# Patient Record
Sex: Female | Born: 1945 | Race: White | Hispanic: No | Smoking: Current every day smoker
Health system: Southern US, Community
[De-identification: ages and names within clinical notes are randomized; demographics above are authoritative.]

## PROBLEM LIST (undated history)

## (undated) DIAGNOSIS — M549 Dorsalgia, unspecified: Secondary | ICD-10-CM

## (undated) DIAGNOSIS — J42 Unspecified chronic bronchitis: Secondary | ICD-10-CM

## (undated) DIAGNOSIS — G8929 Other chronic pain: Secondary | ICD-10-CM

## (undated) DIAGNOSIS — M1990 Unspecified osteoarthritis, unspecified site: Secondary | ICD-10-CM

## (undated) DIAGNOSIS — R350 Frequency of micturition: Secondary | ICD-10-CM

## (undated) DIAGNOSIS — M797 Fibromyalgia: Secondary | ICD-10-CM

## (undated) DIAGNOSIS — IMO0002 Reserved for concepts with insufficient information to code with codable children: Secondary | ICD-10-CM

## (undated) DIAGNOSIS — Z9289 Personal history of other medical treatment: Secondary | ICD-10-CM

## (undated) DIAGNOSIS — F32A Depression, unspecified: Secondary | ICD-10-CM

## (undated) DIAGNOSIS — J449 Chronic obstructive pulmonary disease, unspecified: Secondary | ICD-10-CM

## (undated) DIAGNOSIS — G4700 Insomnia, unspecified: Secondary | ICD-10-CM

## (undated) DIAGNOSIS — M542 Cervicalgia: Secondary | ICD-10-CM

## (undated) DIAGNOSIS — I10 Essential (primary) hypertension: Secondary | ICD-10-CM

## (undated) DIAGNOSIS — E039 Hypothyroidism, unspecified: Secondary | ICD-10-CM

## (undated) DIAGNOSIS — Z87440 Personal history of urinary (tract) infections: Secondary | ICD-10-CM

## (undated) DIAGNOSIS — E119 Type 2 diabetes mellitus without complications: Secondary | ICD-10-CM

## (undated) DIAGNOSIS — J410 Simple chronic bronchitis: Secondary | ICD-10-CM

## (undated) DIAGNOSIS — U099 Post covid-19 condition, unspecified: Secondary | ICD-10-CM

## (undated) DIAGNOSIS — G629 Polyneuropathy, unspecified: Secondary | ICD-10-CM

## (undated) DIAGNOSIS — J189 Pneumonia, unspecified organism: Secondary | ICD-10-CM

## (undated) DIAGNOSIS — E785 Hyperlipidemia, unspecified: Secondary | ICD-10-CM

## (undated) DIAGNOSIS — F329 Major depressive disorder, single episode, unspecified: Secondary | ICD-10-CM

## (undated) DIAGNOSIS — M329 Systemic lupus erythematosus, unspecified: Secondary | ICD-10-CM

## (undated) DIAGNOSIS — J45909 Unspecified asthma, uncomplicated: Secondary | ICD-10-CM

## (undated) HISTORY — PX: ESOPHAGOGASTRODUODENOSCOPY: SHX1529

## (undated) HISTORY — PX: MOUTH SURGERY: SHX715

## (undated) HISTORY — DX: Post covid-19 condition, unspecified: U09.9

## (undated) HISTORY — PX: COLONOSCOPY: SHX174

## (undated) HISTORY — PX: CATARACT EXTRACTION: SUR2

---
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
## 1972-03-05 HISTORY — PX: BLADDER SUSPENSION: SHX72

## 1972-03-05 HISTORY — PX: VAGINAL HYSTERECTOMY: SUR661

## 1975-03-06 HISTORY — PX: BLADDER SURGERY: SHX569

## 1978-11-04 HISTORY — PX: BUNIONECTOMY: SHX129

## 1998-03-05 HISTORY — PX: RECTOCELE REPAIR: SHX761

## 1998-04-21 ENCOUNTER — Encounter: Payer: Self-pay | Admitting: Emergency Medicine

## 1998-04-21 ENCOUNTER — Emergency Department (HOSPITAL_COMMUNITY): Admission: EM | Admit: 1998-04-21 | Discharge: 1998-04-21 | Payer: Self-pay | Admitting: Emergency Medicine

## 1998-05-18 ENCOUNTER — Inpatient Hospital Stay (HOSPITAL_COMMUNITY): Admission: EM | Admit: 1998-05-18 | Discharge: 1998-05-20 | Payer: Self-pay | Admitting: Emergency Medicine

## 1998-05-18 ENCOUNTER — Encounter: Payer: Self-pay | Admitting: Emergency Medicine

## 1999-03-06 HISTORY — PX: BACK SURGERY: SHX140

## 2000-04-04 ENCOUNTER — Other Ambulatory Visit: Admission: RE | Admit: 2000-04-04 | Discharge: 2000-04-04 | Payer: Self-pay | Admitting: Gynecology

## 2000-06-05 ENCOUNTER — Encounter: Payer: Self-pay | Admitting: Internal Medicine

## 2000-06-05 ENCOUNTER — Encounter: Admission: RE | Admit: 2000-06-05 | Discharge: 2000-06-05 | Payer: Self-pay | Admitting: Internal Medicine

## 2000-06-27 ENCOUNTER — Observation Stay (HOSPITAL_COMMUNITY): Admission: RE | Admit: 2000-06-27 | Discharge: 2000-06-28 | Payer: Self-pay | Admitting: Neurosurgery

## 2000-08-02 ENCOUNTER — Encounter: Admission: RE | Admit: 2000-08-02 | Discharge: 2000-08-02 | Payer: Self-pay | Admitting: Neurosurgery

## 2000-10-04 ENCOUNTER — Encounter: Admission: RE | Admit: 2000-10-04 | Discharge: 2000-10-04 | Payer: Self-pay | Admitting: Neurosurgery

## 2000-10-14 ENCOUNTER — Ambulatory Visit (HOSPITAL_COMMUNITY): Admission: RE | Admit: 2000-10-14 | Discharge: 2000-10-14 | Payer: Self-pay | Admitting: Neurosurgery

## 2002-01-12 ENCOUNTER — Observation Stay (HOSPITAL_COMMUNITY): Admission: RE | Admit: 2002-01-12 | Discharge: 2002-01-13 | Payer: Self-pay | Admitting: Specialist

## 2002-01-12 ENCOUNTER — Encounter: Payer: Self-pay | Admitting: Specialist

## 2002-02-04 ENCOUNTER — Encounter: Admission: RE | Admit: 2002-02-04 | Discharge: 2002-05-05 | Payer: Self-pay | Admitting: Internal Medicine

## 2004-03-05 ENCOUNTER — Emergency Department (HOSPITAL_COMMUNITY): Admission: EM | Admit: 2004-03-05 | Discharge: 2004-03-05 | Payer: Self-pay | Admitting: Emergency Medicine

## 2004-03-25 ENCOUNTER — Emergency Department (HOSPITAL_COMMUNITY): Admission: EM | Admit: 2004-03-25 | Discharge: 2004-03-25 | Payer: Self-pay | Admitting: Emergency Medicine

## 2005-11-23 ENCOUNTER — Encounter: Admission: RE | Admit: 2005-11-23 | Discharge: 2005-11-23 | Payer: Self-pay | Admitting: Internal Medicine

## 2005-12-12 ENCOUNTER — Encounter: Admission: RE | Admit: 2005-12-12 | Discharge: 2005-12-12 | Payer: Self-pay | Admitting: Internal Medicine

## 2006-03-05 HISTORY — PX: CARDIAC CATHETERIZATION: SHX172

## 2006-04-26 ENCOUNTER — Emergency Department (HOSPITAL_COMMUNITY): Admission: EM | Admit: 2006-04-26 | Discharge: 2006-04-26 | Payer: Self-pay | Admitting: Emergency Medicine

## 2006-05-23 ENCOUNTER — Ambulatory Visit (HOSPITAL_COMMUNITY): Admission: RE | Admit: 2006-05-23 | Discharge: 2006-05-23 | Payer: Self-pay | Admitting: Gastroenterology

## 2006-06-20 ENCOUNTER — Ambulatory Visit (HOSPITAL_COMMUNITY): Admission: RE | Admit: 2006-06-20 | Discharge: 2006-06-20 | Payer: Self-pay | Admitting: Gastroenterology

## 2006-09-07 ENCOUNTER — Ambulatory Visit: Payer: Self-pay | Admitting: Cardiology

## 2006-09-07 ENCOUNTER — Inpatient Hospital Stay (HOSPITAL_COMMUNITY): Admission: EM | Admit: 2006-09-07 | Discharge: 2006-09-09 | Payer: Self-pay | Admitting: Emergency Medicine

## 2006-09-24 ENCOUNTER — Ambulatory Visit: Payer: Self-pay | Admitting: Internal Medicine

## 2006-10-01 ENCOUNTER — Ambulatory Visit: Payer: Self-pay | Admitting: Cardiology

## 2006-11-27 ENCOUNTER — Encounter: Admission: RE | Admit: 2006-11-27 | Discharge: 2006-11-27 | Payer: Self-pay | Admitting: Internal Medicine

## 2007-02-01 ENCOUNTER — Inpatient Hospital Stay (HOSPITAL_COMMUNITY): Admission: EM | Admit: 2007-02-01 | Discharge: 2007-02-03 | Payer: Self-pay | Admitting: Emergency Medicine

## 2007-03-06 HISTORY — PX: LUMBAR FUSION: SHX111

## 2007-03-27 ENCOUNTER — Ambulatory Visit: Payer: Self-pay | Admitting: Internal Medicine

## 2007-04-22 ENCOUNTER — Ambulatory Visit (HOSPITAL_COMMUNITY): Admission: RE | Admit: 2007-04-22 | Discharge: 2007-04-22 | Payer: Self-pay | Admitting: Neurosurgery

## 2007-05-07 ENCOUNTER — Inpatient Hospital Stay (HOSPITAL_COMMUNITY): Admission: RE | Admit: 2007-05-07 | Discharge: 2007-05-10 | Payer: Self-pay | Admitting: Neurosurgery

## 2007-11-28 ENCOUNTER — Encounter: Admission: RE | Admit: 2007-11-28 | Discharge: 2007-11-28 | Payer: Self-pay | Admitting: Internal Medicine

## 2008-06-07 ENCOUNTER — Inpatient Hospital Stay (HOSPITAL_COMMUNITY): Admission: EM | Admit: 2008-06-07 | Discharge: 2008-06-09 | Payer: Self-pay | Admitting: Emergency Medicine

## 2008-06-14 ENCOUNTER — Ambulatory Visit: Payer: Self-pay | Admitting: Pulmonary Disease

## 2008-06-14 DIAGNOSIS — E785 Hyperlipidemia, unspecified: Secondary | ICD-10-CM | POA: Insufficient documentation

## 2008-06-14 DIAGNOSIS — J309 Allergic rhinitis, unspecified: Secondary | ICD-10-CM | POA: Insufficient documentation

## 2008-06-14 DIAGNOSIS — I10 Essential (primary) hypertension: Secondary | ICD-10-CM | POA: Insufficient documentation

## 2008-06-14 DIAGNOSIS — J209 Acute bronchitis, unspecified: Secondary | ICD-10-CM | POA: Insufficient documentation

## 2008-06-14 DIAGNOSIS — E119 Type 2 diabetes mellitus without complications: Secondary | ICD-10-CM | POA: Insufficient documentation

## 2008-06-14 DIAGNOSIS — M329 Systemic lupus erythematosus, unspecified: Secondary | ICD-10-CM | POA: Insufficient documentation

## 2008-06-25 DIAGNOSIS — D72829 Elevated white blood cell count, unspecified: Secondary | ICD-10-CM | POA: Insufficient documentation

## 2008-06-25 DIAGNOSIS — J449 Chronic obstructive pulmonary disease, unspecified: Secondary | ICD-10-CM | POA: Insufficient documentation

## 2008-06-25 DIAGNOSIS — K3184 Gastroparesis: Secondary | ICD-10-CM | POA: Insufficient documentation

## 2008-06-25 DIAGNOSIS — J4489 Other specified chronic obstructive pulmonary disease: Secondary | ICD-10-CM | POA: Insufficient documentation

## 2008-07-13 ENCOUNTER — Encounter: Payer: Self-pay | Admitting: Pulmonary Disease

## 2008-08-05 ENCOUNTER — Ambulatory Visit: Payer: Self-pay | Admitting: Pulmonary Disease

## 2008-08-05 ENCOUNTER — Telehealth (INDEPENDENT_AMBULATORY_CARE_PROVIDER_SITE_OTHER): Payer: Self-pay | Admitting: *Deleted

## 2008-08-05 DIAGNOSIS — J438 Other emphysema: Secondary | ICD-10-CM | POA: Insufficient documentation

## 2008-11-24 ENCOUNTER — Ambulatory Visit (HOSPITAL_COMMUNITY): Admission: RE | Admit: 2008-11-24 | Discharge: 2008-11-24 | Payer: Self-pay | Admitting: Neurosurgery

## 2008-12-08 ENCOUNTER — Encounter: Admission: RE | Admit: 2008-12-08 | Discharge: 2008-12-08 | Payer: Self-pay | Admitting: Internal Medicine

## 2009-08-02 ENCOUNTER — Ambulatory Visit (HOSPITAL_COMMUNITY)
Admission: RE | Admit: 2009-08-02 | Discharge: 2009-08-02 | Payer: Self-pay | Source: Home / Self Care | Admitting: Neurosurgery

## 2010-03-05 DIAGNOSIS — J189 Pneumonia, unspecified organism: Secondary | ICD-10-CM

## 2010-03-05 HISTORY — DX: Pneumonia, unspecified organism: J18.9

## 2010-04-13 ENCOUNTER — Inpatient Hospital Stay (HOSPITAL_COMMUNITY)
Admission: EM | Admit: 2010-04-13 | Discharge: 2010-04-15 | DRG: 192 | Disposition: A | Discharge status: Home / Self Care | Payer: Medicare Other | Attending: Internal Medicine | Admitting: Internal Medicine

## 2010-04-13 ENCOUNTER — Emergency Department (HOSPITAL_COMMUNITY): Payer: Medicare Other

## 2010-04-13 DIAGNOSIS — M329 Systemic lupus erythematosus, unspecified: Secondary | ICD-10-CM | POA: Diagnosis present

## 2010-04-13 DIAGNOSIS — F17200 Nicotine dependence, unspecified, uncomplicated: Secondary | ICD-10-CM | POA: Diagnosis present

## 2010-04-13 DIAGNOSIS — J209 Acute bronchitis, unspecified: Principal | ICD-10-CM | POA: Diagnosis present

## 2010-04-13 DIAGNOSIS — J440 Chronic obstructive pulmonary disease with acute lower respiratory infection: Principal | ICD-10-CM | POA: Diagnosis present

## 2010-04-13 DIAGNOSIS — Z888 Allergy status to other drugs, medicaments and biological substances status: Secondary | ICD-10-CM

## 2010-04-13 DIAGNOSIS — E039 Hypothyroidism, unspecified: Secondary | ICD-10-CM | POA: Diagnosis present

## 2010-04-13 DIAGNOSIS — E119 Type 2 diabetes mellitus without complications: Secondary | ICD-10-CM | POA: Diagnosis present

## 2010-04-13 DIAGNOSIS — E785 Hyperlipidemia, unspecified: Secondary | ICD-10-CM | POA: Diagnosis present

## 2010-04-13 DIAGNOSIS — Z880 Allergy status to penicillin: Secondary | ICD-10-CM

## 2010-04-13 LAB — GLUCOSE, CAPILLARY
Glucose-Capillary: 249 mg/dL — ABNORMAL HIGH (ref 70–99)
Glucose-Capillary: 285 mg/dL — ABNORMAL HIGH (ref 70–99)

## 2010-04-13 LAB — CBC
HCT: 42.1 % (ref 36.0–46.0)
Hemoglobin: 14.7 g/dL (ref 12.0–15.0)
MCH: 31.4 pg (ref 26.0–34.0)
MCHC: 34.9 g/dL (ref 30.0–36.0)
MCV: 90 fL (ref 78.0–100.0)
Platelets: 244 10*3/uL (ref 150–400)
RBC: 4.68 MIL/uL (ref 3.87–5.11)
RDW: 12.8 % (ref 11.5–15.5)
WBC: 9.7 10*3/uL (ref 4.0–10.5)

## 2010-04-13 LAB — BASIC METABOLIC PANEL
BUN: 14 mg/dL (ref 6–23)
CO2: 28 mEq/L (ref 19–32)
Calcium: 9.1 mg/dL (ref 8.4–10.5)
Chloride: 97 mEq/L (ref 96–112)
Creatinine, Ser: 0.87 mg/dL (ref 0.4–1.2)
GFR calc Af Amer: >60 mL/min (ref >60)
GFR calc non Af Amer: >60 mL/min (ref >60)
Glucose, Bld: 196 mg/dL — ABNORMAL HIGH (ref 70–99)
Potassium: 3.8 mEq/L (ref 3.5–5.1)
Sodium: 136 mEq/L (ref 135–145)

## 2010-04-13 LAB — DIFFERENTIAL
Basophils Absolute: 0.1 10*3/uL (ref 0.0–0.1)
Basophils Relative: 1 % (ref 0–1)
Eosinophils Absolute: 0.3 10*3/uL (ref 0.0–0.7)
Eosinophils Relative: 3 % (ref 0–5)
Lymphocytes Relative: 21 % (ref 12–46)
Lymphs Abs: 2.1 10*3/uL (ref 0.7–4.0)
Monocytes Absolute: 0.4 10*3/uL (ref 0.1–1.0)
Monocytes Relative: 4 % (ref 3–12)
Neutro Abs: 6.8 10*3/uL (ref 1.7–7.7)
Neutrophils Relative %: 71 % (ref 43–77)

## 2010-04-14 LAB — DIFFERENTIAL
Basophils Absolute: 0 10*3/uL (ref 0.0–0.1)
Basophils Relative: 0 % (ref 0–1)
Eosinophils Absolute: 0 10*3/uL (ref 0.0–0.7)
Eosinophils Relative: 0 % (ref 0–5)
Lymphocytes Relative: 5 % — ABNORMAL LOW (ref 12–46)
Lymphs Abs: 0.8 10*3/uL (ref 0.7–4.0)
Monocytes Absolute: 0.2 10*3/uL (ref 0.1–1.0)
Monocytes Relative: 1 % — ABNORMAL LOW (ref 3–12)
Neutro Abs: 14.9 10*3/uL — ABNORMAL HIGH (ref 1.7–7.7)
Neutrophils Relative %: 94 % — ABNORMAL HIGH (ref 43–77)

## 2010-04-14 LAB — CBC
HCT: 38.4 % (ref 36.0–46.0)
Hemoglobin: 13.6 g/dL (ref 12.0–15.0)
MCH: 31.9 pg (ref 26.0–34.0)
MCHC: 35.4 g/dL (ref 30.0–36.0)
MCV: 90.1 fL (ref 78.0–100.0)
Platelets: 197 10*3/uL (ref 150–400)
RBC: 4.26 MIL/uL (ref 3.87–5.11)
RDW: 12.9 % (ref 11.5–15.5)
WBC: 15.9 10*3/uL — ABNORMAL HIGH (ref 4.0–10.5)

## 2010-04-14 LAB — BASIC METABOLIC PANEL
BUN: 16 mg/dL (ref 6–23)
CO2: 22 mEq/L (ref 19–32)
Calcium: 9.1 mg/dL (ref 8.4–10.5)
Chloride: 100 mEq/L (ref 96–112)
Creatinine, Ser: 0.92 mg/dL (ref 0.4–1.2)
GFR calc Af Amer: >60 mL/min (ref >60)
GFR calc non Af Amer: >60 mL/min (ref >60)
Glucose, Bld: 226 mg/dL — ABNORMAL HIGH (ref 70–99)
Potassium: 4.6 mEq/L (ref 3.5–5.1)
Sodium: 135 mEq/L (ref 135–145)

## 2010-04-14 LAB — GLUCOSE, CAPILLARY
Glucose-Capillary: 227 mg/dL — ABNORMAL HIGH (ref 70–99)
Glucose-Capillary: 149 mg/dL — ABNORMAL HIGH (ref 70–99)
Glucose-Capillary: 118 mg/dL — ABNORMAL HIGH (ref 70–99)
Glucose-Capillary: 191 mg/dL — ABNORMAL HIGH (ref 70–99)

## 2010-04-14 LAB — HEMOGLOBIN A1C
Hgb A1c MFr Bld: 6.4 % — ABNORMAL HIGH (ref <5.7)
Mean Plasma Glucose: 137 mg/dL — ABNORMAL HIGH (ref <117)

## 2010-04-15 LAB — BASIC METABOLIC PANEL
BUN: 21 mg/dL (ref 6–23)
CO2: 27 mEq/L (ref 19–32)
Calcium: 9 mg/dL (ref 8.4–10.5)
Chloride: 99 mEq/L (ref 96–112)
Creatinine, Ser: 0.86 mg/dL (ref 0.4–1.2)
GFR calc Af Amer: >60 mL/min (ref >60)
GFR calc non Af Amer: >60 mL/min (ref >60)
Glucose, Bld: 187 mg/dL — ABNORMAL HIGH (ref 70–99)
Potassium: 4.4 mEq/L (ref 3.5–5.1)
Sodium: 137 mEq/L (ref 135–145)

## 2010-04-15 LAB — GLUCOSE, CAPILLARY: Glucose-Capillary: 190 mg/dL — ABNORMAL HIGH (ref 70–99)

## 2010-04-30 NOTE — Discharge Summary (Signed)
  [name], [name]          ACCOUNT NO.:  [id number]  MEDICAL RECORD NO.:  [id number]           PATIENT TYPE:  I  LOCATION:  A315                          FACILITY:  [facility]  PHYSICIAN:  Felise Georgia L. Lendell [name], MDDATE OF BIRTH:  Feb 07, 1946  DATE OF ADMISSION:  04/13/2010 DATE OF DISCHARGE:  02/11/2012LH                              DISCHARGE SUMMARY   DIAGNOSES: 1. Chronic obstructive pulmonary disease exacerbation secondary to     acute bronchitis. 2. Continued tobacco abuse, counseled against. 3. Type 2 diabetes. 4. Lupus. 5. Hyperlipidemia. 6. Hypothyroidism.  DISCHARGE MEDICATIONS: 1. Nicotine patch 21 mg daily then titrate as tolerated. 2. Doxycycline 100 mg p.o. b.i.d. until gone. 3. Methylprednisolone 4 mg tablets 6 tablets on day 1, then decreased     by 1 tablet daily until off. 4. Simvastatin 20 mg a day. 5. Janumet 50/1000 mg daily. 6. Triamterene/hydrochlorothiazide 37.5/25 mg daily. 7. Levothyroxine 112 mcg a day. 8. Sertraline 100 mg a day. 9. Zolpidem 5 mg nightly. 10.Hydromorphone 4 mg p.o. q.4 h. p.r.n. pain. 11.Etodolac 400 mg p.o. b.i.d. 12.Albuterol inhaler 1 puff q.4 h. p.r.n. wheezing. 13.Cefdinir 300 mg p.o. b.i.d. until gone.  CONDITION:  Stable.  ACTIVITY:  Increased slowly.  CONSULTATIONS:  None.  PROCEDURES:  None.  FOLLOWUP:  With Dr. Renae [name] if symptoms worsen.  DIET:  Should be diabetic heart-healthy.  LABORATORY DATA:  Sputum gram stain and culture were ordered, but have not been collected.  CBC normal.  Basic metabolic panel on admission significant for a glucose of 196, otherwise negative.  BNP 6.4.  DIAGNOSTICS:  Chest x-ray shows no infiltrate, mild hyperinflation.  HISTORY AND HOSPITAL COURSE:  Please see H and P for details.  [name] is a 65 year old smoker with lupus and COPD who was sent to the emergency room by her primary care physician for non improvement of her bronchitis and wheezing.  She has been on 2  courses of Avelox, 7 days each and she has bronchodilators at home.  She continues to smoke a pack of cigarettes a day and has tried Chantix and other measures in the past on successfully.  The patient had unremarkable vital signs.  She was able to speak in full sentences and was watching TV and comfortable on admission.  She did, however, have diffuse bronchospasm, but good air movement.  The patient was admitted as she had failed outpatient treatment and was started on Solu-Medrol, vancomycin and cefepime.  She reports multiple allergies including PREDNISONE.  The patient improved within a day and was transitioned to Medrol, doxycycline and Ceftin without any reactions.  She has been ambulating the halls.  She has completely clear lung sounds.  She does wish to quit smoking and we talked about nicotine replacement therapies.  She is discharged home in stable condition.     [name] Thorpe L. Lendell Caprice, MD     CLS/MEDQ  D:  04/15/2010  T:  04/15/2010  Job:  [id number]  cc:   [name], M.D. Fax: [phone]  Electronically Signed by Crista Curb MD on 04/30/2010 09:24:25 PM

## 2010-04-30 NOTE — H&P (Signed)
[name], [name]          ACCOUNT NO.:  [id number]  MEDICAL RECORD NO.:  [id number]           PATIENT TYPE:  I  LOCATION:  A315                          FACILITY:  [facility]  PHYSICIAN:  Adalei Novell L. Lendell Caprice, MDDATE OF BIRTH:  January 25, 1946  DATE OF ADMISSION:  04/13/2010 DATE OF DISCHARGE:  LH                             HISTORY & PHYSICAL   CHIEF COMPLAINT:  Bronchitis.  HISTORY OF PRESENT ILLNESS:  [name] is a 65 year old white female with a history of COPD who was instructed to come to the emergency room by her primary care physician.  She has been sick for several weeks. She had a regular checkup a few weeks ago, and had a cough and wheezing at that time.  She has multiple drug allergies and intolerances.  She was started on Avelox at that time.  She got a little better, then relapsed.  The second episode had sinusitis.  She was placed back on Avelox for another 7 days as well as an unknown nasal inhaledmedication.  She reports that her sinusitis is better but that she has had continued cough, shortness of breath, poor appetite, and malaise. She has a history of lupus.  She continues to smoke a pack of cigarettes a day.  She reports an intolerance to prednisone which causes muscle jerking.  She has had no fevers, chills, or rash.  PAST MEDICAL HISTORY:  COPD, diabetes, lupus, hyperlipidemia, hypertension, hypothyroidism, and reported history of gastroparesis.  MEDICATIONS:  According to the triage notes include Janumet 50/1000 mg twice a day, etodolac 400 mg p.o. b.i.d., levothyroxine 112 mcg a day, triamterene/hydrochlorothiazide 37.5/25 mg a day, simvastatin 20 mg a day, multivitamin a day, zolpidem 5 mg nightly, sertraline 100 mg a day, albuterol inhaler as needed, Dilaudid 4 mg as needed.  ALLERGIES:  She reports an allergy to PENICILLIN and ERYTHROMYCIN which causes shortness of breath.  Unknown reaction to SULFA.  Vomiting with CODEINE.  CONTRAST MEDIA causes  rash.  DIAZEPAM causes agitation. PREDNISONE causes muscle jerks.  ASPIRIN unknown reaction.  SOCIAL HISTORY:  The patient continues to smoke a pack of cigarettes a day.  She denies drug use or heavy drinking.  She lives in [place] with her husband.  She does not work currently.  FAMILY HISTORY:  Significant for an autoimmune disorder in her son, lupus in her aunt.  REVIEW OF SYSTEMS:  Systems reviewed and as above otherwise negative.  PHYSICAL EXAMINATION:  VITAL SIGNS:  Temperature is 98 degrees, blood pressure 107/58, pulse 61, respiratory rate 20, and oxygen saturation 98% on room air. GENERAL:  The patient is well-nourished, well-developed in no acute distress but with periods of wet cough.  She can speak in full sentences. HEENT:  Normocephalic and atraumatic.  Pupils equal, round, reactive to light.  Sclerae nonicteric.  Moist mucous membranes. NECK:  Supple.  She has this shoddy submandibular lymphadenopathy. LUNGS:  Prolonged expiratory phase.  Diffuse expiratory wheezes but good air movement. CARDIOVASCULAR:  Regular rate and rhythm without murmurs, gallops, or rubs. ABDOMEN:  Normal bowel sounds, soft, nontender, and nondistended. GU AND RECTAL:  Deferred. EXTREMITIES:  No clubbing, cyanosis, or edema. SKIN:  No rash. PSYCHIATRIC:  Normal affect. NEUROLOGIC:  Alert and oriented.  Cranial nerves and sensorimotor exam are intact.  LABORATORY DATA:  Labs, CBC is normal.  Basic metabolic panel is significant for a glucose of 196.  Chest x-ray shows hyperinflation and nothing acute.  ASSESSMENT/PLAN: 1. Acute bronchitis with chronic obstructive pulmonary disease     exacerbation, failed outpatient treatment:  The patient is     immunosuppressed from her lupus and is at risk for Pseudomonas or     MRSA.  I will place her on vancomycin and cefepime for now.  She     has received a bronchodilator and Solu-Medrol which I will     continue.  She looks relatively stable.   I will admit her to the     floor.  I will order sputum Gram stain and culture.  She is     encouraged to quit smoking.  She will get a nicotine patch. 2. Type 2 diabetes:  I will give sliding scale and monitor blood     glucose while on steroids.  Also check a hemoglobin A1c. 3. Hypertension.  Continue outpatient medications. 4. Lupus.  Continue etodolac. 5. Hyperlipidemia.  Continue simvastatin.     [name] L. Lendell Caprice, MD     CLS/MEDQ  D:  04/13/2010  T:  04/14/2010  Job:  [id number]  cc:   [name]. Renae Gloss, M.D. Fax: [phone]  Electronically Signed by Crista Curb MD on 04/30/2010 09:24:29 PM

## 2010-05-22 LAB — GLUCOSE, CAPILLARY
Glucose-Capillary: 139 mg/dL — ABNORMAL HIGH (ref 70–99)
Glucose-Capillary: 185 mg/dL — ABNORMAL HIGH (ref 70–99)

## 2010-06-09 LAB — CREATININE, SERUM
Creatinine, Ser: 0.69 mg/dL (ref 0.4–1.2)
GFR calc Af Amer: >60 mL/min (ref >60)
GFR calc non Af Amer: >60 mL/min (ref >60)

## 2010-06-14 LAB — BASIC METABOLIC PANEL
BUN: 16 mg/dL (ref 6–23)
BUN: 21 mg/dL (ref 6–23)
BUN: 23 mg/dL (ref 6–23)
CO2: 26 mEq/L (ref 19–32)
CO2: 26 mEq/L (ref 19–32)
CO2: 26 mEq/L (ref 19–32)
Calcium: 8.8 mg/dL (ref 8.4–10.5)
Calcium: 8.9 mg/dL (ref 8.4–10.5)
Calcium: 9.4 mg/dL (ref 8.4–10.5)
Chloride: 96 mEq/L (ref 96–112)
Chloride: 97 mEq/L (ref 96–112)
Chloride: 99 mEq/L (ref 96–112)
Creatinine, Ser: 0.72 mg/dL (ref 0.4–1.2)
Creatinine, Ser: 0.96 mg/dL (ref 0.4–1.2)
Creatinine, Ser: 1.28 mg/dL — ABNORMAL HIGH (ref 0.4–1.2)
GFR calc Af Amer: 51 mL/min — ABNORMAL LOW (ref >60)
GFR calc Af Amer: >60 mL/min (ref >60)
GFR calc Af Amer: >60 mL/min (ref >60)
GFR calc non Af Amer: 42 mL/min — ABNORMAL LOW (ref >60)
GFR calc non Af Amer: 59 mL/min — ABNORMAL LOW (ref >60)
GFR calc non Af Amer: >60 mL/min (ref >60)
Glucose, Bld: 106 mg/dL — ABNORMAL HIGH (ref 70–99)
Glucose, Bld: 286 mg/dL — ABNORMAL HIGH (ref 70–99)
Glucose, Bld: 300 mg/dL — ABNORMAL HIGH (ref 70–99)
Potassium: 3.3 mEq/L — ABNORMAL LOW (ref 3.5–5.1)
Potassium: 4.2 mEq/L (ref 3.5–5.1)
Potassium: 4.5 mEq/L (ref 3.5–5.1)
Sodium: 132 mEq/L — ABNORMAL LOW (ref 135–145)
Sodium: 134 mEq/L — ABNORMAL LOW (ref 135–145)
Sodium: 136 mEq/L (ref 135–145)

## 2010-06-14 LAB — DIFFERENTIAL
Basophils Absolute: 0 10*3/uL (ref 0.0–0.1)
Basophils Absolute: 0 10*3/uL (ref 0.0–0.1)
Basophils Absolute: 0 10*3/uL (ref 0.0–0.1)
Basophils Relative: 0 % (ref 0–1)
Basophils Relative: 0 % (ref 0–1)
Basophils Relative: 0 % (ref 0–1)
Eosinophils Absolute: 0 10*3/uL (ref 0.0–0.7)
Eosinophils Absolute: 0 10*3/uL (ref 0.0–0.7)
Eosinophils Absolute: 0.3 10*3/uL (ref 0.0–0.7)
Eosinophils Relative: 0 % (ref 0–5)
Eosinophils Relative: 0 % (ref 0–5)
Eosinophils Relative: 3 % (ref 0–5)
Lymphocytes Relative: 28 % (ref 12–46)
Lymphocytes Relative: 5 % — ABNORMAL LOW (ref 12–46)
Lymphocytes Relative: 6 % — ABNORMAL LOW (ref 12–46)
Lymphs Abs: 0.6 10*3/uL — ABNORMAL LOW (ref 0.7–4.0)
Lymphs Abs: 1 10*3/uL (ref 0.7–4.0)
Lymphs Abs: 2.5 10*3/uL (ref 0.7–4.0)
Monocytes Absolute: 0.1 10*3/uL (ref 0.1–1.0)
Monocytes Absolute: 0.4 10*3/uL (ref 0.1–1.0)
Monocytes Absolute: 0.7 10*3/uL (ref 0.1–1.0)
Monocytes Relative: 1 % — ABNORMAL LOW (ref 3–12)
Monocytes Relative: 2 % — ABNORMAL LOW (ref 3–12)
Monocytes Relative: 8 % (ref 3–12)
Neutro Abs: 10 10*3/uL — ABNORMAL HIGH (ref 1.7–7.7)
Neutro Abs: 18.6 10*3/uL — ABNORMAL HIGH (ref 1.7–7.7)
Neutro Abs: 5.5 10*3/uL (ref 1.7–7.7)
Neutrophils Relative %: 61 % (ref 43–77)
Neutrophils Relative %: 92 % — ABNORMAL HIGH (ref 43–77)
Neutrophils Relative %: 94 % — ABNORMAL HIGH (ref 43–77)

## 2010-06-14 LAB — CBC
HCT: 36.4 % (ref 36.0–46.0)
HCT: 36.9 % (ref 36.0–46.0)
HCT: 41 % (ref 36.0–46.0)
Hemoglobin: 12.9 g/dL (ref 12.0–15.0)
Hemoglobin: 13.1 g/dL (ref 12.0–15.0)
Hemoglobin: 14.5 g/dL (ref 12.0–15.0)
MCHC: 35.3 g/dL (ref 30.0–36.0)
MCHC: 35.5 g/dL (ref 30.0–36.0)
MCHC: 35.6 g/dL (ref 30.0–36.0)
MCV: 94.2 fL (ref 78.0–100.0)
MCV: 94.7 fL (ref 78.0–100.0)
MCV: 95.3 fL (ref 78.0–100.0)
Platelets: 211 10*3/uL (ref 150–400)
Platelets: 235 10*3/uL (ref 150–400)
Platelets: 293 10*3/uL (ref 150–400)
RBC: 3.82 MIL/uL — ABNORMAL LOW (ref 3.87–5.11)
RBC: 3.89 MIL/uL (ref 3.87–5.11)
RBC: 4.35 MIL/uL (ref 3.87–5.11)
RDW: 12.2 % (ref 11.5–15.5)
RDW: 12.4 % (ref 11.5–15.5)
RDW: 12.8 % (ref 11.5–15.5)
WBC: 10.7 10*3/uL — ABNORMAL HIGH (ref 4.0–10.5)
WBC: 20.2 10*3/uL — ABNORMAL HIGH (ref 4.0–10.5)
WBC: 9 10*3/uL (ref 4.0–10.5)

## 2010-06-14 LAB — GLUCOSE, CAPILLARY
Glucose-Capillary: 176 mg/dL — ABNORMAL HIGH (ref 70–99)
Glucose-Capillary: 196 mg/dL — ABNORMAL HIGH (ref 70–99)
Glucose-Capillary: 252 mg/dL — ABNORMAL HIGH (ref 70–99)
Glucose-Capillary: 264 mg/dL — ABNORMAL HIGH (ref 70–99)
Glucose-Capillary: 270 mg/dL — ABNORMAL HIGH (ref 70–99)
Glucose-Capillary: 284 mg/dL — ABNORMAL HIGH (ref 70–99)
Glucose-Capillary: 306 mg/dL — ABNORMAL HIGH (ref 70–99)
Glucose-Capillary: 88 mg/dL (ref 70–99)

## 2010-06-14 LAB — HEMOGLOBIN A1C
Hgb A1c MFr Bld: 6.2 % — ABNORMAL HIGH (ref 4.6–6.1)
Mean Plasma Glucose: 131 mg/dL

## 2010-07-18 NOTE — Assessment & Plan Note (Signed)
[facility]                            CARDIOLOGY OFFICE NOTE   NAME:[name], [name]                 MRN:          [id number]  DATE:03/27/2007                            DOB:          [birth year]    PRIMARY CARE PHYSICIAN:  [name], M.D.   INTERVAL HISTORY:  [name] is a very pleasant 65 -year-old woman  with a history diabetes, hypertension, hyperlipidemia, COPD with ongoing  tobacco use and gastroparesis.  She had a history of chest pain with  minimal coronary artery disease by catheterization.  This was last year.   She returns today routine follow-up.  Overall, she is doing fairly well.  Her main problem is back and neck pain and she is considering spinal  fusion.  She has not had any chest pain and her breathing is okay.  She  says that she is trying to quit smoking as of today.   CURRENT MEDICATIONS:  1. Domperidone.  2. Avandamet.  3. Sertraline.  4. Transferrin/hydrochlorothiazide.  5. Lipitor.  6. Advair.  7. Darvocet.   PHYSICAL EXAM:  She has no acute distress. Ambulates around the clinic  without respiratory difficulty.  Blood pressure is 132/78, heart rate  75, weight 159.  HEENT is normal.  NECK: Is supple.  There is no JVD.  Carotids are 2+ bilaterally without  any bruits.  There is no lymphadenopathy or thyromegaly.  CARDIAC:  PMI is nondisplaced.  She has a regular rate and rhythm.  No  murmurs, rubs or gallops.  LUNGS:  Clear with a prolonged expiratory phase, no wheeze.  ABDOMEN:  Soft, nontender, nondistended.  No hepatosplenomegaly, bruits  or masses appreciated.  EXTREMITIES: Warm with no cyanosis, clubbing or  edema.  No rash.  NEURO:  Alert and oriented x3.  Cranial nerves II-XII are intact.  Moves  all four extremities without difficulty.  Affect is pleasant.   ASSESSMENT/PLAN:  1. Chest pain with minimal nonobstructive coronary artery disease.      Would just continue risk factor  modification and once again      stressed absolute need for her to quit smoking.  2. Hypertension.  Blood pressure is mildly elevated today.  This was      followed by Dr. Renae [name].   DISPOSITION:  She can return to [place] on a p.r.n. basis.     Bevelyn Buckles. Bensimhon, MD  Electronically Signed    DRB/MedQ  DD: 03/27/2007  DT: 03/27/2007  Job #: [id number]   cc:   [name], M.D.

## 2010-07-18 NOTE — Group Therapy Note (Signed)
[name], [name]          ACCOUNT NO.:  [id number]   MEDICAL RECORD NO.:  [id number]          PATIENT TYPE:  INP   LOCATION:  A307                          FACILITY:  [facility]   PHYSICIAN:  Margaretmary Dys, M.D.DATE OF BIRTH:  [birth year]   DATE OF PROCEDURE:  06/08/2008  DATE OF DISCHARGE:                                 PROGRESS NOTE   SUBJECTIVE:  The patient feels slightly better today although she is  still having fair amount of cough productive of yellowish sputum.  She  has some chest pain but mostly due to coughing hard.  She denies any  fevers or chills at this time.  Sounds like she never really has any  fevers or chills.  She has a history of lupus erythematosus, SLE.   OBJECTIVE:  Conscious, alert, not in acute distress, pleasant.  VITAL SIGNS:  Blood pressure was 107/57, pulse of 77, respirations 20,  temperature 97 degrees Fahrenheit.  Oxygen saturation was 95% on room  air.  HEENT EXAM:  Normocephalic, atraumatic.  Oral mucosa was dry.  No  exudates were noted.  NECK:  Supple.  No JVD or lymphadenopathy.  LUNGS:  Showed bilateral diffuse wheezing with some rhonchi.  HEART:  S1, S2 regular.  No S3, S4, gallops or rubs.  ABDOMEN:  Soft, nontender.  Bowel sounds positive.  No masses palpable.  EXTREMITIES:  No edema.  No calf induration or tenderness was noted.  CNS:  Exam was grossly intact.  No focal neurological deficits.   LABORATORY/DIAGNOSTIC DATA:  White blood cell count 10.7, hemoglobin of  13, hematocrit 36.9, platelet count was 211 with 94% neutrophils.  Blood  sugars ranging between 88-52.  Sodium 134, potassium is 4.2, chloride of  99, CO2 was 26, glucose was 280, BUN of 21, creatinine was 1.28, calcium  is 8.9.   ASSESSMENT/PLAN:  1. Acute bronchitis but also suspect underlying chronic obstructive      pulmonary disease.  The patient has had frequent bronchitis and has      over a 50 pack year history of smoking.  2. Type 2 diabetes, poorly  controlled possibly from steroid therapy.  3. Hypertension.  4,  History of lupus.   PLAN:  1. We will continue on intravenous antibiotics with Avelox at this      time and also steroids and nebulizer therapy.  2. We will continue with sliding scale for her blood sugar control.      We will continue to follow up very closely.  3. Smoking cessation was again discussed with the patient.  4. We will continue all other current treatments.  Hopefully with some      improvement in her wheezing and      rhonchi, the patient may be discharged home in the next 1-2 days.  5. The patient remains on Domperidone for her gastroparesis.  This      drug is not available in the [place] and the patient is using      her own supplies.      Margaretmary Dys, M.D.  Electronically Signed     AM/MEDQ  D:  06/08/2008  T:  06/08/2008  Job:  [id number]

## 2010-07-18 NOTE — Op Note (Signed)
NAMEMARCEDES, TECH          ACCOUNT NO.:  [id number]   MEDICAL RECORD NO.:  [id number]          PATIENT TYPE:  INP   LOCATION:  3029                         FACILITY:  [facility]   PHYSICIAN:  Cristi Loron, M.D.DATE OF BIRTH:  1945-03-19   DATE OF PROCEDURE:  05/07/2007  DATE OF DISCHARGE:                               OPERATIVE REPORT   BRIEF HISTORY:  This patient is a 65 year old white female who has  suffered from back and leg pain consistent with neurogenic claudication.  She failed medical management, was worked up with a lumbar MRI which  demonstrated the patient had a spondylolisthesis at L4-5 with spinal  stenosis and disk degeneration.  I discussed the various treatment  options with the patient including surgery.  She has weighed the risks,  benefits, and alternatives of surgery and decided to proceed with the L4-  5 decompression and fusion.   PREOPERATIVE DIAGNOSES:  L4-5 grade 1 acquired spondylolisthesis,  degenerative disease, spinal stenosis, lumbar radiculopathy/myelopathy,  and lumbago.   POSTOPERATIVE DIAGNOSES:  L4-5 grade 1 acquired spondylolisthesis,  degenerative disease, spinal stenosis, lumbar radiculopathy/myelopathy,  and lumbago.   PROCEDURE:  L4 bilateral laminotomies to decompress the bilateral L4 and  L5 nerve roots; L4-5 transforaminal lumbar interbody fusion with local  autograft bone and  VITOSS bone graft extender; insertion of L4-5  interbody prosthesis (Capstone PEEK interbody prosthesis); L4-5  posterior nonsegmental instrumentation with legacy titanium pedicle  screws and rods; L4-5 posterolateral arthrodesis with local morselized  autograft bone and  VITOSS bone graft extender.   SURGEON:  Cristi Loron, M.D.   ASSISTANT:  Clydene Fake, M.D.   ANESTHESIA:  General endotracheal.   ESTIMATED BLOOD LOSS:  200 mL.   SPECIMEN:  None.   DRAINS:  None.   COMPLICATIONS:  None.   PROCEDURE:  The patient was brought to  operating room by the anesthesia  team.  General endotracheal anesthesia was induced.  The patient was  turned to the prone position on the Wilson frame.  The lumbosacral  region was then prepared with Betadine scrub and Betadine solution.  Sterile drapes were applied and then injected the area to be incised  with Marcaine with epinephrine solution and used the scalpel to make a  linear midline incision over the L4-5 interspace.  I used electrocautery  to perform a bilateral subperiosteal dissection exposing the spinous  process lamina of L3, L4, and L5.  We obtained intraoperative radiograph  to confirm our location.  We then inserted the Versatrac retractor for  exposure.  We began the decompression by using a high-speed drill to  perform bilateral L4 laminotomies.  We widened the laminotomies with  Kerrison punch and then I performed foraminotomies about the bilateral  L4 and L5 nerve roots.  We undercut the L4 lamina and removed the  cephalad aspect of the L5 lamina.  We got good decompression of the  thecal sac and bilateral L4-L5 nerve roots.  This decompression was in  excess of what was required to do the fusion because of the patient's  spondylolisthesis, spinal stenosis, and facet arthropathy.   Having completed the decompression,  we now turned our attention to the  arthrodesis.  We used a high-speed drill and Kerrison punch to remove  the right anterior facet of L4.  This gave [place] a wide lateral exposure of  the right L4-L5 intervertebral disk space.  We incised the disk with a  #15 blade scalpel, performed partial intervertebral facetectomy using  the pituitary forceps, and Epstein and Scoville curette.  In preparation  for the interbody fusion, we cleared off some soft tissue from the  vertebral endplates.  We then used the trial spacers and determined to  use a 12 x 26 mm Capstone PEEK interbody prosthesis.  We prefilled this  prosthesis with a combination of local autograft  bone we obtained in  order to give the decompression as well as VITOSS bone graft extender.  We then filled virtually the disk space with local bone and VITOSS.  We  placed a prosthesis in interspace and then turned it sideways using the  bone tamps of course after retracting the neural structures out of harms  way.  We then filled posterior end of the disk space with local bone and  VITOSS completing our right transforaminal lumbar interbody fusion.   We now turned our attention to the instrumentation.  Under fluoroscopic  guidance, we cannulated bilateral L4 and L5 pedicles with bone probes.  We tapped the pedicles with 5.5 mm tap.  We probed inside the pedicles  with a ball probe to rule out cortical breeches.  We then inserted 6.5 x  15 mm pedicle screws bilaterally at L4 and 5 under Fluoroscopic  guidance.  We palpated along the medial aspect of the bilateral L4 and  L5 pedicles rule out cortical breeches and noted that no nerve roots  were injured.  We then connected unilateral pedicle screws with lordotic  rod and we compressed the construct and secured the rod in place with  the caps which we tightened appropriately completing the  instrumentation.   We now turned our attention to the posterolateral arthrodesis.  We used  a high-speed drill to decorticate the remainder of the left L4-5 facet  and pars region as well as a bilateral transverse prosthesis at L4 and  L5.  We then laid combination of local autograft bone and  VITOSS used  to decorticate posterolateral structures.  This completed the  posterolateral arthrodesis.   We then obtained hemostasis using bipolar electrocautery, irrigated the  wound out with Bacitracin solution, removed the retractor.  We inspected  the thecal sac and bilateral L4-L5 nerve roots.  It was noted they were  well decompressed.  We then removed the retractor and then  reapproximated the patient's thoracolumbar fascia with interrupted #1  Vicryl  suture, the subcutaneous tissue with interrupted 2-0 Vicryl  suture and skin with Steri-Strips and Benzoin.  The wound was then  coated with bacitracin ointment and sterile dressings applied.  The  drapes were removed, and the patient was subsequently returned to the  supine position where she was extubated by anesthesia team and  transported to the postanesthesia care unit in stable condition.  All  sponge, instrument, and needle counts were correct at the end of the  case.      Cristi Loron, M.D.  Electronically Signed     JDJ/MEDQ  D:  05/07/2007  T:  05/08/2007  Job:  [id number]

## 2010-07-18 NOTE — Assessment & Plan Note (Signed)
[facility]                            CARDIOLOGY OFFICE NOTE   NAME:[name], [name]                 MRN:          [id number]  DATE:09/24/2006                            DOB:          1945-09-07    INTERVAL HISTORY:  Ms. Liston is a 65 year old woman with a history  of diabetes, hypertension, hyperlipidemia, and gastroparesis as well as  COPD with ongoing tobacco use.  She was admitted to [facility] with  chest pain.  About 2 weeks ago she underwent cardiac catheterization by  Dr. Antoine Poche which showed normal LV function with an EF of 65% and just  very mild diffuse coronary artery disease.  She returns today for post  hospital followup.  Since that time she continues to have chest pressure  several times a day.  This usually lasts a couple of hours.  There is no  change with exertion, no shortness of breath.  She denies any cough  associated with this and no reflux disease.  She says she feels somewhat  poorly as her sugars have been running high.  Unfortunately, she  continues to smoke four to five cigarettes per day.  She has not had any  leg swelling.   CURRENT MEDICATIONS:  1. Domperidone 10 mg q.i.d.  2. Avandamet 500 a day.  3. Sertraline 50 a day.  4. Triamterene/hydrochlorothiazide one tablet daily.  5. Lipitor 40 a day.  6. Reglan 5 mg b.i.d.  7. Advair 100/50 b.i.d.  8. Stool softener.   She has multiple drug allergies including:  1. BETA BLOCKERS.  2. PENICILLIN.  3. SULFA.  4. MYCINS.  5. CONTRAST DYE.   PHYSICAL EXAMINATION:  She is in no acute distress.  She ambulates  around the clinic without any respiratory difficulty.  Blood pressure is  116/64, heart rate 64, weight is 165.  HEENT:  Normal.  NECK:  Supple.  There is no JVD.  Carotids are 2+ bilaterally without  any bruits.  There is no lymphadenopathy or thyromegaly.  CARDIAC:  PMI is nondisplaced.  She has a regular rate and rhythm with  no obvious murmurs,  rubs or gallops.  LUNGS:  Clear with mildly decreased breath sounds.  No wheezes or rales.  ABDOMEN:  Obese, nondistended.  She has mild tenderness in epigastrium  and right upper quadrant with no rebound or guarding.  There is no  hepatosplenomegaly, no bruits, no masses, good bowel sounds.  EXTREMITIES:  Warm with no cyanosis, clubbing or edema.  Dorsalis pedis  pulses are 2+ bilaterally.  Her right groin - there is no bruit or  hematoma.  There is still some very mild ecchymosis.   ASSESSMENT AND PLAN:  Chest pain.  Given her essentially normal coronary  arteries, I doubt this is cardiac in nature.  I am wondering whether it  might be due to reflux disease.  However, given her history of tobacco  use she may also have an underlying chest process.  We will go ahead and  get a CT scan of the chest with and without contrast to further  evaluate, make sure she does not have  a PE or malignancy.  We will also  start Protonix 40 a day.  She has followup with Dr. Loreta Ave later this  week.  Disposition will be based on the result of her CT scan.     Bevelyn Buckles. Bensimhon, MD  Electronically Signed    DRB/MedQ  DD: 09/24/2006  DT: 09/25/2006  Job #: [id number]   cc:   Anselmo Rod, M.D.  [name]. Renae Gloss, M.D.

## 2010-07-18 NOTE — H&P (Signed)
[name], [name]          ACCOUNT NO.:  [id number]   MEDICAL RECORD NO.:  [id number]          PATIENT TYPE:  INP   LOCATION:  3703                         FACILITY:  [facility]   PHYSICIAN:  Learta Codding, MD,FACC DATE OF BIRTH:  March 15, 1945   DATE OF ADMISSION:  09/07/2006  DATE OF DISCHARGE:                              HISTORY & PHYSICAL   The patient is full code.  The patient was a good historian, total visit  time approximately 60 minutes.  The patient's primary care doctor is Dr.  Andi Devon.   CHIEF COMPLAINT:  Chest pain.   HISTORY OF PRESENT ILLNESS:  The patient is a 65 year old female with a  history of significant past for tobacco abuse who presents with chest  pain.  The patient notes apparently 3-1/2 weeks ago she had chest pain  and chest pressure intermittently in the center of her chest, radiating  to the back off-and-on for 2 days.  Not associated with shortness of  breath or sweats, but was having problems with gastroparesis at that  time.  She then accompanied her sister to Dr. Prescott Gum office, as Dr.  Gala Romney told her to have an appointment setup and it is setup for the  July 27.  The patient with a history of gastroparesis with significant  nausea has been on multiple medications to control this.  The patient  notes she has had workups for her heart in the past.  She was on phen  phen back in 2000 and had a stress test echocardiogram and left heart  cath, all of which were unremarkable.  With that workup she apparently  also had a Holter monitor placed.  The patient also had chest pain  syndrome in 2005, deemed esophageal spasm.  No stress test was done at  the time she notes.  The patient notes approximately 11:15 p.m. on  09/06/2006 sudden onset of chest pain while watching TV, midsternum,  substernal chest pain that got progressively worse to a max of 7/10,  associated with nausea.  It has radiated to the left arm, back, jaw, but  was not  associated with sweats or shortness of breath.  The patient took  her husband's nitroglycerin x2 which did not work initially, but then  subsided.  The patient's husband drove her 35 minutes from [place] to  [facility] emergency room.  The patient apparently smoked on the way  because she was stressed.  Chest pain by the time she got into the  emergency room was 0/10 before getting anything in the emergency room.  The patient was started on heparin in the ER.  The patient notes some  palpitations with the chest pain tonight.  She has had no episodes of  orthopnea or paroxysmal nocturnal dyspnea.  She notes severe leg  cramping which is longstanding.   PAST MEDICAL HISTORY:  History of lupus in which she gets a rash  occasionally for and she occasionally has to be on steroids for, history  of diabetes, medication controlled with no complications, history of  gastroparesis of uncertain etiology, history of depression, history of  degenerative joint disease, history of  hypertension since 2005, history  of possible bronchitis, COPD on albuterol and Advair.  She is status  post appendectomy.  She has a hysterectomy in the past after having had  three premature births and incompetent cervix.  She notes she is unsure  of the details of why she had her hysterectomy, but it was marginally  removed.  She has also had bladder surgery for urinary incontinence in  the past.  She also had a rectocele repair.  She is status post spinal  fusion cervical and also had low back surgery.  She has had hernia  repair.  She has a history of a hiatal hernia as well.  She has seasonal  bronchitis.   SOCIAL HISTORY:  She is married, she smokes one pack per day and has  been doing so since the age of 61.  No alcohol or illicit drug use.  She  is disabled because of her back pain.  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   FAMILY HISTORY:  Positive for  mother with history of heart failure and  died due to complications of heart failure at 8.  She had a CABG also  at the age of 36.  Father with a history of bradycardia requiring a  pacemaker.  The mother also required a pacemaker.  Father died due to  complications of multiple myeloma at the age of 79.  Sister with a  history of an arrhythmia.   REVIEW OF SYSTEMS:  Twelve point review of systems negative, otherwise  as stated above.  She has a history of urinary incontinence, history of  constipation, history of fatigue.   ALLERGIES:  1. SHE IS ALLERGIC TO CONTRAST DYE WHICH CAUSES A RASH.  2. SHE IS ALLERGIC TO MYCIN WHICH SHE NOTES CAUSE ANAPHYLAXIS.  3. SULF DRUGS CAUSE A RASH.  4. PENICILLIN CAUSE ANAPHYLAXIS.  5. BETA BLOCKERS CAUSE NAUSEA.  6. ASPIRIN CAUSE A BURNING STOMACH.   MEDICATIONS:  1. Domperidone which is a [place] drug, 10 four times a day.  2. Avandamet 500 mg daily.  3. Zoloft 50 mg daily.  4. Triamterene and hydrochlorothiazide 37.5 and 25 mg daily.  5. Reglan 5 mg twice a day.  6. Advair 100/50 twice a day.  7. Stool softener 50 mg as needed.  8. She is on Darvocet-N 100, 600 p.r.n.  9. Hydrocodone 500/5 p.r.n.  10.Hydromorphone 4 mg p.r.n.  11.Baclofen 10 mg p.r.n.  12.Naproxen 500 mg p.r.n.  13.Advair inhaler p.r.n.  14.Meclizine 25 mg p.r.n.  15.Prevacid.   PHYSICAL EXAMINATION:  VITAL SIGNS:  Temperature is 97.2 with a pulse of  70, respiratory rate 18, blood pressure 109/64, sats 97% on room air.  IN GENERAL:  She is a female that looks younger than her stated age in  no acute distress.  HEENT:  Normocephalic, atraumatic.  Pupils are equal round and reactive  to light.  Extraocular movements are intact.  Nares are without  discharge.  She has moist mucous membranes with relatively good  dentition.  NECK:  Supple with no lymphadenopathy or thyromegaly.  No bruits, no  jugular venous distention.  Lymphadenopathy none.  CARDIOVASCULAR EXAM:   Regular rhythm and rate with normal S1, S2, no S3  or S4 noted.  No displacement of her point of maximum impulses.  Her  pulses are 2+ bilaterally.  Dorsalis pedis, posterior tibial and radial  and they are symmetric.  LUNGS:  Clear  to auscultation bilaterally.  There is no wheezing noted.  SKIN:  Shows no rashes.  ABDOMEN:  Soft, nontender, nondistended with no rebound or guarding.  EXTREMITIES:  Show no clubbing, cyanosis or edema.  MUSCULOSKELETAL:  Shows no joint deformities or effusion.  NEUROLOGIC EXAM:  She is alert and oriented x4 with cranial nerves II-  XII grossly intact.  Strength and sensation grossly intact.   EKG showed sinus rhythm at 65 with normal axis with borderline right, P-  R interval of 196, QRS 83, QT corrected at 464 with no major changes  compared to EKG 05/18/1998.  There are no Q waves and no nonspecific  abnormalities anteriorly which were present in the previous EKG.   LABS:  She had a hemoglobin of 14.6, hematocrit 43, platelets are  pending, sodium of 133 and a potassium of 3.4.  She had a chloride of  101, BUN of 13, creatinine 0.8, glucose of 153.  Cardiac markers at 062  are unremarkable.  Her venous blood gas showed a pH of 7.465 and a CO2  of 38.3, bicarb was 27.6.   ASSESSMENT/PLAN:  This is a patient with a history of longstanding  tobacco abuse and possible significant family history who presents with  apparent unstable angina.  Differential diagnosis also includes cervical  pain, gastroparesis and gastroesophageal reflux disease related to pain  or pulmonary embolus.  For her chest pain she will be continued on a  heparin drip, and will give aspirin enteric coated with intravenous  proton pump inhibitor and will hold all n.p.o. in the event that she may  need assessment of her coronary blood flow.  She will get a CBC with  diff to evaluate her platelets.  Will hold oral hypoglycemics and check  a TSH and magnesium.  For her hypokalemia will  replete for a rule out  PE, will get a CT chest, will also rule out possible mass as she does  have a longstanding history of tobacco use.  For her possible chronic  obstructive pulmonary disease/bronchitis, will continue albuterol and  Advair.  For her palpitations she will be on telemetry and TSH and  magnesium are ordered.  For her tobacco abuse, the patient was counseled  extensively on discontinue tobacco.  For her gastroparesis, she will be  on proton pump inhibitor twice a day.  Deep venous thrombosis  prophylaxis, she will be continued on most of her medications, but will  discontinue her naproxen and her meclizine for now.  Will also hold her  hydrochlorothiazide, hydromorphone.  Will continue her Darvocet and give  her morphine for non-possible cardiac-related chest pain.  Will also  hold the Avandamet.  The patient for her diabetes should be on sliding  scale insulin.  Will get a hemoglobin A1c and again hold her oral  hypoglycemic.  Will also check a lipid panel.      Darryl D. Prime, MD  Electronically Signed      Learta Codding, MD,FACC  Electronically Signed    DDP/MEDQ  D:  09/07/2006  T:  09/07/2006  Job:  [id number]

## 2010-07-18 NOTE — H&P (Signed)
NAMEADAMARYS, SHALL          ACCOUNT NO.:  [id number]   MEDICAL RECORD NO.:  [id number]          PATIENT TYPE:  EMS   LOCATION:  MAJO                         FACILITY:  [facility]   PHYSICIAN:  Mobolaji B. Bakare, M.D.DATE OF BIRTH:  [birth year]   DATE OF ADMISSION:  02/01/2007  DATE OF DISCHARGE:                              HISTORY & PHYSICAL   PRIMARY CARE PHYSICIAN:  [name]. Renae Gloss, M.D.   CARDIOLOGIST:  Bevelyn Buckles. Bensimhon, M.D.   CHIEF COMPLAINT:  Shortness of breath.   HISTORY OF PRESENT ILLNESS:  Ms. Holian is a 65 year old pleasant  Caucasian female with multiple medical problems listed below.  She does  have a history of COPD and ongoing tobacco abuse, nonobstructive CAD.  She recently had cardiac catheterization in August 2008.   The patient was in her usual state of health until about 2 weeks ago,  when she had acute bronchitis.  She was treated with a 7-day course of  Avelox and also steroid by Dr. Renae Gloss.  The patient felt better, was  still using the antibiotics.  She did not need as much nebulization at  all.  She continued to feel better after completing the treatment until  2 days ago, when she woke up sick.  She was wheezing and coughing.  Cough was unproductive of sputum most times.  She had no pleuritic chest  pain, no diaphoresis.  The patient went to the urgent care center today.  She had a chest x-ray.  This was reviewed by the radiologist here at  [facility].  There was no pneumonia or infiltrate on chest x-ray.  The  patient denied chills or fever.  Since she has been in the emergency  room she has had nebulization.  She is currently feeling better but  still wheezes.   REVIEW OF SYSTEMS:  She denies chest pain, diaphoresis.  No nausea,  vomiting or diarrhea.  No dysuria or increase in frequency of  micturition.  There is no weight loss.   PAST MEDICAL HISTORY:  1. Type 2 diabetes mellitus.  2. Gastroparesis of unclear etiology.  She is on  domperidone for this.  3. Nonobstructive CAD.  4. Lupus.  5. Depression.  6. Degenerative joint disease.  7. Hypertension.  8. Chronic obstructive pulmonary disease.  9. Ongoing tobacco abuse.   PAST SURGICAL HISTORY:  1. Appendectomy.  2. Hysterectomy.  3. Bladder surgery.  4. Spinal fusion.  5. Laminectomy.  6. __________.   CURRENT MEDICATIONS:  1. Triamterene/hydrochlorothiazide 37.5/25 one daily.  2. Advair Diskus 100/50 daily.  3. Sertraline 50 mg daily.  4. Albuterol p.r.n.  5. Lantus 40 units daily.  6. Avandamet 500.   ALLERGIES:  ASPIRIN, CODEINE, ERYTHROMYCIN, IVP DYE, IODINE, PENICILLIN,  SULFA.   FAMILY HISTORY:  Significant for both parents dying at the age of 39.  Mother passed away from congestive heart failure and father passed away  from multiple myeloma.   SOCIAL HISTORY:  The patient has been smoking since the age of 67.  She  is currently smoking 1 pack per day.  She has tried unsuccessfully to  quit smoking.  She  does not drink alcohol or use illicit drugs.   PHYSICAL EXAMINATION:  VITAL SIGNS:  Initial vitals while in the  emergency room:  Temperature 97.1, blood pressure 112/70, pulse 80,  respiratory rate 32, after nebulization this was 18.  O2 sats were 99%  on oxygen.  GENERAL:  On examination, the patient is awake and alert and oriented to  time, place and person.  She can complete sentences without any problem.  She is not pale, anicteric.  LUNGS:  Bilateral expiratory wheezes.  CARDIOVASCULAR:  S1 and S2 regular.  No murmur, no gallop.  ABDOMEN:  Nondistended, soft, nontender.  Bowel sounds present.  No  palpable organomegaly.  EXTREMITIES:  No pedal edema or calf tenderness as well as pedis pulses  palpable bilaterally.  No cyanosis.  CENTRAL NERVOUS SYSTEM:  There is no focal neurological deficit.   INITIAL LABORATORY DATA:  White cell count 14.1, hemoglobin 14.3,  hematocrit 41.5, platelets 323.  Neutrophils 79% with absolute   neutrophil count of 11.1.  Sodium 135, potassium 3.3, chloride 97,  bicarb 27, BUN 13, creatinine 0.8, glucose 156.  LFTs were within normal  limits except for albumin less than 1.  Urinalysis was unremarkable.  EKG showed normal sinus rhythm.  No acute ST changes.   ASSESSMENT/PLAN:  Ms. Stencil is a 65 year old Caucasian female  presenting with wheezing, shortness of breath and increased cough, and  she has history of COPD and ongoing tobacco abuse.  She was recently  treated for acute bronchitis with a 7-day course of Avelox and also  steroid.  The patient is currently feeling better after nebulization in  the emergency room.  She will be admitted for COPD exacerbation.   ADMISSION DIAGNOSES:  1. Chronic obstructive pulmonary disease exacerbation.  She will be      placed on nebulization using albuterol and Atrovent q.6h. and q.2h.      p.r.n., Solu-Medrol 60 mg IV q.6h., Avelox 400 mg daily.  I am      holding Advair for now while on steroid.  We will follow up with      chest x-ray in the morning, PA and lateral, after IV fluid      hydration to rule out pneumonia.  2. Lupus.  This appears quiescent at this time.  3. Diabetes mellitus.  We will resume home dose of Lantus and      Avandamet.  She will also be covered with sliding-scale insulin.  4. Gastroesophageal reflux disease.  She will continue with PPI ,      Reglan and domperidone.  5. Hypoalbuminemia.  We will check prealbumin and repeat albumin in      the a.m.  6. Ongoing tobacco abuse.  She will be placed on nicotine patch and      offered tobacco cessation counseling.  7. Hypokalemia.  This will be replenished with potassium supplement.      Mobolaji B. Corky Downs, M.D.  Electronically Signed     MBB/MEDQ  D:  02/01/2007  T:  02/01/2007  Job:  [id number]   cc:   [name]. Renae Gloss, M.D.  Bevelyn Buckles. Bensimhon, MD

## 2010-07-18 NOTE — H&P (Signed)
[name], [name]          ACCOUNT NO.:  [id number]   MEDICAL RECORD NO.:  [id number]          PATIENT TYPE:  EMS   LOCATION:  ED                            FACILITY:  [facility]   PHYSICIAN:  Osvaldo Shipper, MD     DATE OF BIRTH:  05-16-1945   DATE OF ADMISSION:  06/07/2008  DATE OF DISCHARGE:  LH                              HISTORY & PHYSICAL   PMD:  [name]. Renae Gloss, M.D. in [place] with [facility].   ADMISSION DIAGNOSES:  1. Acute bronchitis.  2. Tobacco abuse.  3. Insulin-dependent diabetes.  4. Hypertension.   CHIEF COMPLAINT:  Cough, shortness of breath for the past 1 week.   HISTORY OF PRESENT ILLNESS:  Patient is a 65 year old Caucasian female  who was in her usual state of health about a week to 10 days ago when  she started developing a cough with yellow expectoration.  She also had  a sore throat and sinus pain.  She went to her doctor's office on  Tuesday and was diagnosed with bronchitis.  She was also noted to have a  right ear infection.  She was given an injection of steroids and started  on nebulizer treatments and was also prescribed Avelox for 10 days.  Her  symptoms have worsened.  Over the past couple of days she has been  coughing quite a bit, but this has been dry.  Shortness of breath has  increased.  She has been wheezing quite a bit.  She has also been  experiencing headaches.  She denies any fever.  No sick contacts.  No  recent travel outside this area.  No chest pain but has been  experiencing soreness with cough.  No nausea, vomiting, diarrhea.   HOME MEDICATIONS:  1. Sertraline 100 mg daily.  2. Albuterol as needed.  3. Lantus insulin, between 10 and 40 units once daily.  4. Avandamet, unknown dose, daily.  5. Zolpidem 5 mg at night.  6. Levothyroxine112 mcg daily.  7. Simvastatin 20 mg daily.  8. Triamterene/HCT.  9. Synthroid 37.5 x 25 mg daily.  10.Domperidone, which she takes for her gastroparesis 4 times a day.  11.Folast daily as well.   ALLERGIES:  1. ASPIRIN.  2. CODEINE.  3. ERYTHROMYCIN.  4. IV DYE.  5. MORPHINE.  6. PENICILLIN.  7. SULFA.   PAST MEDICAL HISTORY:  1. Bronchitis.  2. Diabetes.  3. Gastroparesis.  4. Hypertension.  5. History of lupus.  6. Vertigo.  7. Hyperthyroidism.   SURGICAL HISTORY:  1. Appendectomy.  2. Hysterectomy.  3. Bladder surgery.  4. Rectocele.  5. Cervical spinal surgery.  6. Lumbar spinal surgery.   SOCIAL HISTORY:  Lives in [place] with her husband.  Smokes one pack  of cigarettes.  Has a 45-pack-year history of smoking.  No alcohol use.  No illicit drug use.  Currently not working.   FAMILY HISTORY:  Positive for multiple myeloma and heart disease.   REVIEW OF SYSTEMS:  GENERAL:  Positive for weakness, malaise.  HEENT:  Positive for headaches.  CARDIOVASCULAR:  Unremarkable.  RESPIRATORY:  As in HPI.  GI:  Unremarkable.  GU:  Unremarkable.  NEUROLOGIC:  Positive for headaches.  DERMATOLOGICAL:  Unremarkable.  MUSCULOSKELETAL:  Unremarkable.   PHYSICAL EXAMINATION:  VITAL SIGNS:  Temperature 97.4, blood pressure  121/74, heart rate 81, respiratory rate 20.  GENERAL:  This is an overweight white female in no distress.  HEENT:  There is no pallor, no icterus.  Oral mucous membranes are  moist.  She does have a evidence for frontal as well as maxillary sinus  tenderness.  NECK:  Soft and supple.  No thyromegaly is appreciated.  LUNGS:  Occasional wheezes bilaterally.  No crackles are present.  CARDIOVASCULAR:  S1 and S2 is normal.  Regular.  No murmurs appreciated.  No S3 or S4.  No rubs or bruits.  ABDOMEN:  Soft, nontender, nondistended.  Bowel sounds are present.  No  masses or organomegaly is appreciated.  EXTREMITIES:  No edema.  MUSCULOSKELETAL:  Unremarkable.  NEUROLOGIC:  She is alert and oriented x3.  No focal neurological  deficits are present.   LAB DATA:  Her CBC is unremarkable.  Her potassium is 3.3.  Chloride is   96.  Glucose 106.  Renal function is normal.   Chest x-ray showed evidence for COPD without any acute abnormality.   ASSESSMENT:  This is a 65 year old white female with hypertension,  diabetes, history of lupus, gastroparesis, who presents with shortness  of breath, cough, headache, sinus tenderness.  She has evidence for  acute bronchitis.  She probably also has acute sinusitis.  She also has  insulin-dependent diabetes.  She is mildly hypokalemic.   PLAN:  1. Acute bronchitis:  We will treat with steroids, nebulizer      treatment.  Continue with Avelox for now.  Antitussive agents will      be prescribed.  At this point, no need to obtain an ABG.  I think      that the patient will improve quickly.  2. Possible sinusitis:  She does hae sinus tenderness, and this has      been ongoing for almost a week now, so I will go ahead and get a      noncontrast CT of her sinuses, considering her symptomatology.  3. Diabetes:  Her glycemic control probably got worse from the      steroids.  Will continue with Lantus.  Check an HB A1C and put her      on sliding scale.  4. Hypertension:  Continue with antihypertensive agents.  5. History of hypothyroidism.  6. Tobacco abuse:  Counseling will be provided.  I already discussed      this issue with her briefly.  A nicotine patch will be utilized.  7. History of gastroparesis.  We will let her take her own supply of      domperidone, which is not available in the [place].  8. DVT prophylaxis will be provided.   Further management decision will be based on the results of initial  testing and patient's response to treatment.      Osvaldo Shipper, MD  Electronically Signed     GK/MEDQ  D:  06/07/2008  T:  06/07/2008  Job:  [id number]   cc:   [name]. Renae Gloss, M.D.  Fax: [phone]

## 2010-07-18 NOTE — Cardiovascular Report (Signed)
[name], [name]          ACCOUNT NO.:  [id number]   MEDICAL RECORD NO.:  [id number]          PATIENT TYPE:  INP   LOCATION:  3704                         FACILITY:  [facility]   PHYSICIAN:  Rollene Rotunda, MD, FACCDATE OF BIRTH:  1945/11/26   DATE OF PROCEDURE:  09/09/2006  DATE OF DISCHARGE:  09/09/2006                            CARDIAC CATHETERIZATION   PRIMARY:  Dr. Andi Devon   CARDIOLOGIST:  Dr. Nicholes Mango   PROCEDURE:  Left heart catheterization/coronary arteriography.   INDICATION:  Evaluate patient with chest pain and multiple  cardiovascular risk factors.   PROCEDURE NOTE:  Left heart catheterization was performed via the right  femoral artery.  The artery was cannulated using an anterior wall  puncture.  A #6-French arterial sheath was inserted via the modified  Seldinger technique.  Preformed Judkins and a pigtail catheter were  utilized.  The patient tolerated the procedure well and left the  laboratory in stable condition.   RESULTS:  Hemodynamics:  LV 136/19, AO 130/91.   Coronaries:  The left main was normal.  The LAD had mild calcification.  There was mid 30% stenosis followed by a mid 25% stenosis.  There were  diffuse luminal irregularities.  First and second diagonal were small  and normal.  The circumflex was essentially a large circumflex obtuse  marginal with diffuse luminal irregularities.  The right coronary artery  was a large dominant vessel.  There were diffuse luminal irregularities.  The PDA was large with ostial 50% stenosis.  There were diffuse luminal  irregularities in the PDA.  Posterolateral was large and normal.   Left ventriculogram:  The left ventriculogram was obtained in the RAO  projection.  The EF was 65% with normal wall motion.   CONCLUSION:  Diffuse mild coronary plaque.  Normal left ventricular  function.   PLAN:  The patient will need aggressive risk reduction.  I have talked  to her again about the need to stop  smoking.  I did speak with Dr.  Charna Elizabeth who has follow this patient for GI complaints, as this may  be the etiology of her discomfort.  I called Dr. Mathews Robinsons office but  she is off this week.  I will send a complete report.      Rollene Rotunda, MD, [facility]  Electronically Signed     JH/MEDQ  D:  09/09/2006  T:  09/10/2006  Job:  [id number]   cc:   [name]. Renae Gloss, M.D.  Anselmo Rod, M.D.

## 2010-07-18 NOTE — Discharge Summary (Signed)
[name], [name]          ACCOUNT NO.:  [id number]   MEDICAL RECORD NO.:  [id number]          PATIENT TYPE:  INP   LOCATION:  A307                          FACILITY:  [facility]   PHYSICIAN:  Dorris Singh, DO    DATE OF BIRTH:  1946/01/04   DATE OF ADMISSION:  06/07/2008  DATE OF DISCHARGE:  04/07/2010LH                               DISCHARGE SUMMARY   ADMISSION DIAGNOSES:  1. Acute bronchitis.  2. Tobacco abuse.  3. Insulin-dependent diabetes.  4. Hypertension.   DISCHARGE DIAGNOSES:  1. Acute bronchitis.  2. Chronic obstructive pulmonary disease.  3. Diabetes.  4. Hypertension.  5. History of lupus.  6. Tobacco abuse.  7. Leukocytosis secondary to steroid use.   Her radiology tests that were done include on April 5 she had a portable  one-view chest that showed stable mild changes of COPD, no acute  abnormality.  She had a CT of her sinuses which demonstrated on April 6  no significant sinus abnormalities.   HOSPITAL COURSE:  The patient was admitted to the service of [facility]  with above diagnoses.  She was started on breathing treatments,  antibiotics which include the antibiotics she was on upon admission  which included Avelox.  Also, a CT was obtained and a chest x-ray upon  admission.  They continued to follow her diabetes by checking her and  putting her on the glycemic protocol.  Also, her hypertension, she was  continued on all of her home medications.  For tobacco abuse, she was  counseled at admission as well as at discharge regarding the issues of  how to stop, and for her gastroparesis, she was taking her own  domperidone which is not available in the [place].  She continued  to progress, and on the 7th, the patient wanted to go home as well, and  it was determined that she was stable enough to do so.   She was sent home on her home medications which include:  1. Albuterol as needed.  2. Lantus 10 and 40 units once a day.  3. Avandamet  unknown dose daily.  4. Zolpidem 5 mg at night.  5. Levothyroxine 112 mcg daily.  6. Simvastatin 20 mg daily.  7. Dyazide, no dose given.  8. Avelox 400 mg p.o. daily for 5 days as a new medication.  9. Atrovent 0.5 unit dose up to t.i.d. p.r.n.  10.Tessalon Perles 100 mg p.o. t.i.d. p.r.n. cough.  11.She is on Maxzide 1 tablet p.o. daily,   PHYSICAL EXAMINATION:  Today, she was seen and examined.  Her current  vitals:  Temperature 97.1, pulse 64, respirations 16, blood pressure  104/56.  Generally, she is well-developed, well-nourished in acute distress.  HEART:  Is regular rate and rhythm.  LUNGS:  No wheezing noted.  Decreased breath sounds throughout both lung  fields.  ABDOMEN:  Soft, nontender, nondistended.  EXTREMITIES:  Positive pulses.   Her labs for today:  Her white count was 20.2, hemoglobin was 12.9,  hematocrit 36.4 and platelet count of 235.  Her sodium was 132,  potassium 4.5, chloride 97, CO2 26, glucose 286, BUN  23, creatinine  0.96.   DISCHARGE INSTRUCTIONS:  The patient was discharged to home with the  following instructions:  She will continue her home medications.  She  was placed on 3 new medications which she will start on.  It is  recommended that she follow up with her primary care physician in 1-3  days which we will set up an appointment for her.  Also, she is to stop  smoking.  Spent 25 minutes counseling the patient regarding the benefits  of smoking cessation.  She is to use her nebulizer treatments as needed  to help with her COPD, and she is to follow up to check on her white  count, as well if symptoms worsen she is to return the emergency room.      Dorris Singh, DO  Electronically Signed     CB/MEDQ  D:  06/09/2008  T:  06/09/2008  Job:  [id number]

## 2010-07-21 NOTE — Discharge Summary (Signed)
NAMECASSIOPEIA, [name]          ACCOUNT NO.:  [id number]   MEDICAL RECORD NO.:  [id number]          PATIENT TYPE:  INP   LOCATION:  3029                         FACILITY:  [facility]   PHYSICIAN:  Cristi Loron, M.D.DATE OF BIRTH:  02-07-1946   DATE OF ADMISSION:  05/07/2007  DATE OF DISCHARGE:  05/10/2007                               DISCHARGE SUMMARY   BRIEF HISTORY:  The patient is a 65 year old white female who has  suffered from back and leg pain consistent with neurogenic claudication.  She has failed medical measures.  Vertebral lumbar MRI, which  demonstrated that the patient has spondylolisthesis L4-L5 with small  stenosis and disk degeneration.  I discussed the various treatment  options with the patient, including surgery.  She has weighed the risks,  benefits, and alternatives of the surgery, I will proceed with the an L4-  L5 decompression and fusion.   For further details of this admission, please refer the typed history  and physical.   HOSPITAL COURSE:  Admit:  The patient was admitted to the [facility] on May 07, 2007.  On the day of admission, I performed L4-L5  decompression, instrumentation, and fusion.  Surgery went well.  (For  full details of this operation, please refer typed operative note).   POSTOP COURSE:  The patient's postop course was unremarkable except for  urinary retention, which resolved and she was discharged to home on  May 10, 2007.   DISCHARGE PRESCRIPTIONS:  1. Dilaudid 4 mg #100, one p.o. q.4 h for pain.  2. Valium 5 mg #50 one p.o. q.8 h p.r.n. muscle spasms.  3. Neurontin 600 mg 1 p.o. t.i.d.   DISCHARGE INSTRUCTIONS:  The patient was given written discharge  instructions and was instructed to follow up in 4 weeks.   FINAL DIAGNOSES:  1. L4-L5 grade 1 acquired spondylolisthesis, degenerative disk      disease.  2. Spinal stenosis.  3. Lumbar radiculopathy/myelopathy lumbago.  4. Urinary retention.   PROCEDURE  PERFORMED:  1. Bilateral L4 laminectomies.  2. Decompression bilateral L4-L5 nerve roots; mid L4-L5 transforaminal      lumbar interbody fusion with local      allograft bone and VITOSS bone graft extender; L4-L5 interbody      prosthesis (Peak Interbody Prosthesis); L4-L5 posterior      nonsegmental instrumentation with Legacy titanium pedicle screw or      rods; L4-L5 posterior lateral arthrodesis with local motion with      allograft bone and VITOSS bone graft extender.      Cristi Loron, M.D.  Electronically Signed     JDJ/MEDQ  D:  06/05/2007  T:  06/06/2007  Job:  [id number]

## 2010-07-21 NOTE — H&P (Signed)
[place]. [facility]  Patient:    [name], [name]                 MRN: [id number] Adm. Date:  [id number] Disc. Date: [id number] Attending:  Tressie Stalker D                         History and Physical  CHIEF COMPLAINT:  Right arm pain.  HISTORY OF PRESENT ILLNESS:  The patient is a 65 year old white female who has has trouble with her neck for several years.  She more recently developed right-sided neck pain radiating to her right trapezius and right shoulder and arm.  She failed medical management and was worked with a cervical MRI demonstrating she did have spondylosis at C4-5, C5-6, and C6-7.  PAST MEDICAL HISTORY:  Positive for lupus, fibromyalgia, hernia, rectocele.  PAST SURGICAL HISTORY:  Hysterectomy 1974, bladder surgery in 1976, herniorrhaphy in 1995, rectocele repair in 1999.  MEDICATIONS:  Prior to admission: 1. Celebrex 200 mg p.o. q.d. 2. Effexor 75 mg p.o. q.d. 3. Premarin 0.625 mg p.o. q. day. 4. Advair 100/50 2 times q. day.  DRUG ALLERGIES:  PENICILLIN, all the MYCINS, SULFA, CODEINE, DYES, AND ASPIRIN.  FAMILY HISTORY AND MEDICAL HISTORY:  Both the patients parents died at age 24. Mother died of a myocardial infarction and congestive heart failure, father of multiple myeloma.  She has a brother with cerebrovascular accident and myocardial infarction age 39, another brother age 36, with coronary artery disease.  SOCIAL HISTORY:  The patient is married.  She has 3 children.  She lives in [place].  She smokes 1/2 pack per day of cigarettes x approximately 30 years.  I highly advised her to quit.  She drinks alcohol rarely and socially.  She denies drug use.  REVIEW OF SYSTEMS:  Negative except as above.  PHYSICAL EXAMINATION:  GENERAL:  Pleasant, well-nourished well-developed 65 year old white female complaining of right arm pain and numbness.  VITAL SIGNS:  Height 5 foot 5 inches.  Weight 160 pounds.  HEENT:   Normal.  NECK:  Supple.  There is no masses, deformities, tracheal deviation, jugular venous distension, or carotid bruits.  She has a limited cervical range of motion.   Spurlings test is positive on the right and negative on the left. Lhermittes sign was not present.  Thorax is symmetric.  LUNGS:  Clear to auscultation.  HEART:  Regular rate and rhythm.  ABDOMEN:  Soft, nontender.  EXTREMITIES:  No deformities.  BACK:  There is no point tenderness to ______ sign.  NEUROLOGIC:  The patient is alert and oriented x 3.  Cranial nerves II-XII are grossly intact bilaterally.  Vision and hearing are grossly normal bilaterally.  Motor strength is 5/5 in her bilateral deltoid, bicep, tricep, wrist extensor, and osseous psoas.  Quadriceps and gastrocnemius, extensor hallucis longus, left deltoid, and hand grand.  Her right deltoid and hand grip strength are slightly diminished at 4+/5.  Sensory exam demonstrates decreased light touch in her right upper extremity.  Deep tendon reflexes 2/4 in her bilateral biceps, triceps, brachial radialis, gastrocnemius, 3/4 in her bilateral quadriceps.  She has bilateral flexor plantar reflexes, no ankle clonus.  IMAGING STUDIES:  The patient had a cervical MRI performed without contrast dye on June 05, 2000.  She has a straight cervical spine and multiple level degenerative disease and spondylosis most prominent at C4-5 and C6-7, and ______ C5-6.  ASSESSMENT AND PLAN:  C4-5, C5-6,  and C6-7 herniated nucleus pulposus, spondylosis, degenerative joint disease, stenosis, cervical radiculopathy.  I have discussed this situation with the patient, reviewed her MRI scan with her, pointing out the abnormalities.  I have discussed the various treatment options with her including doing nothing, continued medical management and surgery.  I described procedure, it may be C4-5, C5-6, and C6-7 anterior cervical diskectomy, interbody iliac crest allograft or  arthrodesis and anterior cervical plating.  I described the surgery to her showing her surgical models, and discussed the risks of surgery extensively.  The patient has weighed the risks, benefits and alternatives of surgery and decided to proceed with the operation on June 27, 2000. DD:  06/27/00 TD:  06/28/00 Job: [id number]

## 2010-07-21 NOTE — Op Note (Signed)
[place]. [facility]  Patient:    [name], PETKUS                 MRN: [id number] Proc. Date: 06/27/00 Adm. Date:  [id number] Disc. Date: [id number] Attending:  Tressie Stalker D                           Operative Report  BRIEF HISTORY:  The patient is a 65 year old white female, who suffers from neck and right arm pain.  She failed medical management and was worked up as an outpatient with a cervical MRI demonstrating a herniated disk, spondylosis and stenosis at C4-5, C5-6, and C6-7.  The patient had failed medical management and she therefore weighed the risks, benefits and alternatives to surgery and decided to proceed with a anterior cervical diskectomy and fusion with plating.  PREOPERATIVE DIAGNOSES:  C4-5, C5-6, and C6-7 herniated nucleus pulposus, degenerative disk disease, spondylosis, spinal stenosis, cervicalgia, and cervical radiculopathy.  POSTOPERATIVE DIAGNOSES:  C4-5, C5-6, and C6-7 herniated nucleus pulposus, degenerative disk disease, spondylosis, spinal stenosis, cervicalgia, and cervical radiculopathy.  PROCEDURE:  C4-5, C5-6, and C6-7 extensive anterior cervical diskectomy, interbody iliac crest allograft arthrodesis, anterior cervical plating using Codman titanium anterior plate and screws.  SURGEON:  Cristi Loron, M.D.  ASSISTANT:  Tanya Nones. Jeral Fruit, M.D.  ANESTHESIA:  General endotracheal.  ESTIMATED BLOOD LOSS:  200 cc.  SPECIMENS:  None.  COMPLICATIONS:  None.  DRAINS:  None.  DESCRIPTION OF PROCEDURE:  The patient was brought to the operating room by the anesthesia team, general endotracheal anesthesia was induced.  The patient remained in the supine position.  A roll was placed under her shoulders to place her neck in slight extension.  Her anterior cervical region was then prepared with Betadine scrub and Betadine solution.  Sterile drapes were applied.  I then injected the area to be incised with  Marcaine with epinephrine solution.  I used a scalpel to make a transverse incision in the patients left anterior neck.  I used the Metzenbaum scissors to divide the platysma muscle and then dissect medial to the sternocleidomastoid muscle, jugular vein, and carotid artery.  I bluntly dissected down to the anterior cervical spine and carefully identified the esophagus and retracted it medially.  I cleared the soft tissue from the anterior cervical spine with the Kitner swabs and then inserted a bent spinal needle into the exposed intervertebral disk space.  I obtained an intraoperative radiography to confirm my location.  I then used the electrocautery to detach the medial border of the longus colli muscle bilaterally from the C4-5, C5-6, and C6-7 intervertebral disk space.  I inserted the Caspar self-retaining retractor for exposure.  I began at C4-5.  I incised the C4-5 intervertebral disk.  There was some anterior spondylosis, which I drilled away with the drill.  I performed a partial diskectomy using the pituitary forceps and the Carlens curets.  I then inserted distraction screws into C4 and C5, distracted the interspace and then used the high-speed drill to decorticate the vertebral endplates at C4-5, drilling away the remainder of the intervertebral disk and thinning out the posterior longitudinal ligament.  I drilled some bone spurs off the vertebral endplates posteriorly.  I incised the posterior longitudinal ligament with the arachnoid knife and then removed it with the Kerrison punch, undercutting the vertebral endplates at C4-5 and performed a foraminotomy about the bilateral C5 nerve root.  I got good decompression  of the thecal sac and bilateral C5 nerve roots.  I then repeated the procedure at C5-6, i.e., I removed the distraction pins from C4 and placed into C6, distracted C5-6, incised the intervertebral disk, performed a partial diskectomy with pituitary forceps and  Carlens curets.  I used the high-speed drill to decorticate the vertebral endplates and drilled away the remainder of the disk thinning out the posterior longitudinal ligament.  I incised that ligament with the arachnoid knife and then removed it with the Kerrison punch, undercutting the vertebral endplates of C5-6 decompressing the thecal sac at this level.  I performed a foraminotomy about the bilateral C6 nerve root.  I then repeated this procedure at C6-7, i.e., I removed the distraction pins from C5 and placed into C7, distracted the interspace, incised the disk.  I performed a partial diskectomy with the pituitary forceps, drilled away the remainder of the disk with the high-speed drill and decorticated the vertebral endplates, thinned out the posterior longitudinal ligament, incised it with the arachnoid knife and then removed it with the Kerrison punch undercutting the vertebral endplates of C6-7, decompressing the thecal sac and then performed a foraminotomy about the bilateral C7 nerve root.  At this point, I had a good decompression at all three levels.  I now turn my attention to arthrodesis.  I obtained iliac crest tricortical allograft bone grafts and fashioned them to these approximate dimensions - 6 mm in height, 1 cm of depth.  I inserted one bone graft into the distracted C6-7 interspace.  I then distracted C5-6 interspace, brought down another graft in there and then distracted the C4-5 interspace and with distraction screws and placed the final graft into C4-5 interspace.  I removed the distraction screws.  There was a good snug fit of the bone graft at each level.  I then used the high-speed drill to remove some bone spurs anterior from the vertebral endplates so that the anterior plate would lie flat.  I obtained the appropriate length Codman anterior cervical plate, laid it along the anterior aspect of the vertebral bodies from C4 to C7, drilled two holes at each  level, tapped holes and then placed two screws at C4, 5, 6 and 7.  I then obtained an intraoperative radiograph that demonstrated good position of the plate, screws, and interbody grafts at C4, 5, and 6.  I could  not see the graft at C6-7 or the screws at C7 very well, but they looked good in vivo.  I then secured the screws to the plate with the Cam tightener.  I then copiously irrigated the wound with bacitracin solution, achieved ______ hemostasis with bipolar electrocautery and Gelfoam.  I removed the Caspar self-retaining retractor and inspected the esophagus for any damage, there was none.  I then reapproximated the patients platysma muscle with interrupted 3-0 Vicryl suture, the subcutaneous tissue with interrupted 3-0 Vicryl suture, and the skin with Steri-Strips and benzoin.  The wound was then coated with bacitracin ointment, a sterile dressing was applied.  The drapes were removed and the patient was subsequently extubated by the anesthesia team and transported to the post anesthesia care unit in stable condition.  All sponge, instrument and needle counts were correct at the end of this case.  was then turned to the prone position on the [place] frame.  His lumbosacral region was then prepared with Betadine scrub and Betadine solution, sterile drapes were applied.  I then injected the area to be incised with Marcaine with epinephrine  solution.  I used a scalpel to incise through the inferior aspect of the patients previous surgical incision.  I used electrocautery to dissect down to the thoracolumbar fascia, divided the fascia just to the left of midline performing a left-sided subperiosteal dissection, stripping the paraspinous musculature from the left spinous process and lamina of L4, L5, and upper sacrum dissecting through the scar tissue.  I inserted the McCullough retractor for exposure.  I then obtained an intraoperative radiograph to confirm my location.  I then brought  the operative microscope into the field and under instrument magnification and illumination, I completed the microdissection/decompression.  I used the high-speed drill to perform a left L4 and L5 laminotomy.  I widened the laminotomy with the Kerrison punch removing the left L4-5 and L5-S1 ligamentum flavum and some epidural scar tissue.  I decompressed the thecal sac and performed a foraminotomy about the left L5 and S1 nerve root.  I carefully freed up the thecal sac and the nerve root from the epidural tissue and then I began at the L5-S1, I carefully retracted the thecal sac and the S1 nerve root medially exposing the left L5-S1 intervertebral disk.  At this level, there was a calcified disk herniation.  I carefully drilled away the spondylosis and then to get down to the intervertebral disk, I incised into the disk and performed a partial diskectomy using the pituitary forceps, Epstein and Scoville curets. When I was satisfied with the diskectomy, I palpated along the ventral surface of the thecal sac and the S1 nerve root and noted the neural structures were well-decompressed.  I palpated about the nerve root out in the neural foramen with the coronary dilator and it was well-decompressed there too.  I then repeated this procedure at the L4-5.  I carefully retracted the thecal sac and the L5 nerve root medially.  This exposed a large disk herniation.  This was a soft disk herniation.  I incised into it with a 15-blade scalpel and removed a fragment out of the disk herniation several fragments and then performed a partial intervertebral diskectomy using the pituitary forceps, Epstein, Scoville curets.  When I was satisfied with this diskectomy, I, in a similar fashion, palpated along the ventral surface of the thecal sac and along the L5 nerve root all the way out to the neural foramen and noted the neural structures were well-decompressed.  I then copiously irrigated the wound  with bacitracin solution, removed the solution.  I then achieved ______ hemostasis with bipolar electrocautery.  I then removed the McCullough retractor and reapproximated the patients thoracolumbar fascia with interrupted #1 Vicryl suture and the subcutaneous tissue with interrupted 2-0 Vicryl and the skin with Steri-Strips and benzoin.  The wound was then coated with bacitracin ointment and sterile dressing was applied.  The drapes were removed and the patient was subsequently returned to the supine position, where he was extubated by the anesthesia team and transported to the post anesthesia care unit in stable condition.  All sponge, instrument and needle counts were correct at the end of this case. DD:  06/27/00 TD:  06/28/00 Job: [id number]

## 2010-07-21 NOTE — Op Note (Signed)
[place]. [facility]  Patient:    [name], [name]                 MRN: [id number] Proc. Date: 10/14/00 Adm. Date:  [id number] Disc. Date: [id number] Attending:  Tressie Stalker D                           Operative Report  BRIEF HISTORY:  The patient is a 65 year old white female, who I have performed a 2-level ______ fusion and plating on, who did well from her neck and ______ arm pain point of view, but she continued to have numbness in her right hand.  I worked her up with [facility] EMGs, which demonstrated right carpal tunnel syndrome.  We discussed the various treatment options in a patient who failed medical management with anti-inflammatory medications, wrist extension, splints, etc. and she therefore weighed the risks, benefits, and alternatives to surgery and decided to proceed with a right carpal tunnel release.  PREOPERATIVE DIAGNOSIS:  Right carpal tunnel syndrome.  POSTOPERATIVE DIAGNOSIS:  Right carpal tunnel syndrome.  PROCEDURE:  Right median nerve neurolysis at the wrist (carpal tunnel release).  SURGEON:  Cristi Loron, M.D.  ASSISTANT:  None.  ANESTHESIA:  Local.  ESTIMATED BLOOD LOSS:  Minimal.  SPECIMENS:  None.  COMPLICATIONS:  None.  DRAINS:  None.  DESCRIPTION OF PROCEDURE:  The patient was brought to the operating room, her right hand and arm were prepared with Betadine scrub and Betadine solution and sterile drapes were applied.  I then injected the area to be incised with Marcaine solution and then I used a scalpel to make an incision along the patients palmar crease on the right.  I used electrocautery for hemostasis. I then used a 15-blade scalpel to cut the superficial fascia.  I then dissected down to the transverse carpal ligament, incised it with the 15-blade scalpel.  I continued the incision both proximally and distally, identified the median nerve and I worked on the ulnar aspect of the median  nerve, transecting the transverse carpal tunnel ligament both proximally and distally.  I got a complete transection of the ligament and a good decompression of the median nerve.  I then achieved hemostasis using bipolar electrocautery.  I irrigated the wound out with saline solution and then reapproximated the patients skin with a running 3-0 nylon suture.  The wound was then coated with bacitracin ointment.  A sterile dressing was applied. The drapes were removed and the patient was subsequently discharged from the operating room. All sponge, instrument and needle counts were correct at the end of this case. DD:  10/14/00 TD:  10/14/00 Job: [id number]

## 2010-07-21 NOTE — Discharge Summary (Signed)
[name], [name]          ACCOUNT NO.:  [id number]   MEDICAL RECORD NO.:  [id number]          PATIENT TYPE:  INP   LOCATION:  3704                         FACILITY:  [facility]   PHYSICIAN:  Rollene Rotunda, MD, FACCDATE OF BIRTH:  [birth year]   DATE OF ADMISSION:  09/07/2006  DATE OF DISCHARGE:  09/09/2006                         DISCHARGE SUMMARY - REFERRING   DISCHARGE DIAGNOSES:  1. Noncardiac chest discomfort.  2. Nonobstructive coronary artery disease.  3. Tobacco use.  4. Hyperlipidemia.  5. Lupus.  6. Diabetes.  7. Gastroparesis of uncertain etiology.  8. Depression.  9. DJD.  10.Hypertension.  11.Bronchitis.  12.COPD.  13.Appendectomy.  14.Hysterectomy.  15.Bladder surgery.  16.Rectocele repair.  17.Spinal fusion.  18.Low back surgery.  19.Hernia repair.   PROCEDURES:  Cardiac catheterization by Dr. Angelina Sheriff on September 09, 2006.   BRIEF HISTORY:  [name]  is a 65 year old female who presented  with an intermittent 3-1/2-week history of chest discomfort radiating to  her back, unassociated with shortness of breath or diaphoresis. At the  time, she was having some problems with gastroparesis. She initially  tried her husband's nitroglycerin which did not work initially but  eventually subsided. On the day of admission, her discomfort radiated to  the left arm, back, jaw, and was associated with nausea. As her husband  drove her to the emergency room, she continued to smoke, thinking that  would ease her discomfort.   PAST MEDICAL HISTORY:  Is notable for:  1. Lupus.  2. Diabetes.  3. Gastroparesis of uncertain etiology.  4. Depression.  5. DJD.  6. Hypertension.  7. Bronchitis.  8. COPD.  9. Appendectomy.  10.Hysterectomy.  11.Bladder surgery.  12.Rectocele repair.  13.Spinal fusion.  14.Low back surgery.  15.Hernia repair.  16.Tobacco use.   LABORATORY DATA:  Chest x-ray did not show any acute cardiopulmonary  disease.   Admission  hemoglobin 13.3, hematocrit 38.6, normal indices, platelets  317, wbc's 12.2. Prior to discharge, remained unchanged.  Admission PT  was 77, PT 13.1. Sodium 133, potassium 3.4, BUN 13, creatinine 2.0,  glucose 153. Hemoglobin A1c was elevated at 6.4.  CK-MBs, relative  indices, and troponins were within normal limits.  BNP less than 30.  Fasting lipids showed total cholesterol 271, triglycerides 300, HDL 31,  LDL180.  TSH was slightly elevated at 0.54.   EKG showed normal sinus rhythm, normal axis, early R wave, nonspecific  ST-T wave changes.   HOSPITAL COURSE:  The patient was admitted to [facility] and  placed on IV heparin overnight. She did not have any further chest  discomfort.  She had ruled out for myocardial infarction.  Dr.  Graciela Husbands  felt that she should undergo cardiac catheterization, and she was placed  on a statin  given her lipids. Nursing noted reoccurring chest  discomfort with stable vital signs relieved with sublingual  nitroglycerin. Tobacco cessation consult was performed.  Progression  nurse assisted with discharge needs. Cardiac catheterization was  performed on September 09, 2006, by Dr. Antoine Poche to evaluate her symptoms.  Her ejection fraction was 69% with normal wall motion.  According to Dr.  Jenene Slicker  note, she had diffuse mild coronary plaquing with normal LV  function. Recommended risk reduction and to follow up with Dr. Loreta Ave for  further GI evaluation and her primary care physician for mildly elevated  TSH.  We will continue her on a statin.   DISPOSITION:  The patient is discharged home.  Recommended a low-sodium  heart-healthy ADA diet .  Wound care and activities were per  supplemental discharge sheet.  She was asked to arrange followup with  Dr. Loreta Ave and Dr. Bernette Mayers.   DISCHARGE MEDICATIONS:  1. She received new prescription for Lipitor 40 mg nightly and      recommended blood work in 6-8 weeks for LFTs and FLPs with the      initiation of  Lipitor.  She was asked to continue her home      medications which include:  2. Domperidone four times a day.  3. Avandamet 500 mg daily.  She is asked to resume on Wednesday      afternoon.  4. Zoloft 50 mg daily/  5. Triamterene/hydrochlorothiazide 37.5/25 mg daily.  6. Reglan 5 mg b.i.d.  7. Advair 100/50 b.i.d.  8. Baclofen 10 mg p.r.n.  9. Advair p.r.n.  10.Meclizine p.r.n.  11.Stool softener p.r.n.  12.Prevacid daily.  13.Darvocet-N 100.  14.Hydrocodone 500/5.  15.Hydromorphone 4 mg as needed.   She is advised no smoking or tobacco products and bring all medications  to all appointments.      Joellyn Rued, PA-C      Rollene Rotunda, MD, [facility]  Electronically Signed    EW/MEDQ  D:  10/22/2006  T:  10/22/2006  Job:  [id number]   cc:   [name]. Renae Gloss, M.D.  Anselmo Rod, M.D.

## 2010-07-21 NOTE — Discharge Summary (Signed)
[name], [name]          ACCOUNT NO.:  [id number]   MEDICAL RECORD NO.:  [id number]          PATIENT TYPE:  INP   LOCATION:  5712                         FACILITY:  [facility]   PHYSICIAN:  [name] I Elsaid, MD      DATE OF BIRTH:  Apr 05, 1945   DATE OF ADMISSION:  02/01/2007  DATE OF DISCHARGE:  02/03/2007                               DISCHARGE SUMMARY   DISCHARGE DIAGNOSES:  1. Chronic obstructive pulmonary disease with exacerbation.  2. History of lupus.  3. Diabetes mellitus.  4. Gastroesophageal reflux disease.  5. Ongoing tobacco abuse.   DISCHARGE MEDICATIONS:  1. Triamterene/hydrochlorothiazide 37.5/25 once daily.  2. Advair Diskus 100/50 b.i.d.  3. Sertraline 50 mg p.o. daily.  4. Albuterol p.r.n.  5. Insulin, Lantus, 40 units subcutaneous daily.  6. Avandamet 500 mg p.o. daily.  7. Solu-Medrol IM 40 mg x1, then 20 mg IM for one dose, then 10 mg IM      for one dose, and then stop.  8. Avelox 400 mg by mouth daily for 1 week.   PROCEDURES:  Chest x-ray with mild hyperinflation and chronic mild  interstitial prominence, no acute infiltrate, mild central increased  bronchial markings.   HISTORY OF PRESENT ILLNESS:  Please review the history done by Dr.  Corky [name].  A 65 year old female with multiple medical problems with a  history of COPD and ongoing tobacco abuse, nonobstructive coronary  artery disease recent catheterization in August 2008, was admitted to  the hospital with active wheezing and coughing.   The coughing was unproductive of sputum most of the time associated with  non-pleuritic chest pain and no diaphoresis.  The patient was admitted  for exacerbation of her COPD.  Accordingly, the patient was placed on  albuterol and Atrovent nebs with Solu-Medrol and Avelox IV.  A chest x-  ray does not show any evidence of pneumonia.  As the patient's  respiratory status improved during the hospitalization, tapered dose of  Solu-Medrol was in place.  As the  patient admitted she has a  hypersensitivity to prednisone p.o., she cannot tolerate her Solu-Medrol  __________  p.o.  The patient will be discharged with a home RN for  administration of intramuscularly Solu-  Medrol for 3 days, for a tapered dose of steroid.  The patient is to  continue with her neb treatment and also to continue Avelox p.o. for 1  week.  During the hospitalization, the patient's respiratory status  significantly improved and we felt the patient was medically stable to  be discharged home.      [name] Bosie Helper, MD  Electronically Signed     HIE/MEDQ  D:  03/03/2007  T:  03/03/2007  Job:  [id number]

## 2010-07-21 NOTE — Op Note (Signed)
NAME:  [name], [name]                    ACCOUNT NO.:  [id number]   MEDICAL RECORD NO.:  [id number]                   PATIENT TYPE:  AMB   LOCATION:  DAY                                  FACILITY:  [facility]   PHYSICIAN:  Kerrin Champagne, M.D.                DATE OF BIRTH:  May 06, 1945   DATE OF PROCEDURE:  01/12/2002  DATE OF DISCHARGE:                                 OPERATIVE REPORT   PREOPERATIVE DIAGNOSIS:  Right L3-4 herniated nucleus pulposus with right-  sided L3 and L4 nerve root encroachment, degenerative spondylolisthesis,  grade 1, L4-5.   POSTOPERATIVE DIAGNOSIS:  Right L3-4 herniated nucleus pulposus with right-  sided L3 and L4 nerve root encroachment, degenerative spondylolisthesis,  grade 38m L4-5.   PROCEDURE:  Microdiskectomy, right L3-4.   SURGEON:  Kerrin Champagne, M.D.   ASSISTANT:  Wende Neighbors, P.A.   ANESTHESIA:  General without complications, Mack Guise, M.D.   ESTIMATED BLOOD LOSS:  Less than 100 cc.   DRAINS:  None.   BRIEF CLINICAL HISTORY:  The patient is a 65 year old female who has had a  several-month history of back pain with radiation into her right leg.  Discomfort into the right L5-S1 distribution on the anterior lateral aspect  of the right thigh.  She underwent initial evaluation and was felt to have  an L5 radiculopathy.  MRI study, however, demonstrated disk protrusion at  the L3-4 level with nerve compression of right L4 and L3 nerve roots.  It  was felt that with conservative management, this was the most likely source  of her persistent pain into her left lower extremity.  She is brought to the  operating room after attempts at conservative management were unsuccessful  in relieving her pain.   INTRAOPERATIVE FINDINGS:  Disk protrusion, central leftward, into the left  L3-4 neural foramen, affecting the left L3 nerve root as well as central  leftward into the lateral recess on the left side, affecting the left L4 and  potentially the L5 nerve roots as they traverse this segment.   DESCRIPTION OF PROCEDURE:  After adequate general anesthesia, patient in  knee-chest position, Andrews frame, standard preoperative antibiotics of  Ancef.  Despite PENICILLIN allergy, she reports ability to take Keflex.  The  patient was placed into a knee-chest position on the [place] frame, all  pressure points well-padded.   Duraprep prep.  Draped in the usual manner.  Three spinal needles placed at  the expected L3-4 level.  Intraoperative lateral radiograph of the lumbar  spine demonstrated the upper needle at the L3 spinous process, the lower  needle at the upper end of the L4 spinous process, and the lower needle at  L4-5.  The area over the back then draped with an iodine Vi-Drape.  The skin  infiltrated with Marcaine 0.5% with 1:200,000 epinephrine 5 cc.  The  incision through skin and subcu layers approximately an inch and  a half in  length at the expected L3-4 level, through the skin and subcu layers, and  carried down to the lumbodorsal fascia.  An incision made on the left side  over the spinous process of L4 and L5.  Two Cobbs then used to elevate the  paralumbar muscles off the posterior aspect of the spinous process along the  right side and the posterior aspect of the lamina on the right at L3 and  over the laminar space on the right side, interlaminar space, at the L3-4  level.  Clamp placed on the spinous process of L3 and intraoperative lateral  radiograph demonstrated this to be on the L3 level.  Leksell rongeur was  then used to remove a small portion of the inferior aspect of the lamina on  the right side of L3 up to the insertion of the ligamentum flavum, a 3 mm  Kerrison used to further debride the lamina on the right side to expose the  insertion area of the ligamentum flavum and release this.  Ligamentum flavum  then elevated using a nerve hook and then debrided down to the superior  aspect of the  superior lamina of L4.  The nerve was moved off the lamina,  and the neural foramen on the right side for the 4 nerve root was  decompressed.  A partial facetectomy of approximately 10-15% carried out on  the right L3-4 level to decompress the right lateral recess, decompress the  right L4 nerve root.  This was done under loupe magnification initially,  then operating room microscope was brought into the field, draped sterilely.  D'Errico retractor used to retract the L4 nerve root and the lateral aspect  of the thecal sac.  Small epidural veins cauterized using bipolar  electrocautery.  Cottonoids placed above and below the disk space to allow  for further exposure and protection of the nerve roots.  The disk was noted  to be protruded posteriorly and into the right neural foramen, affecting the  right L3 nerve root in the central and lateral aspect of the thecal sac on  the right side.  A cruciate incision was made into the disk space using a 15  blade scalpel, protecting the nerve root and thecal sac appropriately.  Pituitary rongeurs were then used to debride the disk space, and herniated  disk material as well as smaller fragmented pieces of degenerative disk  material was within the posterior half of the disk on the right side.  Subligamentous disk material was noted to be present, including  cartilaginous remnants that were found immediately subligamentous on the  right and central.  Three pieces of disk material were removed from the  central portion of the subligamentous disk area.  Additional disk was found  to be extruded into the subligamentous area in the right neural foramen at  L3.  This was removed using first a hockey stick neural probe to express the  disk material subligamentous back into the disk space and then to remove it  appropriately using pituitary rongeurs.  The disk space debrided until no  further remaining fragment of disk could be removed from the right  side. Irrigation was performed.  Partial foraminotomy performed over the right L3  nerve root, decompressing the nerve to the point where a hockey stick neural  probe could be passed out over the nerve root posteriorly and anterior to  the nerve root, ensuring that the neural foramen is well-decompressed.  At  this point irrigation  was performed and bone wax applied to the bleeding  cancellous bone surfaces along the medial aspect of the sac.  Portions of  thrombin-soaked Gelfoam placed to obtain hemostasis, and these were removed  at the end of the case.  Bleeding was absolutely controlled, and closure was  performed, reapproximating the lumbodorsal fascia in the midline with  interrupted 0 Vicryl sutures, the deep subcu layers with interrupted 0  Vicryl suture, the more superficial layers with interrupted 2-0 Vicryl  sutures, and the skin closed with running subcu stitch of 4-0 Vicryl.  Tincture of Benzoin and Steri-Strips applied.  No surgical specimen was  presented for pathology.  It was felt that grossly this represented normal  appearance of disk material and cartilage end plate material.  The patient  was then reactivated and returned to the recovery room in satisfactory  condition.  All instrument and sponge counts were correct.                                                Kerrin Champagne, M.D.    Myra Rude  D:  01/12/2002  T:  01/12/2002  Job:  [id number]

## 2010-07-28 ENCOUNTER — Other Ambulatory Visit (HOSPITAL_COMMUNITY): Payer: Self-pay | Admitting: Neurosurgery

## 2010-07-28 DIAGNOSIS — M545 Low back pain: Secondary | ICD-10-CM

## 2010-08-02 ENCOUNTER — Ambulatory Visit (HOSPITAL_COMMUNITY)
Admission: RE | Admit: 2010-08-02 | Discharge: 2010-08-02 | Disposition: A | Discharge status: Home / Self Care | Payer: Medicare Other | Source: Ambulatory Visit | Attending: Neurosurgery | Admitting: Neurosurgery

## 2010-08-02 DIAGNOSIS — M51379 Other intervertebral disc degeneration, lumbosacral region without mention of lumbar back pain or lower extremity pain: Secondary | ICD-10-CM | POA: Insufficient documentation

## 2010-08-02 DIAGNOSIS — M545 Low back pain, unspecified: Secondary | ICD-10-CM | POA: Insufficient documentation

## 2010-08-02 DIAGNOSIS — M519 Unspecified thoracic, thoracolumbar and lumbosacral intervertebral disc disorder: Secondary | ICD-10-CM | POA: Insufficient documentation

## 2010-08-02 DIAGNOSIS — M5137 Other intervertebral disc degeneration, lumbosacral region: Secondary | ICD-10-CM | POA: Insufficient documentation

## 2010-08-02 DIAGNOSIS — M79609 Pain in unspecified limb: Secondary | ICD-10-CM | POA: Insufficient documentation

## 2010-08-02 LAB — CREATININE, SERUM
Creatinine, Ser: 0.79 mg/dL (ref 0.4–1.2)
GFR calc Af Amer: >60 mL/min (ref >60)
GFR calc non Af Amer: >60 mL/min (ref >60)

## 2010-08-02 MED ORDER — GADOBENATE DIMEGLUMINE 529 MG/ML IV SOLN
13.0000 mL | Freq: Once | INTRAVENOUS | Status: AC
  Administered 2010-08-02: 13 mL via INTRAVENOUS

## 2010-11-24 LAB — BASIC METABOLIC PANEL
BUN: 10
CO2: 30
Calcium: 9.4
Chloride: 100
Creatinine, Ser: 0.69
GFR calc Af Amer: >60
GFR calc non Af Amer: >60
Glucose, Bld: 125 — ABNORMAL HIGH
Potassium: 3.3 — ABNORMAL LOW
Sodium: 140

## 2010-11-24 LAB — CBC
HCT: 40.4
Hemoglobin: 14.2
MCHC: 35.2
MCV: 91.7
Platelets: 290
RBC: 4.41
RDW: 12.9
WBC: 12.7 — ABNORMAL HIGH

## 2010-11-24 LAB — TYPE AND SCREEN
ABO/RH(D): A POS
Antibody Screen: NEGATIVE

## 2010-11-24 LAB — ABO/RH: ABO/RH(D): A POS

## 2010-11-27 LAB — COMPREHENSIVE METABOLIC PANEL
ALT: 31
AST: 28
Albumin: 3.7
Alkaline Phosphatase: 75
BUN: 6
CO2: 30
Calcium: 9.1
Chloride: 99
Creatinine, Ser: 0.74
GFR calc Af Amer: >60
GFR calc non Af Amer: >60
Glucose, Bld: 117 — ABNORMAL HIGH
Potassium: 3.6
Sodium: 137
Total Bilirubin: 0.7
Total Protein: 6.7

## 2010-11-27 LAB — BASIC METABOLIC PANEL
BUN: 10
CO2: 28
Calcium: 7.6 — ABNORMAL LOW
Chloride: 104
Creatinine, Ser: 0.7
GFR calc Af Amer: >60
GFR calc non Af Amer: >60
Glucose, Bld: 134 — ABNORMAL HIGH
Potassium: 4.1
Sodium: 136

## 2010-11-27 LAB — TYPE AND SCREEN
ABO/RH(D): A POS
Antibody Screen: NEGATIVE

## 2010-11-27 LAB — CBC
HCT: 30.6 — ABNORMAL LOW
HCT: 39.5
Hemoglobin: 10.7 — ABNORMAL LOW
Hemoglobin: 13.6
MCHC: 34.5
MCHC: 35
MCV: 92.7
MCV: 93
Platelets: 233
Platelets: 318
RBC: 3.29 — ABNORMAL LOW
RBC: 4.26
RDW: 12.8
RDW: 13.2
WBC: 12.4 — ABNORMAL HIGH
WBC: 15.4 — ABNORMAL HIGH

## 2010-11-27 LAB — POCT I-STAT GLUCOSE
Glucose, Bld: 98
Operator id: 125961

## 2010-12-12 LAB — COMPREHENSIVE METABOLIC PANEL
ALT: 21
AST: 24
Albumin: <1 — ABNORMAL LOW
Alkaline Phosphatase: 75
BUN: 13
CO2: 27
Calcium: 9
Chloride: 97
Creatinine, Ser: 0.83
GFR calc Af Amer: >60
GFR calc non Af Amer: >60
Glucose, Bld: 156 — ABNORMAL HIGH
Potassium: 3.3 — ABNORMAL LOW
Sodium: 135
Total Bilirubin: 0.7
Total Protein: 7.3

## 2010-12-12 LAB — CBC
HCT: 40.7
HCT: 41.5
Hemoglobin: 13.9
Hemoglobin: 14.3
MCHC: 34.3
MCHC: 34.4
MCV: 91.8
MCV: 92.2
Platelets: 292
Platelets: 323
RBC: 4.41
RBC: 4.52
RDW: 13.1
RDW: 13.2
WBC: 10.1
WBC: 14.1 — ABNORMAL HIGH

## 2010-12-12 LAB — DIFFERENTIAL
Basophils Absolute: 0
Basophils Absolute: 0.1
Basophils Relative: 0
Basophils Relative: 0
Eosinophils Absolute: 0 — ABNORMAL LOW
Eosinophils Absolute: 0.1 — ABNORMAL LOW
Eosinophils Relative: 0
Eosinophils Relative: 1
Lymphocytes Relative: 16
Lymphocytes Relative: 6 — ABNORMAL LOW
Lymphs Abs: 0.6 — ABNORMAL LOW
Lymphs Abs: 2.2
Monocytes Absolute: 0 — ABNORMAL LOW
Monocytes Absolute: 0.6
Monocytes Relative: 1 — ABNORMAL LOW
Monocytes Relative: 4
Neutro Abs: 11.1 — ABNORMAL HIGH
Neutro Abs: 9.4 — ABNORMAL HIGH
Neutrophils Relative %: 79 — ABNORMAL HIGH
Neutrophils Relative %: 93 — ABNORMAL HIGH

## 2010-12-12 LAB — HEMOGLOBIN A1C
Hgb A1c MFr Bld: 7.2 — ABNORMAL HIGH
Mean Plasma Glucose: 179

## 2010-12-12 LAB — BASIC METABOLIC PANEL
BUN: 12
CO2: 23
Calcium: 8.5
Chloride: 99
Creatinine, Ser: 0.81
GFR calc Af Amer: >60
GFR calc non Af Amer: >60
Glucose, Bld: 233 — ABNORMAL HIGH
Potassium: 4
Sodium: 131 — ABNORMAL LOW

## 2010-12-12 LAB — URINALYSIS, ROUTINE W REFLEX MICROSCOPIC
Bilirubin Urine: NEGATIVE
Glucose, UA: NEGATIVE
Hgb urine dipstick: NEGATIVE
Ketones, ur: NEGATIVE
Nitrite: NEGATIVE
Protein, ur: NEGATIVE
Specific Gravity, Urine: 1.009
Urobilinogen, UA: 0.2
pH: 6

## 2010-12-12 LAB — URINE CULTURE: Colony Count: >=100000

## 2010-12-12 LAB — ALBUMIN: Albumin: 3.4 — ABNORMAL LOW

## 2010-12-12 LAB — B-NATRIURETIC PEPTIDE (CONVERTED LAB): Pro B Natriuretic peptide (BNP): 62

## 2010-12-12 LAB — PREALBUMIN: Prealbumin: 20.2

## 2010-12-19 LAB — I-STAT 8, (EC8 V) (CONVERTED LAB)
Acid-Base Excess: 4 — ABNORMAL HIGH
BUN: 13
Bicarbonate: 27.6 — ABNORMAL HIGH
Chloride: 101
Glucose, Bld: 153 — ABNORMAL HIGH
HCT: 43
Hemoglobin: 14.6
Operator id: 189501
Potassium: 3.4 — ABNORMAL LOW
Sodium: 133 — ABNORMAL LOW
TCO2: 29
pCO2, Ven: 38.3 — ABNORMAL LOW
pH, Ven: 7.465 — ABNORMAL HIGH

## 2010-12-19 LAB — LIPID PANEL
Cholesterol: 271 — ABNORMAL HIGH
HDL: 31 — ABNORMAL LOW
LDL Cholesterol: 180 — ABNORMAL HIGH
Total CHOL/HDL Ratio: 8.7
Triglycerides: 300 — ABNORMAL HIGH
VLDL: 60 — ABNORMAL HIGH

## 2010-12-19 LAB — POCT CARDIAC MARKERS
CKMB, poc: 1.3
Myoglobin, poc: 61.4
Operator id: 189501
Troponin i, poc: <0.05

## 2010-12-19 LAB — CBC
HCT: 37.6
HCT: 38.6
HCT: 40.8
Hemoglobin: 12.9
Hemoglobin: 13.3
Hemoglobin: 13.8
MCHC: 33.7
MCHC: 34.2
MCHC: 34.5
MCV: 93.3
MCV: 94.2
MCV: 94.3
Platelets: 280
Platelets: 301
Platelets: 317
RBC: 3.99
RBC: 4.14
RBC: 4.34
RDW: 13.1
RDW: 13.4
RDW: 13.4
WBC: 12.2 — ABNORMAL HIGH
WBC: 9.5
WBC: 9.7

## 2010-12-19 LAB — HEPARIN LEVEL (UNFRACTIONATED)
Heparin Unfractionated: 0.25 — ABNORMAL LOW
Heparin Unfractionated: 0.3
Heparin Unfractionated: 0.55
Heparin Unfractionated: 0.55

## 2010-12-19 LAB — HEMOGLOBIN A1C: Hgb A1c MFr Bld: 6.4 — ABNORMAL HIGH

## 2010-12-19 LAB — DIFFERENTIAL
Basophils Absolute: 0.1
Basophils Relative: 1
Eosinophils Absolute: 0.5
Eosinophils Relative: 4
Lymphocytes Relative: 30
Lymphs Abs: 3.6 — ABNORMAL HIGH
Monocytes Absolute: 0.7
Monocytes Relative: 6
Neutro Abs: 7.2
Neutrophils Relative %: 59

## 2010-12-19 LAB — TROPONIN I
Troponin I: 0.01
Troponin I: 0.02

## 2010-12-19 LAB — CK TOTAL AND CKMB (NOT AT ARMC)
CK, MB: 1.2
CK, MB: 1.2
CK, MB: 1.4
Relative Index: INVALID
Relative Index: INVALID
Relative Index: INVALID
Total CK: 35
Total CK: 52
Total CK: 55

## 2010-12-19 LAB — MAGNESIUM: Magnesium: 2

## 2010-12-19 LAB — APTT: aPTT: 77 — ABNORMAL HIGH

## 2010-12-19 LAB — TSH: TSH: 6.854 — ABNORMAL HIGH

## 2010-12-19 LAB — POCT I-STAT CREATININE
Creatinine, Ser: 0.8
Operator id: 189501

## 2010-12-19 LAB — B-NATRIURETIC PEPTIDE (CONVERTED LAB): Pro B Natriuretic peptide (BNP): <30

## 2010-12-19 LAB — PROTIME-INR
INR: 1
Prothrombin Time: 13.1

## 2011-03-15 ENCOUNTER — Other Ambulatory Visit: Payer: Self-pay | Admitting: Gastroenterology

## 2011-03-15 DIAGNOSIS — R1011 Right upper quadrant pain: Secondary | ICD-10-CM

## 2011-03-15 DIAGNOSIS — R110 Nausea: Secondary | ICD-10-CM

## 2011-03-22 ENCOUNTER — Encounter (HOSPITAL_COMMUNITY)
Admission: RE | Admit: 2011-03-22 | Discharge: 2011-03-22 | Disposition: A | Discharge status: Home / Self Care | Payer: Medicare Other | Source: Ambulatory Visit | Attending: Gastroenterology | Admitting: Gastroenterology

## 2011-03-22 DIAGNOSIS — R1011 Right upper quadrant pain: Secondary | ICD-10-CM | POA: Insufficient documentation

## 2011-03-22 DIAGNOSIS — R110 Nausea: Secondary | ICD-10-CM | POA: Insufficient documentation

## 2011-03-22 MED ORDER — TECHNETIUM TC 99M MEBROFENIN IV KIT
5.0000 | PACK | Freq: Once | INTRAVENOUS | Status: AC | PRN
  Administered 2011-03-22: 5 via INTRAVENOUS

## 2011-03-22 MED ORDER — SINCALIDE 5 MCG IJ SOLR
INTRAMUSCULAR | Status: AC
  Administered 2011-03-22: 1.24 ug
  Filled 2011-03-22: qty 5

## 2011-08-16 ENCOUNTER — Other Ambulatory Visit (HOSPITAL_COMMUNITY): Payer: Self-pay | Admitting: Neurosurgery

## 2011-08-16 DIAGNOSIS — M549 Dorsalgia, unspecified: Secondary | ICD-10-CM

## 2011-08-21 ENCOUNTER — Ambulatory Visit (HOSPITAL_COMMUNITY)
Admission: RE | Admit: 2011-08-21 | Discharge: 2011-08-21 | Disposition: A | Discharge status: Home / Self Care | Payer: Medicare Other | Source: Ambulatory Visit | Attending: Neurosurgery | Admitting: Neurosurgery

## 2011-08-21 DIAGNOSIS — M549 Dorsalgia, unspecified: Secondary | ICD-10-CM

## 2011-08-21 DIAGNOSIS — R262 Difficulty in walking, not elsewhere classified: Secondary | ICD-10-CM | POA: Insufficient documentation

## 2011-08-21 DIAGNOSIS — M6281 Muscle weakness (generalized): Secondary | ICD-10-CM | POA: Insufficient documentation

## 2011-08-21 DIAGNOSIS — M47817 Spondylosis without myelopathy or radiculopathy, lumbosacral region: Secondary | ICD-10-CM | POA: Insufficient documentation

## 2011-08-21 DIAGNOSIS — M79609 Pain in unspecified limb: Secondary | ICD-10-CM | POA: Insufficient documentation

## 2011-08-21 LAB — CREATININE, SERUM
Creatinine, Ser: 0.72 mg/dL (ref 0.50–1.10)
GFR calc Af Amer: >90 mL/min (ref >90)
GFR calc non Af Amer: 88 mL/min — ABNORMAL LOW (ref >90)

## 2011-08-21 MED ORDER — GADOBENATE DIMEGLUMINE 529 MG/ML IV SOLN
13.0000 mL | Freq: Once | INTRAVENOUS | Status: AC
  Administered 2011-08-21: 13 mL via INTRAVENOUS

## 2011-08-21 MED ORDER — DEXAMETHASONE SODIUM PHOSPHATE 10 MG/ML IJ SOLN
INTRAMUSCULAR | Status: AC
  Filled 2011-08-21: qty 1

## 2011-08-21 MED ORDER — DEXAMETHASONE SODIUM PHOSPHATE 10 MG/ML IJ SOLN
10.0000 mg | Freq: Once | INTRAMUSCULAR | Status: AC
  Administered 2011-08-21: 10 mg via INTRAVENOUS

## 2011-08-21 MED ORDER — DIPHENHYDRAMINE HCL 50 MG/ML IJ SOLN
INTRAMUSCULAR | Status: AC
  Administered 2011-08-21: 50 mg
  Filled 2011-08-21: qty 1

## 2011-08-21 MED ORDER — DIPHENHYDRAMINE HCL 50 MG/ML IJ SOLN: 50.0000 mg | Freq: Once | INTRAMUSCULAR | Status: DC

## 2011-11-01 DIAGNOSIS — N3946 Mixed incontinence: Secondary | ICD-10-CM | POA: Insufficient documentation

## 2011-12-24 ENCOUNTER — Ambulatory Visit
Admission: RE | Admit: 2011-12-24 | Discharge: 2011-12-24 | Disposition: A | Discharge status: Home / Self Care | Payer: Medicare Other | Source: Ambulatory Visit | Attending: Internal Medicine | Admitting: Internal Medicine

## 2011-12-24 ENCOUNTER — Other Ambulatory Visit: Payer: Self-pay | Admitting: Internal Medicine

## 2011-12-24 DIAGNOSIS — R05 Cough: Secondary | ICD-10-CM

## 2011-12-24 DIAGNOSIS — R059 Cough, unspecified: Secondary | ICD-10-CM

## 2011-12-24 DIAGNOSIS — F17200 Nicotine dependence, unspecified, uncomplicated: Secondary | ICD-10-CM

## 2012-01-01 ENCOUNTER — Other Ambulatory Visit: Payer: Self-pay | Admitting: Neurosurgery

## 2012-01-01 DIAGNOSIS — M542 Cervicalgia: Secondary | ICD-10-CM

## 2012-01-02 ENCOUNTER — Ambulatory Visit
Admission: RE | Admit: 2012-01-02 | Discharge: 2012-01-02 | Disposition: A | Discharge status: Home / Self Care | Payer: Medicare Other | Source: Ambulatory Visit | Attending: Neurosurgery | Admitting: Neurosurgery

## 2012-01-02 DIAGNOSIS — M542 Cervicalgia: Secondary | ICD-10-CM

## 2012-01-09 ENCOUNTER — Other Ambulatory Visit: Payer: Self-pay | Admitting: Neurosurgery

## 2012-01-09 DIAGNOSIS — M5410 Radiculopathy, site unspecified: Secondary | ICD-10-CM

## 2012-01-09 DIAGNOSIS — M542 Cervicalgia: Secondary | ICD-10-CM

## 2012-01-10 ENCOUNTER — Ambulatory Visit
Admission: RE | Admit: 2012-01-10 | Discharge: 2012-01-10 | Disposition: A | Discharge status: Home / Self Care | Payer: Medicare Other | Source: Ambulatory Visit | Attending: Neurosurgery | Admitting: Neurosurgery

## 2012-01-10 DIAGNOSIS — M5410 Radiculopathy, site unspecified: Secondary | ICD-10-CM

## 2012-01-10 DIAGNOSIS — M542 Cervicalgia: Secondary | ICD-10-CM

## 2012-01-10 MED ORDER — IOHEXOL 300 MG/ML  SOLN
1.0000 mL | Freq: Once | INTRAMUSCULAR | Status: AC | PRN
  Administered 2012-01-10: 1 mL via INTRA_ARTICULAR

## 2012-01-10 MED ORDER — DEXAMETHASONE SODIUM PHOSPHATE 4 MG/ML IJ SOLN
4.0000 mg | Freq: Once | INTRAMUSCULAR | Status: AC
  Administered 2012-01-10: 4 mg via INTRA_ARTICULAR

## 2012-01-15 ENCOUNTER — Other Ambulatory Visit: Payer: Medicare Other

## 2012-01-16 ENCOUNTER — Other Ambulatory Visit: Payer: Self-pay | Admitting: Neurosurgery

## 2012-01-16 ENCOUNTER — Encounter (HOSPITAL_COMMUNITY): Payer: Self-pay

## 2012-01-21 ENCOUNTER — Encounter (HOSPITAL_COMMUNITY)
Admission: RE | Admit: 2012-01-21 | Discharge: 2012-01-21 | Disposition: A | Discharge status: Home / Self Care | Payer: Medicare Other | Source: Ambulatory Visit | Attending: Neurosurgery | Admitting: Neurosurgery

## 2012-01-21 ENCOUNTER — Encounter (HOSPITAL_COMMUNITY): Payer: Self-pay

## 2012-01-21 HISTORY — DX: Personal history of urinary (tract) infections: Z87.440

## 2012-01-21 HISTORY — DX: Reserved for concepts with insufficient information to code with codable children: IMO0002

## 2012-01-21 HISTORY — DX: Cervicalgia: M54.2

## 2012-01-21 HISTORY — DX: Systemic lupus erythematosus, unspecified: M32.9

## 2012-01-21 HISTORY — DX: Polyneuropathy, unspecified: G62.9

## 2012-01-21 HISTORY — DX: Essential (primary) hypertension: I10

## 2012-01-21 HISTORY — DX: Depression, unspecified: F32.A

## 2012-01-21 HISTORY — DX: Major depressive disorder, single episode, unspecified: F32.9

## 2012-01-21 HISTORY — DX: Hypothyroidism, unspecified: E03.9

## 2012-01-21 HISTORY — DX: Personal history of other medical treatment: Z92.89

## 2012-01-21 HISTORY — DX: Simple chronic bronchitis: J41.0

## 2012-01-21 HISTORY — DX: Frequency of micturition: R35.0

## 2012-01-21 HISTORY — DX: Fibromyalgia: M79.7

## 2012-01-21 HISTORY — DX: Insomnia, unspecified: G47.00

## 2012-01-21 HISTORY — DX: Hyperlipidemia, unspecified: E78.5

## 2012-01-21 HISTORY — DX: Pneumonia, unspecified organism: J18.9

## 2012-01-21 LAB — BASIC METABOLIC PANEL
BUN: 13 mg/dL (ref 6–23)
CO2: 27 mEq/L (ref 19–32)
Calcium: 9.4 mg/dL (ref 8.4–10.5)
Chloride: 95 mEq/L — ABNORMAL LOW (ref 96–112)
Creatinine, Ser: 0.72 mg/dL (ref 0.50–1.10)
GFR calc Af Amer: >90 mL/min (ref >90)
GFR calc non Af Amer: 87 mL/min — ABNORMAL LOW (ref >90)
Glucose, Bld: 175 mg/dL — ABNORMAL HIGH (ref 70–99)
Potassium: 3.4 mEq/L — ABNORMAL LOW (ref 3.5–5.1)
Sodium: 135 mEq/L (ref 135–145)

## 2012-01-21 LAB — CBC
HCT: 40.8 % (ref 36.0–46.0)
Hemoglobin: 14.4 g/dL (ref 12.0–15.0)
MCH: 31.7 pg (ref 26.0–34.0)
MCHC: 35.3 g/dL (ref 30.0–36.0)
MCV: 89.9 fL (ref 78.0–100.0)
Platelets: 257 10*3/uL (ref 150–400)
RBC: 4.54 MIL/uL (ref 3.87–5.11)
RDW: 12.9 % (ref 11.5–15.5)
WBC: 14.1 10*3/uL — ABNORMAL HIGH (ref 4.0–10.5)

## 2012-01-21 LAB — SURGICAL PCR SCREEN
MRSA, PCR: NEGATIVE
Staphylococcus aureus: NEGATIVE

## 2012-01-21 NOTE — Pre-Procedure Instructions (Signed)
[address] [place]  01/21/2012   Your procedure is scheduled on:  Thurs, Nov 21 @ 11:18 AM  Report to [facility] at 8:00 AM.  Call this number if you have problems the morning of surgery: [phone]   Remember:   Do not eat food:After Midnight.  Take these medicines the morning of surgery with A SIP OF WATER: Albuterol<Bring Your Inhaler With You>,Gabapentin(Neurontin),Levothyroxine(Synthroid),Nitrofurantoin(Macrodantin),Pain Pill(if needed), and Sertraline(Zoloft)   Do not wear jewelry, make-up or nail polish.  Do not wear lotions, powders, or perfumes. You may wear deodorant.  Do not shave 48 hours prior to surgery.   Do not bring valuables to the hospital.  Contacts, dentures or bridgework may not be worn into surgery.  Leave suitcase in the car. After surgery it may be brought to your room.  For patients admitted to the hospital, checkout time is 11:00 AM the day of discharge.   Patients discharged the day of surgery will not be allowed to drive home.    Special Instructions: Shower using CHG 2 nights before surgery and the night before surgery.  If you shower the day of surgery use CHG.  Use special wash - you have one bottle of CHG for all showers.  You should use approximately 1/3 of the bottle for each shower.   Please read over the following fact sheets that you were given: Pain Booklet, Coughing and Deep Breathing, MRSA Information and Surgical Site Infection Prevention

## 2012-01-21 NOTE — Progress Notes (Signed)
Saw Dr.Bensimon in July 2008 d/t having some chest pain   Heart cath done in 2008 with reports in epic Echo in 2008-to request report Denies ever having a stress test  Dr.Kimberly Renae Gloss is medical md  CXR done about a month ago with [facility]-to request report  Denies ekg being done within past yr

## 2012-01-21 NOTE — Progress Notes (Signed)
Dr.Ronald Earlene Plater is urologist

## 2012-01-23 MED ORDER — VANCOMYCIN HCL IN DEXTROSE 1-5 GM/200ML-% IV SOLN
1000.0000 mg | INTRAVENOUS | Status: AC
  Administered 2012-01-24: 1000 mg via INTRAVENOUS
  Filled 2012-01-23: qty 200

## 2012-01-24 ENCOUNTER — Encounter (HOSPITAL_COMMUNITY): Payer: Self-pay | Admitting: Certified Registered"

## 2012-01-24 ENCOUNTER — Inpatient Hospital Stay (HOSPITAL_COMMUNITY)
Admission: RE | Admit: 2012-01-24 | Discharge: 2012-01-25 | DRG: 473 | Disposition: A | Discharge status: Home / Self Care | Payer: Medicare Other | Source: Ambulatory Visit | Attending: Neurosurgery | Admitting: Neurosurgery

## 2012-01-24 ENCOUNTER — Encounter (HOSPITAL_COMMUNITY): Payer: Self-pay | Admitting: Anesthesiology

## 2012-01-24 ENCOUNTER — Ambulatory Visit (HOSPITAL_COMMUNITY): Payer: Medicare Other | Admitting: Anesthesiology

## 2012-01-24 ENCOUNTER — Encounter (HOSPITAL_COMMUNITY): Payer: Self-pay | Admitting: *Deleted

## 2012-01-24 ENCOUNTER — Encounter (HOSPITAL_COMMUNITY)
Admission: RE | Disposition: A | Discharge status: Home / Self Care | Payer: Self-pay | Source: Ambulatory Visit | Attending: Neurosurgery

## 2012-01-24 ENCOUNTER — Ambulatory Visit (HOSPITAL_COMMUNITY): Payer: Medicare Other

## 2012-01-24 DIAGNOSIS — IMO0001 Reserved for inherently not codable concepts without codable children: Secondary | ICD-10-CM | POA: Diagnosis present

## 2012-01-24 DIAGNOSIS — M5030 Other cervical disc degeneration, unspecified cervical region: Secondary | ICD-10-CM | POA: Diagnosis present

## 2012-01-24 DIAGNOSIS — I10 Essential (primary) hypertension: Secondary | ICD-10-CM | POA: Diagnosis present

## 2012-01-24 DIAGNOSIS — E039 Hypothyroidism, unspecified: Secondary | ICD-10-CM | POA: Diagnosis present

## 2012-01-24 DIAGNOSIS — M47812 Spondylosis without myelopathy or radiculopathy, cervical region: Principal | ICD-10-CM | POA: Diagnosis present

## 2012-01-24 DIAGNOSIS — E119 Type 2 diabetes mellitus without complications: Secondary | ICD-10-CM | POA: Diagnosis present

## 2012-01-24 DIAGNOSIS — M4722 Other spondylosis with radiculopathy, cervical region: Secondary | ICD-10-CM

## 2012-01-24 DIAGNOSIS — Z01812 Encounter for preprocedural laboratory examination: Secondary | ICD-10-CM

## 2012-01-24 DIAGNOSIS — F17200 Nicotine dependence, unspecified, uncomplicated: Secondary | ICD-10-CM | POA: Diagnosis present

## 2012-01-24 DIAGNOSIS — E785 Hyperlipidemia, unspecified: Secondary | ICD-10-CM | POA: Diagnosis present

## 2012-01-24 DIAGNOSIS — J410 Simple chronic bronchitis: Secondary | ICD-10-CM | POA: Diagnosis present

## 2012-01-24 HISTORY — DX: Unspecified asthma, uncomplicated: J45.909

## 2012-01-24 HISTORY — PX: ANTERIOR CERVICAL DECOMP/DISCECTOMY FUSION: SHX1161

## 2012-01-24 HISTORY — DX: Other chronic pain: G89.29

## 2012-01-24 HISTORY — DX: Dorsalgia, unspecified: M54.9

## 2012-01-24 HISTORY — DX: Chronic obstructive pulmonary disease, unspecified: J44.9

## 2012-01-24 HISTORY — DX: Unspecified osteoarthritis, unspecified site: M19.90

## 2012-01-24 HISTORY — DX: Unspecified chronic bronchitis: J42

## 2012-01-24 HISTORY — DX: Type 2 diabetes mellitus without complications: E11.9

## 2012-01-24 LAB — GLUCOSE, CAPILLARY
Glucose-Capillary: 184 mg/dL — ABNORMAL HIGH (ref 70–99)
Glucose-Capillary: 144 mg/dL — ABNORMAL HIGH (ref 70–99)

## 2012-01-24 SURGERY — ANTERIOR CERVICAL DECOMPRESSION/DISCECTOMY FUSION 1 LEVEL
Anesthesia: General | Wound class: Clean

## 2012-01-24 MED ORDER — SIMVASTATIN 20 MG PO TABS
20.0000 mg | ORAL_TABLET | Freq: Every day | ORAL | Status: DC
  Administered 2012-01-24: 20 mg via ORAL
  Filled 2012-01-24 (×2): qty 1

## 2012-01-24 MED ORDER — ONDANSETRON HCL 4 MG/2ML IJ SOLN: 4.0000 mg | Freq: Once | INTRAMUSCULAR | Status: DC | PRN

## 2012-01-24 MED ORDER — HYDROMORPHONE HCL PF 1 MG/ML IJ SOLN
0.2500 mg | INTRAMUSCULAR | Status: DC | PRN
  Administered 2012-01-24 (×2): 0.5 mg via INTRAVENOUS

## 2012-01-24 MED ORDER — SERTRALINE HCL 100 MG PO TABS
100.0000 mg | ORAL_TABLET | Freq: Every day | ORAL | Status: DC
  Administered 2012-01-25: 100 mg via ORAL
  Filled 2012-01-24: qty 1

## 2012-01-24 MED ORDER — DIAZEPAM 5 MG PO TABS
5.0000 mg | ORAL_TABLET | Freq: Four times a day (QID) | ORAL | Status: DC | PRN
  Administered 2012-01-24 – 2012-01-25 (×2): 5 mg via ORAL
  Filled 2012-01-24 (×2): qty 1

## 2012-01-24 MED ORDER — LACTATED RINGERS IV SOLN
INTRAVENOUS | Status: DC | PRN
  Administered 2012-01-24 (×2): via INTRAVENOUS

## 2012-01-24 MED ORDER — GABAPENTIN 300 MG PO CAPS
300.0000 mg | ORAL_CAPSULE | Freq: Every day | ORAL | Status: DC
  Administered 2012-01-25: 300 mg via ORAL
  Filled 2012-01-24: qty 1

## 2012-01-24 MED ORDER — ONDANSETRON HCL 4 MG/2ML IJ SOLN
INTRAMUSCULAR | Status: DC | PRN
  Administered 2012-01-24: 4 mg via INTRAVENOUS

## 2012-01-24 MED ORDER — TRIAMTERENE-HCTZ 37.5-25 MG PO TABS
1.0000 | ORAL_TABLET | Freq: Every day | ORAL | Status: DC
  Administered 2012-01-25: 1 via ORAL
  Filled 2012-01-24: qty 1

## 2012-01-24 MED ORDER — MIDAZOLAM HCL 5 MG/5ML IJ SOLN
INTRAMUSCULAR | Status: DC | PRN
  Administered 2012-01-24: 2 mg via INTRAVENOUS

## 2012-01-24 MED ORDER — MENTHOL 3 MG MT LOZG
1.0000 | LOZENGE | OROMUCOSAL | Status: DC | PRN
  Filled 2012-01-24: qty 18
  Filled 2012-01-24: qty 9

## 2012-01-24 MED ORDER — SODIUM CHLORIDE 0.9 % IJ SOLN: 3.0000 mL | INTRAMUSCULAR | Status: DC | PRN

## 2012-01-24 MED ORDER — HYDROMORPHONE HCL PF 1 MG/ML IJ SOLN
INTRAMUSCULAR | Status: AC
  Filled 2012-01-24: qty 1

## 2012-01-24 MED ORDER — FENTANYL CITRATE 0.05 MG/ML IJ SOLN
INTRAMUSCULAR | Status: DC | PRN
  Administered 2012-01-24 (×2): 100 ug via INTRAVENOUS
  Administered 2012-01-24: 50 ug via INTRAVENOUS

## 2012-01-24 MED ORDER — ZOLPIDEM TARTRATE 5 MG PO TABS: 5.0000 mg | ORAL_TABLET | Freq: Every evening | ORAL | Status: DC | PRN

## 2012-01-24 MED ORDER — OXYCODONE-ACETAMINOPHEN 10-325 MG PO TABS: 1.0000 | ORAL_TABLET | ORAL | Status: DC | PRN

## 2012-01-24 MED ORDER — 0.9 % SODIUM CHLORIDE (POUR BTL) OPTIME
TOPICAL | Status: DC | PRN
  Administered 2012-01-24: 1000 mL

## 2012-01-24 MED ORDER — SODIUM CHLORIDE 0.9 % IJ SOLN
3.0000 mL | Freq: Two times a day (BID) | INTRAMUSCULAR | Status: DC
  Administered 2012-01-25: 3 mL via INTRAVENOUS

## 2012-01-24 MED ORDER — DEXAMETHASONE SODIUM PHOSPHATE 4 MG/ML IJ SOLN: 4.0000 mg | Freq: Four times a day (QID) | INTRAMUSCULAR | Status: AC

## 2012-01-24 MED ORDER — DEXAMETHASONE 4 MG PO TABS
4.0000 mg | ORAL_TABLET | Freq: Four times a day (QID) | ORAL | Status: AC
  Administered 2012-01-24 – 2012-01-25 (×3): 4 mg via ORAL
  Filled 2012-01-24 (×3): qty 1

## 2012-01-24 MED ORDER — ACETAMINOPHEN 325 MG PO TABS: 650.0000 mg | ORAL_TABLET | ORAL | Status: DC | PRN

## 2012-01-24 MED ORDER — ARTIFICIAL TEARS OP OINT
TOPICAL_OINTMENT | OPHTHALMIC | Status: DC | PRN
  Administered 2012-01-24: 1 via OPHTHALMIC

## 2012-01-24 MED ORDER — ONDANSETRON HCL 4 MG/2ML IJ SOLN: 4.0000 mg | INTRAMUSCULAR | Status: DC | PRN

## 2012-01-24 MED ORDER — LACTATED RINGERS IV SOLN: INTRAVENOUS | Status: DC

## 2012-01-24 MED ORDER — METFORMIN HCL 500 MG PO TABS
1000.0000 mg | ORAL_TABLET | Freq: Every day | ORAL | Status: DC
  Administered 2012-01-24 – 2012-01-25 (×2): 1000 mg via ORAL
  Filled 2012-01-24 (×2): qty 2

## 2012-01-24 MED ORDER — EPHEDRINE SULFATE 50 MG/ML IJ SOLN
INTRAMUSCULAR | Status: DC | PRN
  Administered 2012-01-24: 5 mg via INTRAVENOUS
  Administered 2012-01-24 (×2): 10 mg via INTRAVENOUS

## 2012-01-24 MED ORDER — SODIUM CHLORIDE 0.9 % IV SOLN
INTRAVENOUS | Status: AC
  Filled 2012-01-24: qty 500

## 2012-01-24 MED ORDER — ALBUTEROL SULFATE HFA 108 (90 BASE) MCG/ACT IN AERS
2.0000 | INHALATION_SPRAY | Freq: Four times a day (QID) | RESPIRATORY_TRACT | Status: DC | PRN
  Filled 2012-01-24: qty 6.7

## 2012-01-24 MED ORDER — ROCURONIUM BROMIDE 100 MG/10ML IV SOLN
INTRAVENOUS | Status: DC | PRN
  Administered 2012-01-24: 50 mg via INTRAVENOUS

## 2012-01-24 MED ORDER — DOCUSATE SODIUM 100 MG PO CAPS
100.0000 mg | ORAL_CAPSULE | Freq: Two times a day (BID) | ORAL | Status: DC
  Administered 2012-01-24: 100 mg via ORAL
  Filled 2012-01-24: qty 1

## 2012-01-24 MED ORDER — LINAGLIPTIN 5 MG PO TABS
5.0000 mg | ORAL_TABLET | Freq: Every day | ORAL | Status: DC
  Administered 2012-01-25: 5 mg via ORAL
  Filled 2012-01-24: qty 1

## 2012-01-24 MED ORDER — LIDOCAINE HCL (CARDIAC) 20 MG/ML IV SOLN
INTRAVENOUS | Status: DC | PRN
  Administered 2012-01-24: 100 mg via INTRAVENOUS

## 2012-01-24 MED ORDER — HYDROMORPHONE HCL PF 1 MG/ML IJ SOLN: 0.2500 mg | INTRAMUSCULAR | Status: DC | PRN

## 2012-01-24 MED ORDER — HEMOSTATIC AGENTS (NO CHARGE) OPTIME
TOPICAL | Status: DC | PRN
  Administered 2012-01-24: 1 via TOPICAL

## 2012-01-24 MED ORDER — HYDROCODONE-ACETAMINOPHEN 5-325 MG PO TABS: 1.0000 | ORAL_TABLET | ORAL | Status: DC | PRN

## 2012-01-24 MED ORDER — ACETAMINOPHEN 650 MG RE SUPP: 650.0000 mg | RECTAL | Status: DC | PRN

## 2012-01-24 MED ORDER — BACITRACIN ZINC 500 UNIT/GM EX OINT
TOPICAL_OINTMENT | CUTANEOUS | Status: DC | PRN
  Administered 2012-01-24: 1 via TOPICAL

## 2012-01-24 MED ORDER — LEVOTHYROXINE SODIUM 112 MCG PO TABS
112.0000 ug | ORAL_TABLET | Freq: Every day | ORAL | Status: DC
  Administered 2012-01-25: 112 ug via ORAL
  Filled 2012-01-24 (×2): qty 1

## 2012-01-24 MED ORDER — OXYCODONE-ACETAMINOPHEN 5-325 MG PO TABS
1.0000 | ORAL_TABLET | ORAL | Status: DC | PRN
  Administered 2012-01-24: 2 via ORAL
  Administered 2012-01-24: 1 via ORAL
  Administered 2012-01-25 (×2): 2 via ORAL
  Filled 2012-01-24 (×4): qty 2

## 2012-01-24 MED ORDER — SITAGLIPTIN PHOS-METFORMIN HCL 50-1000 MG PO TABS: 1.0000 | ORAL_TABLET | Freq: Every day | ORAL | Status: DC

## 2012-01-24 MED ORDER — LIDOCAINE HCL 4 % MT SOLN
OROMUCOSAL | Status: DC | PRN
  Administered 2012-01-24: 4 mL via TOPICAL

## 2012-01-24 MED ORDER — PHENYLEPHRINE HCL 10 MG/ML IJ SOLN
INTRAMUSCULAR | Status: DC | PRN
  Administered 2012-01-24 (×2): 80 ug via INTRAVENOUS
  Administered 2012-01-24: 40 ug via INTRAVENOUS
  Administered 2012-01-24: 80 ug via INTRAVENOUS
  Administered 2012-01-24: 40 ug via INTRAVENOUS

## 2012-01-24 MED ORDER — THROMBIN 5000 UNITS EX SOLR
CUTANEOUS | Status: DC | PRN
  Administered 2012-01-24 (×2): 5000 [IU] via TOPICAL

## 2012-01-24 MED ORDER — BACITRACIN 50000 UNITS IM SOLR
INTRAMUSCULAR | Status: AC
  Filled 2012-01-24: qty 1

## 2012-01-24 MED ORDER — NITROFURANTOIN MACROCRYSTAL 50 MG PO CAPS
50.0000 mg | ORAL_CAPSULE | Freq: Every day | ORAL | Status: DC
  Administered 2012-01-25: 50 mg via ORAL
  Filled 2012-01-24: qty 1

## 2012-01-24 MED ORDER — INFLUENZA VIRUS VACC SPLIT PF IM SUSP
0.5000 mL | INTRAMUSCULAR | Status: AC
  Administered 2012-01-25: 0.5 mL via INTRAMUSCULAR
  Filled 2012-01-24: qty 0.5

## 2012-01-24 MED ORDER — PROPOFOL 10 MG/ML IV BOLUS
INTRAVENOUS | Status: DC | PRN
  Administered 2012-01-24: 200 mg via INTRAVENOUS

## 2012-01-24 MED ORDER — VANCOMYCIN HCL 1000 MG IV SOLR
750.0000 mg | Freq: Once | INTRAVENOUS | Status: AC
  Administered 2012-01-24: 750 mg via INTRAVENOUS
  Filled 2012-01-24: qty 750

## 2012-01-24 MED ORDER — PHENOL 1.4 % MT LIQD: 1.0000 | OROMUCOSAL | Status: DC | PRN

## 2012-01-24 MED ORDER — HYDROMORPHONE HCL PF 1 MG/ML IJ SOLN
0.5000 mg | INTRAMUSCULAR | Status: DC | PRN
  Administered 2012-01-24 – 2012-01-25 (×2): 1 mg via INTRAVENOUS
  Filled 2012-01-24 (×2): qty 1

## 2012-01-24 MED ORDER — SODIUM CHLORIDE 0.9 % IV SOLN: 250.0000 mL | INTRAVENOUS | Status: DC

## 2012-01-24 MED ORDER — PNEUMOCOCCAL VAC POLYVALENT 25 MCG/0.5ML IJ INJ
0.5000 mL | INJECTION | INTRAMUSCULAR | Status: AC
  Administered 2012-01-25: 0.5 mL via INTRAMUSCULAR
  Filled 2012-01-24: qty 0.5

## 2012-01-24 SURGICAL SUPPLY — 63 items
APL SKNCLS STERI-STRIP NONHPOA (GAUZE/BANDAGES/DRESSINGS) ×1
BAG DECANTER FOR FLEXI CONT (MISCELLANEOUS) ×2 IMPLANT
BENZOIN TINCTURE PRP APPL 2/3 (GAUZE/BANDAGES/DRESSINGS) ×2 IMPLANT
BIT DRILL SPINE QC 12 (BIT) ×1 IMPLANT
BLADE SURG 15 STRL LF DISP TIS (BLADE) ×1 IMPLANT
BLADE SURG 15 STRL SS (BLADE) ×2
BLADE ULTRA TIP 2M (BLADE) ×2 IMPLANT
BRUSH SCRUB EZ PLAIN DRY (MISCELLANEOUS) ×2 IMPLANT
BUR BARREL STRAIGHT FLUTE 4.0 (BURR) ×2 IMPLANT
BUR MATCHSTICK NEURO 3.0 LAGG (BURR) ×2 IMPLANT
CANISTER SUCTION 2500CC (MISCELLANEOUS) ×2 IMPLANT
CLOTH BEACON ORANGE TIMEOUT ST (SAFETY) ×2 IMPLANT
CONT SPEC 4OZ CLIKSEAL STRL BL (MISCELLANEOUS) ×2 IMPLANT
COVER MAYO STAND STRL (DRAPES) ×2 IMPLANT
DRAIN JACKSON PRATT 10MM FLAT (MISCELLANEOUS) ×1 IMPLANT
DRAPE LAPAROTOMY 100X72 PEDS (DRAPES) ×2 IMPLANT
DRAPE MICROSCOPE LEICA (MISCELLANEOUS) IMPLANT
DRAPE POUCH INSTRU U-SHP 10X18 (DRAPES) ×2 IMPLANT
DRAPE SURG 17X23 STRL (DRAPES) ×4 IMPLANT
ELECT BLADE 4.0 EZ CLEAN MEGAD (MISCELLANEOUS) ×2
ELECT REM PT RETURN 9FT ADLT (ELECTROSURGICAL) ×2
ELECTRODE BLDE 4.0 EZ CLN MEGD (MISCELLANEOUS) IMPLANT
ELECTRODE REM PT RTRN 9FT ADLT (ELECTROSURGICAL) ×1 IMPLANT
EVACUATOR SILICONE 100CC (DRAIN) ×1 IMPLANT
GAUZE SPONGE 4X4 16PLY XRAY LF (GAUZE/BANDAGES/DRESSINGS) ×1 IMPLANT
GLOVE BIO SURGEON STRL SZ8.5 (GLOVE) ×1 IMPLANT
GLOVE BIOGEL PI IND STRL 8 (GLOVE) IMPLANT
GLOVE BIOGEL PI IND STRL 8.5 (GLOVE) IMPLANT
GLOVE BIOGEL PI INDICATOR 8 (GLOVE) ×2
GLOVE BIOGEL PI INDICATOR 8.5 (GLOVE) ×3
GLOVE EXAM NITRILE LRG STRL (GLOVE) IMPLANT
GLOVE EXAM NITRILE MD LF STRL (GLOVE) ×1 IMPLANT
GLOVE EXAM NITRILE XL STR (GLOVE) IMPLANT
GLOVE EXAM NITRILE XS STR PU (GLOVE) IMPLANT
GLOVE SS BIOGEL STRL SZ 8 (GLOVE) ×1 IMPLANT
GLOVE SUPERSENSE BIOGEL SZ 8 (GLOVE)
GOWN BRE IMP SLV AUR LG STRL (GOWN DISPOSABLE) IMPLANT
GOWN BRE IMP SLV AUR XL STRL (GOWN DISPOSABLE) ×3 IMPLANT
KIT BASIN OR (CUSTOM PROCEDURE TRAY) ×2 IMPLANT
KIT ROOM TURNOVER OR (KITS) ×2 IMPLANT
MARKER SKIN DUAL TIP RULER LAB (MISCELLANEOUS) ×2 IMPLANT
NDL SPNL 18GX3.5 QUINCKE PK (NEEDLE) ×1 IMPLANT
NEEDLE HYPO 22GX1.5 SAFETY (NEEDLE) ×2 IMPLANT
NEEDLE SPNL 18GX3.5 QUINCKE PK (NEEDLE) ×2 IMPLANT
NS IRRIG 1000ML POUR BTL (IV SOLUTION) ×2 IMPLANT
PACK LAMINECTOMY NEURO (CUSTOM PROCEDURE TRAY) ×2 IMPLANT
PEEK VISTA 14X14X7MM (Peek) ×1 IMPLANT
PIN DISTRACTION 14MM (PIN) ×4 IMPLANT
PLATE ANT CERV XTEND 1 LV 14 (Plate) ×1 IMPLANT
PUTTY ABX ACTIFUSE 1.5ML (Putty) ×1 IMPLANT
RUBBERBAND STERILE (MISCELLANEOUS) ×2 IMPLANT
SCREW XTD VAR 4.2 SELF TAP 12 (Screw) ×4 IMPLANT
SPONGE GAUZE 4X4 12PLY (GAUZE/BANDAGES/DRESSINGS) ×2 IMPLANT
SPONGE INTESTINAL PEANUT (DISPOSABLE) ×4 IMPLANT
SPONGE SURGIFOAM ABS GEL SZ50 (HEMOSTASIS) ×2 IMPLANT
STRIP CLOSURE SKIN 1/2X4 (GAUZE/BANDAGES/DRESSINGS) ×2 IMPLANT
SUT VIC AB 0 CT1 27 (SUTURE) ×2
SUT VIC AB 0 CT1 27XBRD ANTBC (SUTURE) ×1 IMPLANT
SUT VIC AB 3-0 SH 8-18 (SUTURE) ×2 IMPLANT
SYR 20ML ECCENTRIC (SYRINGE) ×2 IMPLANT
TOWEL OR 17X24 6PK STRL BLUE (TOWEL DISPOSABLE) ×2 IMPLANT
TOWEL OR 17X26 10 PK STRL BLUE (TOWEL DISPOSABLE) ×2 IMPLANT
WATER STERILE IRR 1000ML POUR (IV SOLUTION) ×2 IMPLANT

## 2012-01-24 NOTE — Progress Notes (Signed)
ANTIBIOTIC CONSULT NOTE - INITIAL  Pharmacy Consult for Vancomycin Indication: Post op prophylaxsis  Allergies  Allergen Reactions  . Erythromycin Anaphylaxis  . Gadolinium Derivatives Hives and Rash    Pt broke out in hives all over body.  Has IV steroids and Benadryl in hospital for MRI.  Marland Kitchen Iodinated Diagnostic Agents Hives    13-hour prep  . Morphine Nausea And Vomiting  . Penicillins Anaphylaxis  . Prednisone Other (See Comments)    "shakes me out of my frame" IM steroids aren't as bad as PO  . Sulfonamide Derivatives Itching and Rash    All over body   . Aspirin Nausea Only and Other (See Comments)    Burning in stomach   . Latex Dermatitis and Other (See Comments)    blisters  . Other Itching and Other (See Comments)    Chlorhexin (CHG); "burning"  . Varenicline Tartrate Nausea And Vomiting         Patient Measurements: Height: 5\' 5"  (165.1 cm) Weight: 140 lb (63.504 kg) IBW/kg (Calculated) : 57   Vital Signs: Temp: 98.1 F (36.7 C) (11/21 1600) Temp src: Oral (11/21 0759) BP: 126/60 mmHg (11/21 1551) Pulse Rate: 80  (11/21 1551) Intake/Output from previous day:   Intake/Output from this shift: Total I/O In: 1700 [I.V.:1700] Out: 160 [Drains:10; Blood:150]  Labs: No results found for this basename: WBC:3,HGB:3,PLT:3,LABCREA:3,CREATININE:3 in the last 72 hours The CrCl is unknown because both a height and weight (above a minimum accepted value) are required for this calculation. No results found for this basename: VANCOTROUGH:2,VANCOPEAK:2,VANCORANDOM:2,GENTTROUGH:2,GENTPEAK:2,GENTRANDOM:2,TOBRATROUGH:2,TOBRAPEAK:2,TOBRARND:2,AMIKACINPEAK:2,AMIKACINTROU:2,AMIKACIN:2, in the last 72 hours   Microbiology: Recent Results (from the past 720 hour(s))  SURGICAL PCR SCREEN     Status: Normal   Collection Time   01/21/12  9:34 AM      Component Value Range Status Comment   MRSA, PCR NEGATIVE  NEGATIVE Final    Staphylococcus aureus NEGATIVE  NEGATIVE Final       Medical History: Past Medical History  Diagnosis Date  . Hypertension     takes Maxzide daily  . Hyperlipidemia     takes Simvastatin daily  . Smokers' cough     has albuterol prn  . Peripheral neuropathy   . Fibromyalgia   . Lupus   . Neck pain     herniated disc and radiculopathy  . Urinary frequency   . History of bladder infections     sees a urologist--on long term Macrodantin nightly  . History of blood transfusion     as a child  . Hypothyroidism     takes Synthroid daily  . Depression     takes Zoloft daily  . Insomnia     takes Ambien nightly  . Asthma   . COPD (chronic obstructive pulmonary disease)   . Pneumonia 2012    "have had it a couple times" (01/24/2012)  . Chronic bronchitis     "last time ~ 1 month ago" (01/24/2012)  . Type II diabetes mellitus     takes Janumet daily  . History of blood transfusion     "when I was a child" (01/24/2012)  . Arthritis     "little bit qwhere" (01/24/2012)  . Chronic back pain     "neck to tailbone" (01/24/2012)    Medications:  Prescriptions prior to admission  Medication Sig Dispense Refill  . albuterol (PROVENTIL HFA;VENTOLIN HFA) 108 (90 BASE) MCG/ACT inhaler Inhale 2 puffs into the lungs every 6 (six) hours as needed. For shortness of breath      .  gabapentin (NEURONTIN) 300 MG capsule Take 300 mg by mouth daily.      Marland Kitchen levothyroxine (SYNTHROID, LEVOTHROID) 112 MCG tablet Take 112 mcg by mouth daily.      . nitrofurantoin (MACRODANTIN) 50 MG capsule Take 50 mg by mouth daily.      Marland Kitchen oxyCODONE-acetaminophen (PERCOCET) 10-325 MG per tablet Take 1 tablet by mouth 2 (two) times daily as needed. For shortness of breath      . sertraline (ZOLOFT) 100 MG tablet Take 100 mg by mouth daily.      . simvastatin (ZOCOR) 20 MG tablet Take 20 mg by mouth every evening.      . sitaGLIPtan-metformin (JANUMET) 50-1000 MG per tablet Take 1 tablet by mouth daily.      Marland Kitchen triamterene-hydrochlorothiazide (MAXZIDE-25) 37.5-25  MG per tablet Take 1 tablet by mouth daily.      Marland Kitchen zolpidem (AMBIEN) 5 MG tablet Take 5 mg by mouth at bedtime as needed. For sleep       Assessment: 66 yo F admitted 01/24/2012 after cervical discectomy, fusion and plating. Pharmacy consulted to dose vancomycin x 1 dose.  Patient received 1g vancomycin at 1127, and does not have a drain.  Goal of Therapy:  Vancomycin trough level 10-15 mcg/ml  Plan:  1. Vancomycin 750 mg IV x 1 at 2330. 2. Pharmacy will sign off at this time, please call for further questions.  Thank you for allowing pharmacy to be a part of this patients care team.  Lovenia Kim Pharm.D., BCPS Clinical Pharmacist 01/24/2012 6:31 PM Pager: [phone] Phone: [phone]

## 2012-01-24 NOTE — Preoperative (Signed)
Beta Blockers   Reason not to administer Beta Blockers:Not Applicable 

## 2012-01-24 NOTE — H&P (Signed)
Subjective: The patient is a 66 year old white female who I performed a C4556 and C6-7 anterior cervical discectomy fusion and plating on years ago. The patient has done well but recently has developed severe neck and right shoulder pain. She has failed medical management and was worked up with a cervical MRI. This demonstrated spondylosis stenosis facet arthropathy etc. at C3-4. I discussed situation with the patient. We discussed the various treatment options including surgery. The patient has weighed the risks, benefits, and alternatives surgery and decided proceed with a C3-4 and 2 cervicectomy fusion plating with removal of her old anterior instrumentation and exploration of her fusion.   Past Medical History  Diagnosis Date  . Hypertension     takes Maxzide daily  . Hyperlipidemia     takes Simvastatin daily  . Smokers' cough     has albuterol prn  . History of bronchitis     last time about a yr and half ago  . Pneumonia     last time about a yr ago  . Peripheral neuropathy   . Fibromyalgia   . Lupus   . Low back pain     buldging disc in low back   . Neck pain     herniated disc and radiculopathy  . Urinary frequency   . History of bladder infections     sees a urologist--on long term Macrodantin nightly  . History of blood transfusion     as a child  . Hypothyroidism     takes Synthroid daily  . Diabetes mellitus without complication     takes Janumet daily  . Depression     takes Zoloft daily  . Insomnia     takes Ambien nightly    Past Surgical History  Procedure Date  . Appendectomy     in college  . Bladder tack 1974  . Abdominal hysterectomy 1974  . Bladder surgery 1977    through abdomen  . Left foot surgery 80's  . Rectocele repair 2000  . Back surgery 2001  . Cervical fusion 2002  . Lumbar fusion 2009  . Colonoscopy   . Esophagogastroduodenoscopy   . Cardiac catheterization 2008    Allergies  Allergen Reactions  . Erythromycin Anaphylaxis  .  Penicillins Anaphylaxis  . Gadolinium Derivatives Rash    Pt broke out in hives all over body.  Has IV steroids and Benadryl in hospital for MRI.  Marland Kitchen Iodinated Diagnostic Agents Hives    13-hour prep  . Latex Dermatitis    blisters  . Morphine Nausea And Vomiting  . Prednisone Other (See Comments)    "shakes me out of my frame" IM steroids aren't as bad as PO  . Sulfonamide Derivatives Itching and Rash    All over body   . Varenicline Tartrate Nausea And Vomiting       . Other Itching    Chlorhexin (CHG)  . Aspirin Nausea Only    Burning in stomach     History  Substance Use Topics  . Smoking status: Current Every Day Smoker -- 1.0 packs/day for 60 years  . Smokeless tobacco: Not on file  . Alcohol Use: No    History reviewed. No pertinent family history. Prior to Admission medications   Medication Sig Start Date End Date Taking? Authorizing Provider  albuterol (PROVENTIL HFA;VENTOLIN HFA) 108 (90 BASE) MCG/ACT inhaler Inhale 2 puffs into the lungs every 6 (six) hours as needed. For shortness of breath   Yes Historical Provider, MD  gabapentin (  NEURONTIN) 300 MG capsule Take 300 mg by mouth daily.   Yes Historical Provider, MD  levothyroxine (SYNTHROID, LEVOTHROID) 112 MCG tablet Take 112 mcg by mouth daily.   Yes Historical Provider, MD  nitrofurantoin (MACRODANTIN) 50 MG capsule Take 50 mg by mouth daily.   Yes Historical Provider, MD  oxyCODONE-acetaminophen (PERCOCET) 10-325 MG per tablet Take 1 tablet by mouth 2 (two) times daily as needed. For shortness of breath   Yes Historical Provider, MD  sertraline (ZOLOFT) 100 MG tablet Take 100 mg by mouth daily.   Yes Historical Provider, MD  simvastatin (ZOCOR) 20 MG tablet Take 20 mg by mouth every evening.   Yes Historical Provider, MD  sitaGLIPtan-metformin (JANUMET) 50-1000 MG per tablet Take 1 tablet by mouth daily.   Yes Historical Provider, MD  triamterene-hydrochlorothiazide (MAXZIDE-25) 37.5-25 MG per tablet Take 1  tablet by mouth daily.   Yes Historical Provider, MD  zolpidem (AMBIEN) 5 MG tablet Take 5 mg by mouth at bedtime as needed. For sleep   Yes Historical Provider, MD     Review of Systems  Positive ROS: As above  All other systems have been reviewed and were otherwise negative with the exception of those mentioned in the HPI and as above.  Objective: Vital signs in last 24 hours: Temp:  [97.8 F (36.6 C)] 97.8 F (36.6 C) (11/21 0759) Pulse Rate:  [68] 68  (11/21 0759) Resp:  [18] 18  (11/21 0759) BP: (125)/(68) 125/68 mmHg (11/21 0759) SpO2:  [95 %] 95 % (11/21 0759)  General Appearance: Alert, cooperative, no distress, appears stated age Head: Normocephalic, without obvious abnormality, atraumatic Eyes: PERRL, conjunctiva/corneas clear, EOM's intact, fundi benign, both eyes      Ears: Normal TM's and external ear canals, both ears Throat: Lips, mucosa, and tongue normal; teeth and gums normal Neck: Supple, symmetrical, trachea midline, no adenopathy; thyroid: No enlargement/tenderness/nodules; no carotid bruit or JVD. The patient's cervical incision is well-healed. Spurling's testing is positive on the right. Back: Symmetric, no curvature, ROM normal, no CVA tenderness Lungs: Clear to auscultation bilaterally, respirations unlabored Heart: Regular rate and rhythm, S1 and S2 normal, no murmur, rub or gallop Abdomen: Soft, non-tender, bowel sounds active all four quadrants, no masses, no organomegaly Extremities: Extremities normal, atraumatic, no cyanosis or edema Pulses: 2+ and symmetric all extremities Skin: Skin color, texture, turgor normal, no rashes or lesions  NEUROLOGIC:   Mental status: alert and oriented, no aphasia, good attention span, Fund of knowledge/ memory ok Motor Exam - grossly normal Sensory Exam - grossly normal Reflexes: Symmetric Coordination - grossly normal Gait - grossly normal Balance - grossly normal Cranial Nerves: I: smell Not tested  II:  visual acuity  OS: Normal    OD: Normal   II: visual fields Full to confrontation  II: pupils Equal, round, reactive to light  III,VII: ptosis None  III,IV,VI: extraocular muscles  Full ROM  V: mastication Normal  V: facial light touch sensation  Normal  V,VII: corneal reflex  Present  VII: facial muscle function - upper  Normal  VII: facial muscle function - lower Normal  VIII: hearing Not tested  IX: soft palate elevation  Normal  IX,X: gag reflex Present  XI: trapezius strength  5/5  XI: sternocleidomastoid strength 5/5  XI: neck flexion strength  5/5  XII: tongue strength  Normal    Data Review Lab Results  Component Value Date   WBC 14.1* 01/21/2012   HGB 14.4 01/21/2012   HCT 40.8 01/21/2012  MCV 89.9 01/21/2012   PLT 257 01/21/2012   Lab Results  Component Value Date   NA 135 01/21/2012   K 3.4* 01/21/2012   CL 95* 01/21/2012   CO2 27 01/21/2012   BUN 13 01/21/2012   CREATININE 0.72 01/21/2012   GLUCOSE 175* 01/21/2012   Lab Results  Component Value Date   INR 1.0 09/07/2006    Assessment/Plan: 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 I have reviewed her MR scan with her and pointed out the abnormalities. We have discussed the various treatment options including surgery. I described the surgical option of exploration of her cervical fusion with removal of the old cervical hardware and a C3-4 anterior cervical discectomy, fusion and plating. I have described the surgery to her. I've shown her surgical models. We have discussed the risks, benefits, alternatives, and likelihood of achieving our goals with surgery. I have answered all the patient's questions. The patient has decided proceed with surgery.   Loys Shugars D 01/24/2012 9:26 AM

## 2012-01-24 NOTE — Transfer of Care (Signed)
Immediate Anesthesia Transfer of Care Note  Patient: [name]  Procedure(s) Performed: Procedure(s) (LRB) with comments: ANTERIOR CERVICAL DECOMPRESSION/DISCECTOMY FUSION 1 LEVEL (N/A) - CERVICAL THREE-FOUR anterior cervical decompression with fusion interbody prothesis plating and bonegraft  Patient Location: PACU  Anesthesia Type:General  Level of Consciousness: awake, alert  and oriented  Airway & Oxygen Therapy: Patient Spontanous Breathing and Patient connected to nasal cannula oxygen  Post-op Assessment: Report given to PACU RN  Post vital signs: Reviewed and stable  Complications: No apparent anesthesia complications

## 2012-01-24 NOTE — Op Note (Signed)
Brief history: The patient is a 66 year old white female who I performed a C4-5, C5-6 and C6-7 anterior cervical discectomy, fusion and plating on many years ago. The patient has developed recurrent neck and shoulder pain consistent with a cervical radiculopathy. She has failed medical management and was worked up with cervical MRI. This demonstrated this degeneration, spondylosis, foraminal stenosis, facet arthropathy at C3-4. I discussed the various treatment option with the patient including surgery. The patient has weighed the risks, benefits, and alternatives surgery and decided proceed with a x-ray for a cervical fusion with removal roll placed and a C3-4 intracervical discectomy, fusion, and plating.  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 Delma Officer  Asst.: Dr. Maeola Harman  Anesthesia: Gen. endotracheal  Estimated blood loss: 125 cc  Drains: None  Complications: None  Description of procedure: The patient was brought to the operating room by the anesthesia team. General endotracheal anesthesia was induced. A roll was placed under the patient's shoulders to keep the neck in the neutral position. The patient's anterior cervical region was then prepared with Betadine scrub and Betadine solution. Sterile drapes were applied.  The area to be incised was then injected with Marcaine with epinephrine solution. I then used a scalpel to make a transverse incision in the patient's left anterior  neck. I used the Metzenbaum scissors to divide the platysmal muscle and then to dissect medial to the sternocleidomastoid muscle, jugular vein, and carotid artery. I carefully dissected down towards the anterior cervical spine identifying the esophagus and retracting it medially. Then using Kitner swabs to clear soft tissue from the anterior cervical spine exposing the old plate from R6-E4. I used a scalpel to incise some overlying scar tissue. I then used a cam tightness to loosen the cams and then I removed the screws. We then removed the anterior cervical plating from C4-C7. I then explored the arthrodesis at C4-5, C5-6 and 67 and it appeared solid.   I then used electrocautery to detach the medial border of the longus colli muscle bilaterally from the C3-4 intervertebral disc spaces. I then inserted the Caspar self-retaining retractor underneath the longus colli muscle bilaterally to provide exposure.  We then incised the intervertebral disc at C3-4, it was quite spondylotic. We then performed a partial intervertebral discectomy with a pituitary forceps and the Karlin curettes. I then inserted distraction screws into the vertebral bodies at C3 and C4. We then distracted the interspace. We then used the high-speed drill to decorticate the vertebral endplates at C3-4, to drill away the remainder of the intervertebral disc, to drill away some posterior spondylosis, and to thin out the posterior longitudinal ligament. I then incised ligament with the arachnoid knife. We then removed the ligament with a Kerrison punches undercutting the vertebral endplates and decompressing the thecal sac. We then performed foraminotomies about the bilateral C4 nerve roots. This completed the decompression at this level.   We now turned our to attention to the interbody fusion. We used the trial spacers to determine the appropriate size for the interbody prosthesis. We then pre-filled prosthesis with a combination of local  morcellized autograft bone that we obtained  during decompression as well as Actifuse bone graft extender. We then inserted the prosthesis into the distracted interspace at C3-4. We then removed the distraction screws. There was a good snug fit of the prosthesis in the interspace.   Having completed the fusion we now turned attention to the anterior spinal instrumentation. We used the high-speed drill to drill away some anterior spondylosis at the disc spaces so that the plate lay down flat. We selected the appropriate length titanium anterior cervical plate. We laid it along the anterior aspect of the vertebral bodies from C3-4. We then drilled 12 mm holes at C3, we used the old screw holes at C4. We then secured the plate to the vertebral bodies by placing two 12 mm self-tapping screws at C3 and C4. We then obtained intraoperative radiograph. The demonstrating good position of the instrumentation. We therefore secured the screws the plate the locking each cam. This completed the instrumentation.  We then obtained hemostasis using bipolar electrocautery. We irrigated the wound out with bacitracin solution. We then removed the retractor. We inspected the esophagus for any damage. There was none apparent. I then inserted a 10 mm flat Jackson-Pratt drain in the prevertebral space. I tunneled out through separate stab wound. We then reapproximated patient's platysmal muscle with interrupted 3-0 Vicryl suture. We then reapproximated the subcutaneous tissue with interrupted 3-0 Vicryl suture. The skin was reapproximated with Steri-Strips and benzoin. The wound was then covered with bacitracin ointment. A sterile dressing was applied. The drapes were removed. Patient was subsequently extubated by the anesthesia team and transported to the post anesthesia care unit in stable condition. All sponge instrument and needle counts were reportedly correct at the end of this case.

## 2012-01-24 NOTE — Progress Notes (Signed)
Orthopedic Tech Progress Note Patient Details:  [name] 1945-11-21 [id number]  Patient ID: [name], female   DOB: Mar 13, 1945, 66 y.o.   MRN: [id number]   [name] 01/24/2012, 9:26 AM ASPEN COLLAR WITH LINERS COMPLETED BY [employer]

## 2012-01-24 NOTE — Progress Notes (Signed)
No ECHO sent from office and not in EPIC.

## 2012-01-24 NOTE — Anesthesia Preprocedure Evaluation (Signed)
Anesthesia Evaluation  Patient identified by MRN, date of birth, ID band Patient awake    Reviewed: Allergy & Precautions, H&P , NPO status , Patient's Chart, lab work & pertinent test results  Airway Mallampati: I TM Distance: >3 FB Neck ROM: full    Dental   Pulmonary COPDCurrent Smoker,          Cardiovascular hypertension, Rhythm:regular Rate:Normal     Neuro/Psych  Neuromuscular disease    GI/Hepatic   Endo/Other  diabetes, Type 2, Oral Hypoglycemic AgentsHypothyroidism   Renal/GU      Musculoskeletal  (+) Fibromyalgia -  Abdominal   Peds  Hematology   Anesthesia Other Findings   Reproductive/Obstetrics                           Anesthesia Physical Anesthesia Plan  ASA: III  Anesthesia Plan: General   Post-op Pain Management:    Induction: Intravenous  Airway Management Planned: Oral ETT  Additional Equipment:   Intra-op Plan:   Post-operative Plan:   Informed Consent: I have reviewed the patients History and Physical, chart, labs and discussed the procedure including the risks, benefits and alternatives for the proposed anesthesia with the patient or authorized representative who has indicated his/her understanding and acceptance.     Plan Discussed with: CRNA, Anesthesiologist and Surgeon  Anesthesia Plan Comments:         Anesthesia Quick Evaluation

## 2012-01-24 NOTE — Anesthesia Postprocedure Evaluation (Signed)
  Anesthesia Post-op Note  Patient: [name]  Procedure(s) Performed: Procedure(s) (LRB) with comments: ANTERIOR CERVICAL DECOMPRESSION/DISCECTOMY FUSION 1 LEVEL (N/A) - CERVICAL THREE-FOUR anterior cervical decompression with fusion interbody prothesis plating and bonegraft  Patient Location: PACU  Anesthesia Type:General  Level of Consciousness: awake, oriented, sedated and patient cooperative  Airway and Oxygen Therapy: Patient Spontanous Breathing  Post-op Pain: mild  Post-op Assessment: Post-op Vital signs reviewed, Patient's Cardiovascular Status Stable, Respiratory Function Stable, Patent Airway, No signs of Nausea or vomiting and Pain level controlled  Post-op Vital Signs: stable  Complications: No apparent anesthesia complications

## 2012-01-24 NOTE — Progress Notes (Signed)
UR COMPLETED  

## 2012-01-24 NOTE — Progress Notes (Signed)
Subjective:  The patient is somnolent but easily arousable.  Objective: Vital signs in last 24 hours: Temp:  [97.8 F (36.6 C)-99 F (37.2 C)] 98.1 F (36.7 C) (11/21 1600) Pulse Rate:  [68-114] 80  (11/21 1551) Resp:  [18-33] 21  (11/21 1551) BP: (125-168)/(54-86) 126/60 mmHg (11/21 1551) SpO2:  [87 %-96 %] 94 % (11/21 1551)  Intake/Output from previous day:   Intake/Output this shift: Total I/O In: 1700 [I.V.:1700] Out: 160 [Drains:10; Blood:150]  Physical exam the patient is moving all 4 extremities. Her dressing is clean and dry without evidence of hematoma or shift.  Lab Results: No results found for this basename: WBC:2,HGB:2,HCT:2,PLT:2 in the last 72 hours BMET No results found for this basename: NA:2,K:2,CL:2,CO2:2,GLUCOSE:2,BUN:2,CREATININE:2,CALCIUM:2 in the last 72 hours  Studies/Results: Dg Cervical Spine 1 View  01/24/2012  *RADIOLOGY REPORT*  Clinical Data: 66 year old female undergoing cervical spine surgery.  DG CERVICAL SPINE - 1 VIEW  Comparison: Cervical MRI 01/02/2012. [facility] cervical radiographs 01/08/2012.  Findings: Portable cross-table lateral intraoperative view the cervical spine at 1340 hours.  C3-C4 ACDF hardware now in place. The previously seen C4 to lower cervical fusion hardware is been removed.  IMPRESSION: Cervical spine fusion extended to C3-C4 with lower cervical hardware removal.   Original Report Authenticated By: Erskine Speed, M.D.     Assessment/Plan: The patient is doing well.  LOS: 0 days     Nour Scalise D 01/24/2012, 4:17 PM

## 2012-01-24 NOTE — Anesthesia Procedure Notes (Signed)
Procedure Name: Intubation Date/Time: 01/24/2012 11:36 AM Performed by: Angelica Pou Pre-anesthesia Checklist: Patient identified, Timeout performed, Emergency Drugs available, Suction available and Patient being monitored Patient Re-evaluated:Patient Re-evaluated prior to inductionOxygen Delivery Method: Circle system utilized Preoxygenation: Pre-oxygenation with 100% oxygen Intubation Type: IV induction Ventilation: Mask ventilation without difficulty Laryngoscope Size: Mac and 3 Grade View: Grade I Tube type: Oral Tube size: 7.5 mm Number of attempts: 1 Airway Equipment and Method: Stylet Placement Confirmation: ETT inserted through vocal cords under direct vision,  breath sounds checked- equal and bilateral and positive ETCO2 Secured at: 23 cm Tube secured with: Tape Dental Injury: Teeth and Oropharynx as per pre-operative assessment

## 2012-01-25 ENCOUNTER — Encounter (HOSPITAL_COMMUNITY): Payer: Self-pay | Admitting: Neurosurgery

## 2012-01-25 MED ORDER — DSS 100 MG PO CAPS: 100.0000 mg | ORAL_CAPSULE | Freq: Two times a day (BID) | ORAL | Status: DC

## 2012-01-25 MED ORDER — OXYCODONE-ACETAMINOPHEN 10-325 MG PO TABS: 1.0000 | ORAL_TABLET | ORAL | Status: DC | PRN

## 2012-01-25 MED ORDER — DIAZEPAM 5 MG PO TABS: 5.0000 mg | ORAL_TABLET | Freq: Four times a day (QID) | ORAL | Status: DC | PRN

## 2012-01-25 NOTE — Care Management Note (Signed)
    Page 1 of 1   01/25/2012     12:38:31 PM   CARE MANAGEMENT NOTE 01/25/2012  Patient:  [name], [name]   Account Number:  [id number]  Date Initiated:  01/25/2012  Documentation initiated by:  [facility]  Subjective/Objective Assessment:   Admitted postop removal of old plate, W0-9 anterior cervicectomy, fusion, plating.     Action/Plan:   return home with husband   Anticipated DC Date:  01/25/2012   Anticipated DC Plan:  HOME/SELF CARE      DC Planning Services  CM consult      Choice offered to / List presented to:             Status of service:  Completed, signed off Medicare Important Message given?   (If response is "NO", the following Medicare IM given date fields will be blank) Date Medicare IM given:   Date Additional Medicare IM given:    Discharge Disposition:  HOME/SELF CARE  Per UR Regulation:  Reviewed for med. necessity/level of care/duration of stay  If discussed at Long Length of Stay Meetings, dates discussed:    Comments:

## 2012-01-25 NOTE — Progress Notes (Signed)
Orthopedic Tech Progress Note Patient Details:  [name] Oct 30, 1945 [id number]  Patient ID: [name], female   DOB: 1945/08/31, 66 y.o.   MRN: [id number]   [name] 01/25/2012, 8:13 AM ASPEN COLLAR COMPLETED BY [employer].

## 2012-01-25 NOTE — Discharge Summary (Signed)
Physician Discharge Summary  Patient ID: [name] MRN: [id number] DOB/AGE: 1945-07-05 66 y.o.  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 On that day I performed an exploration of her cervical fusion with removal of old plate, as well as a C3-4 anterior cervicectomy, fusion and plating. The surgery went well.  The patient's postoperative course was unremarkable and she was discharged to home at her request on postop day #1. The patient was given oral and written discharge instructions. All her questions were answered.  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 Discharge Exam: Blood pressure 111/59, pulse 68, temperature 97.7 F (36.5 C), temperature source Oral, resp. rate 20, height 5\' 5"  (1.651 m), weight 63.504 kg (140 lb), SpO2 90.00%. Patient is alert and pleasant. She looks well. Her dressing is clean and dry without evidence of hematoma or shift. Her strength is normal in all 4 extremities.  Disposition: Home  Discharge Orders    Future Orders Please Complete By Expires   Diet - low sodium heart healthy      Increase activity slowly      Discharge instructions      Comments:   Call [phone] for a followup appointment.   Remove dressing in 48 hours      Call MD for:  temperature >100.4      Call MD for:  persistant nausea and vomiting      Call MD for:  severe uncontrolled pain      Call MD for:  redness, tenderness, or signs of infection (pain, swelling, redness, odor or green/yellow discharge around  incision site)      Call MD for:  difficulty breathing, headache or visual disturbances      Call MD for:  hives      Call MD for:  persistant dizziness or light-headedness      Call MD for:  extreme fatigue          Medication List     As of 01/25/2012  7:40 AM    TAKE these medications         albuterol 108 (90 BASE) MCG/ACT inhaler   Commonly known as: PROVENTIL HFA;VENTOLIN HFA   Inhale 2 puffs into the lungs every 6 (six) hours as needed. For shortness of breath      diazepam 5 MG tablet   Commonly known as: VALIUM   Take 1 tablet (5 mg total) by mouth every 6 (six) hours as needed.      DSS 100 MG Caps   Take 100 mg by mouth 2 (two) times daily.      gabapentin 300 MG capsule   Commonly known as: NEURONTIN   Take 300 mg by mouth daily.      levothyroxine 112 MCG tablet   Commonly known as: SYNTHROID, LEVOTHROID   Take 112 mcg by mouth daily.      nitrofurantoin 50 MG capsule   Commonly known as: MACRODANTIN   Take 50 mg by mouth daily.      oxyCODONE-acetaminophen 10-325 MG per tablet   Commonly known as: PERCOCET   Take 1 tablet by mouth 2 (two) times daily as needed. For shortness of breath  oxyCODONE-acetaminophen 10-325 MG per tablet   Commonly known as: PERCOCET   Take 1 tablet by mouth every 4 (four) hours as needed for pain.      sertraline 100 MG tablet   Commonly known as: ZOLOFT   Take 100 mg by mouth daily.      simvastatin 20 MG tablet   Commonly known as: ZOCOR   Take 20 mg by mouth every evening.      sitaGLIPtan-metformin 50-1000 MG per tablet   Commonly known as: JANUMET   Take 1 tablet by mouth daily.      triamterene-hydrochlorothiazide 37.5-25 MG per tablet   Commonly known as: MAXZIDE-25   Take 1 tablet by mouth daily.      zolpidem 5 MG tablet   Commonly known as: AMBIEN   Take 5 mg by mouth at bedtime as needed. For sleep         Signed: Cristi Loron 01/25/2012, 7:40 AM

## 2012-01-25 NOTE — Progress Notes (Signed)
Chaplain responded to spiritual consult that pt would appreciate prayer. I visited with pt and her husband, offering encouragement and spiritual support. Pt is very pleased to be going home today. I prayed with pt and her husband.

## 2012-07-07 ENCOUNTER — Other Ambulatory Visit (HOSPITAL_COMMUNITY): Payer: Self-pay | Admitting: Neurosurgery

## 2012-07-07 DIAGNOSIS — M549 Dorsalgia, unspecified: Secondary | ICD-10-CM

## 2012-07-09 ENCOUNTER — Ambulatory Visit (HOSPITAL_COMMUNITY)
Admission: RE | Admit: 2012-07-09 | Discharge: 2012-07-09 | Disposition: A | Discharge status: Home / Self Care | Payer: Medicare Other | Source: Ambulatory Visit | Attending: Neurosurgery | Admitting: Neurosurgery

## 2012-07-09 DIAGNOSIS — M5126 Other intervertebral disc displacement, lumbar region: Secondary | ICD-10-CM | POA: Insufficient documentation

## 2012-07-09 DIAGNOSIS — M549 Dorsalgia, unspecified: Secondary | ICD-10-CM

## 2012-07-09 DIAGNOSIS — M47812 Spondylosis without myelopathy or radiculopathy, cervical region: Secondary | ICD-10-CM | POA: Insufficient documentation

## 2012-07-09 LAB — CREATININE, SERUM
Creatinine, Ser: 0.72 mg/dL (ref 0.50–1.10)
GFR calc Af Amer: >90 mL/min (ref >90)
GFR calc non Af Amer: 87 mL/min — ABNORMAL LOW (ref >90)

## 2012-07-09 MED ORDER — DEXAMETHASONE SODIUM PHOSPHATE 10 MG/ML IJ SOLN
INTRAMUSCULAR | Status: AC
  Filled 2012-07-09: qty 1

## 2012-07-09 MED ORDER — GADOBENATE DIMEGLUMINE 529 MG/ML IV SOLN
15.0000 mL | Freq: Once | INTRAVENOUS | Status: AC
  Administered 2012-07-09: 14 mL via INTRAVENOUS

## 2012-07-09 MED ORDER — DIPHENHYDRAMINE HCL 50 MG/ML IJ SOLN
INTRAMUSCULAR | Status: AC
  Filled 2012-07-09: qty 1

## 2012-07-09 MED ORDER — DEXAMETHASONE SODIUM PHOSPHATE 10 MG/ML IJ SOLN
10.0000 mg | Freq: Once | INTRAMUSCULAR | Status: AC
  Administered 2012-07-09: 10 mg via INTRAVENOUS
  Filled 2012-07-09: qty 1

## 2012-07-09 MED ORDER — DIPHENHYDRAMINE HCL 50 MG/ML IJ SOLN
50.0000 mg | Freq: Once | INTRAMUSCULAR | Status: AC
  Administered 2012-07-09: 50 mg via INTRAVENOUS
  Filled 2012-07-09: qty 1

## 2012-08-04 ENCOUNTER — Other Ambulatory Visit: Payer: Self-pay | Admitting: Urology

## 2012-08-04 DIAGNOSIS — N281 Cyst of kidney, acquired: Secondary | ICD-10-CM | POA: Insufficient documentation

## 2012-08-05 ENCOUNTER — Ambulatory Visit
Admission: RE | Admit: 2012-08-05 | Discharge: 2012-08-05 | Disposition: A | Discharge status: Home / Self Care | Payer: Medicare Other | Source: Ambulatory Visit | Attending: Urology | Admitting: Urology

## 2012-08-05 DIAGNOSIS — N281 Cyst of kidney, acquired: Secondary | ICD-10-CM

## 2012-09-11 ENCOUNTER — Other Ambulatory Visit: Payer: Self-pay | Admitting: Neurosurgery

## 2012-09-11 DIAGNOSIS — M5410 Radiculopathy, site unspecified: Secondary | ICD-10-CM

## 2012-09-18 ENCOUNTER — Ambulatory Visit
Admission: RE | Admit: 2012-09-18 | Discharge: 2012-09-18 | Disposition: A | Discharge status: Home / Self Care | Payer: Medicare Other | Source: Ambulatory Visit | Attending: Neurosurgery | Admitting: Neurosurgery

## 2012-09-18 VITALS — BP 132/95 | HR 79 | Resp 18

## 2012-09-18 DIAGNOSIS — M5410 Radiculopathy, site unspecified: Secondary | ICD-10-CM

## 2012-09-18 DIAGNOSIS — M4722 Other spondylosis with radiculopathy, cervical region: Secondary | ICD-10-CM

## 2012-09-18 MED ORDER — DIPHENHYDRAMINE HCL 50 MG/ML IJ SOLN
50.0000 mg | Freq: Once | INTRAMUSCULAR | Status: AC
  Administered 2012-09-18: 50 mg via INTRAMUSCULAR

## 2012-09-18 MED ORDER — DIAZEPAM 5 MG PO TABS
5.0000 mg | ORAL_TABLET | Freq: Once | ORAL | Status: AC
Start: 2012-09-18 — End: 2012-09-18
  Administered 2012-09-18: 5 mg via ORAL

## 2012-09-18 MED ORDER — IOHEXOL 180 MG/ML  SOLN
15.0000 mL | Freq: Once | INTRAMUSCULAR | Status: AC | PRN
  Administered 2012-09-18: 15 mL via INTRATHECAL

## 2012-09-18 NOTE — Progress Notes (Signed)
1150 pt less short of breath and feeling much better. To diagnostic for her upright films and then to CT.

## 2012-09-18 NOTE — Progress Notes (Addendum)
Resting quietly after CT for myelogram.  Denies SOB.  O2 DC'd.  Husband at bedside.  Donell Sievert, RN

## 2012-09-18 NOTE — Progress Notes (Signed)
Pt c/o shortness of breath, benadryl IM given and O2 applied via nasal cannula.  CBG= 252.  Patient given her Albuterol inhaler; 2 puffs.  jkl

## 2012-09-18 NOTE — Progress Notes (Addendum)
Patient states she has been off Zoloft for the past two days.  She states she has taken her 13-hour prep as prescribed for contrast allergy.

## 2012-10-14 ENCOUNTER — Other Ambulatory Visit: Payer: Self-pay | Admitting: Neurosurgery

## 2012-10-14 DIAGNOSIS — M25551 Pain in right hip: Secondary | ICD-10-CM

## 2012-10-18 ENCOUNTER — Ambulatory Visit
Admission: RE | Admit: 2012-10-18 | Discharge: 2012-10-18 | Disposition: A | Discharge status: Home / Self Care | Payer: Medicare Other | Source: Ambulatory Visit | Attending: Neurosurgery | Admitting: Neurosurgery

## 2012-10-18 DIAGNOSIS — M25551 Pain in right hip: Secondary | ICD-10-CM

## 2012-12-11 ENCOUNTER — Other Ambulatory Visit (HOSPITAL_COMMUNITY): Payer: Self-pay | Admitting: Internal Medicine

## 2012-12-11 ENCOUNTER — Other Ambulatory Visit: Payer: Self-pay | Admitting: Internal Medicine

## 2012-12-11 DIAGNOSIS — R1011 Right upper quadrant pain: Secondary | ICD-10-CM

## 2012-12-17 ENCOUNTER — Ambulatory Visit (HOSPITAL_COMMUNITY)
Admission: RE | Admit: 2012-12-17 | Discharge: 2012-12-17 | Disposition: A | Discharge status: Home / Self Care | Payer: Medicare Other | Source: Ambulatory Visit | Attending: Internal Medicine | Admitting: Internal Medicine

## 2012-12-17 ENCOUNTER — Encounter (HOSPITAL_COMMUNITY): Payer: Self-pay

## 2012-12-17 DIAGNOSIS — E279 Disorder of adrenal gland, unspecified: Secondary | ICD-10-CM | POA: Insufficient documentation

## 2012-12-17 DIAGNOSIS — R160 Hepatomegaly, not elsewhere classified: Secondary | ICD-10-CM | POA: Insufficient documentation

## 2012-12-17 DIAGNOSIS — N289 Disorder of kidney and ureter, unspecified: Secondary | ICD-10-CM | POA: Insufficient documentation

## 2012-12-17 DIAGNOSIS — R1011 Right upper quadrant pain: Secondary | ICD-10-CM | POA: Insufficient documentation

## 2013-01-01 ENCOUNTER — Other Ambulatory Visit: Payer: Self-pay | Admitting: Gastroenterology

## 2013-01-01 DIAGNOSIS — R1011 Right upper quadrant pain: Secondary | ICD-10-CM

## 2013-01-06 ENCOUNTER — Ambulatory Visit
Admission: RE | Admit: 2013-01-06 | Discharge: 2013-01-06 | Disposition: A | Discharge status: Home / Self Care | Payer: Medicare Other | Source: Ambulatory Visit | Attending: Gastroenterology | Admitting: Gastroenterology

## 2013-01-06 DIAGNOSIS — R1011 Right upper quadrant pain: Secondary | ICD-10-CM

## 2013-01-08 ENCOUNTER — Other Ambulatory Visit: Payer: Self-pay

## 2013-02-12 ENCOUNTER — Other Ambulatory Visit (HOSPITAL_COMMUNITY): Payer: Self-pay | Admitting: Gastroenterology

## 2013-02-12 DIAGNOSIS — R198 Other specified symptoms and signs involving the digestive system and abdomen: Secondary | ICD-10-CM

## 2013-02-12 DIAGNOSIS — R140 Abdominal distension (gaseous): Secondary | ICD-10-CM

## 2013-03-04 ENCOUNTER — Encounter (HOSPITAL_COMMUNITY)
Admission: RE | Admit: 2013-03-04 | Discharge: 2013-03-04 | Disposition: A | Discharge status: Home / Self Care | Payer: Medicare Other | Source: Ambulatory Visit | Attending: Gastroenterology | Admitting: Gastroenterology

## 2013-03-04 DIAGNOSIS — E119 Type 2 diabetes mellitus without complications: Secondary | ICD-10-CM | POA: Insufficient documentation

## 2013-03-04 DIAGNOSIS — R141 Gas pain: Secondary | ICD-10-CM | POA: Insufficient documentation

## 2013-03-04 DIAGNOSIS — R110 Nausea: Secondary | ICD-10-CM | POA: Insufficient documentation

## 2013-03-04 DIAGNOSIS — R142 Eructation: Secondary | ICD-10-CM | POA: Insufficient documentation

## 2013-03-06 ENCOUNTER — Encounter (HOSPITAL_COMMUNITY)
Admission: RE | Admit: 2013-03-06 | Discharge: 2013-03-06 | Disposition: A | Discharge status: Home / Self Care | Payer: Medicare Other | Source: Ambulatory Visit | Attending: Gastroenterology | Admitting: Gastroenterology

## 2013-03-06 DIAGNOSIS — R198 Other specified symptoms and signs involving the digestive system and abdomen: Secondary | ICD-10-CM

## 2013-03-06 DIAGNOSIS — R140 Abdominal distension (gaseous): Secondary | ICD-10-CM

## 2013-03-06 MED ORDER — TECHNETIUM TC 99M SULFUR COLLOID
2.0000 | Freq: Once | INTRAVENOUS | Status: AC | PRN
  Administered 2013-03-06: 2 via ORAL

## 2013-03-18 ENCOUNTER — Ambulatory Visit (HOSPITAL_COMMUNITY): Payer: Medicare Other

## 2013-03-26 ENCOUNTER — Other Ambulatory Visit: Payer: Self-pay

## 2013-03-26 DIAGNOSIS — Z1231 Encounter for screening mammogram for malignant neoplasm of breast: Secondary | ICD-10-CM

## 2013-04-22 ENCOUNTER — Ambulatory Visit: Payer: Medicare Other

## 2013-05-06 ENCOUNTER — Ambulatory Visit
Admission: RE | Admit: 2013-05-06 | Discharge: 2013-05-06 | Disposition: A | Discharge status: Home / Self Care | Payer: Medicare Other | Source: Ambulatory Visit

## 2013-05-06 DIAGNOSIS — Z1231 Encounter for screening mammogram for malignant neoplasm of breast: Secondary | ICD-10-CM

## 2013-06-19 ENCOUNTER — Other Ambulatory Visit: Payer: Self-pay | Admitting: Internal Medicine

## 2013-06-19 ENCOUNTER — Emergency Department (HOSPITAL_COMMUNITY): Payer: Medicare Other

## 2013-06-19 ENCOUNTER — Encounter (HOSPITAL_COMMUNITY): Payer: Self-pay | Admitting: Emergency Medicine

## 2013-06-19 ENCOUNTER — Ambulatory Visit
Admission: RE | Admit: 2013-06-19 | Discharge: 2013-06-19 | Disposition: A | Discharge status: Home / Self Care | Payer: Medicare Other | Source: Ambulatory Visit | Attending: Internal Medicine | Admitting: Internal Medicine

## 2013-06-19 ENCOUNTER — Emergency Department (HOSPITAL_COMMUNITY)
Admission: EM | Admit: 2013-06-19 | Discharge: 2013-06-19 | Disposition: A | Discharge status: Home / Self Care | Payer: Medicare Other | Attending: Emergency Medicine | Admitting: Emergency Medicine

## 2013-06-19 DIAGNOSIS — E785 Hyperlipidemia, unspecified: Secondary | ICD-10-CM | POA: Insufficient documentation

## 2013-06-19 DIAGNOSIS — Z91040 Latex allergy status: Secondary | ICD-10-CM | POA: Insufficient documentation

## 2013-06-19 DIAGNOSIS — Z87448 Personal history of other diseases of urinary system: Secondary | ICD-10-CM | POA: Insufficient documentation

## 2013-06-19 DIAGNOSIS — G4700 Insomnia, unspecified: Secondary | ICD-10-CM | POA: Insufficient documentation

## 2013-06-19 DIAGNOSIS — G8929 Other chronic pain: Secondary | ICD-10-CM | POA: Insufficient documentation

## 2013-06-19 DIAGNOSIS — F17200 Nicotine dependence, unspecified, uncomplicated: Secondary | ICD-10-CM | POA: Insufficient documentation

## 2013-06-19 DIAGNOSIS — Z79899 Other long term (current) drug therapy: Secondary | ICD-10-CM | POA: Insufficient documentation

## 2013-06-19 DIAGNOSIS — M129 Arthropathy, unspecified: Secondary | ICD-10-CM | POA: Insufficient documentation

## 2013-06-19 DIAGNOSIS — F3289 Other specified depressive episodes: Secondary | ICD-10-CM | POA: Insufficient documentation

## 2013-06-19 DIAGNOSIS — E039 Hypothyroidism, unspecified: Secondary | ICD-10-CM | POA: Insufficient documentation

## 2013-06-19 DIAGNOSIS — J441 Chronic obstructive pulmonary disease with (acute) exacerbation: Secondary | ICD-10-CM | POA: Insufficient documentation

## 2013-06-19 DIAGNOSIS — Z880 Allergy status to penicillin: Secondary | ICD-10-CM | POA: Insufficient documentation

## 2013-06-19 DIAGNOSIS — I10 Essential (primary) hypertension: Secondary | ICD-10-CM | POA: Insufficient documentation

## 2013-06-19 DIAGNOSIS — J40 Bronchitis, not specified as acute or chronic: Secondary | ICD-10-CM

## 2013-06-19 DIAGNOSIS — J438 Other emphysema: Secondary | ICD-10-CM

## 2013-06-19 DIAGNOSIS — R0602 Shortness of breath: Secondary | ICD-10-CM

## 2013-06-19 DIAGNOSIS — F329 Major depressive disorder, single episode, unspecified: Secondary | ICD-10-CM | POA: Insufficient documentation

## 2013-06-19 DIAGNOSIS — Z8669 Personal history of other diseases of the nervous system and sense organs: Secondary | ICD-10-CM | POA: Insufficient documentation

## 2013-06-19 DIAGNOSIS — Z8701 Personal history of pneumonia (recurrent): Secondary | ICD-10-CM | POA: Insufficient documentation

## 2013-06-19 DIAGNOSIS — J45901 Unspecified asthma with (acute) exacerbation: Principal | ICD-10-CM

## 2013-06-19 DIAGNOSIS — E119 Type 2 diabetes mellitus without complications: Secondary | ICD-10-CM | POA: Insufficient documentation

## 2013-06-19 LAB — BASIC METABOLIC PANEL
BUN: 12 mg/dL (ref 6–23)
CO2: 24 mEq/L (ref 19–32)
Calcium: 9.1 mg/dL (ref 8.4–10.5)
Chloride: 95 mEq/L — ABNORMAL LOW (ref 96–112)
Creatinine, Ser: 0.74 mg/dL (ref 0.50–1.10)
GFR calc Af Amer: >90 mL/min (ref >90)
GFR calc non Af Amer: 85 mL/min — ABNORMAL LOW (ref >90)
Glucose, Bld: 171 mg/dL — ABNORMAL HIGH (ref 70–99)
Potassium: 3.7 mEq/L (ref 3.7–5.3)
Sodium: 135 mEq/L — ABNORMAL LOW (ref 137–147)

## 2013-06-19 LAB — CBC
HCT: 43.6 % (ref 36.0–46.0)
Hemoglobin: 15.6 g/dL — ABNORMAL HIGH (ref 12.0–15.0)
MCH: 32 pg (ref 26.0–34.0)
MCHC: 35.8 g/dL (ref 30.0–36.0)
MCV: 89.5 fL (ref 78.0–100.0)
Platelets: 316 10*3/uL (ref 150–400)
RBC: 4.87 MIL/uL (ref 3.87–5.11)
RDW: 13.1 % (ref 11.5–15.5)
WBC: 19.8 10*3/uL — ABNORMAL HIGH (ref 4.0–10.5)

## 2013-06-19 LAB — I-STAT TROPONIN, ED: Troponin i, poc: 0 ng/mL (ref 0.00–0.08)

## 2013-06-19 LAB — PRO B NATRIURETIC PEPTIDE: Pro B Natriuretic peptide (BNP): 139.2 pg/mL — ABNORMAL HIGH (ref 0–125)

## 2013-06-19 MED ORDER — TECHNETIUM TO 99M ALBUMIN AGGREGATED
6.0000 | Freq: Once | INTRAVENOUS | Status: AC | PRN
  Administered 2013-06-19: 6 via INTRAVENOUS

## 2013-06-19 MED ORDER — ALBUTEROL SULFATE (2.5 MG/3ML) 0.083% IN NEBU
5.0000 mg | INHALATION_SOLUTION | Freq: Once | RESPIRATORY_TRACT | Status: AC
  Administered 2013-06-19: 5 mg via RESPIRATORY_TRACT
  Filled 2013-06-19: qty 6

## 2013-06-19 MED ORDER — TECHNETIUM TC 99M DIETHYLENETRIAME-PENTAACETIC ACID: 40.0000 | Freq: Once | INTRAVENOUS | Status: DC | PRN

## 2013-06-19 MED ORDER — IPRATROPIUM BROMIDE 0.02 % IN SOLN
0.5000 mg | Freq: Once | RESPIRATORY_TRACT | Status: AC
  Administered 2013-06-19: 0.5 mg via RESPIRATORY_TRACT
  Filled 2013-06-19: qty 2.5

## 2013-06-19 NOTE — ED Notes (Signed)
Pt reports her doctor has been treating her for bronchitis for 3 weeks and she is not getting better. Today she was very SOB and wheezing in the office and her doctor wanted her to have a ct scan today. They sent her to [place] [facility] but their power was out so her doctor told her to come here. She has an allergy to ct contrast but has been able to tolerate it with premedication in the past. She states she feels "uncomfortable" in her chest

## 2013-06-19 NOTE — ED Notes (Signed)
Pt taken to nuclear medicine

## 2013-06-19 NOTE — ED Provider Notes (Signed)
CSN: [id number]     Arrival date & time 06/19/13  1647 History   First MD Initiated Contact with Patient 06/19/13 1754     Chief Complaint  Patient presents with  . Shortness of Breath     (Consider location/radiation/quality/duration/timing/severity/associated sxs/prior Treatment) HPI  68 year old female with history of COPD, hypertension, diabetes, fibromyalgia, and recurrent pneumonia who was sent here from her PCP for further evaluation of shortness of breath. For the past 3-4 weeks patient has had persistent cough, and having increased shortness of breath. She also endorsed wheezing for the past one to 2 weeks which is new. Her husband mention that patient has been coughing for the past 6 months. Cough is with white sputum. Pt normally doesn't use rescue inhaler but having using 3-4 times daily for the past week. Pt has been on steroid prescribed by her doctor.  Sts it has affected her CBG. No prior hx of CHF.  Sleep with 1 pillow, denies leg swelling.  No prior hx of intubation or ICU stay, no hx of asthma.  No fever, chills, runny nose, sneezing, sore throat, abdominal pain, back pain, or rash. She has been managed by her Dr. with treatment for bronchitis both with doxycycline, and was ciprofloxacin but is not getting any better. Today she went to her doctor's office for a regular checkup and she was noted to be wheezing. Her Dr. request patient [facility] Unfortunately patient has IV contrast allergy and the Imaging Center recommend having a VQ scan. However the imaging center lost power and therefore patient was recommended to come to ER for further evaluation.     Past Medical History  Diagnosis Date  . Hypertension     takes Maxzide daily  . Hyperlipidemia     takes Simvastatin daily  . Smokers' cough     has albuterol prn  . Peripheral neuropathy   . Fibromyalgia   . Lupus   . Neck pain     herniated disc and radiculopathy  . Urinary  frequency   . History of bladder infections     sees a urologist--on long term Macrodantin nightly  . History of blood transfusion     as a child  . Hypothyroidism     takes Synthroid daily  . Depression     takes Zoloft daily  . Insomnia     takes Ambien nightly  . Asthma   . COPD (chronic obstructive pulmonary disease)   . Pneumonia 2012    "have had it a couple times" (01/24/2012)  . Chronic bronchitis     "last time ~ 1 month ago" (01/24/2012)  . Type II diabetes mellitus     takes Janumet daily  . History of blood transfusion     "when I was a child" (01/24/2012)  . Arthritis     "little bit qwhere" (01/24/2012)  . Chronic back pain     "neck to tailbone" (01/24/2012)   Past Surgical History  Procedure Laterality Date  . Appendectomy  1960's  . Bladder suspension  1974  . Bladder surgery  1977    through abdomen  . Rectocele repair  2000  . Back surgery  2001  . Lumbar fusion  2009  . Colonoscopy    . Esophagogastroduodenoscopy    . Cardiac catheterization  2008  . Anterior cervical decomp/discectomy fusion  2002; 01/24/2012  . Vaginal hysterectomy  1974  . Bunionectomy  1980's    left  .  Anterior cervical decomp/discectomy fusion  01/24/2012    Procedure: ANTERIOR CERVICAL DECOMPRESSION/DISCECTOMY FUSION 1 LEVEL;  Surgeon: Cristi Loron, MD;  Location: [facility];  Service: Neurosurgery;  Laterality: N/A;  CERVICAL THREE-FOUR anterior cervical decompression with fusion interbody prothesis plating and bonegraft   History reviewed. No pertinent family history. History  Substance Use Topics  . Smoking status: Current Every Day Smoker -- 1.00 packs/day for 60 years    Types: Cigarettes  . Smokeless tobacco: Never Used  . Alcohol Use: No   OB History   Grav Para Term Preterm Abortions TAB SAB Ect Mult Living                 Review of Systems  Respiratory: Positive for cough and shortness of breath.   All other systems reviewed and are  negative.     Allergies  Erythromycin; Gadolinium derivatives; Iodinated diagnostic agents; Morphine; Penicillins; Prednisone; Sulfonamide derivatives; Aspirin; Latex; Other; and Varenicline tartrate  Home Medications   Prior to Admission medications   Medication Sig Start Date End Date Taking? Authorizing Provider  albuterol (PROVENTIL HFA;VENTOLIN HFA) 108 (90 BASE) MCG/ACT inhaler Inhale 2 puffs into the lungs every 6 (six) hours as needed. For shortness of breath    Historical Provider, MD  diazepam (VALIUM) 5 MG tablet Take 1 tablet (5 mg total) by mouth every 6 (six) hours as needed. 01/25/12   Cristi Loron, MD  docusate sodium 100 MG CAPS Take 100 mg by mouth 2 (two) times daily. 01/25/12   Cristi Loron, MD  gabapentin (NEURONTIN) 300 MG capsule Take 300 mg by mouth daily.    Historical Provider, MD  levothyroxine (SYNTHROID, LEVOTHROID) 112 MCG tablet Take 112 mcg by mouth daily.    Historical Provider, MD  nitrofurantoin (MACRODANTIN) 50 MG capsule Take 50 mg by mouth daily.    Historical Provider, MD  oxyCODONE-acetaminophen (PERCOCET) 10-325 MG per tablet Take 1 tablet by mouth 2 (two) times daily as needed. For shortness of breath    Historical Provider, MD  oxyCODONE-acetaminophen (PERCOCET) 10-325 MG per tablet Take 1 tablet by mouth every 4 (four) hours as needed for pain. 01/25/12   Cristi Loron, MD  sertraline (ZOLOFT) 100 MG tablet Take 100 mg by mouth daily.    Historical Provider, MD  simvastatin (ZOCOR) 20 MG tablet Take 20 mg by mouth every evening.    Historical Provider, MD  sitaGLIPtan-metformin (JANUMET) 50-1000 MG per tablet Take 1 tablet by mouth daily.    Historical Provider, MD  triamterene-hydrochlorothiazide (MAXZIDE-25) 37.5-25 MG per tablet Take 1 tablet by mouth daily.    Historical Provider, MD  zolpidem (AMBIEN) 5 MG tablet Take 5 mg by mouth at bedtime as needed. For sleep    Historical Provider, MD   BP 125/69  Pulse 72  Temp(Src) 99.2  F (37.3 C) (Oral)  Resp 17  Ht 5\' 5"  (1.651 m)  Wt 139 lb 9.6 oz (63.322 kg)  BMI 23.23 kg/m2  SpO2 97% Physical Exam  Nursing note and vitals reviewed. Constitutional: She is oriented to person, place, and time. She appears well-developed and well-nourished. No distress.  HENT:  Head: Atraumatic.  Mouth/Throat: Oropharynx is clear and moist.  Eyes: Conjunctivae are normal.  Neck: Neck supple. No JVD present.  Cardiovascular: Normal rate, regular rhythm and intact distal pulses.   Pulmonary/Chest:  Patient with mild respiratory distress speaking 3-4 to per sentence. Inspiratory and expiratory wheezes noted throughout.  No obvious rales. No accessory muscle use,  no stridor.  Abdominal: Soft. There is no tenderness.  Musculoskeletal: She exhibits no edema.  Neurological: She is alert and oriented to person, place, and time.  Skin: No rash noted.  Psychiatric: She has a normal mood and affect.    ED Course  Procedures (including critical care time)  6:18 PM Pt was sent here for further evaluation of her SOB and wheezes.  Pt is allergic to IVP dye with hives and throat involvement.  She does not want IV contrast.  Will perform CXR, along with VQ scan.  Pt has wheezes on exam, albuterol/atrovent nebs started.  Has leukocytosis, likely 2/2 recent steroid use.  No fever here.  Care discussed with Dr. Rubin PayorPickering.  8:02 PM Pt felt better after breathing treatment with improvement of lung sounds.  CXR with evidence of emphysema and bronchitis.  Pt has inhaler at home.  i offer additional breathing treatment but pt declined.  Will awaits VQ scan, if negative pt will be discharge with close f/u with her PCP.  Care discussed with oncoming provider who will continue care.  Will have pt ambulate and check O2.    Labs Review Labs Reviewed  CBC - Abnormal; Notable for the following:    WBC 19.8 (*)    Hemoglobin 15.6 (*)    All other components within normal limits  BASIC METABOLIC PANEL -  Abnormal; Notable for the following:    Sodium 135 (*)    Chloride 95 (*)    Glucose, Bld 171 (*)    GFR calc non Af Amer 85 (*)    All other components within normal limits  PRO B NATRIURETIC PEPTIDE - Abnormal; Notable for the following:    Pro B Natriuretic peptide (BNP) 139.2 (*)    All other components within normal limits  Rosezena SensorI-STAT TROPOININ, ED    Imaging Review Dg Chest 2 View  06/19/2013   CLINICAL DATA:  Shortness of breath.  Bronchitis.  COPD.  Diabetes.  EXAM: CHEST  2 VIEW  COMPARISON:  DG CHEST 2 VIEW dated 12/24/2011; CT ABDOMEN W/O CM dated 12/17/2012  FINDINGS: Emphysema noted. Atherosclerotic calcification in the aortic arch. Heart size within normal limits.  Mild central airway thickening.  No pleural effusion.  IMPRESSION: 1. Airway thickening is present, suggesting bronchitis or reactive airways disease. 2. Emphysema.   Electronically Signed   By: Herbie BaltimoreWalt  Liebkemann M.D.   On: 06/19/2013 18:46   Nm Pulmonary Perf And Vent  06/19/2013   CLINICAL DATA:  Shortness of breath.  EXAM: NUCLEAR MEDICINE VENTILATION - PERFUSION LUNG SCAN  TECHNIQUE: Ventilation images were obtained in multiple projections using inhaled aerosol technetium 99 M DTPA. Perfusion images were obtained in multiple projections after intravenous injection of Tc-3736m MAA.  RADIOPHARMACEUTICALS:  Forty mCi Tc-7536m DTPA aerosol and 6 mCi Tc-10936m MAA  COMPARISON:  Chest x-ray 06/19/2013  FINDINGS: Ventilation: Patchy bilateral ventilation defects, some a which may be related to DTPA clumping.  Perfusion: Few scattered nonsegmental perfusion defects, which correspond to ventilation defects.  IMPRESSION: No V/Q mismatches to suggest pulmonary embolus. Low probability study for pulmonary embolus.   Electronically Signed   By: Charlett NoseKevin  Dover M.D.   On: 06/19/2013 22:11     EKG Interpretation None      Date: 06/20/2013  Rate: 72  Rhythm: normal sinus rhythm  QRS Axis: normal  Intervals: normal  ST/T Wave abnormalities:  nonspecific ST/T changes  Conduction Disutrbances:none  Narrative Interpretation:   Old EKG Reviewed: unchanged    MDM  Final diagnoses:  EMPHYSEMA  Bronchitis    BP 112/54  Pulse 77  Temp(Src) 98.3 F (36.8 C) (Oral)  Resp 18  Ht 5\' 5"  (1.651 m)  Wt 139 lb 9.6 oz (63.322 kg)  BMI 23.23 kg/m2  SpO2 98%  I have reviewed nursing notes and vital signs. I personally reviewed the imaging tests through PACS system  I reviewed available ER/hospitalization records thought the EMR     Fayrene Helper, PA-C 06/20/13 1013

## 2013-06-19 NOTE — ED Notes (Signed)
Patient

## 2013-06-19 NOTE — ED Notes (Signed)
CBG 246-pt used own device and own insulin.

## 2013-06-19 NOTE — ED Notes (Signed)
Ambulated patient in hall. Patient's O2 saturation dropped to a low of 84 and a high of 91.

## 2013-06-19 NOTE — ED Notes (Signed)
Ambulated patient in the hall. Patient's O2 levels fluctuated between 84 and 91% saturation levels. Patient had very labored breathing just after two minutes of walking.  Patient recovered to 96% O2 saturation within 3 minutes of sitting back in the bed.

## 2013-06-19 NOTE — Discharge Instructions (Signed)
Please follow up closely with your doctor for further management of your condition.  No obvious evidence of blood clot in your lung on today's exam.  Use your inhaler as needed.    Bronchitis Bronchitis is inflammation of the airways that extend from the windpipe into the lungs (bronchi). The inflammation often causes mucus to develop, which leads to a cough. If the inflammation becomes severe, it may cause shortness of breath. CAUSES  Bronchitis may be caused by:   Viral infections.   Bacteria.   Cigarette smoke.   Allergens, pollutants, and other irritants.  SIGNS AND SYMPTOMS  The most common symptom of bronchitis is a frequent cough that produces mucus. Other symptoms include:  Fever.   Body aches.   Chest congestion.   Chills.   Shortness of breath.   Sore throat.  DIAGNOSIS  Bronchitis is usually diagnosed through a medical history and physical exam. Tests, such as chest X-rays, are sometimes done to rule out other conditions.  TREATMENT  You may need to avoid contact with whatever caused the problem (smoking, for example). Medicines are sometimes needed. These may include:  Antibiotics. These may be prescribed if the condition is caused by bacteria.  Cough suppressants. These may be prescribed for relief of cough symptoms.   Inhaled medicines. These may be prescribed to help open your airways and make it easier for you to breathe.   Steroid medicines. These may be prescribed for those with recurrent (chronic) bronchitis. HOME CARE INSTRUCTIONS  Get plenty of rest.   Drink enough fluids to keep your urine clear or pale yellow (unless you have a medical condition that requires fluid restriction). Increasing fluids may help thin your secretions and will prevent dehydration.   Only take over-the-counter or prescription medicines as directed by your health care provider.  Only take antibiotics as directed. Make sure you finish them even if you start to  feel better.  Avoid secondhand smoke, irritating chemicals, and strong fumes. These will make bronchitis worse. If you are a smoker, quit smoking. Consider using nicotine gum or skin patches to help control withdrawal symptoms. Quitting smoking will help your lungs heal faster.   Put a cool-mist humidifier in your bedroom at night to moisten the air. This may help loosen mucus. Change the water in the humidifier daily. You can also run the hot water in your shower and sit in the bathroom with the door closed for 5 10 minutes.   Follow up with your health care provider as directed.   Wash your hands frequently to avoid catching bronchitis again or spreading an infection to others.  SEEK MEDICAL CARE IF: Your symptoms do not improve after 1 week of treatment.  SEEK IMMEDIATE MEDICAL CARE IF:  Your fever increases.  You have chills.   You have chest pain.   You have worsening shortness of breath.   You have bloody sputum.  You faint.  You have lightheadedness.  You have a severe headache.   You vomit repeatedly. MAKE SURE YOU:   Understand these instructions.  Will watch your condition.  Will get help right away if you are not doing well or get worse. Document Released: 02/19/2005 Document Revised: 12/10/2012 Document Reviewed: 10/14/2012 [facility] Patient Information 2014 [place], [place].  Chronic Obstructive Pulmonary Disease Exacerbation  Chronic obstructive pulmonary disease (COPD) is a common lung problem. In COPD, the flow of air from the lungs is limited. COPD exacerbations are times that breathing gets worse and you need extra treatment. Without  treatment they can be life threatening. If they happen often, your lungs can become more damaged. HOME CARE  Do not smoke.  Avoid tobacco smoke and other things that bother your lungs.  If given, take your antibiotic medicine as told. Finish the medicine even if you start to feel better.  Only take medicines as  told by your doctor.  Drink enough fluids to keep your pee (urine) clear or pale yellow (unless your doctor has told you not to).  Use a cool mist machine (vaporizer).  If you use oxygen or a machine that turns liquid medicine into a mist (nebulizer), continue to use them as told.  Keep up with shots (vaccinations) as told by your doctor.  Exercise regularly.  Eat healthy foods.  Keep all doctor visits as told. GET HELP RIGHT AWAY IF:  You are very short of breath and it gets worse.  You have trouble talking.  You have bad chest pain.  You have blood in your spit (sputum).  You have a fever.  You keep throwing up (vomiting).  You feel weak, or you pass out (faint).  You feel confused.  You keep getting worse. MAKE SURE YOU:   Understand these instructions.  Will watch your condition.  Will get help right away if you are not doing well or get worse. Document Released: 02/08/2011 Document Revised: 12/10/2012 Document Reviewed: 10/24/2012 [facility] Patient Information 2014 [place], [place].

## 2013-06-21 NOTE — ED Provider Notes (Signed)
Medical screening examination/treatment/procedure(s) were performed by non-physician practitioner and as supervising physician I was immediately available for consultation/collaboration.   EKG Interpretation None       Vernell Back R. Braleigh Massoud, MD 06/21/13 1057 

## 2013-12-18 ENCOUNTER — Other Ambulatory Visit: Payer: Self-pay

## 2013-12-30 ENCOUNTER — Other Ambulatory Visit: Payer: Self-pay | Admitting: Internal Medicine

## 2013-12-30 DIAGNOSIS — R1084 Generalized abdominal pain: Secondary | ICD-10-CM

## 2014-01-06 ENCOUNTER — Ambulatory Visit
Admission: RE | Admit: 2014-01-06 | Discharge: 2014-01-06 | Disposition: A | Discharge status: Home / Self Care | Payer: Medicare Other | Source: Ambulatory Visit | Attending: Internal Medicine | Admitting: Internal Medicine

## 2014-01-06 DIAGNOSIS — R1084 Generalized abdominal pain: Secondary | ICD-10-CM

## 2014-01-06 MED ORDER — IOHEXOL 300 MG/ML  SOLN
100.0000 mL | Freq: Once | INTRAMUSCULAR | Status: AC | PRN
  Administered 2014-01-06: 100 mL via INTRAVENOUS

## 2014-01-12 ENCOUNTER — Encounter (HOSPITAL_COMMUNITY): Payer: Self-pay | Admitting: Emergency Medicine

## 2014-01-12 ENCOUNTER — Emergency Department (HOSPITAL_COMMUNITY)
Admission: EM | Admit: 2014-01-12 | Discharge: 2014-01-12 | Discharge status: Against Medical Advice | Payer: Medicare Other | Attending: Emergency Medicine | Admitting: Emergency Medicine

## 2014-01-12 DIAGNOSIS — Z720 Tobacco use: Secondary | ICD-10-CM | POA: Insufficient documentation

## 2014-01-12 DIAGNOSIS — T43225A Adverse effect of selective serotonin reuptake inhibitors, initial encounter: Secondary | ICD-10-CM | POA: Diagnosis not present

## 2014-01-12 DIAGNOSIS — R590 Localized enlarged lymph nodes: Secondary | ICD-10-CM | POA: Insufficient documentation

## 2014-01-12 DIAGNOSIS — E119 Type 2 diabetes mellitus without complications: Secondary | ICD-10-CM | POA: Insufficient documentation

## 2014-01-12 DIAGNOSIS — I10 Essential (primary) hypertension: Secondary | ICD-10-CM | POA: Insufficient documentation

## 2014-01-12 DIAGNOSIS — G8929 Other chronic pain: Secondary | ICD-10-CM | POA: Insufficient documentation

## 2014-01-12 DIAGNOSIS — J449 Chronic obstructive pulmonary disease, unspecified: Secondary | ICD-10-CM | POA: Insufficient documentation

## 2014-01-12 DIAGNOSIS — R008 Other abnormalities of heart beat: Secondary | ICD-10-CM | POA: Diagnosis present

## 2014-01-12 LAB — RAPID URINE DRUG SCREEN, HOSP PERFORMED
Amphetamines: NOT DETECTED
Barbiturates: NOT DETECTED
Benzodiazepines: NOT DETECTED
Cocaine: NOT DETECTED
Opiates: NOT DETECTED
Tetrahydrocannabinol: NOT DETECTED

## 2014-01-12 LAB — URINALYSIS, ROUTINE W REFLEX MICROSCOPIC
Bilirubin Urine: NEGATIVE
Glucose, UA: NEGATIVE mg/dL
Hgb urine dipstick: NEGATIVE
Ketones, ur: NEGATIVE mg/dL
Nitrite: NEGATIVE
Protein, ur: NEGATIVE mg/dL
Specific Gravity, Urine: 1.009 (ref 1.005–1.030)
Urobilinogen, UA: 0.2 mg/dL (ref 0.0–1.0)
pH: 7.5 (ref 5.0–8.0)

## 2014-01-12 LAB — COMPREHENSIVE METABOLIC PANEL
ALT: 32 U/L (ref 0–35)
AST: 32 U/L (ref 0–37)
Albumin: 4.2 g/dL (ref 3.5–5.2)
Alkaline Phosphatase: 72 U/L (ref 39–117)
Anion gap: 17 — ABNORMAL HIGH (ref 5–15)
BUN: 8 mg/dL (ref 6–23)
CO2: 27 mEq/L (ref 19–32)
Calcium: 10 mg/dL (ref 8.4–10.5)
Chloride: 93 mEq/L — ABNORMAL LOW (ref 96–112)
Creatinine, Ser: 0.73 mg/dL (ref 0.50–1.10)
GFR calc Af Amer: >90 mL/min (ref >90)
GFR calc non Af Amer: 85 mL/min — ABNORMAL LOW (ref >90)
Glucose, Bld: 113 mg/dL — ABNORMAL HIGH (ref 70–99)
Potassium: 3.7 mEq/L (ref 3.7–5.3)
Sodium: 137 mEq/L (ref 137–147)
Total Bilirubin: 0.5 mg/dL (ref 0.3–1.2)
Total Protein: 8.4 g/dL — ABNORMAL HIGH (ref 6.0–8.3)

## 2014-01-12 LAB — CBC WITH DIFFERENTIAL/PLATELET
Basophils Absolute: 0.1 10*3/uL (ref 0.0–0.1)
Basophils Relative: 1 % (ref 0–1)
Eosinophils Absolute: 0.4 10*3/uL (ref 0.0–0.7)
Eosinophils Relative: 3 % (ref 0–5)
HCT: 41 % (ref 36.0–46.0)
Hemoglobin: 14.4 g/dL (ref 12.0–15.0)
Lymphocytes Relative: 19 % (ref 12–46)
Lymphs Abs: 2.5 10*3/uL (ref 0.7–4.0)
MCH: 30.6 pg (ref 26.0–34.0)
MCHC: 35.1 g/dL (ref 30.0–36.0)
MCV: 87 fL (ref 78.0–100.0)
Monocytes Absolute: 0.7 10*3/uL (ref 0.1–1.0)
Monocytes Relative: 5 % (ref 3–12)
Neutro Abs: 9.2 10*3/uL — ABNORMAL HIGH (ref 1.7–7.7)
Neutrophils Relative %: 72 % (ref 43–77)
Platelets: 304 10*3/uL (ref 150–400)
RBC: 4.71 MIL/uL (ref 3.87–5.11)
RDW: 12.7 % (ref 11.5–15.5)
WBC: 12.9 10*3/uL — ABNORMAL HIGH (ref 4.0–10.5)

## 2014-01-12 LAB — ETHANOL: Alcohol, Ethyl (B): <11 mg/dL (ref 0–11)

## 2014-01-12 LAB — URINE MICROSCOPIC-ADD ON

## 2014-01-12 MED ORDER — LORAZEPAM 0.5 MG PO TABS
0.5000 mg | ORAL_TABLET | Freq: Once | ORAL | Status: AC
  Administered 2014-01-12: 0.5 mg via ORAL
  Filled 2014-01-12: qty 1

## 2014-01-12 NOTE — ED Notes (Signed)
Pt states that she was taken off of her Zoloft abruptly 6 days ago because of swollen abdominal lymph nodes and a possible serotonin syndrome. Pt now feels like she has 'crashed' and denies SI/HI but has feelings of 'just not wanting to be here anymore.' Alert and oriented at this time.

## 2014-01-12 NOTE — ED Notes (Signed)
Pt wanted to go home at this time------ pt states, "Four hours for me is too long, I need to go home.  I just need to follow up with my doctor and I think they have already called in for my new medicine--- it's Wellbutrin, I think".

## 2014-01-12 NOTE — ED Provider Notes (Signed)
Patient left the department without being seen. I was unable to evaluate or speak to this patient prior to departure.   Mora BellmanHannah S Lynford Espinoza, PA-C 01/12/14 2038  Tilden FossaElizabeth Rees, MD 01/16/14 [phone]

## 2014-04-30 ENCOUNTER — Other Ambulatory Visit: Payer: Self-pay

## 2014-04-30 DIAGNOSIS — Z1231 Encounter for screening mammogram for malignant neoplasm of breast: Secondary | ICD-10-CM

## 2014-05-12 ENCOUNTER — Ambulatory Visit
Admission: RE | Admit: 2014-05-12 | Discharge: 2014-05-12 | Disposition: A | Discharge status: Home / Self Care | Payer: Medicare Other | Source: Ambulatory Visit

## 2014-05-12 DIAGNOSIS — Z1231 Encounter for screening mammogram for malignant neoplasm of breast: Secondary | ICD-10-CM

## 2014-05-31 ENCOUNTER — Other Ambulatory Visit: Payer: Self-pay | Admitting: *Deleted

## 2014-05-31 DIAGNOSIS — I83812 Varicose veins of left lower extremities with pain: Secondary | ICD-10-CM

## 2014-07-23 ENCOUNTER — Encounter: Payer: Self-pay | Admitting: Vascular Surgery

## 2014-07-27 ENCOUNTER — Ambulatory Visit (INDEPENDENT_AMBULATORY_CARE_PROVIDER_SITE_OTHER): Payer: Medicare Other | Admitting: Vascular Surgery

## 2014-07-27 ENCOUNTER — Encounter: Payer: Self-pay | Admitting: Vascular Surgery

## 2014-07-27 ENCOUNTER — Ambulatory Visit (HOSPITAL_COMMUNITY)
Admission: RE | Admit: 2014-07-27 | Discharge: 2014-07-27 | Disposition: A | Discharge status: Home / Self Care | Payer: Medicare Other | Source: Ambulatory Visit | Attending: Vascular Surgery | Admitting: Vascular Surgery

## 2014-07-27 VITALS — BP 140/60 | HR 78 | Resp 16 | Ht 65.0 in | Wt 149.0 lb

## 2014-07-27 DIAGNOSIS — M79605 Pain in left leg: Secondary | ICD-10-CM

## 2014-07-27 DIAGNOSIS — I83812 Varicose veins of left lower extremities with pain: Secondary | ICD-10-CM | POA: Insufficient documentation

## 2014-07-27 NOTE — Progress Notes (Signed)
Subjective:     Patient ID: [name], female   DOB: 18-Jun-1945, 69 y.o.   MRN: [id number]  HPI this 69 year old female is referred by Dr. Andi Devon for evaluation of possible varicose veins left leg. Patient has been having pain in the left leg for the last several months beginning at the knee anteriorly extending down the pretibial region to the ankle. This distal edema. She has no history of painful bulging varicosities but his hands noted some spider veins. She possibly has a remote history of a DVT in her left leg when she was in college while on oral contraception use. She does not rule elastic compression stockings. Her pain is worse when she is lying supine in bed but eventually it will subside.  Past Medical History  Diagnosis Date  . Hypertension     takes Maxzide daily  . Hyperlipidemia     takes Simvastatin daily  . Smokers' cough     has albuterol prn  . Peripheral neuropathy   . Fibromyalgia   . Lupus   . Neck pain     herniated disc and radiculopathy  . Urinary frequency   . History of bladder infections     sees a urologist--on long term Macrodantin nightly  . History of blood transfusion     as a child  . Hypothyroidism     takes Synthroid daily  . Depression     takes Zoloft daily  . Insomnia     takes Ambien nightly  . Asthma   . COPD (chronic obstructive pulmonary disease)   . Pneumonia 2012    "have had it a couple times" (01/24/2012)  . Chronic bronchitis     "last time ~ 1 month ago" (01/24/2012)  . Type II diabetes mellitus     takes Janumet daily  . History of blood transfusion     "when I was a child" (01/24/2012)  . Arthritis     "little bit qwhere" (01/24/2012)  . Chronic back pain     "neck to tailbone" (01/24/2012)    History  Substance Use Topics  . Smoking status: Current Every Day Smoker -- 1.00 packs/day for 60 years    Types: Cigarettes  . Smokeless tobacco: Never Used  . Alcohol Use: No    History reviewed. No  pertinent family history.  Allergies  Allergen Reactions  . Erythromycin Anaphylaxis  . Gadolinium Derivatives Hives and Rash    Pt broke out in hives all over body.  Has IV steroids and Benadryl in hospital for MRI.  Marland Kitchen Iodinated Diagnostic Agents Hives    13-hour prep Pt had slight itching of her abd after her injection and had taken her 13 hr prep.  Consult with radiologist before another procedure to see if IV contrast is necessary, per Dr Roswell Nickel on 01/06/14, JB/  . Morphine Nausea And Vomiting  . Penicillins Anaphylaxis  . Prednisone Other (See Comments)    "shakes me out of my frame" IM steroids aren't as bad as PO  . Sulfonamide Derivatives Itching and Rash    All over body   . Aspirin Nausea Only and Other (See Comments)    Burning in stomach   . Latex Dermatitis and Other (See Comments)    blisters  . Other Itching and Other (See Comments)    Chlorhexin (CHG); "burning"  . Varenicline Tartrate Nausea And Vomiting          Current outpatient prescriptions:  .  buPROPion (ZYBAN) 150 MG  12 hr tablet, Take 150 mg by mouth 2 (two) times daily., Disp: , Rfl:  .  Insulin Glargine (TOUJEO SOLOSTAR) 300 UNIT/ML SOPN, Inject 20-40 Units into the skin daily. If Blood GLucose Lvl is Under 200 use 20units if lvl is  above 200 use 40 units;, Disp: , Rfl:  .  levothyroxine (SYNTHROID, LEVOTHROID) 112 MCG tablet, Take 112 mcg by mouth daily., Disp: , Rfl:  .  metoCLOPramide (REGLAN) 5 MG tablet, Take 5 mg by mouth 2 (two) times daily., Disp: , Rfl:  .  oxyCODONE-acetaminophen (PERCOCET) 10-325 MG per tablet, Take 1 tablet by mouth every 4 (four) hours as needed for pain., Disp: 100 tablet, Rfl: 0 .  pantoprazole (PROTONIX) 40 MG tablet, Take 40 mg by mouth daily., Disp: , Rfl:  .  simvastatin (ZOCOR) 20 MG tablet, Take 20 mg by mouth every evening., Disp: , Rfl:  .  sitaGLIPtin-metformin (JANUMET) 50-1000 MG per tablet, Take 1 tablet by mouth 2 (two) times daily with a meal. 50-100mg ,  Disp: , Rfl:  .  triamterene-hydrochlorothiazide (MAXZIDE-25) 37.5-25 MG per tablet, Take 1 tablet by mouth daily., Disp: , Rfl:  .  zolpidem (AMBIEN) 5 MG tablet, Take 5 mg by mouth at bedtime. For sleep, Disp: , Rfl:  .  albuterol (PROVENTIL HFA;VENTOLIN HFA) 108 (90 BASE) MCG/ACT inhaler, Inhale 2 puffs into the lungs every 6 (six) hours as needed. For shortness of breath, Disp: , Rfl:  .  docusate sodium (COLACE) 100 MG capsule, Take 100 mg by mouth daily., Disp: , Rfl:  .  insulin glargine (LANTUS) 100 UNIT/ML injection, Inject 2-24 Units into the skin 2 (two) times daily. Take 1 unit for every 10 points over 140., Disp: , Rfl:  .  sertraline (ZOLOFT) 100 MG tablet, Take 50 mg by mouth daily. , Disp: , Rfl:   Filed Vitals:   07/27/14 1308 07/27/14 1309  BP: 145/62 140/60  Pulse: 78   Resp: 16   Height: 5\' 5"  (1.651 m)   Weight: 149 lb (67.586 kg)     Body mass index is 24.79 kg/(m^2).           Review of Systems denies chest pain but does have occasional leg pain with walking. Complains of weakness in legs at times. Other systems negative and a complete review of systems remote history of back surgery.     Objective:   Physical Exam BP 140/60 mmHg  Pulse 78  Resp 16  Ht 5\' 5"  (1.651 m)  Wt 149 lb (67.586 kg)  BMI 24.79 kg/m2  Gen.-alert and oriented x3 in no apparent distress HEENT normal for age Lungs no rhonchi or wheezing Cardiovascular regular rhythm no murmurs carotid pulses 3+ palpable no bruits audible Abdomen soft nontender no palpable masses Musculoskeletal free of  major deformities Skin clear -no rashes Neurologic normal Lower extremities 3+ femoral and dorsalis pedis pulses palpable bilaterally with no edema A few scattered spider veins particular on dorsum of left foot and lateral malleolar area with no hyperpigmentation or ulceration noted.  Today I ordered a venous duplex exam of the left leg which I reviewed and interpreted. There is no DVT.  There is mild reflux in the left great saphenous vein but the vein is small caliber.       Assessment:     Left leg pain not due to arterial or venous insufficiency-etiology unknown History of lumbar spine disease requiring surgery    Plan:     No need for further arterial  or venous evaluation We'll refer her back to medical doctor for further evaluation of left knee and pretibial pain

## 2014-08-30 ENCOUNTER — Other Ambulatory Visit: Payer: Self-pay

## 2015-03-02 ENCOUNTER — Encounter: Payer: Self-pay | Admitting: *Deleted

## 2015-04-20 IMAGING — US US RENAL
1 series · 14 of 25 positions shown · non-contrast
Comparison: MRI 07/09/2012.  Ultrasound 05/23/2006.

CLINICAL DATA: Fall renal cyst

RENAL/URINARY TRACT ULTRASOUND COMPLETE

[Series 1: us renal · 0.19mm/px · 14 of 36 slices shown]
[im 1/36]
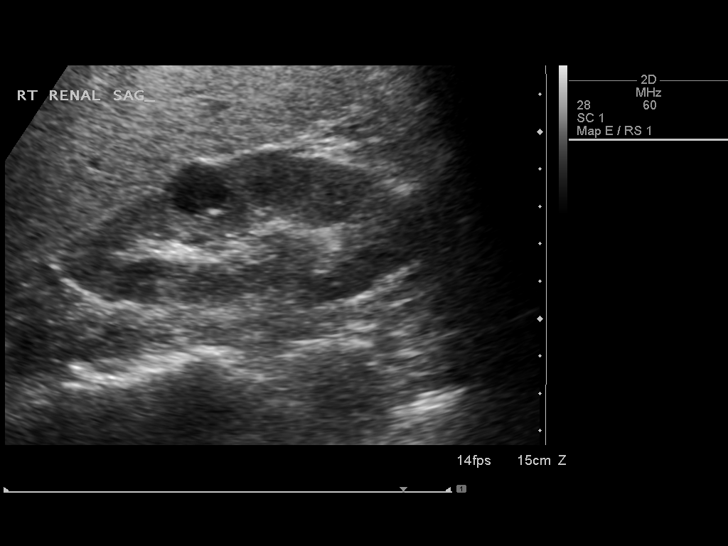
[im 3/36]
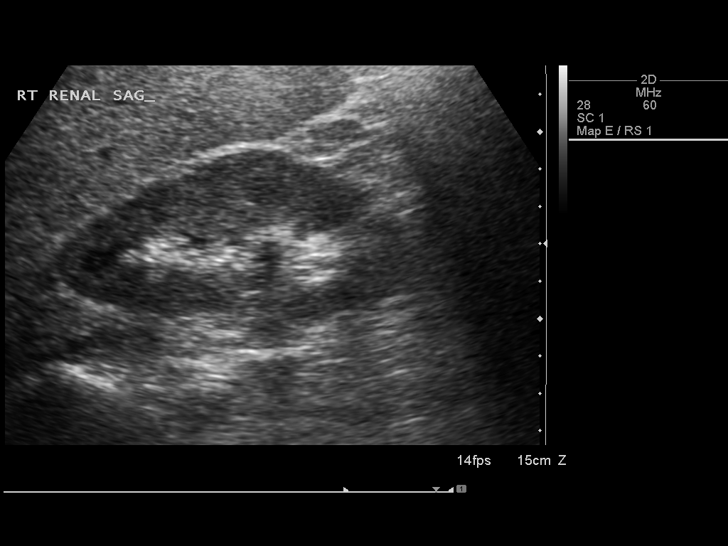
[im 6/36]
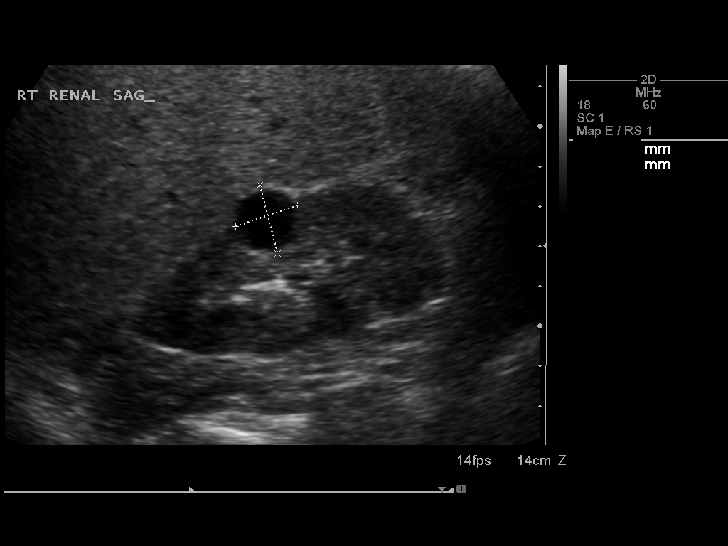
[im 9/36]
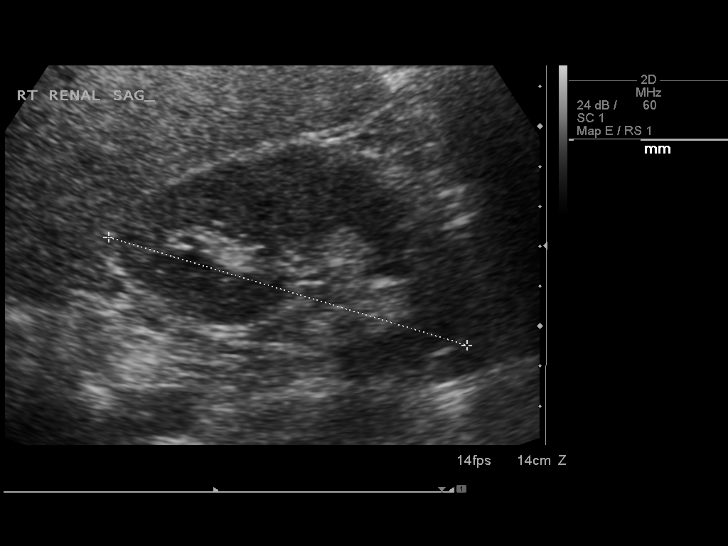
[im 12/36]
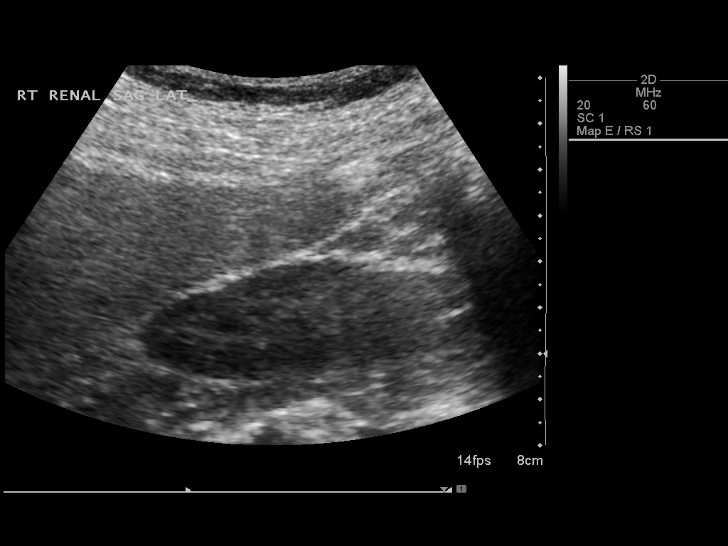
[im 14/36]
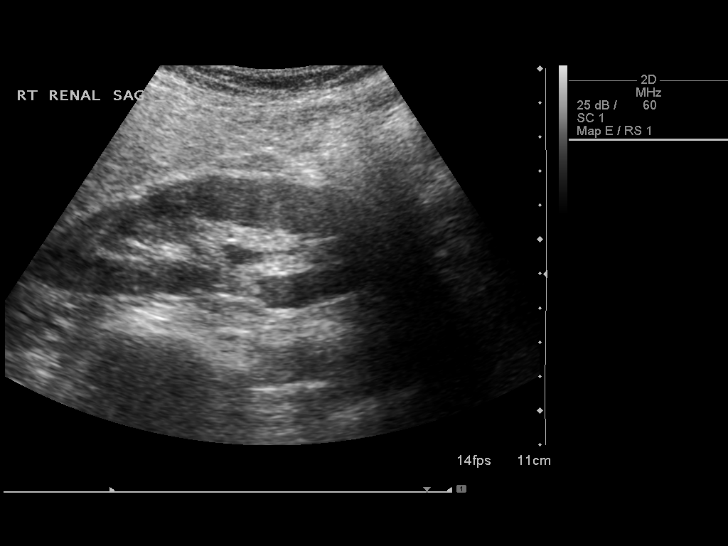
[im 17/36]
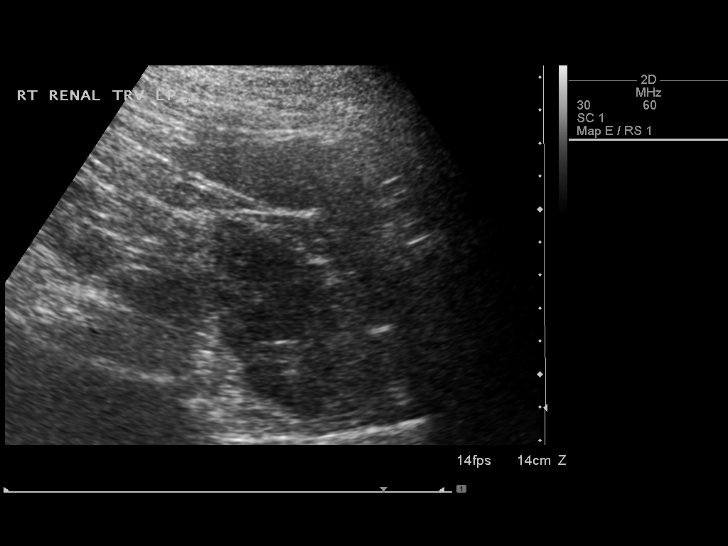
[im 19/36]
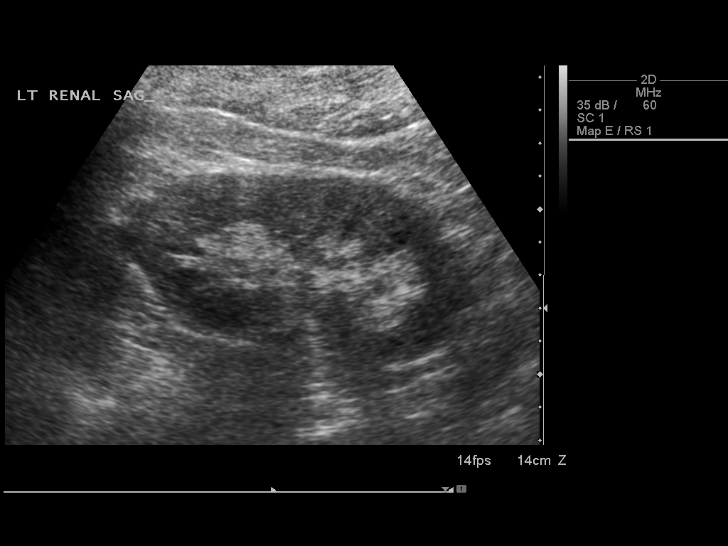
[im 22/36]
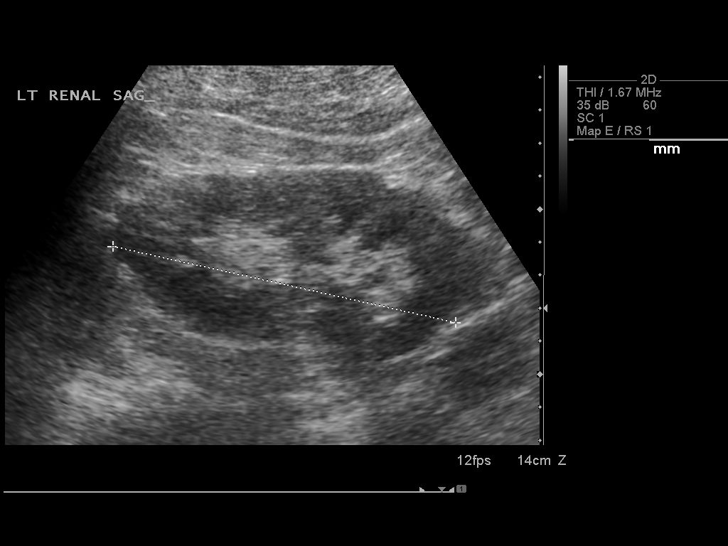
[im 24/36]
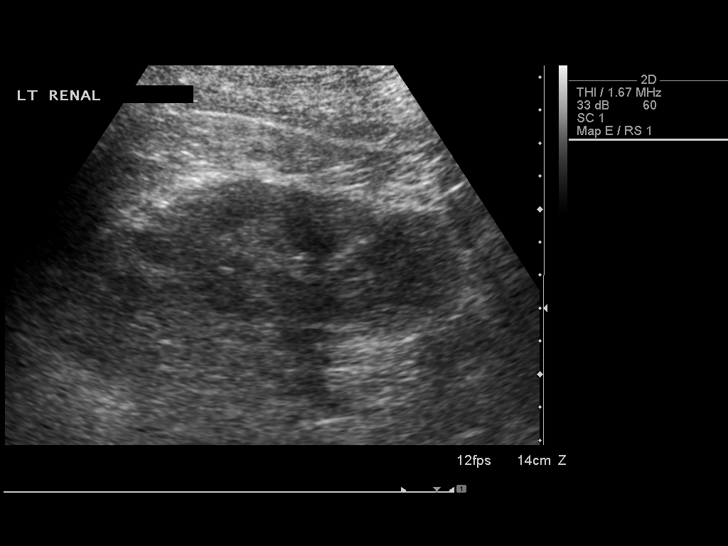
[im 27/36]
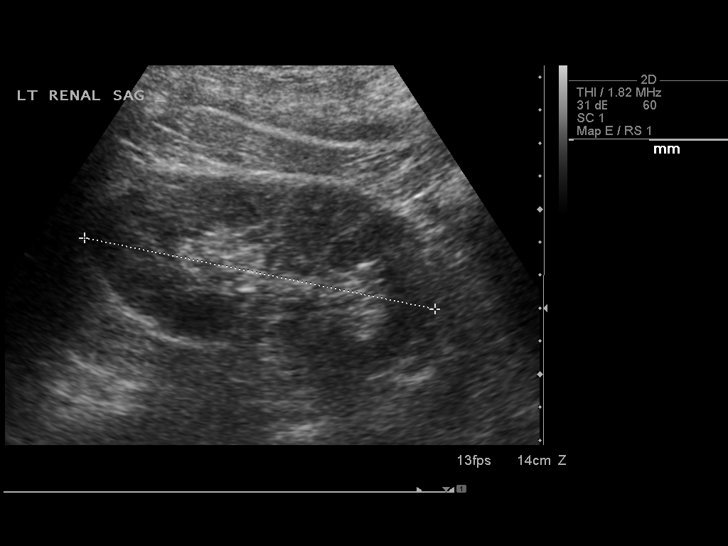
[im 30/36]
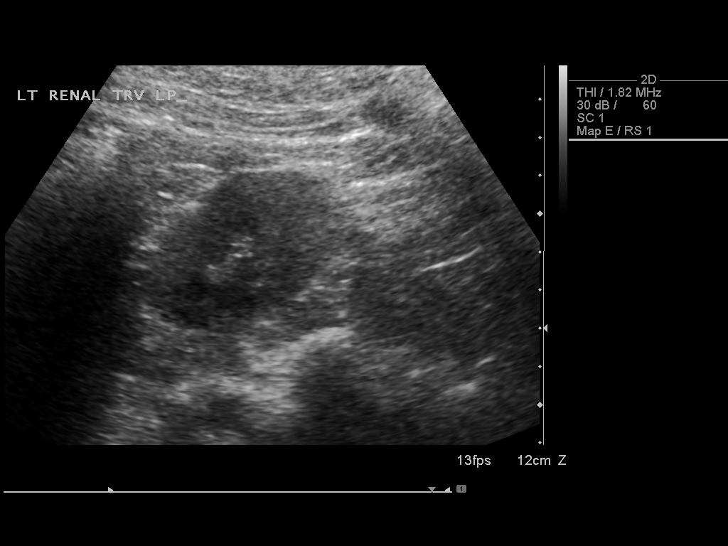
[im 33/36]
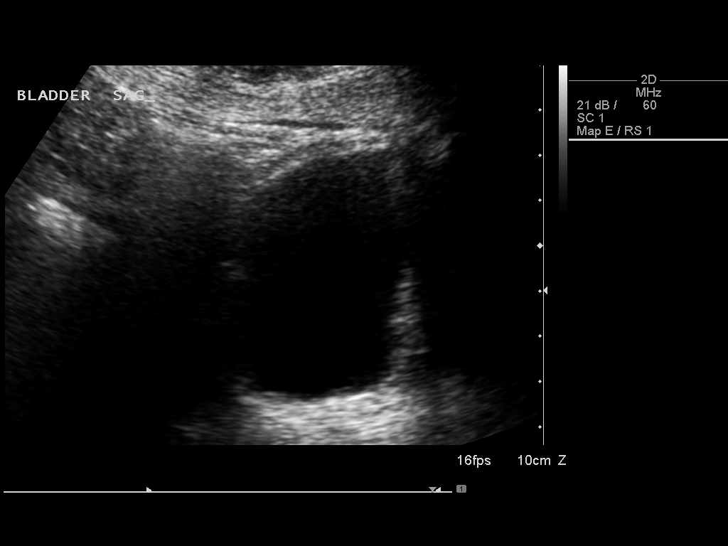
[im 36/36]
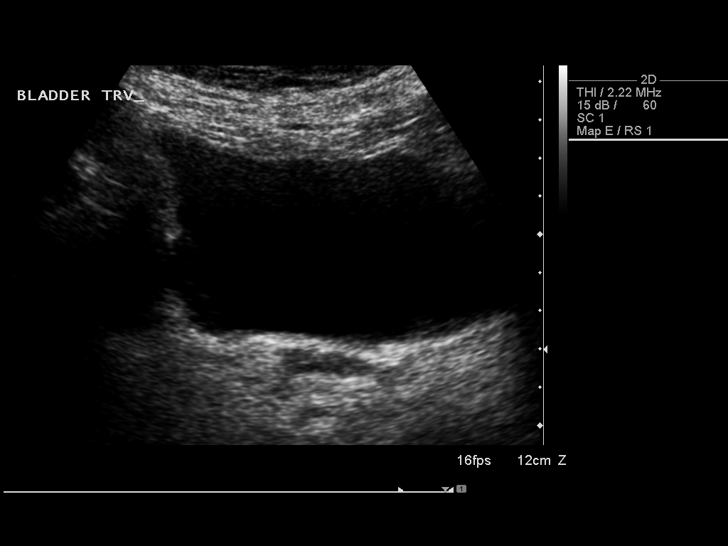

[14 of 25 positions shown; findings below may reference images not displayed]

FINDINGS: Right Kidney:  9.9 cm length.  No solid mass or hydronephrosis.
Normal cortical echogenicity.  Benign appearing 1.8 x 1.8 x 1.6 cm
simple cyst.

Left Kidney:  10.8 cm in length.  No hydronephrosis or mass.
Normal cortical echogenicity.

Bladder:  Within normal limits.
IMPRESSION: Simple cyst in the right kidney.  No acute renal pathology.

## 2015-04-25 ENCOUNTER — Other Ambulatory Visit: Payer: Self-pay

## 2015-04-25 DIAGNOSIS — Z1231 Encounter for screening mammogram for malignant neoplasm of breast: Secondary | ICD-10-CM

## 2015-05-03 ENCOUNTER — Ambulatory Visit (INDEPENDENT_AMBULATORY_CARE_PROVIDER_SITE_OTHER): Payer: Medicare Other | Admitting: Cardiovascular Disease

## 2015-05-03 ENCOUNTER — Encounter: Payer: Self-pay | Admitting: *Deleted

## 2015-05-03 ENCOUNTER — Encounter: Payer: Self-pay | Admitting: Cardiovascular Disease

## 2015-05-03 VITALS — BP 118/72 | HR 72 | Ht 65.0 in | Wt 147.0 lb

## 2015-05-03 DIAGNOSIS — E785 Hyperlipidemia, unspecified: Secondary | ICD-10-CM

## 2015-05-03 DIAGNOSIS — I10 Essential (primary) hypertension: Secondary | ICD-10-CM | POA: Diagnosis not present

## 2015-05-03 DIAGNOSIS — R079 Chest pain, unspecified: Secondary | ICD-10-CM | POA: Diagnosis not present

## 2015-05-03 MED ORDER — NITROGLYCERIN 0.4 MG SL SUBL
0.4000 mg | SUBLINGUAL_TABLET | SUBLINGUAL | Status: DC | PRN
Start: 2015-05-03 — End: 2021-08-09

## 2015-05-03 MED ORDER — NITROGLYCERIN 0.4 MG SL SUBL: 0.4000 mg | SUBLINGUAL_TABLET | SUBLINGUAL | Status: DC | PRN

## 2015-05-03 MED ORDER — ASPIRIN EC 81 MG PO TBEC: 81.0000 mg | DELAYED_RELEASE_TABLET | Freq: Every day | ORAL | Status: DC

## 2015-05-03 NOTE — Patient Instructions (Signed)
Your physician has recommended you make the following change in your medication:  Start aspirin 81 mg daily. Start nitroglycerin 0.4 mg sublingual every 5 minutes up to 3 doses in a 15 minute increment for severe chest pain. If no relief after 3rd dose, proceed to the ED for an evaluation. Continue all other medications the same. Your physician has requested that you have en exercise stress myoview. For further information please visit [email or web address] Please follow instruction sheet, as given. Your physician recommends that you schedule a follow-up appointment in: 3 weeks.

## 2015-05-03 NOTE — Progress Notes (Signed)
Patient ID: [name], female   DOB: 01-Jan-1946, 70 y.o.   MRN: [id number]       CARDIOLOGY CONSULT NOTE  Patient ID: [name] MRN: [id number] DOB/AGE: [birth year] 70 y.o.  Admit date: (Not on file) Primary Physician Salena Saner., MD  Reason for Consultation: chest pain  HPI: The patient is a 70 year old woman (prefers to be called "[name]") with a history of insulin-dependent diabetes mellitus and hypertension. She reportedly underwent cardiac catheterization 10 years ago by Dr. Haroldine Laws.  She has been experiencing chest pain at rest for the past 3-1/2 weeks. She was initially treated for bronchitis and thought it was related to this. The chest pain is retrosternal and radiates into her back, jaw, and left shoulder. She also has a history of esophageal spasm and wonders if it was related to this. Her husband has sublingual nitroglycerin and she used 2 tablets last night with relief of the pain. She has associated shortness of breath, nausea, and dizziness. She denies palpitations and syncope.  ECG performed in the office today demonstrates normal sinus rhythm with no ischemic ST segment or T-wave abnormalities.  Fam: Mother died of heart failure at 28. Father had a pacemaker, died of multiple myeloma at 94.   Allergies  Allergen Reactions  . Erythromycin Anaphylaxis  . Gadolinium Derivatives Hives and Rash    Pt broke out in hives all over body.  Has IV steroids and Benadryl in hospital for MRI.  Marland Kitchen Iodinated Diagnostic Agents Hives    13-hour prep Pt had slight itching of her abd after her injection and had taken her 13 hr prep.  Consult with radiologist before another procedure to see if IV contrast is necessary, per Dr Almyra Free on 01/06/14, JB/  . Morphine Nausea And Vomiting  . Penicillins Anaphylaxis  . Prednisone Other (See Comments)    "shakes me out of my frame" IM steroids aren't as bad as PO  . Sulfonamide Derivatives Itching and Rash    All  over body   . Aspirin Nausea Only and Other (See Comments)    Burning in stomach   . Latex Dermatitis and Other (See Comments)    blisters  . Other Itching and Other (See Comments)    Chlorhexin (CHG); "burning"  . Varenicline Tartrate Nausea And Vomiting         Current Outpatient Prescriptions  Medication Sig Dispense Refill  . buPROPion (ZYBAN) 150 MG 12 hr tablet Take 150 mg by mouth 2 (two) times daily.    Marland Kitchen docusate sodium (COLACE) 100 MG capsule Take 100 mg by mouth daily.    . Insulin Glargine (TOUJEO SOLOSTAR) 300 UNIT/ML SOPN Inject 20-40 Units into the skin daily. If Blood GLucose Lvl is Under 200 use 20units if lvl is  above 200 use 40 units;    . levothyroxine (SYNTHROID, LEVOTHROID) 112 MCG tablet Take 112 mcg by mouth daily.    . metoCLOPramide (REGLAN) 5 MG tablet Take 5 mg by mouth 2 (two) times daily.    . pantoprazole (PROTONIX) 40 MG tablet Take 40 mg by mouth daily.    . sertraline (ZOLOFT) 100 MG tablet Take 50 mg by mouth daily.     . simvastatin (ZOCOR) 20 MG tablet Take 20 mg by mouth every evening.    . triamterene-hydrochlorothiazide (MAXZIDE-25) 37.5-25 MG per tablet Take 1 tablet by mouth daily.    Marland Kitchen zolpidem (AMBIEN) 5 MG tablet Take 5 mg by mouth at bedtime. For sleep  No current facility-administered medications for this visit.    Past Medical History  Diagnosis Date  . Hypertension     takes Maxzide daily  . Hyperlipidemia     takes Simvastatin daily  . Smokers' cough (HCC)     has albuterol prn  . Peripheral neuropathy ([place])   . Fibromyalgia   . Lupus ([place])   . Neck pain     herniated disc and radiculopathy  . Urinary frequency   . History of bladder infections     sees a urologist--on long term Macrodantin nightly  . History of blood transfusion     as a child  . Hypothyroidism     takes Synthroid daily  . Depression     takes Zoloft daily  . Insomnia     takes Ambien nightly  . Asthma   . COPD (chronic obstructive pulmonary  disease) ([place])   . Pneumonia 2012    "have had it a couple times" (01/24/2012)  . Chronic bronchitis ([place])     "last time ~ 1 month ago" (01/24/2012)  . Type II diabetes mellitus (HCC)     takes Janumet daily  . History of blood transfusion     "when I was a child" (01/24/2012)  . Arthritis     "little bit qwhere" (01/24/2012)  . Chronic back pain     "neck to tailbone" (01/24/2012)    Past Surgical History  Procedure Laterality Date  . Appendectomy  1960's  . Bladder suspension  1974  . Bladder surgery  1977    through abdomen  . Rectocele repair  2000  . Back surgery  2001  . Lumbar fusion  2009  . Colonoscopy    . Esophagogastroduodenoscopy    . Cardiac catheterization  2008  . Anterior cervical decomp/discectomy fusion  2002; 01/24/2012  . Vaginal hysterectomy  1974  . Bunionectomy  1980's    left  . Anterior cervical decomp/discectomy fusion  01/24/2012    Procedure: ANTERIOR CERVICAL DECOMPRESSION/DISCECTOMY FUSION 1 LEVEL;  Surgeon: Ophelia Charter, MD;  Location: [facility];  Service: Neurosurgery;  Laterality: N/A;  CERVICAL THREE-FOUR anterior cervical decompression with fusion interbody prothesis plating and bonegraft    Social History   Social History  . Marital Status: Married    Spouse Name: N/A  . Number of Children: N/A  . Years of Education: N/A   Occupational History  . Not on file.   Social History Main Topics  . Smoking status: Current Some Day Smoker -- 1.00 packs/day for 60 years    Types: Cigarettes  . Smokeless tobacco: Current User     Comment: vapor ciggs  . Alcohol Use: No  . Drug Use: No  . Sexual Activity: No   Other Topics Concern  . Not on file   Social History Narrative       Prior to Admission medications   Medication Sig Start Date End Date Taking? Authorizing Provider  buPROPion (ZYBAN) 150 MG 12 hr tablet Take 150 mg by mouth 2 (two) times daily.   Yes Historical Provider, MD  docusate sodium (COLACE) 100 MG  capsule Take 100 mg by mouth daily.   Yes Historical Provider, MD  Insulin Glargine (TOUJEO SOLOSTAR) 300 UNIT/ML SOPN Inject 20-40 Units into the skin daily. If Blood GLucose Lvl is Under 200 use 20units if lvl is  above 200 use 40 units;   Yes Historical Provider, MD  levothyroxine (SYNTHROID, LEVOTHROID) 112 MCG tablet Take 112 mcg by mouth  daily.   Yes Historical Provider, MD  metoCLOPramide (REGLAN) 5 MG tablet Take 5 mg by mouth 2 (two) times daily.   Yes Historical Provider, MD  pantoprazole (PROTONIX) 40 MG tablet Take 40 mg by mouth daily.   Yes Historical Provider, MD  sertraline (ZOLOFT) 100 MG tablet Take 50 mg by mouth daily.    Yes Historical Provider, MD  simvastatin (ZOCOR) 20 MG tablet Take 20 mg by mouth every evening.   Yes Historical Provider, MD  triamterene-hydrochlorothiazide (MAXZIDE-25) 37.5-25 MG per tablet Take 1 tablet by mouth daily.   Yes Historical Provider, MD  zolpidem (AMBIEN) 5 MG tablet Take 5 mg by mouth at bedtime. For sleep   Yes Historical Provider, MD     Review of systems complete and found to be negative unless listed above in HPI     Physical exam Blood pressure 118/72, pulse 72, height 5' 5"  (1.651 m), weight 147 lb (66.679 kg), SpO2 98 %. General: NAD Neck: No JVD, no thyromegaly or thyroid nodule.  Lungs: Clear to auscultation bilaterally with normal respiratory effort. CV: Nondisplaced PMI. Regular rate and rhythm, normal S1/S2, no S3/S4, no murmur.  No peripheral edema.  No carotid bruit.  Normal pedal pulses.  Abdomen: Soft, nontender, no hepatosplenomegaly, no distention.  Skin: Intact without lesions or rashes.  Neurologic: Alert and oriented x 3.  Psych: Normal affect. Extremities: No clubbing or cyanosis.  HEENT: Normal.   ECG: Most recent ECG reviewed.  Labs:   Lab Results  Component Value Date   WBC 12.9* 01/12/2014   HGB 14.4 01/12/2014   HCT 41.0 01/12/2014   MCV 87.0 01/12/2014   PLT 304 01/12/2014   No results for  input(s): NA, K, CL, CO2, BUN, CREATININE, CALCIUM, PROT, BILITOT, ALKPHOS, ALT, AST, GLUCOSE in the last 168 hours.  Invalid input(s): LABALBU Lab Results  Component Value Date   CKTOTAL 55 09/07/2006   CKMB 1.2 09/07/2006   TROPONINI 0.01        NO INDICATION OF MYOCARDIAL INJURY. 09/07/2006    Lab Results  Component Value Date   CHOL * 09/07/2006    271        ATP III CLASSIFICATION:  <200     mg/dL   Desirable  200-239  mg/dL   Borderline High  >=240    mg/dL   High   Lab Results  Component Value Date   HDL 31* 09/07/2006   Lab Results  Component Value Date   LDLCALC * 09/07/2006    180        Total Cholesterol/HDL:CHD Risk Coronary Heart Disease Risk Table                     Men   Women  1/2 Average Risk   3.4   3.3   Lab Results  Component Value Date   TRIG 300* 09/07/2006   Lab Results  Component Value Date   CHOLHDL 8.7 09/07/2006   No results found for: LDLDIRECT       Studies: No results found.  ASSESSMENT AND PLAN:  1. Chest pain: Symptoms are concerning for ischemic heart disease especially given her cardiovascular risk factors of insulin-dependent diabetes mellitus and hypertension. I will have her take aspirin 81 mg daily and will provide a prescription for sublingual nitroglycerin. I will obtain the cardiac catheterization report performed 10 years ago. I will arrange for exercise Cardiolite stress testing for further clarification.  2. Essential HTN: Controlled. No changes.  3. Hyperlipidemia: On statin therapy.  Dispo: f/u 3 weeks.   Signed: Kate Sable, M.D., F.A.C.C.  05/03/2015, 2:40 PM

## 2015-05-11 ENCOUNTER — Encounter (HOSPITAL_COMMUNITY)
Admission: RE | Admit: 2015-05-11 | Discharge: 2015-05-11 | Disposition: A | Discharge status: Home / Self Care | Payer: Medicare Other | Source: Ambulatory Visit | Attending: Cardiovascular Disease | Admitting: Cardiovascular Disease

## 2015-05-11 ENCOUNTER — Encounter (HOSPITAL_COMMUNITY): Payer: Self-pay

## 2015-05-11 ENCOUNTER — Inpatient Hospital Stay (HOSPITAL_COMMUNITY): Admission: RE | Admit: 2015-05-11 | Payer: Medicare Other | Source: Ambulatory Visit

## 2015-05-11 ENCOUNTER — Telehealth: Payer: Self-pay | Admitting: *Deleted

## 2015-05-11 DIAGNOSIS — R079 Chest pain, unspecified: Secondary | ICD-10-CM

## 2015-05-11 LAB — NM MYOCAR MULTI W/SPECT W/WALL MOTION / EF
Estimated workload: 4.6 METS
Exercise duration (min): 4 min
Exercise duration (sec): 33 s
LV dias vol: 61 mL (ref 46–106)
LV sys vol: 15 mL
MPHR: 150 {beats}/min
Peak HR: 131 {beats}/min
Percent HR: 87 %
RATE: 0.28
RPE: 15
Rest HR: 61 {beats}/min
SDS: 8
SRS: 0
SSS: 8
TID: 1.03

## 2015-05-11 MED ORDER — TECHNETIUM TC 99M SESTAMIBI - CARDIOLITE
30.0000 | Freq: Once | INTRAVENOUS | Status: AC | PRN
  Administered 2015-05-11: 11:00:00 30 via INTRAVENOUS

## 2015-05-11 MED ORDER — SODIUM CHLORIDE 0.9% FLUSH
INTRAVENOUS | Status: AC
  Administered 2015-05-11: 10 mL via INTRAVENOUS
  Filled 2015-05-11: qty 10

## 2015-05-11 MED ORDER — REGADENOSON 0.4 MG/5ML IV SOLN
INTRAVENOUS | Status: AC
  Filled 2015-05-11: qty 5

## 2015-05-11 MED ORDER — TECHNETIUM TC 99M SESTAMIBI GENERIC - CARDIOLITE
10.0000 | Freq: Once | INTRAVENOUS | Status: AC | PRN
  Administered 2015-05-11: 10 via INTRAVENOUS

## 2015-05-11 NOTE — Telephone Encounter (Signed)
-----   Message from Laqueta LindenSuresh A Koneswaran, MD sent at 05/11/2015  2:02 PM EST ----- Low risk.

## 2015-05-11 NOTE — Telephone Encounter (Signed)
Called patient with test results. No answer. Left message to call back.  

## 2015-05-17 ENCOUNTER — Ambulatory Visit
Admission: RE | Admit: 2015-05-17 | Discharge: 2015-05-17 | Disposition: A | Discharge status: Home / Self Care | Payer: Medicare Other | Source: Ambulatory Visit

## 2015-05-17 DIAGNOSIS — Z1231 Encounter for screening mammogram for malignant neoplasm of breast: Secondary | ICD-10-CM

## 2015-05-24 ENCOUNTER — Encounter: Payer: Self-pay | Admitting: Cardiovascular Disease

## 2015-05-24 ENCOUNTER — Ambulatory Visit (INDEPENDENT_AMBULATORY_CARE_PROVIDER_SITE_OTHER): Payer: Medicare Other | Admitting: Cardiovascular Disease

## 2015-05-24 VITALS — BP 148/88 | HR 77 | Ht 65.0 in | Wt 146.0 lb

## 2015-05-24 DIAGNOSIS — I10 Essential (primary) hypertension: Secondary | ICD-10-CM

## 2015-05-24 DIAGNOSIS — E785 Hyperlipidemia, unspecified: Secondary | ICD-10-CM | POA: Diagnosis not present

## 2015-05-24 DIAGNOSIS — R079 Chest pain, unspecified: Secondary | ICD-10-CM | POA: Diagnosis not present

## 2015-05-24 MED ORDER — AMLODIPINE BESYLATE 5 MG PO TABS: 5.0000 mg | ORAL_TABLET | Freq: Every day | ORAL | Status: DC

## 2015-05-24 NOTE — Progress Notes (Signed)
Patient ID: [name] CroftVirginia G Kruer, female   DOB: 12/05/1945, 70 y.o.   MRN: [id number]      SUBJECTIVE: The patient returns for follow-up after undergoing cardiovascular testing performed for the evaluation of chest pain.  Nuclear stress testing on 05/11/15 demonstrated a hypertensive response to exercise. There was some soft tissue attenuation artifact with no clear ischemic territories. It was deemed a low risk study, LVEF greater than 65%.  She had one episode of chest pain last week alleviated with one SL nitro tablet.  Review of Systems: As per "subjective", otherwise negative.  Allergies  Allergen Reactions  . Erythromycin Anaphylaxis  . Gadolinium Derivatives Hives and Rash    Pt broke out in hives all over body.  Has IV steroids and Benadryl in hospital for MRI.  Marland Kitchen. Iodinated Diagnostic Agents Hives    13-hour prep Pt had slight itching of her abd after her injection and had taken her 13 hr prep.  Consult with radiologist before another procedure to see if IV contrast is necessary, per Dr Roswell Nickelubner on 01/06/14, JB/  . Morphine Nausea And Vomiting  . Penicillins Anaphylaxis  . Prednisone Other (See Comments)    "shakes me out of my frame" IM steroids aren't as bad as PO  . Sulfonamide Derivatives Itching and Rash    All over body   . Aspirin Nausea Only and Other (See Comments)    Burning in stomach   . Latex Dermatitis and Other (See Comments)    blisters  . Other Itching and Other (See Comments)    Chlorhexin (CHG); "burning"  . Varenicline Tartrate Nausea And Vomiting         Current Outpatient Prescriptions  Medication Sig Dispense Refill  . aspirin EC 81 MG tablet Take 1 tablet (81 mg total) by mouth daily. 90 tablet 3  . buPROPion (ZYBAN) 150 MG 12 hr tablet Take 150 mg by mouth 2 (two) times daily.    Marland Kitchen. docusate sodium (COLACE) 100 MG capsule Take 100 mg by mouth daily.    . Insulin Glargine (TOUJEO SOLOSTAR) 300 UNIT/ML SOPN Inject 20-40 Units into the skin daily.  If Blood GLucose Lvl is Under 200 use 20units if lvl is  above 200 use 40 units;    . levothyroxine (SYNTHROID, LEVOTHROID) 112 MCG tablet Take 112 mcg by mouth daily.    . metoCLOPramide (REGLAN) 5 MG tablet Take 5 mg by mouth 2 (two) times daily.    . nitroGLYCERIN (NITROSTAT) 0.4 MG SL tablet Place 1 tablet (0.4 mg total) under the tongue every 5 (five) minutes x 3 doses as needed for chest pain. If no relief after 3rd dose, proceed to the ED for an evaluation 25 tablet 3  . pantoprazole (PROTONIX) 40 MG tablet Take 40 mg by mouth daily.    . sertraline (ZOLOFT) 100 MG tablet Take 50 mg by mouth daily.     . simvastatin (ZOCOR) 20 MG tablet Take 20 mg by mouth every evening.    . triamterene-hydrochlorothiazide (MAXZIDE-25) 37.5-25 MG per tablet Take 1 tablet by mouth daily.    Marland Kitchen. zolpidem (AMBIEN) 5 MG tablet Take 5 mg by mouth at bedtime. For sleep     No current facility-administered medications for this visit.    Past Medical History  Diagnosis Date  . Hypertension     takes Maxzide daily  . Hyperlipidemia     takes Simvastatin daily  . Smokers' cough (HCC)     has albuterol prn  . Peripheral neuropathy (  HCC)   . Fibromyalgia   . Lupus (HCC)   . Neck pain     herniated disc and radiculopathy  . Urinary frequency   . History of bladder infections     sees a urologist--on long term Macrodantin nightly  . History of blood transfusion     as a child  . Hypothyroidism     takes Synthroid daily  . Depression     takes Zoloft daily  . Insomnia     takes Ambien nightly  . Asthma   . COPD (chronic obstructive pulmonary disease) (HCC)   . Pneumonia 2012    "have had it a couple times" (01/24/2012)  . Chronic bronchitis (HCC)     "last time ~ 1 month ago" (01/24/2012)  . Type II diabetes mellitus (HCC)     takes Janumet daily  . History of blood transfusion     "when I was a child" (01/24/2012)  . Arthritis     "little bit qwhere" (01/24/2012)  . Chronic back pain      "neck to tailbone" (01/24/2012)    Past Surgical History  Procedure Laterality Date  . Appendectomy  1960's  . Bladder suspension  1974  . Bladder surgery  1977    through abdomen  . Rectocele repair  2000  . Back surgery  2001  . Lumbar fusion  2009  . Colonoscopy    . Esophagogastroduodenoscopy    . Cardiac catheterization  2008  . Anterior cervical decomp/discectomy fusion  2002; 01/24/2012  . Vaginal hysterectomy  1974  . Bunionectomy  1980's    left  . Anterior cervical decomp/discectomy fusion  01/24/2012    Procedure: ANTERIOR CERVICAL DECOMPRESSION/DISCECTOMY FUSION 1 LEVEL;  Surgeon: Cristi Loron, MD;  Location: [facility];  Service: Neurosurgery;  Laterality: N/A;  CERVICAL THREE-FOUR anterior cervical decompression with fusion interbody prothesis plating and bonegraft    Social History   Social History  . Marital Status: Married    Spouse Name: N/A  . Number of Children: N/A  . Years of Education: N/A   Occupational History  . Not on file.   Social History Main Topics  . Smoking status: Current Some Day Smoker -- 1.00 packs/day for 60 years    Types: Cigarettes  . Smokeless tobacco: Current User     Comment: vapor ciggs  . Alcohol Use: No  . Drug Use: No  . Sexual Activity: No   Other Topics Concern  . Not on file   Social History Narrative     Filed Vitals:   05/24/15 1446  BP: 148/88  Pulse: 77  Height:  (1.651 m)  Weight: 146 lb (66.225 kg)  SpO2: 96%    PHYSICAL EXAM General: NAD HEENT: Normal. Neck: No JVD, no thyromegaly. Lungs: Clear to auscultation bilaterally with normal respiratory effort. CV: Nondisplaced PMI.  Regular rate and rhythm, normal S1/S2, no S3/S4, no murmur. No pretibial or periankle edema.  No carotid bruit.   Abdomen: Soft, nontender, no distention.  Neurologic: Alert and oriented.  Psych: Normal affect. Skin: Normal. Musculoskeletal: No gross deformities.  ECG: Most recent ECG  reviewed.      ASSESSMENT AND PLAN: 1. Chest pain: Exercise Cardiolite stress test was low risk as noted above, with a hypertensive response to exercise. Will start amlodipine 5 mg for BP control and symptom relief.  2. Essential HTN: Elevated. Will start amlodipine 5 mg for BP control and symptom relief.  3. Hyperlipidemia: On statin therapy.  Dispo: f/u 6 months.   Kate Sable, M.D., F.A.C.C.

## 2015-05-24 NOTE — Patient Instructions (Addendum)
   Begin Amlodipine 5mg  daily - new sent to [facility] today. Continue all other medications.   Your physician wants you to follow up in: 6 months.  You will receive a reminder letter in the mail one-two months in advance.  If you don't receive a letter, please call our office to schedule the follow up appointment

## 2015-10-08 ENCOUNTER — Emergency Department (HOSPITAL_COMMUNITY)
Admission: EM | Admit: 2015-10-08 | Discharge: 2015-10-09 | Disposition: A | Discharge status: Home / Self Care | Payer: Medicare Other | Attending: Emergency Medicine | Admitting: Emergency Medicine

## 2015-10-08 ENCOUNTER — Encounter (HOSPITAL_COMMUNITY): Payer: Self-pay

## 2015-10-08 ENCOUNTER — Emergency Department (HOSPITAL_COMMUNITY): Payer: Medicare Other

## 2015-10-08 DIAGNOSIS — I10 Essential (primary) hypertension: Secondary | ICD-10-CM | POA: Diagnosis not present

## 2015-10-08 DIAGNOSIS — J45909 Unspecified asthma, uncomplicated: Secondary | ICD-10-CM | POA: Insufficient documentation

## 2015-10-08 DIAGNOSIS — Z79899 Other long term (current) drug therapy: Secondary | ICD-10-CM | POA: Diagnosis not present

## 2015-10-08 DIAGNOSIS — G9001 Carotid sinus syncope: Secondary | ICD-10-CM | POA: Insufficient documentation

## 2015-10-08 DIAGNOSIS — J449 Chronic obstructive pulmonary disease, unspecified: Secondary | ICD-10-CM | POA: Insufficient documentation

## 2015-10-08 DIAGNOSIS — E039 Hypothyroidism, unspecified: Secondary | ICD-10-CM | POA: Diagnosis not present

## 2015-10-08 DIAGNOSIS — E119 Type 2 diabetes mellitus without complications: Secondary | ICD-10-CM | POA: Diagnosis not present

## 2015-10-08 DIAGNOSIS — M542 Cervicalgia: Secondary | ICD-10-CM | POA: Diagnosis present

## 2015-10-08 DIAGNOSIS — F17210 Nicotine dependence, cigarettes, uncomplicated: Secondary | ICD-10-CM | POA: Insufficient documentation

## 2015-10-08 DIAGNOSIS — Z794 Long term (current) use of insulin: Secondary | ICD-10-CM | POA: Diagnosis not present

## 2015-10-08 DIAGNOSIS — Z7984 Long term (current) use of oral hypoglycemic drugs: Secondary | ICD-10-CM | POA: Diagnosis not present

## 2015-10-08 LAB — CBC WITH DIFFERENTIAL/PLATELET
Basophils Absolute: 0.1 10*3/uL (ref 0.0–0.1)
Basophils Relative: 1 %
Eosinophils Absolute: 0.5 10*3/uL (ref 0.0–0.7)
Eosinophils Relative: 4 %
HCT: 38.1 % (ref 36.0–46.0)
Hemoglobin: 12.8 g/dL (ref 12.0–15.0)
Lymphocytes Relative: 18 %
Lymphs Abs: 2.7 10*3/uL (ref 0.7–4.0)
MCH: 30 pg (ref 26.0–34.0)
MCHC: 33.6 g/dL (ref 30.0–36.0)
MCV: 89.2 fL (ref 78.0–100.0)
Monocytes Absolute: 0.8 10*3/uL (ref 0.1–1.0)
Monocytes Relative: 5 %
Neutro Abs: 10.7 10*3/uL — ABNORMAL HIGH (ref 1.7–7.7)
Neutrophils Relative %: 72 %
Platelets: 288 10*3/uL (ref 150–400)
RBC: 4.27 MIL/uL (ref 3.87–5.11)
RDW: 13.2 % (ref 11.5–15.5)
WBC: 14.8 10*3/uL — ABNORMAL HIGH (ref 4.0–10.5)

## 2015-10-08 LAB — URINALYSIS, ROUTINE W REFLEX MICROSCOPIC
Bilirubin Urine: NEGATIVE
Glucose, UA: NEGATIVE mg/dL
Hgb urine dipstick: NEGATIVE
Leukocytes, UA: NEGATIVE
Nitrite: NEGATIVE
Protein, ur: NEGATIVE mg/dL
Specific Gravity, Urine: 1.015 (ref 1.005–1.030)
pH: 7.5 (ref 5.0–8.0)

## 2015-10-08 LAB — BASIC METABOLIC PANEL
Anion gap: 8 (ref 5–15)
BUN: 14 mg/dL (ref 6–20)
CO2: 25 mmol/L (ref 22–32)
Calcium: 8.5 mg/dL — ABNORMAL LOW (ref 8.9–10.3)
Chloride: 101 mmol/L (ref 101–111)
Creatinine, Ser: 0.82 mg/dL (ref 0.44–1.00)
GFR calc Af Amer: >60 mL/min (ref >60)
GFR calc non Af Amer: >60 mL/min (ref >60)
Glucose, Bld: 129 mg/dL — ABNORMAL HIGH (ref 65–99)
Potassium: 3.7 mmol/L (ref 3.5–5.1)
Sodium: 134 mmol/L — ABNORMAL LOW (ref 135–145)

## 2015-10-08 MED ORDER — OXYCODONE-ACETAMINOPHEN 5-325 MG PO TABS
1.0000 | ORAL_TABLET | Freq: Once | ORAL | Status: AC
  Administered 2015-10-08: 1 via ORAL
  Filled 2015-10-08: qty 1

## 2015-10-08 MED ORDER — HYDROCORTISONE NA SUCCINATE PF 100 MG IJ SOLR
200.0000 mg | Freq: Once | INTRAMUSCULAR | Status: AC
  Administered 2015-10-08: 200 mg via INTRAVENOUS
  Filled 2015-10-08: qty 4

## 2015-10-08 MED ORDER — DIPHENHYDRAMINE HCL 25 MG PO CAPS
50.0000 mg | ORAL_CAPSULE | Freq: Once | ORAL | Status: AC
  Administered 2015-10-08: 50 mg via ORAL
  Filled 2015-10-08: qty 2

## 2015-10-08 MED ORDER — IOPAMIDOL (ISOVUE-300) INJECTION 61%
100.0000 mL | Freq: Once | INTRAVENOUS | Status: AC | PRN
  Administered 2015-10-08: 100 mL via INTRAVENOUS

## 2015-10-08 NOTE — Discharge Instructions (Signed)
There is ill defined opacity next to your carotid artery that could be causing your pain. It is unclear what it is. You will need to follow-up with your primary care doctor and ENT doctor (listed above).  Return for worsening symptoms, including difficulty breathing, voice changes, swelling of your throat, escalating pain, fevers or any other symptoms concerning to you.   This your CT neck results to review with your PCP.  EXAM: CT NECK WITH CONTRAST   TECHNIQUE: Multidetector CT imaging of the neck was performed using the standard protocol following the bolus administration of intravenous contrast.   CONTRAST:  ISOVUE-300 IOPAMIDOL (ISOVUE-300) INJECTION 61%   COMPARISON:  Cervical spine radiograph March 25, 2012   FINDINGS: Pharynx and larynx: Debris partially effacing the bilateral vallecula. Aerodigestive tract otherwise unremarkable. Preservation of parapharyngeal fat tissue planes.   Salivary glands: Normal.   Thyroid: Normal.   Lymph nodes: No definite lymphadenopathy by CT size criteria.   Vascular: Ill-defined intermediate density measuring approximately 15 x 8 mm within RIGHT carotid space, predominately posterior to the distal Common carotid artery and carotid bulb.   Limited intracranial: Normal. Mild calcific atherosclerosis of the carotid siphons.   Visualized orbits: Status post bilateral ocular lens implants.   Mastoids and visualized paranasal sinuses: Mild sphenoid sinus mucosal thickening. Mastoid air cells are well aerated.   Skeleton: Straightened cervical lordosis. Status post C3-4 ACDF. C3 through C7 arthrodesis. Osteopenia. Absent maxillary teeth.   Upper chest: Mild apical bullous changes. Mild centrilobular emphysema. No superior mediastinal lymphadenopathy.   IMPRESSION: Ill-defined soft tissue density within RIGHT carotid space associated with acute idiopathic carotidynia, less likely lymph nodes or mass.

## 2015-10-08 NOTE — ED Triage Notes (Signed)
Patient reports of currently taking anti-biotcs for tick bites. States Tuesday she started with neck pain and fever. Also reports of n/v/d.

## 2015-10-08 NOTE — ED Provider Notes (Signed)
[facility] Provider Note   CSN: [id number] Arrival date & time: 10/08/15  1721  First Provider Contact:  First MD Initiated Contact with Patient 10/08/15 1732        History   Chief Complaint Chief Complaint  Patient presents with  . Neck Pain  . Fever    HPI [name] is a 70 y.o. female.  HPI 70 year old female who presents with neck pain and low grade fever. She has history of diabetes, hypertension, hyperlipidemia, COPD. States that 1 week ago was started on doxycycline and ciprofloxacin for empiric treatment of Lyme disease by her PCP is she sustained tick bites 6-7 weeks ago. Initially she has been in her usual state of health. 4 days ago began to have intermittent right sided jaw pain as well as pain in the anterior left neck. Has had mild sore throat with swallowing, but no changes in her voice, difficulty breathing, difficulty swallowing saliva. Has not had significant headache or vision changes, cough, congestion, runny nose, nausea, vomiting, diarrhea, abdominal pain, chest pain, or urinary complaints. States that she had a temperature of almost 77 Fahrenheit which she considers is having a fever for herself. Pain worse with palpation of her anterior right neck and with bending over. Not associated with exertion. Past Medical History:  Diagnosis Date  . Arthritis    "little bit qwhere" (01/24/2012)  . Asthma   . Chronic back pain    "neck to tailbone" (01/24/2012)  . Chronic bronchitis (HCC)    "last time ~ 1 month ago" (01/24/2012)  . COPD (chronic obstructive pulmonary disease) (HCC)   . Depression    takes Zoloft daily  . Fibromyalgia   . History of bladder infections    sees a urologist--on long term Macrodantin nightly  . History of blood transfusion    as a child  . History of blood transfusion    "when I was a child" (01/24/2012)  . Hyperlipidemia    takes Simvastatin daily  . Hypertension    takes Maxzide daily  . Hypothyroidism    takes Synthroid daily  . Insomnia    takes Ambien nightly  . Lupus (HCC)   . Neck pain    herniated disc and radiculopathy  . Peripheral neuropathy (HCC)   . Pneumonia 2012   "have had it a couple times" (01/24/2012)  . Smokers' cough (HCC)    has albuterol prn  . Type II diabetes mellitus (HCC)    takes Janumet daily  . Urinary frequency     Patient Active Problem List   Diagnosis Date Noted  . Leg pain, left 07/27/2014  . Cervical spondylosis with radiculopathy 01/24/2012  . EMPHYSEMA 08/05/2008  . LEUKOCYTOSIS 06/25/2008  . COPD 06/25/2008  . GASTROPARESIS 06/25/2008  . DM 06/14/2008  . HYPERLIPIDEMIA 06/14/2008  . Essential hypertension 06/14/2008  . ASTHMATIC BRONCHITIS, ACUTE 06/14/2008  . ALLERGIC RHINITIS 06/14/2008  . LUPUS 06/14/2008    Past Surgical History:  Procedure Laterality Date  . ANTERIOR CERVICAL DECOMP/DISCECTOMY FUSION  2002; 01/24/2012  . ANTERIOR CERVICAL DECOMP/DISCECTOMY FUSION  01/24/2012   Procedure: ANTERIOR CERVICAL DECOMPRESSION/DISCECTOMY FUSION 1 LEVEL;  Surgeon: Cristi Loron, MD;  Location: [facility];  Service: Neurosurgery;  Laterality: N/A;  CERVICAL THREE-FOUR anterior cervical decompression with fusion interbody prothesis plating and bonegraft  . APPENDECTOMY  1960's  . BACK SURGERY  2001  . BLADDER SURGERY  1977   through abdomen  . BLADDER SUSPENSION  1974  . BUNIONECTOMY  1980's   left  . CARDIAC CATHETERIZATION  2008  . COLONOSCOPY    . ESOPHAGOGASTRODUODENOSCOPY    . LUMBAR FUSION  2009  . RECTOCELE REPAIR  2000  . VAGINAL HYSTERECTOMY  1974    OB History    No data available       Home Medications    Prior to Admission medications   Medication Sig Start Date End Date Taking? Authorizing Provider  ALPRAZolam Prudy Feeler) 0.25 MG tablet Take 0.25 mg by mouth daily as needed for anxiety.  09/14/15  Yes Historical Provider, MD  amLODipine (NORVASC) 5 MG tablet Take 1 tablet (5 mg total) by mouth daily. 05/24/15   Yes Laqueta Linden, MD  atorvastatin (LIPITOR) 20 MG tablet Take 20 mg by mouth every morning.  09/14/15  Yes Historical Provider, MD  B Complex-Biotin-FA (B-COMPLEX PO) Take 1 tablet by mouth daily.   Yes Historical Provider, MD  ciprofloxacin (CIPRO) 500 MG tablet Take 500 mg by mouth 2 (two) times daily. 10 day course starting on 10/04/2015 10/04/15  Yes Historical Provider, MD  docusate sodium (COLACE) 100 MG capsule Take 100 mg by mouth daily.   Yes Historical Provider, MD  Insulin Glargine (TOUJEO SOLOSTAR) 300 UNIT/ML SOPN Inject 20-40 Units into the skin daily. If Blood GLucose Lvl is Under 200 use 20units if lvl is  above 200 use 40 units;   Yes Historical Provider, MD  levothyroxine (SYNTHROID, LEVOTHROID) 112 MCG tablet Take 112 mcg by mouth daily.   Yes Historical Provider, MD  Magnesium Hydroxide (MAGNESIA PO) Take 1 tablet by mouth daily.   Yes Historical Provider, MD  metFORMIN (GLUCOPHAGE) 1000 MG tablet Take 1,000 mg by mouth daily.  09/14/15  Yes Historical Provider, MD  metoCLOPramide (REGLAN) 5 MG tablet Take 5 mg by mouth 2 (two) times daily.   Yes Historical Provider, MD  nitroGLYCERIN (NITROSTAT) 0.4 MG SL tablet Place 1 tablet (0.4 mg total) under the tongue every 5 (five) minutes x 3 doses as needed for chest pain. If no relief after 3rd dose, proceed to the ED for an evaluation 05/03/15  Yes Laqueta Linden, MD  NON FORMULARY Take 1 capsule by mouth daily. Supplied by Andi Devon, MD Thyroid supplement in addition to Synthroid taken daily   Yes Historical Provider, MD  oxyCODONE-acetaminophen (PERCOCET) 10-325 MG tablet Take 0.5-1 tablets by mouth every 4 (four) hours as needed for pain.   Yes Historical Provider, MD  pantoprazole (PROTONIX) 40 MG tablet Take 40 mg by mouth daily.   Yes Historical Provider, MD  sertraline (ZOLOFT) 100 MG tablet Take 100 mg by mouth daily.    Yes Historical Provider, MD  simvastatin (ZOCOR) 20 MG tablet Take 20 mg by mouth every  evening.   Yes Historical Provider, MD  traMADol (ULTRAM) 50 MG tablet Take 50 mg by mouth every 6 (six) hours as needed for moderate pain or severe pain.  09/14/15  Yes Historical Provider, MD  triamterene-hydrochlorothiazide (MAXZIDE-25) 37.5-25 MG per tablet Take 1 tablet by mouth daily.   Yes Historical Provider, MD  zolpidem (AMBIEN) 5 MG tablet Take 5 mg by mouth at bedtime. For sleep   Yes Historical Provider, MD  doxycycline (VIBRAMYCIN) 100 MG capsule Take 100 mg by mouth 2 (two) times daily. COMPLETED COURSE 10/07/2015 09/28/15   Historical Provider, MD    Family History Family History  Problem Relation Age of Onset  . Heart disease Mother   . Arrhythmia Father   . Heart disease Sister   .  Heart attack Brother   . Diabetes Brother     Social History Social History  Substance Use Topics  . Smoking status: Current Some Day Smoker    Packs/day: 1.00    Years: 60.00    Types: Cigarettes  . Smokeless tobacco: Current User     Comment: vapor ciggs  . Alcohol use No     Allergies   Erythromycin; Gadolinium derivatives; Iodinated diagnostic agents; Morphine; Penicillins; Prednisone; Sulfonamide derivatives; Aspirin; Latex; Other; and Varenicline tartrate   Review of Systems Review of Systems 10/14 systems reviewed and are negative other than those stated in the HPI   Physical Exam Updated Vital Signs BP 101/63 (BP Location: Left Arm)   Pulse (!) 53   Temp 98.8 F (37.1 C) (Oral)   Resp 17   Ht 5\' 5"  (1.651 m)   Wt 140 lb (63.5 kg)   SpO2 95%   BMI 23.30 kg/m   Physical Exam Physical Exam  Nursing note and vitals reviewed. Constitutional: Well developed, well nourished, non-toxic, and in no acute distress Head: Normocephalic and atraumatic.  Mouth/Throat: Oropharynx is clear and moist.  Neck: Normal range of motion. Neck supple. No major LND. Mild pain with neck extension. No meningismus. Ears: Bilateral TM normal Cardiovascular: Normal rate and regular  rhythm.   Pulmonary/Chest: Effort normal and breath sounds normal.  Abdominal: Soft. There is no tenderness. There is no rebound and no guarding.  Musculoskeletal: Normal range of motion.  Neurological: Alert, no facial droop, fluent speech, moves all extremities symmetrically Skin: Skin is warm and dry.  Psychiatric: Cooperative   ED Treatments / Results  Labs (all labs ordered are listed, but only abnormal results are displayed) Labs Reviewed  CBC WITH DIFFERENTIAL/PLATELET - Abnormal; Notable for the following:       Result Value   WBC 14.8 (*)    Neutro Abs 10.7 (*)    All other components within normal limits  BASIC METABOLIC PANEL - Abnormal; Notable for the following:    Sodium 134 (*)    Glucose, Bld 129 (*)    Calcium 8.5 (*)    All other components within normal limits  URINALYSIS, ROUTINE W REFLEX MICROSCOPIC (NOT AT [facility]) - Abnormal; Notable for the following:    Ketones, ur TRACE (*)    All other components within normal limits    EKG  EKG Interpretation  Date/Time:  Saturday October 08 2015 18:17:57 EDT Ventricular Rate:  57 PR Interval:    QRS Duration: 94 QT Interval:  459 QTC Calculation: 447 R Axis:   98 Text Interpretation:  Sinus rhythm Right axis deviation No acute changes  Confirmed by Nickia Boesen MD, Greysen Swanton [phone]) on 10/08/2015 6:54:02 PM       Radiology Ct Soft Tissue Neck W Contrast  Result Date: 10/08/2015 CLINICAL DATA:  Intermittent RIGHT jaw pain, anterior LEFT neck pain. Sore throat with swallowing for 4 days. Low-grade fever. Tick bite 2 months ago, on antibiotics. History of hypertension, diabetes. EXAM: CT NECK WITH CONTRAST TECHNIQUE: Multidetector CT imaging of the neck was performed using the standard protocol following the bolus administration of intravenous contrast. CONTRAST:  ISOVUE-300 IOPAMIDOL (ISOVUE-300) INJECTION 61% COMPARISON:  Cervical spine radiograph March 25, 2012 FINDINGS: Pharynx and larynx: Debris partially effacing the  bilateral vallecula. Aerodigestive tract otherwise unremarkable. Preservation of parapharyngeal fat tissue planes. Salivary glands: Normal. Thyroid: Normal. Lymph nodes: No definite lymphadenopathy by CT size criteria. Vascular: Ill-defined intermediate density measuring approximately 15 x 8 mm within RIGHT carotid  space, predominately posterior to the distal Common carotid artery and carotid bulb. Limited intracranial: Normal. Mild calcific atherosclerosis of the carotid siphons. Visualized orbits: Status post bilateral ocular lens implants. Mastoids and visualized paranasal sinuses: Mild sphenoid sinus mucosal thickening. Mastoid air cells are well aerated. Skeleton: Straightened cervical lordosis. Status post C3-4 ACDF. C3 through C7 arthrodesis. Osteopenia. Absent maxillary teeth. Upper chest: Mild apical bullous changes. Mild centrilobular emphysema. No superior mediastinal lymphadenopathy. IMPRESSION: Ill-defined soft tissue density within RIGHT carotid space associated with acute idiopathic carotidynia, less likely lymph nodes or mass. Electronically Signed   By: Awilda Metro M.D.   On: 10/08/2015 23:15    Procedures Procedures (including critical care time)  Medications Ordered in ED Medications  oxyCODONE-acetaminophen (PERCOCET/ROXICET) 5-325 MG per tablet 1 tablet (1 tablet Oral Given 10/08/15 1753)  hydrocortisone sodium succinate (SOLU-CORTEF) 100 MG injection 200 mg (200 mg Intravenous Given 10/08/15 1853)  diphenhydrAMINE (BENADRYL) capsule 50 mg (50 mg Oral Given 10/08/15 2159)  iopamidol (ISOVUE-300) 61 % injection 100 mL (100 mLs Intravenous Contrast Given 10/08/15 2248)     Initial Impression / Assessment and Plan / ED Course  I have reviewed the triage vital signs and the nursing notes.  Pertinent labs & imaging results that were available during my care of the patient were reviewed by me and considered in my medical decision making (see chart for details).  Clinical Course    70 year old female who presents with 4 days of anterior right neck pain with mild sore throat. On presentation is nontoxic in no acute distress. Is afebrile with normal vital signs. Oropharynx is clear. She has normal normal range of motion of her neck. No palpable masses or neck swelling. No evidence of any airway compromise. Did obtain CT soft tissue of the neck to look for potential deep space neck infection. This is visualized and reviewed by radiology. No evidence of infection or mass. They do comment on ill-defined opacity behind the carotid bulb associated with acute idiopathic carotidynia. This seems to be the location of where her pain is. Unclear significance of this. Is given ENT follow-up as well as close follow-up with PCP. I feel that she is stable for continued outpatient management. Strict return and follow-up instructions reviewed. She expressed understanding of all discharge instructions and felt comfortable with the plan of care.  The patient appears reasonably screened and/or stabilized for discharge and I doubt any other medical condition or other [facility] requiring further screening, evaluation, or treatment in the ED at this time prior to discharge.   Final Clinical Impressions(s) / ED Diagnoses   Final diagnoses:  Carotidynia    New Prescriptions New Prescriptions   No medications on file     Lavera Guise, MD 10/08/15 2341

## 2015-10-08 NOTE — ED Notes (Signed)
Patient transported to CT by Chrissie Noa.

## 2015-10-26 DIAGNOSIS — G9001 Carotid sinus syncope: Secondary | ICD-10-CM | POA: Insufficient documentation

## 2015-11-04 ENCOUNTER — Other Ambulatory Visit: Payer: Self-pay | Admitting: Otolaryngology

## 2015-11-04 ENCOUNTER — Telehealth: Payer: Self-pay

## 2015-11-04 DIAGNOSIS — G9001 Carotid sinus syncope: Secondary | ICD-10-CM

## 2015-11-04 NOTE — Telephone Encounter (Signed)
Left message on patient's phone that [facility] 13-hour [facility] [phone]). They have the dosing instructions but I left our number for her to call with questions/clarifications.  Donell SievertJeanne Aleayah [place], RN

## 2015-11-09 ENCOUNTER — Ambulatory Visit
Admission: RE | Admit: 2015-11-09 | Discharge: 2015-11-09 | Disposition: A | Discharge status: Home / Self Care | Payer: Medicare Other | Source: Ambulatory Visit | Attending: Otolaryngology | Admitting: Otolaryngology

## 2015-11-09 DIAGNOSIS — G9001 Carotid sinus syncope: Secondary | ICD-10-CM

## 2015-11-09 MED ORDER — IOPAMIDOL (ISOVUE-300) INJECTION 61%
75.0000 mL | Freq: Once | INTRAVENOUS | Status: AC | PRN
  Administered 2015-11-09: 75 mL via INTRAVENOUS

## 2015-11-12 ENCOUNTER — Other Ambulatory Visit: Payer: Self-pay | Admitting: Cardiovascular Disease

## 2015-12-30 ENCOUNTER — Encounter: Payer: Self-pay | Admitting: Surgery

## 2016-01-02 ENCOUNTER — Ambulatory Visit (INDEPENDENT_AMBULATORY_CARE_PROVIDER_SITE_OTHER): Payer: Medicare Other | Admitting: Surgery

## 2016-01-02 ENCOUNTER — Encounter: Payer: Self-pay | Admitting: Surgery

## 2016-01-02 VITALS — BP 122/70 | HR 90 | Temp 97.1°F | Resp 16 | Ht 65.0 in | Wt 135.0 lb

## 2016-01-02 DIAGNOSIS — G9001 Carotid sinus syncope: Secondary | ICD-10-CM

## 2016-01-02 NOTE — Progress Notes (Signed)
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 female who presents today with complaints of 4 months of neck pain that started on the right side.  She states that it was unbearable.  Described it like an "earache" and unable to bend over.  It got so bad that she asked her husband to take her to the ER.  She underwent a CT scan at that time that she states revealed a small mass.  She was referred to Dr. Jenne PaneBates (ENT).  This was described as inflammation and she was given two weeks of solumedrol.  She returned for a repeat CT scan, which revealed very little change and now with a mass on the left side.   She was placed on Toradol shots daily x 7 days as well as 2 weeks of prednisone.  At about the 11th day of the prednisone, she developed shingles around the left breast onto her back.  She did not get any relief from this.  She states that she has had about a 10-15lb weight loss, she has no energy, she is having severe night sweats, she has trembles/shakes and overall does not feel well.  She does not have any appetite and forces herself to eat.  She does have gastroparesis.  The only neck trauma she has had is two spinal fusions with the first being in 2002 and the 2nd one in 2014 where they went through the neck.   She states that she is disappointed that she will not be able to do Thanksgiving dinner for the 1st time in 47 years as she just does not feel good.    She does smoke cigarettes daily.  She is on a statin for cholesterol management.  She is on insulin and Metformin for diabetes management.  She is on a CCB & diuretic for blood pressure management.    Past Medical History:  Diagnosis Date  . Arthritis    "little bit qwhere" (01/24/2012)  . Asthma   . Chronic back pain    "neck to tailbone" (01/24/2012)  . Chronic bronchitis (HCC)    "last time ~ 1 month ago" (01/24/2012)  . COPD (chronic  obstructive pulmonary disease) (HCC)   . Depression    takes Zoloft daily  . Fibromyalgia   . History of bladder infections    sees a urologist--on long term Macrodantin nightly  . History of blood transfusion    as a child  . History of blood transfusion    "when I was a child" (01/24/2012)  . Hyperlipidemia    takes Simvastatin daily  . Hypertension    takes Maxzide daily  . Hypothyroidism    takes Synthroid daily  . Insomnia    takes Ambien nightly  . Lupus   . Neck pain    herniated disc and radiculopathy  . Peripheral neuropathy (HCC)   . Pneumonia 2012   "have had it a couple times" (01/24/2012)  . Smokers' cough (HCC)    has albuterol prn  . Type II diabetes mellitus (HCC)    takes Janumet daily  . Urinary frequency     Past Surgical History:  Procedure Laterality Date  . ANTERIOR CERVICAL DECOMP/DISCECTOMY FUSION  2002; 01/24/2012  . ANTERIOR CERVICAL DECOMP/DISCECTOMY FUSION  01/24/2012   Procedure: ANTERIOR CERVICAL DECOMPRESSION/DISCECTOMY FUSION 1 LEVEL;  Surgeon: Cristi LoronJeffrey D Jenkins, MD;  Location: [facility];  Service:  Neurosurgery;  Laterality: N/A;  CERVICAL THREE-FOUR anterior cervical decompression with fusion interbody prothesis plating and bonegraft  . APPENDECTOMY  1960's  . BACK SURGERY  2001  . BLADDER SURGERY  1977   through abdomen  . BLADDER SUSPENSION  1974  . BUNIONECTOMY  1980's   left  . CARDIAC CATHETERIZATION  2008  . COLONOSCOPY    . ESOPHAGOGASTRODUODENOSCOPY    . LUMBAR FUSION  2009  . RECTOCELE REPAIR  2000  . VAGINAL HYSTERECTOMY  1974    Allergies  Allergen Reactions  . Erythromycin Anaphylaxis  . Penicillins Anaphylaxis  . Gadolinium Derivatives Hives and Rash    Pt broke out in hives all over body.  Has IV steroids and Benadryl in hospital for MRI.  Marland Kitchen Iodinated Diagnostic Agents Hives    13-hour prep Pt had slight itching of her abd after her injection and had taken her 13 hr prep.  Consult with radiologist before  another procedure to see if IV contrast is necessary, per Dr Roswell Nickel on 01/06/14, JB/  . Latex Dermatitis and Other (See Comments)    blisters  . Morphine Nausea And Vomiting  . Other Itching and Other (See Comments)    Chlorhexin (CHG); "burning"  . Prednisone Other (See Comments)    "shakes me out of my frame" IM steroids aren't as bad as PO  . Sulfonamide Derivatives Itching and Rash    All over body   . Varenicline Tartrate Nausea And Vomiting       . Aspirin Nausea Only and Other (See Comments)    Burning in stomach     Current Outpatient Prescriptions  Medication Sig Dispense Refill  . ALPRAZolam (XANAX) 0.25 MG tablet Take 0.25 mg by mouth daily as needed for anxiety.     Marland Kitchen amLODipine (NORVASC) 5 MG tablet TAKE ONE (1) TABLET EACH DAY 30 tablet 1  . atorvastatin (LIPITOR) 20 MG tablet Take 20 mg by mouth every morning.     . B Complex-Biotin-FA (B-COMPLEX PO) Take 1 tablet by mouth daily.    Marland Kitchen docusate sodium (COLACE) 100 MG capsule Take 100 mg by mouth daily.    . Insulin Glargine (TOUJEO SOLOSTAR) 300 UNIT/ML SOPN Inject 20-40 Units into the skin daily. If Blood GLucose Lvl is Under 200 use 20units if lvl is  above 200 use 40 units;    . levothyroxine (SYNTHROID, LEVOTHROID) 112 MCG tablet Take 112 mcg by mouth daily.    . Magnesium Hydroxide (MAGNESIA PO) Take 1 tablet by mouth daily.    . metFORMIN (GLUCOPHAGE) 1000 MG tablet Take 1,000 mg by mouth daily.     . metoCLOPramide (REGLAN) 5 MG tablet Take 5 mg by mouth 2 (two) times daily.    . nitroGLYCERIN (NITROSTAT) 0.4 MG SL tablet Place 1 tablet (0.4 mg total) under the tongue every 5 (five) minutes x 3 doses as needed for chest pain. If no relief after 3rd dose, proceed to the ED for an evaluation 25 tablet 3  . NON FORMULARY Take 1 capsule by mouth daily. Supplied by Andi Devon, MD Thyroid supplement in addition to Synthroid taken daily    . pantoprazole (PROTONIX) 40 MG tablet Take 40 mg by mouth daily.    .  sertraline (ZOLOFT) 100 MG tablet Take 100 mg by mouth daily.     Marland Kitchen triamterene-hydrochlorothiazide (MAXZIDE-25) 37.5-25 MG per tablet Take 1 tablet by mouth daily.    Marland Kitchen zolpidem (AMBIEN) 5 MG tablet Take 5 mg by mouth at bedtime.  For sleep     No current facility-administered medications for this visit.     Family History  Problem Relation Age of Onset  . Heart disease Mother   . Arrhythmia Father   . Heart disease Sister   . Heart attack Brother   . Diabetes Brother     Social History   Social History  . Marital status: Married    Spouse name: N/A  . Number of children: N/A  . Years of education: N/A   Occupational History  . Not on file.   Social History Main Topics  . Smoking status: Current Some Day Smoker    Packs/day: 0.50    Years: 60.00    Types: Cigarettes  . Smokeless tobacco: Current User     Comment: vapor ciggs  . Alcohol use No  . Drug use: No  . Sexual activity: No   Other Topics Concern  . Not on file   Social History Narrative  . No narrative on file     REVIEW OF SYSTEMS:   [X]  denotes positive finding, [ ]  denotes negative finding Cardiac  Comments:  Chest pain or chest pressure:    Shortness of breath upon exertion:    Short of breath when lying flat:    Irregular heart rhythm:        Vascular    Pain in calf, thigh, or hip brought on by ambulation:    Pain in feet at night that wakes you up from your sleep:     Blood clot in your veins:    Leg swelling:         Pulmonary    Oxygen at home:    Productive cough:     Wheezing:         Neurologic    Sudden weakness in arms or legs:     Sudden numbness in arms or legs:     Sudden onset of difficulty speaking or slurred speech:    Temporary loss of vision in one eye:     Problems with dizziness:         Gastrointestinal    Blood in stool:     Vomited blood:         Genitourinary    Burning when urinating:     Blood in urine:        Psychiatric    Major depression:          Hematologic    Bleeding problems:    Problems with blood clotting too easily:        Skin    Rashes or ulcers:        Constitutional    Fever or chills:      PHYSICAL EXAMINATION:  Vitals:   01/02/16 1445 01/02/16 1448  BP: 124/82 122/70  Pulse: 90 90  Resp:  16  Temp:  97.1 F (36.2 C)   Body mass index is 22.47 kg/m.  General:  WDWN in NAD; vital signs documented above Gait: Normal HENT: WNL, normocephalic; well healed incisions Pulmonary: normal non-labored breathing Skin: without rashes Vascular Exam/Pulses: Difficult to appreciate, but may have slight fullness of the left neck. Extremities: without ischemic changes, without Gangrene , without cellulitis; without open wounds;  Musculoskeletal: no muscle wasting or atrophy  Neurologic: A&O X 3;  No focal weakness or paresthesias are detected Psychiatric:  The pt has Normal affect.   Non-Invasive Vascular Imaging:   Soft tissue neck with contrast 10/08/15: MPRESSION: Ill-defined soft tissue density  within RIGHT carotid space associated with acute idiopathic carotidynia, less likely lymph nodes or mass.  Soft tissue neck with contrast 11/09/15: IMPRESSION: 1. Residual soft tissue prominence about the right carotid artery may be slightly improved since the prior exam. 2. There is now mild soft tissue swelling about the left carotid artery as well. 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 No discrete mass lesion or adenopathy is evident. 3. Stable fusion of the cervical spine. 4. Atherosclerosis.   Pt meds includes: Statin:  Yes.   Beta Blocker:  No. Aspirin:  No. ACEI:  No. ARB:  No. Other Antiplatelet/Anticoagulant:  No.    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  She has failed NSAID's and steroids and subsequently developed shingles.   -she could have some sort of vasculitis  given her constitutional sx -she does not have a surgical issue and Dr. Myra GianottiBrabham feels she should be referred to Rhuematology for further workup.  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  -Dr. Myra GianottiBrabham will see the pt back in 6 months with a carotid duplex   Doreatha MassedSamantha Rhyne, PA-C [facility] [phone]  Clinic MD:  Pt seen and examined in conjunction with Dr. Myra GianottiBrabham   I agree with the above.  I have seen and evaluated the patient as well as reviewed her CT scans.  She has been diagnosed with carotidynia.  I do not feel that she has significant carotid stenosis.  I would not recommend surgical treatment of this disease process.  She has undergone H Rall of treatment with Toradol as well as steroids.  Unfortunately she developed shingles and had to stop the steroids.  She is having constitutional symptoms such as night sweats and low-grade fevers.  She appears to have a systemic component.  I wonder if this is on the spectrum of a vasculitis.  The initial treatment would be steroids which she could not tolerate because of her shingles outbreak.  I think she would benefit from a referral to rheumatology for further testing and treatment options.  She has seen rheumatology in the past for her discoid lupus.  I would like her to be evaluated within the next couple weeks.  I do have her scheduled to return to see me in 6 months  for a carotid duplex to make sure that she has not had progression of carotid stenosis

## 2016-01-11 ENCOUNTER — Other Ambulatory Visit: Payer: Self-pay | Admitting: Cardiovascular Disease

## 2016-01-17 NOTE — Addendum Note (Signed)
Addended by: Melodye PedMANESS-HARRISON, Amairany Schumpert C on: 01/17/2016 03:27 PM   Modules accepted: Orders

## 2016-03-23 ENCOUNTER — Other Ambulatory Visit: Payer: Self-pay | Admitting: Neurosurgery

## 2016-03-23 DIAGNOSIS — M5416 Radiculopathy, lumbar region: Secondary | ICD-10-CM

## 2016-03-27 ENCOUNTER — Telehealth: Payer: Self-pay

## 2016-03-27 NOTE — Telephone Encounter (Signed)
Phone call from pt.  Stated her Rheumatologist sent Dr. Myra GianottiBrabham a letter with his evaluation results; questioned if letter was rec'd?  Stated Dr. Dierdre ForthBeekman and his PA recommended to biopsy the carotid growths, to further evaluate for Vasculitis.  Reported she had an episode on Sunday morning with "feeling light-headed, poor balance, sensation that the right side of mouth was pulling, and of pain radiating on both sides of neck, up into head, above the temple region."  Denied any unilateral weakness of extremities, or visual disturbance.  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  Pt. C/o that the areas in her neck "just hurt."  Asking if she needs to come back to see Dr. Myra GianottiBrabham sooner than the recommended f/u in 6 mos.  Verb. being frightened by what is going on in her neck.  Advised will discuss with Dr. Myra GianottiBrabham, and call pt. back 1/24.  Agreed.

## 2016-03-28 NOTE — Telephone Encounter (Signed)
Discussed pt's report from Rheumatologist with Dr. Myra GianottiBrabham.  He advised to schedule an office appt.; no studies needed at this time.  Will have Scheduler contact pt. with appt. Info.

## 2016-03-28 NOTE — Telephone Encounter (Signed)
spoke to pt about appt, will mail letter 2/5

## 2016-04-02 ENCOUNTER — Encounter: Payer: Self-pay | Admitting: Surgery

## 2016-04-02 ENCOUNTER — Ambulatory Visit
Admission: RE | Admit: 2016-04-02 | Discharge: 2016-04-02 | Disposition: A | Discharge status: Home / Self Care | Payer: Medicare Other | Source: Ambulatory Visit | Attending: Neurosurgery | Admitting: Neurosurgery

## 2016-04-02 DIAGNOSIS — M5416 Radiculopathy, lumbar region: Secondary | ICD-10-CM

## 2016-04-09 ENCOUNTER — Telehealth: Payer: Self-pay

## 2016-04-09 ENCOUNTER — Telehealth: Payer: Self-pay | Admitting: Surgery

## 2016-04-09 ENCOUNTER — Encounter: Payer: Self-pay | Admitting: Surgery

## 2016-04-09 ENCOUNTER — Ambulatory Visit (INDEPENDENT_AMBULATORY_CARE_PROVIDER_SITE_OTHER): Payer: Medicare Other | Admitting: Surgery

## 2016-04-09 VITALS — BP 111/73 | HR 71 | Temp 97.0°F | Resp 16 | Ht 65.0 in | Wt 132.0 lb

## 2016-04-09 DIAGNOSIS — G9001 Carotid sinus syncope: Secondary | ICD-10-CM

## 2016-04-09 NOTE — Telephone Encounter (Signed)
I called patient to inform her of the appointments [facility] requested. She is scheduled for the CTA Neck at the [address] location of [facility] on 04/11/16 at 11:40am. She knows to arrive at 11:20am.  And she also has picked up the 13 hour prep for the CTA at her pharmacy. She is to see [facility] on 04/11/16 at 2:30pm. awt

## 2016-04-09 NOTE — Telephone Encounter (Signed)
Called patient to let her know 13-hour prep has been called in to [facility] [phone]).  She is to take Prednisone 50mg  04/10/16 @ 2240, 04/11/16 @ 0440 and 1040.  Also on 04/11/16 @ 1040 she is to take Benadryl 50mg .  jkl

## 2016-04-09 NOTE — Progress Notes (Signed)
[facility] [place]  Patient name: [name] CroftVirginia G Pickering MRN: [id number] DOB: 10/08/1945 Sex: female   REASON FOR VISIT:    Follow-up  HISOTRY OF PRESENT ILLNESS:    This is a 71 y.o. female who presents today with complaints of 4 months of neck pain that started on the right side.  She states that it was unbearable.  Described it like an "earache" and unable to bend over.  It got so bad that she asked her husband to take her to the ER.  She underwent a CT scan at that time that she states revealed a small mass.  She was referred to Dr. Jenne PaneBates (ENT).  This was described as inflammation and she was given two weeks of solumedrol.  She returned for a repeat CT scan, which revealed very little change and now with a mass on the left side.   She was placed on Toradol shots daily x 7 days as well as 2 weeks of prednisone.  At about the 11th day of the prednisone, she developed shingles around the left breast onto her back.  She did not get any relief from this.  She states that she has had about a 10-15lb weight loss, she has no energy, she is having severe night sweats, she has trembles/shakes and overall does not feel well.  She does not have any appetite and forces herself to eat.  She does have gastroparesis.  The only neck trauma she has had is two spinal fusions with the first being in 2002 and the 2nd one in 2014 where they went through the neck.   She states that she is disappointed that she will not be able to do Thanksgiving dinner for the 1st time in 47 years as she just does not feel good.    Referred her to rheumatology as I felt this could potentially be a vasculitis.  They had requested biopsy.  The patient states that she has not improved.  She is now starting to get headaches which she previously rarely had.  She does report some dysphagia as well as hoarseness.   PAST MEDICAL HISTORY:   Past Medical History:  Diagnosis Date    . Arthritis    "little bit qwhere" (01/24/2012)  . Asthma   . Chronic back pain    "neck to tailbone" (01/24/2012)  . Chronic bronchitis (HCC)    "last time ~ 1 month ago" (01/24/2012)  . COPD (chronic obstructive pulmonary disease) (HCC)   . Depression    takes Zoloft daily  . Fibromyalgia   . History of bladder infections    sees a urologist--on long term Macrodantin nightly  . History of blood transfusion    as a child  . History of blood transfusion    "when I was a child" (01/24/2012)  . Hyperlipidemia    takes Simvastatin daily  . Hypertension    takes Maxzide daily  . Hypothyroidism    takes Synthroid daily  . Insomnia    takes Ambien nightly  . Lupus   . Neck pain    herniated disc and radiculopathy  . Peripheral neuropathy (HCC)   . Pneumonia 2012   "have had it a couple times" (01/24/2012)  . Smokers' cough (HCC)    has albuterol prn  . Type II diabetes mellitus (HCC)    takes Janumet daily  . Urinary frequency      FAMILY HISTORY:   Family History  Problem Relation Age of Onset  .  Heart disease Mother   . Arrhythmia Father   . Heart disease Sister   . Heart attack Brother   . Diabetes Brother     SOCIAL HISTORY:   Social History  Substance Use Topics  . Smoking status: Current Some Day Smoker    Packs/day: 0.50    Years: 60.00    Types: Cigarettes  . Smokeless tobacco: Current User     Comment: vapor ciggs  . Alcohol use No     ALLERGIES:   Allergies  Allergen Reactions  . Erythromycin Anaphylaxis  . Penicillins Anaphylaxis  . Gadolinium Derivatives Hives and Rash    Pt broke out in hives all over body.  Has IV steroids and Benadryl in hospital for MRI.  Marland Kitchen Iodinated Diagnostic Agents Hives    13-hour prep Pt had slight itching of her abd after her injection and had taken her 13 hr prep.  Consult with radiologist before another procedure to see if IV contrast is necessary, per Dr Roswell Nickel on 01/06/14, JB/  . Latex Dermatitis and  Other (See Comments)    blisters  . Morphine Nausea And Vomiting  . Other Itching and Other (See Comments)    Chlorhexin (CHG); "burning"  . Prednisone Other (See Comments)    "shakes me out of my frame" IM steroids aren't as bad as PO  . Sulfonamide Derivatives Itching and Rash    All over body   . Varenicline Tartrate Nausea And Vomiting       . Atorvastatin   . Aspirin Nausea Only and Other (See Comments)    Burning in stomach      CURRENT MEDICATIONS:   Current Outpatient Prescriptions  Medication Sig Dispense Refill  . ALPRAZolam (XANAX) 0.25 MG tablet Take 0.25 mg by mouth daily as needed for anxiety.     Marland Kitchen amLODipine (NORVASC) 5 MG tablet TAKE ONE (1) TABLET EACH DAY 30 tablet 3  . docusate sodium (COLACE) 100 MG capsule Take 100 mg by mouth daily.    . Insulin Glargine (TOUJEO SOLOSTAR) 300 UNIT/ML SOPN Inject 20-40 Units into the skin daily. If Blood GLucose Lvl is Under 200 use 20units if lvl is  above 200 use 40 units;    . levothyroxine (SYNTHROID, LEVOTHROID) 112 MCG tablet Take 112 mcg by mouth daily.    . Magnesium Hydroxide (MAGNESIA PO) Take 1 tablet by mouth daily.    . metFORMIN (GLUCOPHAGE) 1000 MG tablet Take 1,000 mg by mouth daily.     . metoCLOPramide (REGLAN) 5 MG tablet Take 5 mg by mouth 2 (two) times daily.    . nitroGLYCERIN (NITROSTAT) 0.4 MG SL tablet Place 1 tablet (0.4 mg total) under the tongue every 5 (five) minutes x 3 doses as needed for chest pain. If no relief after 3rd dose, proceed to the ED for an evaluation 25 tablet 3  . NON FORMULARY Take 1 capsule by mouth daily. Supplied by Andi Devon, MD Thyroid supplement in addition to Synthroid taken daily    . pantoprazole (PROTONIX) 40 MG tablet Take 40 mg by mouth daily.    . sertraline (ZOLOFT) 100 MG tablet Take 100 mg by mouth daily.     Marland Kitchen triamterene-hydrochlorothiazide (MAXZIDE-25) 37.5-25 MG per tablet Take 1 tablet by mouth daily.    Marland Kitchen zolpidem (AMBIEN) 5 MG tablet Take 5 mg by  mouth at bedtime. For sleep     No current facility-administered medications for this visit.     REVIEW OF SYSTEMS:   [X]  denotes  positive finding, [ ]  denotes negative finding Cardiac  Comments:  Chest pain or chest pressure:    Shortness of breath upon exertion:    Short of breath when lying flat:    Irregular heart rhythm:        Vascular    Pain in calf, thigh, or hip brought on by ambulation:    Pain in feet at night that wakes you up from your sleep:     Blood clot in your veins:    Leg swelling:         Pulmonary    Oxygen at home:    Productive cough:     Wheezing:         Neurologic    Sudden weakness in arms or legs:     Sudden numbness in arms or legs:     Sudden onset of difficulty speaking or slurred speech:    Temporary loss of vision in one eye:     Problems with dizziness:         Gastrointestinal    Blood in stool:     Vomited blood:         Genitourinary    Burning when urinating:     Blood in urine:        Psychiatric    Major depression:         Hematologic    Bleeding problems:    Problems with blood clotting too easily:        Skin    Rashes or ulcers:        Constitutional    Fever or chills:      PHYSICAL EXAM:   Vitals:   04/09/16 1104 04/09/16 1108  BP: 110/71 111/73  Pulse: 71   Resp: 16   Temp: 97 F (36.1 C)   TempSrc: Oral   SpO2: 96%   Weight: 132 lb (59.9 kg)   Height: 5\' 5"  (1.651 m)     GENERAL: The patient is a well-nourished female, in no acute distress. The vital signs are documented above. CARDIAC: There is a regular rate and rhythm.  PULMONARY: Non-labored respirations MUSCULOSKELETAL: There are no major deformities or cyanosis. NEUROLOGIC: No focal weakness or paresthesias are detected. SKIN: There are no ulcers or rashes noted. PSYCHIATRIC: The patient has a normal affect.  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  I told the patient that I am reluctant to proceed with biopsy as  the area looks very indiscrete on CT scan.  In addition, the area appears to be posterior to the carotid bifurcation which means there is a high chance it is sitting on the vagus nerve which could explain some of her symptoms but also could be injured from biopsy.  Proceeding with biopsy could potentially change her options should she require carotid endarterectomy in the future.  Because the patient continues to be having difficulty, I will repeat her CT scan to see if this area is more discrete.  She will have this done with the next day or 2 and follow-up with me on Wednesday.    Durene Cal, MD [facility] [phone] Pager [phone]

## 2016-04-09 NOTE — Addendum Note (Signed)
Addended by: Burton ApleyPETTY, Dwyane Dupree A on: 04/09/2016 12:23 PM   Modules accepted: Orders

## 2016-04-11 ENCOUNTER — Ambulatory Visit (INDEPENDENT_AMBULATORY_CARE_PROVIDER_SITE_OTHER): Payer: Medicare Other | Admitting: Surgery

## 2016-04-11 ENCOUNTER — Encounter: Payer: Self-pay | Admitting: Surgery

## 2016-04-11 ENCOUNTER — Ambulatory Visit
Admission: RE | Admit: 2016-04-11 | Discharge: 2016-04-11 | Disposition: A | Discharge status: Home / Self Care | Payer: Medicare Other | Source: Ambulatory Visit | Attending: Surgery | Admitting: Surgery

## 2016-04-11 VITALS — BP 128/69 | HR 94 | Temp 97.5°F | Resp 18 | Ht 65.0 in | Wt 133.7 lb

## 2016-04-11 DIAGNOSIS — G9001 Carotid sinus syncope: Secondary | ICD-10-CM | POA: Diagnosis not present

## 2016-04-11 MED ORDER — IOPAMIDOL (ISOVUE-370) INJECTION 76%
75.0000 mL | Freq: Once | INTRAVENOUS | Status: AC | PRN
  Administered 2016-04-11: 75 mL via INTRAVENOUS

## 2016-04-11 NOTE — Progress Notes (Signed)
[facility] [place]  Patient name: [name] MRN: [id number] DOB: [birth year] Sex: female   REASON FOR VISIT:    Follow up  HISOTRY OF PRESENT ILLNESS:   This is a 71 y.o.femalewho presents today with complaints of 4 months of neck pain that started on the right side. She states that it was unbearable. Described it like an "earache" and unable to bend over. It got so bad that she asked her husband to take her to the ER. She underwent a CT scan at that time that she states revealed a small mass. She was referred to Dr. Jenne Pane (ENT). This was described as inflammation and she was given two weeks of solumedrol. She returned for a repeat CT scan, which revealed very little change and now with a mass on the left side. She was placed on Toradol shots daily x 7 days as well as 2 weeks of prednisone. At about the 11th day of the prednisone, she developed shingles around the left breast onto her back. She did not get any relief from this.  She states that she has had about a 10-15lb weight loss, she has no energy, she is having severe night sweats, she has trembles/shakes and overall does not feel well. She does not have any appetite and forces herself to eat. She does have gastroparesis. The only neck trauma she has had is two spinal fusions with the first being in 2002 and the 2nd one in 2014 where they went through the neck. She states that she is disappointed that she will not be able to do Thanksgiving dinner for the 1st time in 47 years as she just does not feel good.   Referred her to rheumatology as I felt this could potentially be a vasculitis.  They had requested biopsy.  The patient states that she has not improved.  She is now starting to get headaches which she previously rarely had.  She does report some dysphagia as well as hoarseness  Comes back for discussions of her repeat CT scan   PAST MEDICAL  HISTORY:   Past Medical History:  Diagnosis Date  . Arthritis    "little bit qwhere" (01/24/2012)  . Asthma   . Chronic back pain    "neck to tailbone" (01/24/2012)  . Chronic bronchitis (HCC)    "last time ~ 1 month ago" (01/24/2012)  . COPD (chronic obstructive pulmonary disease) (HCC)   . Depression    takes Zoloft daily  . Fibromyalgia   . History of bladder infections    sees a urologist--on long term Macrodantin nightly  . History of blood transfusion    as a child  . History of blood transfusion    "when I was a child" (01/24/2012)  . Hyperlipidemia    takes Simvastatin daily  . Hypertension    takes Maxzide daily  . Hypothyroidism    takes Synthroid daily  . Insomnia    takes Ambien nightly  . Lupus   . Neck pain    herniated disc and radiculopathy  . Peripheral neuropathy (HCC)   . Pneumonia 2012   "have had it a couple times" (01/24/2012)  . Smokers' cough (HCC)    has albuterol prn  . Type II diabetes mellitus (HCC)    takes Janumet daily  . Urinary frequency      FAMILY HISTORY:   Family History  Problem Relation Age of Onset  . Heart disease Mother   . Arrhythmia Father   .  Heart disease Sister   . Heart attack Brother   . Diabetes Brother     SOCIAL HISTORY:   Social History  Substance Use Topics  . Smoking status: Current Some Day Smoker    Packs/day: 0.50    Years: 60.00    Types: Cigarettes  . Smokeless tobacco: Current User     Comment: vapor ciggs  . Alcohol use No     ALLERGIES:   Allergies  Allergen Reactions  . Erythromycin Anaphylaxis  . Penicillins Anaphylaxis  . Gadolinium Derivatives Hives and Rash    Pt broke out in hives all over body.  Has IV steroids and Benadryl in hospital for MRI.  Marland Kitchen Iodinated Diagnostic Agents Hives    13-hour prep Pt had slight itching of her abd after her injection and had taken her 13 hr prep.  Consult with radiologist before another procedure to see if IV contrast is necessary, per Dr  Ros[place] Nickel on 01/06/14, JB/  . Latex Dermatitis and Other (See Comments)    blisters  . Morphine Nausea And Vomiting  . Other Itching and Other (See Comments)    Chlorhexin (CHG); "burning"  . Prednisone Other (See Comments)    "shakes me out of my frame" IM steroids aren't as bad as PO  . Sulfonamide Derivatives Itching and Rash    All over body   . Varenicline Tartrate Nausea And Vomiting       . Atorvastatin   . Aspirin Nausea Only and Other (See Comments)    Burning in stomach      CURRENT MEDICATIONS:   Current Outpatient Prescriptions  Medication Sig Dispense Refill  . ALPRAZolam (XANAX) 0.25 MG tablet Take 0.25 mg by mouth daily as needed for anxiety.     Marland Kitchen amLODipine (NORVASC) 5 MG tablet TAKE ONE (1) TABLET EACH DAY 30 tablet 3  . docusate sodium (COLACE) 100 MG capsule Take 100 mg by mouth daily.    . Insulin Glargine (TOUJEO SOLOSTAR) 300 UNIT/ML SOPN Inject 20-40 Units into the skin daily. If Blood GLucose Lvl is Under 200 use 20units if lvl is  above 200 use 40 units;    . levothyroxine (SYNTHROID, LEVOTHROID) 112 MCG tablet Take 112 mcg by mouth daily.    . Magnesium Hydroxide (MAGNESIA PO) Take 1 tablet by mouth daily.    . metFORMIN (GLUCOPHAGE) 1000 MG tablet Take 1,000 mg by mouth daily.     . metoCLOPramide (REGLAN) 5 MG tablet Take 5 mg by mouth 2 (two) times daily.    . nitroGLYCERIN (NITROSTAT) 0.4 MG SL tablet Place 1 tablet (0.4 mg total) under the tongue every 5 (five) minutes x 3 doses as needed for chest pain. If no relief after 3rd dose, proceed to the ED for an evaluation 25 tablet 3  . NON FORMULARY Take 1 capsule by mouth daily. Supplied by Andi Devon, MD Thyroid supplement in addition to Synthroid taken daily    . pantoprazole (PROTONIX) 40 MG tablet Take 40 mg by mouth daily.    . sertraline (ZOLOFT) 100 MG tablet Take 100 mg by mouth daily.     Marland Kitchen triamterene-hydrochlorothiazide (MAXZIDE-25) 37.5-25 MG per tablet Take 1 tablet by mouth daily.      Marland Kitchen zolpidem (AMBIEN) 5 MG tablet Take 5 mg by mouth at bedtime. For sleep     No current facility-administered medications for this visit.     REVIEW OF SYSTEMS:   [X]  denotes positive finding, [ ]  denotes negative finding Cardiac  Comments:  Chest pain or chest pressure:    Shortness of breath upon exertion:    Short of breath when lying flat:    Irregular heart rhythm:        Vascular    Pain in calf, thigh, or hip brought on by ambulation:    Pain in feet at night that wakes you up from your sleep:     Blood clot in your veins:    Leg swelling:         Pulmonary    Oxygen at home:    Productive cough:     Wheezing:         Neurologic    Sudden weakness in arms or legs:     Sudden numbness in arms or legs:     Sudden onset of difficulty speaking or slurred speech:    Temporary loss of vision in one eye:     Problems with dizziness:         Gastrointestinal    Blood in stool:     Vomited blood:         Genitourinary    Burning when urinating:     Blood in urine:        Psychiatric    Major depression:         Hematologic    Bleeding problems:    Problems with blood clotting too easily:        Skin    Rashes or ulcers:        Constitutional    Fever or chills:      PHYSICAL EXAM:   Vitals:   04/11/16 1438  BP: 128/69  Pulse: 94  Resp: 18  Temp: 97.5 F (36.4 C)  TempSrc: Oral  SpO2: 95%  Weight: 133 lb 11.2 oz (60.6 kg)  Height: 5\' 5"  (1.651 m)    GENERAL: The patient is a well-nourished female, in no acute distress. The vital signs are documented above. CARDIAC: There is a regular rate and rhythm.  PULMONARY: Non-labored respirations MUSCULOSKELETAL: There are no major deformities or cyanosis. NEUROLOGIC: No focal weakness or paresthesias are detected. SKIN: There are no ulcers or rashes noted. PSYCHIATRIC: The patient has a normal affect.  STUDIES:   I have reviewed her CT scan with the following findings  1. Stable mild prominence  of soft tissue about the distal common carotid arteries and carotid bifurcations bilaterally compatible with the diagnosis of carotidynia. There is no evidence for disease progression 2. Bilateral atherosclerotic calcifications at the carotid bifurcations, right greater than left. There is no significant stenosis. 3. Stable cervical spine fusion. 4. Scarring at the lung apices. MEDICAL ISSUES:   There is been no appreciable change in the soft tissue density behind the right carotid.  I discussed with the patient that I need to discuss this further with rheumatology.  I have some reservations regarding a soft tissue biopsy particularly since this lesion sits behind a calcified oh (diseased) carotid artery.  This brings in the possibility of stroke and vagus nerve injury with soft tissue biopsy.  In addition it could potentially make carotid endarterectomy should she require this at a later date more challenging.  I will contact the patient after I have talked to Dr. Dierdre ForthBeekman did       Durene CalWells Georges Victorio, MD [facility] Tel [phone] Pager [phone]) [phone]

## 2016-06-13 ENCOUNTER — Other Ambulatory Visit: Payer: Self-pay | Admitting: Cardiology

## 2016-07-02 ENCOUNTER — Encounter (HOSPITAL_COMMUNITY): Payer: Medicare Other

## 2016-07-02 ENCOUNTER — Ambulatory Visit: Payer: Medicare Other | Admitting: Surgery

## 2016-07-12 ENCOUNTER — Other Ambulatory Visit: Payer: Self-pay | Admitting: Cardiovascular Disease

## 2016-08-16 DIAGNOSIS — R519 Headache, unspecified: Secondary | ICD-10-CM | POA: Insufficient documentation

## 2016-09-25 ENCOUNTER — Encounter: Payer: Self-pay | Admitting: Surgery

## 2016-10-15 ENCOUNTER — Ambulatory Visit (INDEPENDENT_AMBULATORY_CARE_PROVIDER_SITE_OTHER): Payer: Medicare Other | Admitting: Surgery

## 2016-10-15 ENCOUNTER — Encounter: Payer: Self-pay | Admitting: Surgery

## 2016-10-15 ENCOUNTER — Ambulatory Visit (HOSPITAL_COMMUNITY)
Admission: RE | Admit: 2016-10-15 | Discharge: 2016-10-15 | Disposition: A | Discharge status: Home / Self Care | Payer: Medicare Other | Source: Ambulatory Visit | Attending: Surgery | Admitting: Surgery

## 2016-10-15 VITALS — BP 114/69 | HR 69 | Temp 97.4°F | Resp 16 | Ht 65.0 in | Wt 134.8 lb

## 2016-10-15 DIAGNOSIS — G9001 Carotid sinus syncope: Secondary | ICD-10-CM

## 2016-10-15 DIAGNOSIS — I700 Atherosclerosis of aorta: Secondary | ICD-10-CM | POA: Diagnosis not present

## 2016-10-15 DIAGNOSIS — I6523 Occlusion and stenosis of bilateral carotid arteries: Secondary | ICD-10-CM | POA: Insufficient documentation

## 2016-10-15 LAB — VAS US CAROTID
LEFT ECA DIAS: -18 cm/s
Left CCA dist dias: 20 cm/s
Left CCA dist sys: 74 cm/s
Left CCA prox dias: 18 cm/s
Left CCA prox sys: 88 cm/s
Left ICA dist dias: -29 cm/s
Left ICA dist sys: -83 cm/s
RIGHT CCA MID DIAS: 16 cm/s
RIGHT ECA DIAS: -22 cm/s
Right CCA prox dias: 15 cm/s
Right CCA prox sys: 62 cm/s

## 2016-10-15 NOTE — Progress Notes (Signed)
[facility] [place]  Patient name: [name] MRN: [id number] DOB: 08/11/1945 Sex: female   REASON FOR VISIT:    Follow up  Kappa:    [name] is a 71 y.o. female who I initially met in February 2018.  She had approximately a 4 month history of neck pain that initially began on the right side.  It became unbearable.  She described it as an earache.  She is unable to bend over.  It got so bad that she had her husband take her to the emergency department.  She underwent a CT scan that showed a small mass.  She was referred to Dr. Redmond Baseman (ENT).  This was felt to be inflammation and she was giving 2 weeks of steroids.  She returned for repeat CT scan 2 weeks later and there was very little change.  She was placed on Toradol shots daily for 7 days.  On approximately the 11th day of the steroids, she developed shingles.  She reports approximately 10-15 pound weight loss.  She does not have any energy.  She was having night sweats trembles and shakes.  She denied any history of neck trauma she does have a history of 2 spinal fusions.  These were done through the neck.  I referred the patient to rheumatology.  A biopsy had been requested that I was reluctant to proceed with biopsy.  The patient was ultimately sent to [facility].  There, she underwent a PET scan as well as a full body CT scan which she tells me were unremarkable.  She was sent to a neuro ophthalmologist who ruled out Graves' disease.  She also had a temporal artery biopsy which was negative.  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  The patient states that she is somewhat better.  She has gained about 3 pounds.  She is comfortable with her current situation.   PAST MEDICAL HISTORY:   Past Medical History:  Diagnosis Date  . Arthritis    "little bit qwhere" (01/24/2012)  . Asthma   . Chronic back pain      "neck to tailbone" (01/24/2012)  . Chronic bronchitis ([place])    "last time ~ 1 month ago" (01/24/2012)  . COPD (chronic obstructive pulmonary disease) ([place])   . Depression    takes Zoloft daily  . Fibromyalgia   . History of bladder infections    sees a urologist--on long term Macrodantin nightly  . History of blood transfusion    as a child  . History of blood transfusion    "when I was a child" (01/24/2012)  . Hyperlipidemia    takes Simvastatin daily  . Hypertension    takes Maxzide daily  . Hypothyroidism    takes Synthroid daily  . Insomnia    takes Ambien nightly  . Lupus   . Neck pain    herniated disc and radiculopathy  . Peripheral neuropathy   . Pneumonia 2012   "have had it a couple times" (01/24/2012)  . Smokers' cough (HCC)    has albuterol prn  . Type II diabetes mellitus (HCC)    takes Janumet daily  . Urinary frequency      FAMILY HISTORY:   Family History  Problem Relation Age of Onset  . Heart disease Mother   . Arrhythmia Father   . Heart disease Sister   . Heart attack Brother   . Diabetes Brother  SOCIAL HISTORY:   Social History  Substance Use Topics  . Smoking status: Current Some Day Smoker    Packs/day: 0.50    Years: 60.00    Types: Cigarettes  . Smokeless tobacco: Current User     Comment: vapor ciggs  . Alcohol use No     ALLERGIES:   Allergies  Allergen Reactions  . Erythromycin Anaphylaxis  . Penicillins Anaphylaxis  . Gadolinium Derivatives Hives and Rash    Pt broke out in hives all over body.  Has IV steroids and Benadryl in hospital for MRI.  Marland Kitchen Iodinated Diagnostic Agents Hives    13-hour prep Pt had slight itching of her abd after her injection and had taken her 13 hr prep.  Consult with radiologist before another procedure to see if IV contrast is necessary, per Dr Almyra Free on 01/06/14, JB/  . Latex Dermatitis and Other (See Comments)    blisters  . Morphine Nausea And Vomiting  . Other Itching and Other  (See Comments)    Chlorhexin (CHG); "burning"  . Prednisone Other (See Comments)    "shakes me out of my frame" IM steroids aren't as bad as PO  . Sulfonamide Derivatives Itching and Rash    All over body   . Varenicline Tartrate Nausea And Vomiting       . Atorvastatin   . Aspirin Nausea Only and Other (See Comments)    Burning in stomach      CURRENT MEDICATIONS:   Current Outpatient Prescriptions  Medication Sig Dispense Refill  . ALPRAZolam (XANAX) 0.25 MG tablet Take 0.25 mg by mouth daily as needed for anxiety.     Marland Kitchen amLODipine (NORVASC) 5 MG tablet TAKE ONE (1) TABLET EACH DAY 15 tablet 0  . docusate sodium (COLACE) 100 MG capsule Take 100 mg by mouth daily.    . Insulin Glargine (TOUJEO SOLOSTAR) 300 UNIT/ML SOPN Inject 20-40 Units into the skin daily. If Blood GLucose Lvl is Under 200 use 20units if lvl is  above 200 use 40 units;    . levothyroxine (SYNTHROID, LEVOTHROID) 112 MCG tablet Take 112 mcg by mouth daily.    . Magnesium Hydroxide (MAGNESIA PO) Take 1 tablet by mouth daily.    . metFORMIN (GLUCOPHAGE) 1000 MG tablet Take 1,000 mg by mouth daily.     . metoCLOPramide (REGLAN) 5 MG tablet Take 5 mg by mouth 2 (two) times daily.    . nitroGLYCERIN (NITROSTAT) 0.4 MG SL tablet Place 1 tablet (0.4 mg total) under the tongue every 5 (five) minutes x 3 doses as needed for chest pain. If no relief after 3rd dose, proceed to the ED for an evaluation 25 tablet 3  . NON FORMULARY Take 1 capsule by mouth daily. Supplied by Willey Blade, MD Thyroid supplement in addition to Synthroid taken daily    . pantoprazole (PROTONIX) 40 MG tablet Take 40 mg by mouth daily.    . sertraline (ZOLOFT) 100 MG tablet Take 100 mg by mouth daily.     Marland Kitchen triamterene-hydrochlorothiazide (MAXZIDE-25) 37.5-25 MG per tablet Take 1 tablet by mouth daily.    Marland Kitchen zolpidem (AMBIEN) 5 MG tablet Take 5 mg by mouth at bedtime. For sleep     No current facility-administered medications for this visit.       REVIEW OF SYSTEMS:   _0  denotes positive finding, _1  denotes negative finding Cardiac  Comments:  Chest pain or chest pressure:    Shortness of breath upon exertion:    Short  of breath when lying flat:    Irregular heart rhythm:        Vascular    Pain in calf, thigh, or hip brought on by ambulation:    Pain in feet at night that wakes you up from your sleep:     Blood clot in your veins:    Leg swelling:         Pulmonary    Oxygen at home:    Productive cough:     Wheezing:         Neurologic    Sudden weakness in arms or legs:     Sudden numbness in arms or legs:     Sudden onset of difficulty speaking or slurred speech:    Temporary loss of vision in one eye:     Problems with dizziness:         Gastrointestinal    Blood in stool:     Vomited blood:         Genitourinary    Burning when urinating:     Blood in urine:        Psychiatric    Major depression:         Hematologic    Bleeding problems:    Problems with blood clotting too easily:        Skin    Rashes or ulcers:        Constitutional    Fever or chills:      PHYSICAL EXAM:   Vitals:   10/15/16 1154  BP: 114/69  Pulse: 69  Resp: 16  Temp: (!) 97.4 F (36.3 C)  TempSrc: Oral  SpO2: 98%  Weight: 134 lb 12.8 oz (61.1 kg)  Height: _0  (1.651 m)    GENERAL: The patient is a well-nourished female, in no acute distress. The vital signs are documented above. CARDIAC: There is a regular rate and rhythm.  VASCULAR: Palpable pedal pulses PULMONARY: Non-labored respirations MUSCULOSKELETAL: There are no major deformities or cyanosis. NEUROLOGIC: No focal weakness or paresthesias are detected. SKIN: There are no ulcers or rashes noted. PSYCHIATRIC: The patient has a normal affect.  STUDIES:   Carotid duplex was ordered and reviewed today.  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  Her symptoms remain  stable.  No acute intervention is recommended at this time.  I will continue to follow her carotid arteries.  Because of the minimal stenosis, I will not have her follow up for at least 3 years.  We did discuss the signs and symptoms of claudication and to contact me should she develop the symptoms.    Annamarie Major, MD [facility] [phone] Pager [phone]

## 2016-10-23 NOTE — Addendum Note (Signed)
Addended by: Burton Apley A on: 10/23/2016 03:16 PM   Modules accepted: Orders

## 2017-01-01 ENCOUNTER — Ambulatory Visit (INDEPENDENT_AMBULATORY_CARE_PROVIDER_SITE_OTHER): Payer: Medicare Other | Admitting: *Deleted

## 2017-01-01 DIAGNOSIS — Z23 Encounter for immunization: Secondary | ICD-10-CM | POA: Diagnosis not present

## 2017-11-13 ENCOUNTER — Other Ambulatory Visit: Payer: Self-pay | Admitting: Internal Medicine

## 2017-11-13 DIAGNOSIS — M546 Pain in thoracic spine: Secondary | ICD-10-CM

## 2017-11-23 ENCOUNTER — Ambulatory Visit
Admission: RE | Admit: 2017-11-23 | Discharge: 2017-11-23 | Disposition: A | Discharge status: Home / Self Care | Payer: Medicare Other | Source: Ambulatory Visit | Attending: Internal Medicine | Admitting: Internal Medicine

## 2017-11-23 DIAGNOSIS — M546 Pain in thoracic spine: Secondary | ICD-10-CM

## 2017-12-26 ENCOUNTER — Other Ambulatory Visit: Payer: Self-pay | Admitting: Internal Medicine

## 2017-12-26 ENCOUNTER — Ambulatory Visit
Admission: RE | Admit: 2017-12-26 | Discharge: 2017-12-26 | Disposition: A | Discharge status: Home / Self Care | Payer: Medicare Other | Source: Ambulatory Visit | Attending: Internal Medicine | Admitting: Internal Medicine

## 2017-12-26 DIAGNOSIS — R05 Cough: Secondary | ICD-10-CM

## 2017-12-26 DIAGNOSIS — R059 Cough, unspecified: Secondary | ICD-10-CM

## 2018-06-17 ENCOUNTER — Telehealth: Payer: Self-pay | Admitting: *Deleted

## 2018-06-17 NOTE — Telephone Encounter (Signed)
Pt verbalized consent for telehealth appt with Dr Purvis Sheffield 06/18/18 and that insurance would be billed for encounter. Pt does not have a way to check HR/BP at home but will have weight available. Updated pt medications/allergies/pharmacy.

## 2018-06-18 ENCOUNTER — Telehealth (INDEPENDENT_AMBULATORY_CARE_PROVIDER_SITE_OTHER): Payer: Medicare Other | Admitting: Cardiovascular Disease

## 2018-06-18 ENCOUNTER — Encounter: Payer: Self-pay | Admitting: Cardiovascular Disease

## 2018-06-18 VITALS — Ht 65.0 in | Wt 125.0 lb

## 2018-06-18 DIAGNOSIS — R079 Chest pain, unspecified: Secondary | ICD-10-CM | POA: Diagnosis not present

## 2018-06-18 DIAGNOSIS — I10 Essential (primary) hypertension: Secondary | ICD-10-CM

## 2018-06-18 NOTE — Patient Instructions (Signed)
Medication Instructions:  Continue all current medications.  Labwork: none  Testing/Procedures: none  Follow-Up: 4 months - early August   Any Other Special Instructions Will Be Listed Below (If Applicable).  If you need a refill on your cardiac medications before your next appointment, please call your pharmacy.

## 2018-06-18 NOTE — Progress Notes (Signed)
Virtual Visit via Telephone Note   This visit type was conducted due to national recommendations for restrictions regarding the COVID-19 Pandemic (e.g. social distancing) in an effort to limit this patient's exposure and mitigate transmission in our community.  Due to her co-morbid illnesses, this patient is at least at moderate risk for complications without adequate follow up.  This format is felt to be most appropriate for this patient at this time.  The patient did not have access to video technology/had technical difficulties with video requiring transitioning to audio format only (telephone).  All issues noted in this document were discussed and addressed.  No physical exam could be performed with this format.  Please refer to the patient's chart for her  consent to telehealth for [facility].   Evaluation Performed:  New Patient visit  Date:  06/18/2018   ID:  [name], DOB 11/16/1945, MRN [id number]  Patient Location: Home Provider Location: Office  PCP:  Andi DevonShelton, Kimberly, MD  Cardiologist:  No primary care provider on file.  Electrophysiologist:  None   Chief Complaint:  Chest pain   History of Present Illness:    [name] is a 73 y.o. female with a history of chest pain, hypertension, and hyperlipidemia.  I last evaluated her on 05/24/2015.  She had previously undergone a low risk nuclear stress test with a hypertensive response to exercise.  I started her on amlodipine at that time.  She has a h/o carotidynia. She was seen by Dr. Myra GianottiBrabham (vascular surgery). She had a negative temporal artery biopsy. [facility] and ophthalmology.  She has severe mid thoracic back pain for which she takes Percocet prn. She has pain shooting down the left side of her left leg.  She was recently bitten by several ticks and this has happened in the past.  She's had one episode of chest pain while running to catch her cat about 3 weeks ago.  She had some associated dyspnea.  She took one nitroglycerin tablet with symptom relief.  The patient does not have symptoms concerning for COVID-19 infection (fever, chills, cough, or new shortness of breath).   Social History: Her sister is my patient, Administrator, Civil Serviceusselene Disher IT sales professional([employer]). Her husband, Greggory StallionGeorge, is also my patient. Her brother in law, Maurine SimmeringDavid Disher, is also my patient.   Past Medical History:  Diagnosis Date  . Arthritis    "little bit qwhere" (01/24/2012)  . Asthma   . Chronic back pain    "neck to tailbone" (01/24/2012)  . Chronic bronchitis (HCC)    "last time ~ 1 month ago" (01/24/2012)  . COPD (chronic obstructive pulmonary disease) (HCC)   . Depression    takes Zoloft daily  . Fibromyalgia   . History of bladder infections    sees a urologist--on long term Macrodantin nightly  . History of blood transfusion    as a child  . History of blood transfusion    "when I was a child" (01/24/2012)  . Hyperlipidemia    takes Simvastatin daily  . Hypertension    takes Maxzide daily  . Hypothyroidism    takes Synthroid daily  . Insomnia    takes Ambien nightly  . Lupus (HCC)   . Neck pain    herniated disc and radiculopathy  . Peripheral neuropathy   . Pneumonia 2012   "have had it a couple times" (01/24/2012)  . Smokers' cough (HCC)    has albuterol prn  . Type II diabetes  mellitus (HCC)    takes Janumet daily  . Urinary frequency    Past Surgical History:  Procedure Laterality Date  . ANTERIOR CERVICAL DECOMP/DISCECTOMY FUSION  2002; 01/24/2012  . ANTERIOR CERVICAL DECOMP/DISCECTOMY FUSION  01/24/2012   Procedure: ANTERIOR CERVICAL DECOMPRESSION/DISCECTOMY FUSION 1 LEVEL;  Surgeon: Cristi Loron, MD;  Location: [facility];  Service: Neurosurgery;  Laterality: N/A;  CERVICAL THREE-FOUR anterior cervical decompression with fusion interbody prothesis plating and bonegraft  . APPENDECTOMY  1960's  . BACK SURGERY  2001  . BLADDER SURGERY  1977   through abdomen   . BLADDER SUSPENSION  1974  . BUNIONECTOMY  1980's   left  . CARDIAC CATHETERIZATION  2008  . COLONOSCOPY    . ESOPHAGOGASTRODUODENOSCOPY    . LUMBAR FUSION  2009  . RECTOCELE REPAIR  2000  . VAGINAL HYSTERECTOMY  1974     Current Meds  Medication Sig  . levothyroxine (SYNTHROID, LEVOTHROID) 50 MCG tablet Take 50 mcg by mouth daily before breakfast.  . metFORMIN (GLUCOPHAGE) 1000 MG tablet Take 1,000 mg by mouth daily.   . metoCLOPramide (REGLAN) 5 MG tablet Take 5 mg by mouth 2 (two) times daily.  . nitroGLYCERIN (NITROSTAT) 0.4 MG SL tablet Place 1 tablet (0.4 mg total) under the tongue every 5 (five) minutes x 3 doses as needed for chest pain. If no relief after 3rd dose, proceed to the ED for an evaluation  . sertraline (ZOLOFT) 100 MG tablet Take 100 mg by mouth daily.   Marland Kitchen triamterene-hydrochlorothiazide (MAXZIDE-25) 37.5-25 MG per tablet Take 1 tablet by mouth daily.  Marland Kitchen zolpidem (AMBIEN) 5 MG tablet Take 5 mg by mouth at bedtime. For sleep     Allergies:   Erythromycin; Penicillins; Gadolinium derivatives; Iodinated diagnostic agents; Latex; Morphine; Other; Prednisone; Sulfonamide derivatives; Varenicline tartrate; Atorvastatin; and Aspirin   Social History   Tobacco Use  . Smoking status: Current Some Day Smoker    Packs/day: 0.50    Years: 60.00    Pack years: 30.00    Types: Cigarettes  . Smokeless tobacco: Current User  . Tobacco comment: vapor ciggs  Substance Use Topics  . Alcohol use: No    Alcohol/week: 0.0 standard drinks  . Drug use: No     Family Hx: The patient's family history includes Arrhythmia in her father; Diabetes in her brother; Heart attack in her brother; Heart disease in her mother and sister.  ROS:   Please see the history of present illness.     All other systems reviewed and are negative.   Prior CV studies:   The following studies were reviewed today:  Carotid Dopplers, Care Everywhere extensive record review  Labs/Other Tests  and Data Reviewed:    EKG:  No ECG reviewed.  Recent Labs: No results found for requested labs within last 8760 hours.   Recent Lipid Panel Lab Results  Component Value Date/Time   CHOL (H) 09/07/2006 06:05 AM    271        ATP III CLASSIFICATION:  <200     mg/dL   Desirable  161-096  mg/dL   Borderline High  >=045    mg/dL   High   TRIG 409 (H) 81/19/1478 06:05 AM   HDL 31 (L) 09/07/2006 06:05 AM   CHOLHDL 8.7 09/07/2006 06:05 AM   LDLCALC (H) 09/07/2006 06:05 AM    180        Total Cholesterol/HDL:CHD Risk Coronary Heart Disease Risk Table  Men   Women  1/2 Average Risk   3.4   3.3    Wt Readings from Last 3 Encounters:  06/18/18 125 lb (56.7 kg)  10/15/16 134 lb 12.8 oz (61.1 kg)  04/11/16 133 lb 11.2 oz (60.6 kg)     Objective:    Vital Signs:  Ht 5\' 5"  (1.651 m)   Wt 125 lb (56.7 kg)   BMI 20.80 kg/m     ASSESSMENT & PLAN:    1. Chest pain: This appears to have been an isolated instance.  When I see her in the office, I will obtain an ECG.  She underwent a low risk nuclear stress test on 05/11/2015.  If she has symptom recurrence, I would consider repeating this.  2. HTN: She takes triamterene-HCTZ 37.5-25 mg daily.   COVID-19 Education: The signs and symptoms of COVID-19 were discussed with the patient and how to seek care for testing (follow up with PCP or arrange E-visit).  The importance of social distancing was discussed today.  Time:   Today, I have spent 30 minutes with the patient with telehealth technology discussing the above problems.     Medication Adjustments/Labs and Tests Ordered: Current medicines are reviewed at length with the patient today.  Concerns regarding medicines are outlined above.   Tests Ordered: No orders of the defined types were placed in this encounter.   Medication Changes: No orders of the defined types were placed in this encounter.   Disposition:  Follow up August 2020  SignedPrentice Docker, MD  06/18/2018 1:55 PM    [facility]

## 2018-09-08 DIAGNOSIS — M79641 Pain in right hand: Secondary | ICD-10-CM | POA: Insufficient documentation

## 2018-09-22 DIAGNOSIS — M659 Synovitis and tenosynovitis, unspecified: Secondary | ICD-10-CM | POA: Insufficient documentation

## 2018-10-20 ENCOUNTER — Encounter: Payer: Self-pay | Admitting: Cardiovascular Disease

## 2018-10-20 ENCOUNTER — Telehealth (INDEPENDENT_AMBULATORY_CARE_PROVIDER_SITE_OTHER): Payer: Medicare Other | Admitting: Cardiovascular Disease

## 2018-10-20 VITALS — Ht 65.0 in | Wt 129.0 lb

## 2018-10-20 DIAGNOSIS — R079 Chest pain, unspecified: Secondary | ICD-10-CM | POA: Diagnosis not present

## 2018-10-20 DIAGNOSIS — M25579 Pain in unspecified ankle and joints of unspecified foot: Secondary | ICD-10-CM

## 2018-10-20 DIAGNOSIS — E119 Type 2 diabetes mellitus without complications: Secondary | ICD-10-CM

## 2018-10-20 DIAGNOSIS — I10 Essential (primary) hypertension: Secondary | ICD-10-CM

## 2018-10-20 NOTE — Progress Notes (Signed)
Virtual Visit via Telephone Note   This visit type was conducted due to national recommendations for restrictions regarding the COVID-19 Pandemic (e.g. social distancing) in an effort to limit this patient's exposure and mitigate transmission in our community.  Due to her co-morbid illnesses, this patient is at least at moderate risk for complications without adequate follow up.  This format is felt to be most appropriate for this patient at this time.  The patient did not have access to video technology/had technical difficulties with video requiring transitioning to audio format only (telephone).  All issues noted in this document were discussed and addressed.  No physical exam could be performed with this format.  Please refer to the patient's chart for her  consent to telehealth for [facility].   Date:  10/20/2018   ID:  [name] CroftVirginia G [name], DOB 03/19/1945, MRN [id number]  Patient Location: Home Provider Location: Office  PCP:  Andi DevonShelton, Kimberly, MD  Cardiologist:  Prentice DockerSuresh Koneswaran, MD  Electrophysiologist:  None   Evaluation Performed:  Follow-Up Visit  Chief Complaint:  Chest pain  History of Present Illness:    [name] is a 73 y.o. female with chest pain, hypertension, and hyperlipidemia.  She has a h/o carotidynia. She was seen by Dr. Myra GianottiBrabham (vascular surgery). She had a negative temporal artery biopsy. [facility] and ophthalmology.  She has severe mid thoracic back pain for which she takes Percocet prn.  She has been awoken at night with left ankle pain and seldom right ankle pain. The pain is so bad it takes her breath away. It only happens when she's sleeping. She said her foot turns and "pulls" and sometimes she can't stand on it and make it flat to relieve the pain. She has diabetes and examines her feet regularly. She does have some mild neuropathy.  She denies calf cramping and swelling. She denies claudication pain.   They are having an issue with their roof leaking.  She's had some issues with her right hand swelling and sees a Careers advisersurgeon.  She hasn't had any significant chest pain. She is under a lot of stress as she is her husband's primary caregiver and he has advanced dementia. She hasn't been very active outdoors due to COVID.  The patient does not have symptoms concerning for COVID-19 infection (fever, chills, cough, or new shortness of breath).    Social History: Her sister is my patient, Administrator, Civil Serviceusselene Disher IT sales professional([employer]). Her husband, Greggory StallionGeorge, is also my patient. Her brother in law, Maurine SimmeringDavid Disher, is also my patient. They would have been married 51 yrs in December 2020. Son and youngest daughter live in [place]. Their middle daughter lives in [place] and is a Engineer, civil (consulting)nurse.   Past Medical History:  Diagnosis Date  . Arthritis    "little bit qwhere" (01/24/2012)  . Asthma   . Chronic back pain    "neck to tailbone" (01/24/2012)  . Chronic bronchitis (HCC)    "last time ~ 1 month ago" (01/24/2012)  . COPD (chronic obstructive pulmonary disease) (HCC)   . Depression    takes Zoloft daily  . Fibromyalgia   . History of bladder infections    sees a urologist--on long term Macrodantin nightly  . History of blood transfusion    as a child  . History of blood transfusion    "when I was a child" (01/24/2012)  . Hyperlipidemia    takes Simvastatin daily  . Hypertension    takes Maxzide daily  .  Hypothyroidism    takes Synthroid daily  . Insomnia    takes Ambien nightly  . Lupus (HCC)   . Neck pain    herniated disc and radiculopathy  . Peripheral neuropathy   . Pneumonia 2012   "have had it a couple times" (01/24/2012)  . Smokers' cough (HCC)    has albuterol prn  . Type II diabetes mellitus (HCC)    takes Janumet daily  . Urinary frequency    Past Surgical History:  Procedure Laterality Date  . ANTERIOR CERVICAL DECOMP/DISCECTOMY FUSION  2002; 01/24/2012  . ANTERIOR CERVICAL  DECOMP/DISCECTOMY FUSION  01/24/2012   Procedure: ANTERIOR CERVICAL DECOMPRESSION/DISCECTOMY FUSION 1 LEVEL;  Surgeon: Cristi LoronJeffrey D Jenkins, MD;  Location: [facility];  Service: Neurosurgery;  Laterality: N/A;  CERVICAL THREE-FOUR anterior cervical decompression with fusion interbody prothesis plating and bonegraft  . APPENDECTOMY  1960's  . BACK SURGERY  2001  . BLADDER SURGERY  1977   through abdomen  . BLADDER SUSPENSION  1974  . BUNIONECTOMY  1980's   left  . CARDIAC CATHETERIZATION  2008  . COLONOSCOPY    . ESOPHAGOGASTRODUODENOSCOPY    . LUMBAR FUSION  2009  . RECTOCELE REPAIR  2000  . VAGINAL HYSTERECTOMY  1974     Current Meds  Medication Sig  . levothyroxine (SYNTHROID, LEVOTHROID) 50 MCG tablet Take 50 mcg by mouth daily before breakfast.  . metFORMIN (GLUCOPHAGE) 1000 MG tablet Take 1,000 mg by mouth daily.   . metoCLOPramide (REGLAN) 5 MG tablet Take 5 mg by mouth 2 (two) times daily.  . nitroGLYCERIN (NITROSTAT) 0.4 MG SL tablet Place 1 tablet (0.4 mg total) under the tongue every 5 (five) minutes x 3 doses as needed for chest pain. If no relief after 3rd dose, proceed to the ED for an evaluation  . oxyCODONE-acetaminophen (PERCOCET) 10-325 MG tablet Take 0.5 tablets by mouth every 4 (four) hours as needed for pain.  Marland Kitchen. sertraline (ZOLOFT) 100 MG tablet Take 100 mg by mouth daily.   Marland Kitchen. zolpidem (AMBIEN) 5 MG tablet Take 5 mg by mouth at bedtime. For sleep  . [DISCONTINUED] triamterene-hydrochlorothiazide (MAXZIDE-25) 37.5-25 MG per tablet Take 1 tablet by mouth daily.     Allergies:   Erythromycin, Penicillins, Gadolinium derivatives, Iodinated diagnostic agents, Latex, Morphine, Other, Prednisone, Sulfonamide derivatives, Varenicline tartrate, Atorvastatin, and Aspirin   Social History   Tobacco Use  . Smoking status: Current Some Day Smoker    Packs/day: 0.50    Years: 60.00    Pack years: 30.00    Types: Cigarettes  . Smokeless tobacco: Current User  . Tobacco  comment: vapor ciggs  Substance Use Topics  . Alcohol use: No    Alcohol/week: 0.0 standard drinks  . Drug use: No     Family Hx: The patient's family history includes Arrhythmia in her father; Diabetes in her brother; Heart attack in her brother; Heart disease in her mother and sister.  ROS:   Please see the history of present illness.     All other systems reviewed and are negative.   Prior CV studies:   The following studies were reviewed today:  NA  Labs/Other Tests and Data Reviewed:    EKG:  No ECG reviewed.  Recent Labs: No results found for requested labs within last 8760 hours.   Recent Lipid Panel Lab Results  Component Value Date/Time   CHOL (H) 09/07/2006 06:05 AM    271        ATP III CLASSIFICATION:  <  200     mg/dL   Desirable  200-239  mg/dL   Borderline High  >=240    mg/dL   High   TRIG 300 (H) 09/07/2006 06:05 AM   HDL 31 (L) 09/07/2006 06:05 AM   CHOLHDL 8.7 09/07/2006 06:05 AM   LDLCALC (H) 09/07/2006 06:05 AM    180        Total Cholesterol/HDL:CHD Risk Coronary Heart Disease Risk Table                     Men   Women  1/2 Average Risk   3.4   3.3    Wt Readings from Last 3 Encounters:  10/20/18 129 lb (58.5 kg)  06/18/18 125 lb (56.7 kg)  10/15/16 134 lb 12.8 oz (61.1 kg)     Objective:    Vital Signs:  Ht 5\' 5"  (1.651 m)   Wt 129 lb (58.5 kg)   BMI 21.47 kg/m    VITAL SIGNS:  reviewed  ASSESSMENT & PLAN:    1. Chest pain: This appears to have been an isolated instance.  When I see her in the office, I will obtain an ECG.  She underwent a low risk nuclear stress test on 05/11/2015.  If she has symptom recurrence, I would consider repeating this.  2. HTN: She takes triamterene-HCTZ 37.5-25 mg daily.  3. Bilateral ankle and feet pain: Unclear etiology. Has only mild neuropathy. Does have chronic lower back pain and I wonder if it is neuropathic in etiology.  4. Type 2 diabetes: Could not tolerate statins. She takes metformin.    COVID-19 Education: The signs and symptoms of COVID-19 were discussed with the patient and how to seek care for testing (follow up with PCP or arrange E-visit).  The importance of social distancing was discussed today.  Time:   Today, I have spent 15 minutes with the patient with telehealth technology discussing the above problems.     Medication Adjustments/Labs and Tests Ordered: Current medicines are reviewed at length with the patient today.  Concerns regarding medicines are outlined above.   Tests Ordered: No orders of the defined types were placed in this encounter.   Medication Changes: No orders of the defined types were placed in this encounter.   Follow Up:  Virtual Visit or In Person in 3 month(s)  Signed, Kate Sable, MD  10/20/2018 10:59 AM    [place]

## 2018-10-20 NOTE — Patient Instructions (Signed)
Your physician wants you to follow-up in: [place]  Your physician recommends that you continue on your current medications as directed. Please refer to the Current Medication list given to you today.  Thank you for choosing [facility]!!

## 2019-01-09 DIAGNOSIS — Z4789 Encounter for other orthopedic aftercare: Secondary | ICD-10-CM | POA: Insufficient documentation

## 2019-01-26 ENCOUNTER — Other Ambulatory Visit: Payer: Self-pay

## 2019-01-26 ENCOUNTER — Encounter: Payer: Self-pay | Admitting: Cardiovascular Disease

## 2019-01-26 ENCOUNTER — Ambulatory Visit (INDEPENDENT_AMBULATORY_CARE_PROVIDER_SITE_OTHER): Payer: Medicare Other | Admitting: Cardiovascular Disease

## 2019-01-26 VITALS — BP 118/64 | HR 73 | Ht 65.0 in | Wt 129.0 lb

## 2019-01-26 DIAGNOSIS — M25579 Pain in unspecified ankle and joints of unspecified foot: Secondary | ICD-10-CM

## 2019-01-26 DIAGNOSIS — R079 Chest pain, unspecified: Secondary | ICD-10-CM | POA: Diagnosis not present

## 2019-01-26 DIAGNOSIS — I10 Essential (primary) hypertension: Secondary | ICD-10-CM

## 2019-01-26 DIAGNOSIS — E119 Type 2 diabetes mellitus without complications: Secondary | ICD-10-CM | POA: Diagnosis not present

## 2019-01-26 NOTE — Patient Instructions (Signed)

## 2019-01-26 NOTE — Progress Notes (Signed)
SUBJECTIVE: [name] is a 73 y.o. female with chest pain, hypertension, and hyperlipidemia.  She has a h/o carotidynia. She was seen by Dr. Myra Gianotti (vascular surgery). She had a negative temporal artery biopsy. She was evaluated at [facility] by rheumatology and ophthalmology.  She has severe mid thoracic back pain for which she takes Percocet prn.  I personally reviewed the ECG performed today which demonstrates sinus rhythm with nonspecific T wave abnormalities.  She denies chest pain and palpitations.  She has COPD and chronic exertional dyspnea related to this.  She smokes a pack of cigarettes daily.  She recently underwent right wrist and hand surgery by Dr. [name].  She continues to experience pain in the lateral aspect of the left ankle leading to subsequent cramping in her legs and feet.  This occurs primarily when she is sleeping and awakes her from sleep.  She denies claudication pain.   Social History: Her sister is my patient, Administrator, Civil Service IT sales professional). Her husband, Greggory Stallion, is also my patient. Her brother in law, Maurine Simmering, is also my patient. They would have been married 51 yrs in December 2020. Son and youngest daughter live in [place]. Their middle daughter lives in [place] and is a Engineer, civil (consulting).  Review of Systems: As per "subjective", otherwise negative.  Allergies  Allergen Reactions  . Erythromycin Anaphylaxis  . Penicillins Anaphylaxis  . Gadolinium Derivatives Hives and Rash    Pt broke out in hives all over body.  Has IV steroids and Benadryl in hospital for MRI.  Marland Kitchen Iodinated Diagnostic Agents Hives    13-hour prep Pt had slight itching of her abd after her injection and had taken her 13 hr prep.  Consult with radiologist before another procedure to see if IV contrast is necessary, per Dr Roswell Nickel on 01/06/14, JB/  . Latex Dermatitis and Other (See Comments)    blisters  . Morphine Nausea And Vomiting  . Other Itching and Other (See Comments)     Chlorhexin (CHG); "burning"  . Prednisone Other (See Comments)    "shakes me out of my frame" IM steroids aren't as bad as PO  . Sulfonamide Derivatives Itching and Rash    All over body   . Varenicline Tartrate Nausea And Vomiting       . Atorvastatin   . Aspirin Nausea Only and Other (See Comments)    Burning in stomach     Current Outpatient Medications  Medication Sig Dispense Refill  . HYDROcodone-acetaminophen (NORCO/VICODIN) 5-325 MG tablet Take 1 tablet by mouth every 6 (six) hours as needed for moderate pain.    Marland Kitchen levothyroxine (SYNTHROID, LEVOTHROID) 50 MCG tablet Take 50 mcg by mouth daily before breakfast.    . metFORMIN (GLUCOPHAGE) 1000 MG tablet Take 1,000 mg by mouth daily.     . metoCLOPramide (REGLAN) 5 MG tablet Take 5 mg by mouth daily.     . nitroGLYCERIN (NITROSTAT) 0.4 MG SL tablet Place 1 tablet (0.4 mg total) under the tongue every 5 (five) minutes x 3 doses as needed for chest pain. If no relief after 3rd dose, proceed to the ED for an evaluation 25 tablet 3  . sertraline (ZOLOFT) 100 MG tablet Take 100 mg by mouth daily.     Marland Kitchen triamterene-hydrochlorothiazide (MAXZIDE-25) 37.5-25 MG tablet Take 1 tablet by mouth daily.    Marland Kitchen zolpidem (AMBIEN) 5 MG tablet Take 5 mg by mouth at bedtime. For sleep     No current facility-administered medications for  this visit.     Past Medical History:  Diagnosis Date  . Arthritis    "little bit qwhere" (01/24/2012)  . Asthma   . Chronic back pain    "neck to tailbone" (01/24/2012)  . Chronic bronchitis (HCC)    "last time ~ 1 month ago" (01/24/2012)  . COPD (chronic obstructive pulmonary disease) (HCC)   . Depression    takes Zoloft daily  . Fibromyalgia   . History of bladder infections    sees a urologist--on long term Macrodantin nightly  . History of blood transfusion    as a child  . History of blood transfusion    "when I was a child" (01/24/2012)  . Hyperlipidemia    takes Simvastatin daily  .  Hypertension    takes Maxzide daily  . Hypothyroidism    takes Synthroid daily  . Insomnia    takes Ambien nightly  . Lupus (HCC)   . Neck pain    herniated disc and radiculopathy  . Peripheral neuropathy   . Pneumonia 2012   "have had it a couple times" (01/24/2012)  . Smokers' cough (HCC)    has albuterol prn  . Type II diabetes mellitus (HCC)    takes Janumet daily  . Urinary frequency     Past Surgical History:  Procedure Laterality Date  . ANTERIOR CERVICAL DECOMP/DISCECTOMY FUSION  2002; 01/24/2012  . ANTERIOR CERVICAL DECOMP/DISCECTOMY FUSION  01/24/2012   Procedure: ANTERIOR CERVICAL DECOMPRESSION/DISCECTOMY FUSION 1 LEVEL;  Surgeon: Cristi LoronJeffrey D Jenkins, MD;  Location: [facility];  Service: Neurosurgery;  Laterality: N/A;  CERVICAL THREE-FOUR anterior cervical decompression with fusion interbody prothesis plating and bonegraft  . APPENDECTOMY  1960's  . BACK SURGERY  2001  . BLADDER SURGERY  1977   through abdomen  . BLADDER SUSPENSION  1974  . BUNIONECTOMY  1980's   left  . CARDIAC CATHETERIZATION  2008  . COLONOSCOPY    . ESOPHAGOGASTRODUODENOSCOPY    . LUMBAR FUSION  2009  . RECTOCELE REPAIR  2000  . VAGINAL HYSTERECTOMY  1974    Social History   Socioeconomic History  . Marital status: Married    Spouse name: Not on file  . Number of children: Not on file  . Years of education: Not on file  . Highest education level: Not on file  Occupational History  . Not on file  Social Needs  . Financial resource strain: Not on file  . Food insecurity    Worry: Not on file    Inability: Not on file  . Transportation needs    Medical: Not on file    Non-medical: Not on file  Tobacco Use  . Smoking status: Current Some Day Smoker    Packs/day: 0.50    Years: 60.00    Pack years: 30.00    Types: Cigarettes  . Smokeless tobacco: Current User  . Tobacco comment: vapor ciggs  Substance and Sexual Activity  . Alcohol use: No    Alcohol/week: 0.0 standard  drinks  . Drug use: No  . Sexual activity: Never  Lifestyle  . Physical activity    Days per week: Not on file    Minutes per session: Not on file  . Stress: Not on file  Relationships  . Social Musicianconnections    Talks on phone: Not on file    Gets together: Not on file    Attends religious service: Not on file    Active member of club or organization: Not on file  Attends meetings of clubs or organizations: Not on file    Relationship status: Not on file  . Intimate partner violence    Fear of current or ex partner: Not on file    Emotionally abused: Not on file    Physically abused: Not on file    Forced sexual activity: Not on file  Other Topics Concern  . Not on file  Social History Narrative  . Not on file     Vitals:   01/26/19 1459  BP: 118/64  Pulse: 73  SpO2: 97%  Weight: 129 lb (58.5 kg)  Height: 5\' 5"  (1.651 m)    Wt Readings from Last 3 Encounters:  01/26/19 129 lb (58.5 kg)  10/20/18 129 lb (58.5 kg)  06/18/18 125 lb (56.7 kg)     PHYSICAL EXAM General: NAD HEENT: Normal. Neck: No JVD, no thyromegaly. Lungs: Poor air movement with bilateral expiratory wheezes/rhonchi. CV: Regular rate and rhythm, normal S1/S2, no S3/S4, no murmur. No pretibial or periankle edema.  No carotid bruit.   Abdomen: Soft, nontender, no distention.  Neurologic: Alert and oriented.  Psych: Normal affect. Skin: Normal. Musculoskeletal: No gross deformities.      Labs:    Lipids: Lab Results  Component Value Date/Time   LDLCALC (H) 09/07/2006 06:05 AM    180        Total Cholesterol/HDL:CHD Risk Coronary Heart Disease Risk Table                     Men   Women  1/2 Average Risk   3.4   3.3   CHOL (H) 09/07/2006 06:05 AM    271        ATP III CLASSIFICATION:  <200     mg/dL   Desirable  200-239  mg/dL   Borderline High  >=240    mg/dL   High   TRIG 300 (H) 09/07/2006 06:05 AM   HDL 31 (L) 09/07/2006 06:05 AM       ASSESSMENT AND PLAN: 1. Chest  pain:This appears to have been an isolated instance.  No recurrences thus far. She underwent a low risk nuclear stress test on 05/11/2015. If she has symptom recurrence, I would consider repeating this.  2. HTN:She takes triamterene-HCTZ 37.5-25 mg daily.  BP is normal.  No changes.  3. Bilateral ankle and feet pain: Unclear etiology. Has only mild neuropathy. Does have chronic lower back pain and I wonder if it is neuropathic in etiology.  She also has a history of chronic inflammation.  4. Type 2 diabetes: Could not tolerate statins. She takes metformin.    Disposition: Follow up 6 months   Kate Sable, M.D., F.A.C.C.

## 2019-04-07 ENCOUNTER — Other Ambulatory Visit: Payer: Self-pay | Admitting: Internal Medicine

## 2019-04-10 ENCOUNTER — Other Ambulatory Visit: Payer: Self-pay | Admitting: Internal Medicine

## 2019-04-13 ENCOUNTER — Other Ambulatory Visit: Payer: Self-pay | Admitting: Internal Medicine

## 2019-04-13 DIAGNOSIS — R102 Pelvic and perineal pain: Secondary | ICD-10-CM

## 2019-04-17 ENCOUNTER — Ambulatory Visit
Admission: RE | Admit: 2019-04-17 | Discharge: 2019-04-17 | Disposition: A | Discharge status: Home / Self Care | Payer: Medicare Other | Source: Ambulatory Visit | Attending: Internal Medicine | Admitting: Internal Medicine

## 2019-04-17 DIAGNOSIS — R102 Pelvic and perineal pain: Secondary | ICD-10-CM

## 2019-04-29 ENCOUNTER — Other Ambulatory Visit: Payer: Self-pay | Admitting: Unknown Physician Specialty

## 2019-04-29 ENCOUNTER — Telehealth: Payer: Self-pay | Admitting: Nurse Practitioner

## 2019-04-29 ENCOUNTER — Other Ambulatory Visit: Payer: Self-pay | Admitting: Nurse Practitioner

## 2019-04-29 DIAGNOSIS — U071 COVID-19: Secondary | ICD-10-CM

## 2019-04-29 DIAGNOSIS — I10 Essential (primary) hypertension: Secondary | ICD-10-CM

## 2019-04-29 DIAGNOSIS — E119 Type 2 diabetes mellitus without complications: Secondary | ICD-10-CM

## 2019-04-29 NOTE — Telephone Encounter (Signed)
  I connected by phone with [name] on 04/29/2019 at 7:23 PM to discuss the potential use of an new treatment for mild to moderate COVID-19 viral infection in non-hospitalized patients.  This patient is a 74 y.o. female that meets the FDA criteria for Emergency Use Authorization of bamlanivimab or casirivimab\imdevimab.  Has a (+) direct SARS-CoV-2 viral test result  Has mild or moderate COVID-19   Is ? 74 years of age and weighs ? 40 kg  Is NOT hospitalized due to COVID-19  Is NOT requiring oxygen therapy or requiring an increase in baseline oxygen flow rate due to COVID-19  Is within 10 days of symptom onset  Has at least one of the high risk factor(s) for progression to severe COVID-19 and/or hospitalization as defined in EUA.  Specific high risk criteria : HTN, DM   I have spoken and communicated the following to the patient or parent/caregiver:  1. FDA has authorized the emergency use of bamlanivimab and casirivimab\imdevimab for the treatment of mild to moderate COVID-19 in adults and pediatric patients with positive results of direct SARS-CoV-2 viral testing who are 75 years of age and older weighing at least 40 kg, and who are at high risk for progressing to severe COVID-19 and/or hospitalization.  2. The significant known and potential risks and benefits of bamlanivimab and casirivimab\imdevimab, and the extent to which such potential risks and benefits are unknown.  3. Information on available alternative treatments and the risks and benefits of those alternatives, including clinical trials.  4. Patients treated with bamlanivimab and casirivimab\imdevimab should continue to self-isolate and use infection control measures (e.g., wear mask, isolate, social distance, avoid sharing personal items, clean and disinfect "high touch" surfaces, and frequent handwashing) according to CDC guidelines.   5. The patient or parent/caregiver has the option to accept or refuse  bamlanivimab or casirivimab\imdevimab .  After reviewing this information with the patient, Agreed.  [name] [facility] 04/29/2019 7:23 PM

## 2019-04-30 ENCOUNTER — Ambulatory Visit (HOSPITAL_COMMUNITY)
Admission: RE | Admit: 2019-04-30 | Discharge: 2019-04-30 | Disposition: A | Discharge status: Home / Self Care | Payer: Medicare Other | Source: Ambulatory Visit | Attending: Pulmonary Disease | Admitting: Pulmonary Disease

## 2019-04-30 DIAGNOSIS — U071 COVID-19: Secondary | ICD-10-CM | POA: Insufficient documentation

## 2019-04-30 DIAGNOSIS — Z23 Encounter for immunization: Secondary | ICD-10-CM | POA: Insufficient documentation

## 2019-04-30 DIAGNOSIS — E118 Type 2 diabetes mellitus with unspecified complications: Secondary | ICD-10-CM | POA: Diagnosis not present

## 2019-04-30 MED ORDER — EPINEPHRINE 0.3 MG/0.3ML IJ SOAJ: 0.3000 mg | Freq: Once | INTRAMUSCULAR | Status: DC | PRN

## 2019-04-30 MED ORDER — METHYLPREDNISOLONE SODIUM SUCC 125 MG IJ SOLR: 125.0000 mg | Freq: Once | INTRAMUSCULAR | Status: DC | PRN

## 2019-04-30 MED ORDER — FAMOTIDINE IN NACL 20-0.9 MG/50ML-% IV SOLN: 20.0000 mg | Freq: Once | INTRAVENOUS | Status: DC | PRN

## 2019-04-30 MED ORDER — ALBUTEROL SULFATE HFA 108 (90 BASE) MCG/ACT IN AERS: 2.0000 | INHALATION_SPRAY | Freq: Once | RESPIRATORY_TRACT | Status: DC | PRN

## 2019-04-30 MED ORDER — SODIUM CHLORIDE 0.9 % IV SOLN
INTRAVENOUS | Status: DC | PRN
  Administered 2019-04-30: 250 mL via INTRAVENOUS

## 2019-04-30 MED ORDER — DIPHENHYDRAMINE HCL 50 MG/ML IJ SOLN: 50.0000 mg | Freq: Once | INTRAMUSCULAR | Status: DC | PRN

## 2019-04-30 MED ORDER — SODIUM CHLORIDE 0.9 % IV SOLN
700.0000 mg | Freq: Once | INTRAVENOUS | Status: AC
  Administered 2019-04-30: 700 mg via INTRAVENOUS
  Filled 2019-04-30: qty 20

## 2019-04-30 NOTE — Discharge Instructions (Signed)
COVID-19 COVID-19 is a respiratory infection that is caused by a virus called severe acute respiratory syndrome coronavirus 2 (SARS-CoV-2). The disease is also known as coronavirus disease or novel coronavirus. In some people, the virus may not cause any symptoms. In others, it may cause a serious infection. The infection can get worse quickly and can lead to complications, such as:  Pneumonia, or infection of the lungs.  Acute respiratory distress syndrome or ARDS. This is a condition in which fluid build-up in the lungs prevents the lungs from filling with air and passing oxygen into the blood.  Acute respiratory failure. This is a condition in which there is not enough oxygen passing from the lungs to the body or when carbon dioxide is not passing from the lungs out of the body.  Sepsis or septic shock. This is a serious bodily reaction to an infection.  Blood clotting problems.  Secondary infections due to bacteria or fungus.  Organ failure. This is when your body's organs stop working. The virus that causes COVID-19 is contagious. This means that it can spread from person to person through droplets from coughs and sneezes (respiratory secretions). What are the causes? This illness is caused by a virus. You may catch the virus by:  Breathing in droplets from an infected person. Droplets can be spread by a person breathing, speaking, singing, coughing, or sneezing.  Touching something, like a table or a doorknob, that was exposed to the virus (contaminated) and then touching your mouth, nose, or eyes. What increases the risk? Risk for infection You are more likely to be infected with this virus if you:  Are within 6 feet (2 meters) of a person with COVID-19.  Provide care for or live with a person who is infected with COVID-19.  Spend time in crowded indoor spaces or live in shared housing. Risk for serious illness You are more likely to become seriously ill from the virus if  you:  Are 50 years of age or older. The higher your age, the more you are at risk for serious illness.  Live in a nursing home or long-term care facility.  Have cancer.  Have a long-term (chronic) disease such as: ? Chronic lung disease, including chronic obstructive pulmonary disease or asthma. ? A long-term disease that lowers your body's ability to fight infection (immunocompromised). ? Heart disease, including heart failure, a condition in which the arteries that lead to the heart become narrow or blocked (coronary artery disease), a disease which makes the heart muscle thick, weak, or stiff (cardiomyopathy). ? Diabetes. ? Chronic kidney disease. ? Sickle cell disease, a condition in which red blood cells have an abnormal "sickle" shape. ? Liver disease.  Are obese. What are the signs or symptoms? Symptoms of this condition can range from mild to severe. Symptoms may appear any time from 2 to 14 days after being exposed to the virus. They include:  A fever or chills.  A cough.  Difficulty breathing.  Headaches, body aches, or muscle aches.  Runny or stuffy (congested) nose.  A sore throat.  New loss of taste or smell. Some people may also have stomach problems, such as nausea, vomiting, or diarrhea. Other people may not have any symptoms of COVID-19. How is this diagnosed? This condition may be diagnosed based on:  Your signs and symptoms, especially if: ? You live in an area with a COVID-19 outbreak. ? You recently traveled to or from an area where the virus is common. ? You   provide care for or live with a person who was diagnosed with COVID-19. ? You were exposed to a person who was diagnosed with COVID-19.  A physical exam.  Lab tests, which may include: ? Taking a sample of fluid from the back of your nose and throat (nasopharyngeal fluid), your nose, or your throat using a swab. ? A sample of mucus from your lungs (sputum). ? Blood tests.  Imaging tests,  which may include, X-rays, CT scan, or ultrasound. How is this treated? At present, there is no medicine to treat COVID-19. Medicines that treat other diseases are being used on a trial basis to see if they are effective against COVID-19. Your health care provider will talk with you about ways to treat your symptoms. For most people, the infection is mild and can be managed at home with rest, fluids, and over-the-counter medicines. Treatment for a serious infection usually takes places in a hospital intensive care unit (ICU). It may include one or more of the following treatments. These treatments are given until your symptoms improve.  Receiving fluids and medicines through an IV.  Supplemental oxygen. Extra oxygen is given through a tube in the nose, a face mask, or a hood.  Positioning you to lie on your stomach (prone position). This makes it easier for oxygen to get into the lungs.  Continuous positive airway pressure (CPAP) or bi-level positive airway pressure (BPAP) machine. This treatment uses mild air pressure to keep the airways open. A tube that is connected to a motor delivers oxygen to the body.  Ventilator. This treatment moves air into and out of the lungs by using a tube that is placed in your windpipe.  Tracheostomy. This is a procedure to create a hole in the neck so that a breathing tube can be inserted.  Extracorporeal membrane oxygenation (ECMO). This procedure gives the lungs a chance to recover by taking over the functions of the heart and lungs. It supplies oxygen to the body and removes carbon dioxide. Follow these instructions at home: Lifestyle  If you are sick, stay home except to get medical care. Your health care provider will tell you how long to stay home. Call your health care provider before you go for medical care.  Rest at home as told by your health care provider.  Do not use any products that contain nicotine or tobacco, such as cigarettes,  e-cigarettes, and chewing tobacco. If you need help quitting, ask your health care provider.  Return to your normal activities as told by your health care provider. Ask your health care provider what activities are safe for you. General instructions  Take over-the-counter and prescription medicines only as told by your health care provider.  Drink enough fluid to keep your urine pale yellow.  Keep all follow-up visits as told by your health care provider. This is important. How is this prevented?  There is no vaccine to help prevent COVID-19 infection. However, there are steps you can take to protect yourself and others from this virus. To protect yourself:   Do not travel to areas where COVID-19 is a risk. The areas where COVID-19 is reported change often. To identify high-risk areas and travel restrictions, check the CDC travel website: wwwnc.cdc.gov/travel/notices  If you live in, or must travel to, an area where COVID-19 is a risk, take precautions to avoid infection. ? Stay away from people who are sick. ? Wash your hands often with soap and water for 20 seconds. If soap and water   are not available, use an alcohol-based hand sanitizer. ? Avoid touching your mouth, face, eyes, or nose. ? Avoid going out in public, follow guidance from your state and local health authorities. ? If you must go out in public, wear a cloth face covering or face mask. Make sure your mask covers your nose and mouth. ? Avoid crowded indoor spaces. Stay at least 6 feet (2 meters) away from others. ? Disinfect objects and surfaces that are frequently touched every day. This may include:  Counters and tables.  Doorknobs and light switches.  Sinks and faucets.  Electronics, such as phones, remote controls, keyboards, computers, and tablets. To protect others: If you have symptoms of COVID-19, take steps to prevent the virus from spreading to others.  If you think you have a COVID-19 infection, contact  your health care provider right away. Tell your health care team that you think you may have a COVID-19 infection.  Stay home. Leave your house only to seek medical care. Do not use public transport.  Do not travel while you are sick.  Wash your hands often with soap and water for 20 seconds. If soap and water are not available, use alcohol-based hand sanitizer.  Stay away from other members of your household. Let healthy household members care for children and pets, if possible. If you have to care for children or pets, wash your hands often and wear a mask. If possible, stay in your own room, separate from others. Use a different bathroom.  Make sure that all people in your household wash their hands well and often.  Cough or sneeze into a tissue or your sleeve or elbow. Do not cough or sneeze into your hand or into the air.  Wear a cloth face covering or face mask. Make sure your mask covers your nose and mouth. Where to find more information  Centers for Disease Control and Prevention: www.cdc.gov/coronavirus/2019-ncov/index.html  World Health Organization: www.who.int/health-topics/coronavirus Contact a health care provider if:  You live in or have traveled to an area where COVID-19 is a risk and you have symptoms of the infection.  You have had contact with someone who has COVID-19 and you have symptoms of the infection. Get help right away if:  You have trouble breathing.  You have pain or pressure in your chest.  You have confusion.  You have bluish lips and fingernails.  You have difficulty waking from sleep.  You have symptoms that get worse. These symptoms may represent a serious problem that is an emergency. Do not wait to see if the symptoms will go away. Get medical help right away. Call your local emergency services (911 in the [place] Do not drive yourself to the hospital. Let the emergency medical personnel know if you think you have  COVID-19. Summary  COVID-19 is a respiratory infection that is caused by a virus. It is also known as coronavirus disease or novel coronavirus. It can cause serious infections, such as pneumonia, acute respiratory distress syndrome, acute respiratory failure, or sepsis.  The virus that causes COVID-19 is contagious. This means that it can spread from person to person through droplets from breathing, speaking, singing, coughing, or sneezing.  You are more likely to develop a serious illness if you are 50 years of age or older, have a weak immune system, live in a nursing home, or have chronic disease.  There is no medicine to treat COVID-19. Your health care provider will talk with you about ways to treat your symptoms.    Take steps to protect yourself and others from infection. Wash your hands often and disinfect objects and surfaces that are frequently touched every day. Stay away from people who are sick and wear a mask if you are sick. This information is not intended to replace advice given to you by your health care provider. Make sure you discuss any questions you have with your health care provider. Document Revised: 12/19/2018 Document Reviewed: 03/27/2018 Elsevier Patient Education  2020 Elsevier Inc. What types of side effects do monoclonal antibody drugs cause?  Common side effects  In general, the more common side effects caused by monoclonal antibody drugs include: . Allergic reactions, such as hives or itching . Flu-like signs and symptoms, including chills, fatigue, fever, and muscle aches and pains . Nausea, vomiting . Diarrhea . Skin rashes . Low blood pressure   The CDC is recommending patients who receive monoclonal antibody treatments wait at least 90 days before being vaccinated.  Currently, there are no data on the safety and efficacy of mRNA COVID-19 vaccines in persons who received monoclonal antibodies or convalescent plasma as part of COVID-19 treatment. Based  on the estimated half-life of such therapies as well as evidence suggesting that reinfection is uncommon in the 90 days after initial infection, vaccination should be deferred for at least 90 days, as a precautionary measure until additional information becomes available, to avoid interference of the antibody treatment with vaccine-induced immune responses. 

## 2019-04-30 NOTE — Progress Notes (Signed)
Patient ID: [name], female   DOB: 1945/05/21, 74 y.o.   MRN: [id number]  Diagnosis: COVID-19  Physician:dr wright  Procedure: [facility] Med: bamlanivimab infusion - Provided patient with bamlanimivab fact sheet for patients, parents and caregivers prior to infusion.  Complications: No immediate complications noted.  Discharge: Discharged home   [name] 04/30/2019

## 2019-05-21 NOTE — Progress Notes (Deleted)
Office Visit Note  Patient: [name]             Date of Birth: 1945/08/12           MRN: [id number]             PCP: Willey Blade, MD Referring: Roseanne Kaufman, MD Visit Date: 05/28/2019 Occupation: @GUAROCC @  Subjective:  No chief complaint on file.   History of Present Illness: [name] is a 74 y.o. female ***   Activities of Daily Living:  Patient reports morning stiffness for *** {minute/hour:19697}.   Patient {ACTIONS;DENIES/REPORTS:21021675::"Denies"} nocturnal pain.  Difficulty dressing/grooming: {ACTIONS;DENIES/REPORTS:21021675::"Denies"} Difficulty climbing stairs: {ACTIONS;DENIES/REPORTS:21021675::"Denies"} Difficulty getting out of chair: {ACTIONS;DENIES/REPORTS:21021675::"Denies"} Difficulty using hands for taps, buttons, cutlery, and/or writing: {ACTIONS;DENIES/REPORTS:21021675::"Denies"}  No Rheumatology ROS completed.   PMFS History:  Patient Active Problem List   Diagnosis Date Noted  . Leg pain, left 07/27/2014  . Cervical spondylosis with radiculopathy 01/24/2012  . EMPHYSEMA 08/05/2008  . LEUKOCYTOSIS 06/25/2008  . COPD 06/25/2008  . GASTROPARESIS 06/25/2008  . DM 06/14/2008  . HYPERLIPIDEMIA 06/14/2008  . Essential hypertension 06/14/2008  . ASTHMATIC BRONCHITIS, ACUTE 06/14/2008  . ALLERGIC RHINITIS 06/14/2008  . LUPUS 06/14/2008    Past Medical History:  Diagnosis Date  . Arthritis    "little bit qwhere" (01/24/2012)  . Asthma   . Chronic back pain    "neck to tailbone" (01/24/2012)  . Chronic bronchitis ([place])    "last time ~ 1 month ago" (01/24/2012)  . COPD (chronic obstructive pulmonary disease) ([place])   . Depression    takes Zoloft daily  . Fibromyalgia   . History of bladder infections    sees a urologist--on long term Macrodantin nightly  . History of blood transfusion    as a child  . History of blood transfusion    "when I was a child" (01/24/2012)  . Hyperlipidemia    takes Simvastatin daily    . Hypertension    takes Maxzide daily  . Hypothyroidism    takes Synthroid daily  . Insomnia    takes Ambien nightly  . Lupus ([place])   . Neck pain    herniated disc and radiculopathy  . Peripheral neuropathy   . Pneumonia 2012   "have had it a couple times" (01/24/2012)  . Smokers' cough (HCC)    has albuterol prn  . Type II diabetes mellitus (H[facility])    takes Janumet daily  . Urinary frequency     Family History  Problem Relation Age of Onset  . Heart disease Mother   . Arrhythmia Father   . Heart disease Sister   . Heart attack Brother   . Diabetes Brother    Past Surgical History:  Procedure Laterality Date  . ANTERIOR CERVICAL DECOMP/DISCECTOMY FUSION  2002; 01/24/2012  . ANTERIOR CERVICAL DECOMP/DISCECTOMY FUSION  01/24/2012   Procedure: ANTERIOR CERVICAL DECOMPRESSION/DISCECTOMY FUSION 1 LEVEL;  Surgeon: Ophelia Charter, MD;  Location: [facility];  Service: Neurosurgery;  Laterality: N/A;  CERVICAL THREE-FOUR anterior cervical decompression with fusion interbody prothesis plating and bonegraft  . APPENDECTOMY  1960's  . BACK SURGERY  2001  . BLADDER SURGERY  1977   through abdomen  . BLADDER SUSPENSION  1974  . BUNIONECTOMY  1980's   left  . CARDIAC CATHETERIZATION  2008  . COLONOSCOPY    . ESOPHAGOGASTRODUODENOSCOPY    . LUMBAR FUSION  2009  . RECTOCELE REPAIR  2000  . VAGINAL HYSTERECTOMY  1974   Social History  Social History Narrative  . Not on file   Immunization History  Administered Date(s) Administered  . Influenza Split 01/25/2012  . Influenza, High Dose Seasonal PF 01/01/2017  . Influenza-Unspecified 12/03/2012  . Pneumococcal Polysaccharide-23 01/25/2012     Objective: Vital Signs: There were no vitals taken for this visit.   Physical Exam   Musculoskeletal Exam: ***  CDAI Exam: CDAI Score: -- Patient Global: --; Provider Global: -- Swollen: --; Tender: -- Joint Exam 05/28/2019   No joint exam has been documented for this  visit   There is currently no information documented on the homunculus. Go to the Rheumatology activity and complete the homunculus joint exam.  Investigation: No additional findings.  Imaging: No results found.  Recent Labs: Lab Results  Component Value Date   WBC 14.8 (H) 10/08/2015   HGB 12.8 10/08/2015   PLT 288 10/08/2015   NA 134 (L) 10/08/2015   K 3.7 10/08/2015   CL 101 10/08/2015   CO2 25 10/08/2015   GLUCOSE 129 (H) 10/08/2015   BUN 14 10/08/2015   CREATININE 0.82 10/08/2015   BILITOT 0.5 01/12/2014   ALKPHOS 72 01/12/2014   AST 32 01/12/2014   ALT 32 01/12/2014   PROT 8.4 (H) 01/12/2014   ALBUMIN 4.2 01/12/2014   CALCIUM 8.5 (L) 10/08/2015   GFRAA >60 10/08/2015    Speciality Comments: No specialty comments available.  Procedures:  No procedures performed Allergies: Erythromycin, Penicillins, Gadolinium derivatives, Iodinated diagnostic agents, Latex, Morphine, Other, Prednisone, Sulfonamide derivatives, Varenicline tartrate, Atorvastatin, and Aspirin   Assessment / Plan:     Visit Diagnoses: No diagnosis found.  Orders: No orders of the defined types were placed in this encounter.  No orders of the defined types were placed in this encounter.   Face-to-face time spent with patient was *** minutes. Greater than 50% of time was spent in counseling and coordination of care.  Follow-Up Instructions: No follow-ups on file.   Gearldine Bienenstock, PA-C  Note - This record has been created using Dragon software.  Chart creation errors have been sought, but may not always  have been located. Such creation errors do not reflect on  the standard of medical care.

## 2019-05-28 ENCOUNTER — Ambulatory Visit: Payer: Medicare Other | Admitting: Rheumatology

## 2019-06-24 ENCOUNTER — Ambulatory Visit: Payer: Medicare Other | Admitting: Rheumatology

## 2019-06-24 NOTE — Progress Notes (Signed)
Office Visit Note  Patient: [name]             Date of Birth: [birth year]           MRN: [id number]             PCP: Andi Devon, MD Referring: Dominica Severin, MD Visit Date: 06/25/2019 Occupation: @GUAROCC @  Subjective:  New Patient (Initial Visit) (Dr. referral-right hand surgery, inflammation, right hand swelling, 1st and 5th digits, wrist swelling)   History of Present Illness: [name] is a 74 y.o. female seen in consultation per request of Dr. 66.  According to patient in 1996 he was not feeling very good and had a rash on her back.  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  She recalls being on some topical agents and oral meds which eventually improved.  She states over the years she avoided sun and did not have recurrence of the rash.  She did not develop any other features of lupus.  She states she had been doing well except for some degenerative changes in her neck and lumbar spine for which she had been under care of Dr. 1997 and had surgery for cervical spine and lumbar spine in the past.  She states about 3 years ago she started having severe neck pain and went to emergency room where the CT scan showed a mass around her right carotid.  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  She had repeat CT scan a year later which did not show any progression.  She was referred to Dr. Lovell Sheehan.  He referred her to Dr. Myra Gianotti who advised carotid biopsy.  Dr. Dierdre Forth thought the biopsy will be too risky.  She was referred to [facility].  Patient states she was going to a rheumatologist there who did extensive work-up including temporal artery biopsy which was negative.  All the work-up was negative.  Patient states she had very poor communication with [facility] and stopped going there.  She has not been to [facility] since 2018.  Dr. 2019 thought her symptoms were stable and  she did not require follow-up for 3 years.  About 6 months ago according to patient she developed pain and swelling in her right hand which she describes in her right thumb right fifth finger in her wrist joint.  She states swelling was very severe.  She went to see Dr. Ashok Cordia who gave her about 3 cortisone injections and then she underwent tenosynovectomy.  Patient states swelling came back again and she was given another steroid taper.  She is been on antibiotics and steroids.  None of the other joints are painful.  She states she was referred back to Dr. Amanda [name] but their office refused to accept her due to complicated nature of her disease.  Activities of Daily Living:  Patient reports morning stiffness for 5 minutes.   Patient Reports nocturnal pain.  Difficulty dressing/grooming: Reports Difficulty climbing stairs: Reports Difficulty getting out of chair: Reports Difficulty using hands for taps, buttons, cutlery, and/or writing: Reports  Review of Systems  Constitutional: Positive for fatigue. Negative for night sweats, weight gain and weight loss.  HENT: Positive for mouth dryness. Negative for mouth sores, trouble swallowing, trouble swallowing and nose dryness.   Eyes: Positive for dryness. Negative for pain, redness and visual disturbance.  Respiratory: Positive for shortness of breath. Negative for cough  and difficulty breathing.   Cardiovascular: Negative for chest pain, palpitations, hypertension, irregular heartbeat and swelling in legs/feet.  Gastrointestinal: Positive for constipation and diarrhea. Negative for blood in stool.  Endocrine: Negative for heat intolerance, excessive thirst and increased urination.  Genitourinary: Negative for difficulty urinating and vaginal dryness.  Musculoskeletal: Positive for arthralgias, joint pain, joint swelling, morning stiffness and muscle tenderness. Negative for gait problem, myalgias, muscle weakness and myalgias.  Skin: Negative for  color change, rash, hair loss, skin tightness, ulcers and sensitivity to sunlight.  Allergic/Immunologic: Negative for susceptible to infections.  Neurological: Positive for weakness. Negative for dizziness, memory loss and night sweats.  Hematological: Negative for bruising/bleeding tendency and swollen glands.  Psychiatric/Behavioral: Positive for sleep disturbance. Negative for depressed mood. The patient is not nervous/anxious.     PMFS History:  Patient Active Problem List   Diagnosis Date Noted  . Leg pain, left 07/27/2014  . Cervical spondylosis with radiculopathy 01/24/2012  . EMPHYSEMA 08/05/2008  . LEUKOCYTOSIS 06/25/2008  . COPD 06/25/2008  . GASTROPARESIS 06/25/2008  . DM 06/14/2008  . HYPERLIPIDEMIA 06/14/2008  . Essential hypertension 06/14/2008  . ASTHMATIC BRONCHITIS, ACUTE 06/14/2008  . ALLERGIC RHINITIS 06/14/2008  . LUPUS 06/14/2008    Past Medical History:  Diagnosis Date  . Arthritis    "little bit qwhere" (01/24/2012)  . Asthma   . Chronic back pain    "neck to tailbone" (01/24/2012)  . Chronic bronchitis (HCC)    "last time ~ 1 month ago" (01/24/2012)  . COPD (chronic obstructive pulmonary disease) (HCC)   . Depression    takes Zoloft daily  . Fibromyalgia   . History of bladder infections    sees a urologist--on long term Macrodantin nightly  . History of blood transfusion    as a child  . History of blood transfusion    "when I was a child" (01/24/2012)  . Hyperlipidemia    takes Simvastatin daily  . Hypertension    takes Maxzide daily  . Hypothyroidism    takes Synthroid daily  . Insomnia    takes Ambien nightly  . Lupus (HCC)   . Neck pain    herniated disc and radiculopathy  . Peripheral neuropathy   . Pneumonia 2012   "have had it a couple times" (01/24/2012)  . Smokers' cough (HCC)    has albuterol prn  . Type II diabetes mellitus (HCC)    takes Janumet daily  . Urinary frequency     Family History  Problem Relation Age of  Onset  . Heart disease Mother   . Arrhythmia Father   . Heart disease Sister   . Heart attack Brother   . Diabetes Brother    Past Surgical History:  Procedure Laterality Date  . ANTERIOR CERVICAL DECOMP/DISCECTOMY FUSION  2002; 01/24/2012  . ANTERIOR CERVICAL DECOMP/DISCECTOMY FUSION  01/24/2012   Procedure: ANTERIOR CERVICAL DECOMPRESSION/DISCECTOMY FUSION 1 LEVEL;  Surgeon: Cristi LoronJeffrey D Jenkins, MD;  Location: [facility];  Service: Neurosurgery;  Laterality: N/A;  CERVICAL THREE-FOUR anterior cervical decompression with fusion interbody prothesis plating and bonegraft  . APPENDECTOMY  1960's  . BACK SURGERY  2001  . BLADDER SURGERY  1977   through abdomen  . BLADDER SUSPENSION  1974  . BUNIONECTOMY  1980's   left  . CARDIAC CATHETERIZATION  2008  . COLONOSCOPY    . ESOPHAGOGASTRODUODENOSCOPY    . LUMBAR FUSION  2009  . RECTOCELE REPAIR  2000  . VAGINAL HYSTERECTOMY  1974   Social History  Social History Narrative  . Not on file   Immunization History  Administered Date(s) Administered  . Influenza Split 01/25/2012  . Influenza, High Dose Seasonal PF 01/01/2017  . Influenza-Unspecified 12/03/2012  . Pneumococcal Polysaccharide-23 01/25/2012     Objective: Vital Signs: BP 120/72 (BP Location: Right Arm, Patient Position: Sitting, Cuff Size: Normal)   Pulse 89   Resp 18   Ht 5\' 5"  (1.651 m)   Wt 133 lb 6.4 oz (60.5 kg)   BMI 22.20 kg/m    Physical Exam Vitals and nursing note reviewed.  Constitutional:      Appearance: She is well-developed.  HENT:     Head: Normocephalic and atraumatic.  Eyes:     Conjunctiva/sclera: Conjunctivae normal.  Cardiovascular:     Rate and Rhythm: Normal rate and regular rhythm.     Heart sounds: Normal heart sounds.  Pulmonary:     Effort: Pulmonary effort is normal.     Breath sounds: Normal breath sounds.  Abdominal:     General: Bowel sounds are normal.     Palpations: Abdomen is soft.  Musculoskeletal:     Cervical  back: Normal range of motion.  Lymphadenopathy:     Cervical: No cervical adenopathy.  Skin:    General: Skin is warm and dry.     Capillary Refill: Capillary refill takes less than 2 seconds.  Neurological:     Mental Status: She is alert and oriented to person, place, and time.  Psychiatric:        Behavior: Behavior normal.      Musculoskeletal Exam: Patient has limited range of motion of her cervical spine.  She has discomfort range of motion of her lumbar spine.  Shoulder joints and elbow joints with good range of motion.  She has right flexor tenosynovitis of her wrist.  Surgical scar was present over her wrist joint in her first and fifth finger but no swelling was noted over her fingers.  She has no tenderness of her left wrist MCPs or PIPs.  She has some discomfort range of motion of her hip joints which was mostly in her back.  Knee joints ankles MTPs PIPs with good range of motion with no synovitis.  She had no muscular weakness or tenderness.  CDAI Exam: CDAI Score: -- Patient Global: --; Provider Global: -- Swollen: --; Tender: -- Joint Exam 06/25/2019   No joint exam has been documented for this visit   There is currently no information documented on the homunculus. Go to the Rheumatology activity and complete the homunculus joint exam.  Investigation: No additional findings.  Imaging: XR Hand 2 View Left  Result Date: 06/25/2019 Juxta-articular osteopenia was noted.  PIP and DIP narrowing was noted.  Severe CMC narrowing and subluxation was noted.  No significant intercarpal and radiocarpal narrowing was noted.  No erosive changes were noted. 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  XR Hand 2 View Right  Result Date: 06/25/2019 Juxta-articular osteopenia was noted.  Severe CMC narrowing and subluxation was noted.  PIP and DIP narrowing was noted.  Intercarpal and radiocarpal joint space narrowing was noted.  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   Recent Labs: Lab Results  Component Value Date   WBC 14.8 (H) 10/08/2015   HGB 12.8 10/08/2015   PLT 288 10/08/2015   NA 134 (L) 10/08/2015   K 3.7 10/08/2015   CL 101 10/08/2015   CO2 25 10/08/2015  GLUCOSE 129 (H) 10/08/2015   BUN 14 10/08/2015   CREATININE 0.82 10/08/2015   BILITOT 0.5 01/12/2014   ALKPHOS 72 01/12/2014   AST 32 01/12/2014   ALT 32 01/12/2014   PROT 8.4 (H) 01/12/2014   ALBUMIN 4.2 01/12/2014   CALCIUM 8.5 (L) 10/08/2015   GFRAA >60 10/08/2015    Speciality Comments: No specialty comments available.  Procedures:  No procedures performed Allergies: Erythromycin, Penicillins, Gadolinium derivatives, Iodinated diagnostic agents, Latex, Morphine, Other, Prednisone, Sulfonamide derivatives, Varenicline tartrate, Atorvastatin, and Aspirin   Assessment / Plan:     Visit Diagnoses: Pain in both hands -patient has been experiencing the stiffness in her hands.  Although she had severe pain and swelling in her right hand for which she underwent tenosynovectomy.  Patient states she has had several cortisone injections prior to the surgery.  She is a still on prednisone due to ongoing pain and swelling.  I will obtain following labs to evaluate this further.  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  She has had no recurrence.  She has no other clinical features of systemic lupus.  Plan: ANA  Tenosynovitis of fingers -she underwent tenosynovectomy recently.  Referred by Dr. 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 Dierdre Forth and Dr. Myra Gianotti.  She was also seen at [facility].  Extensive work-up including temporal artery biopsy was negative.  She is supposed to follow-up with Dr. Myra Gianotti every 3 years.  Plan: Pan-ANCA.  I reviewed records from [facility] and also from Dr. Myra Gianotti.  All the radiology studies were also reviewed.  High risk medication use -in anticipation to start her on immunosuppressive therapy I will obtain following labs today.  Plan: CBC with Differential/Platelet, Hepatitis B core antibody, IgM, Hepatitis B surface antigen, Hepatitis C antibody, HIV Antibody (routine testing w rflx), QuantiFERON-TB Gold Plus, Serum protein electrophoresis with reflex, IgG, IgA, IgM, Thiopurine methyltransferase(tpmt)rbc, COMPLETE METABOLIC PANEL WITH GFR  DDD (degenerative disc disease), cervical - Status post fusion x2 Dr. Lovell Sheehan  DDD (degenerative disc disease), lumbar - Status post fusion by Dr. Lovell Sheehan  Fibromyalgia  Essential hypertension  History of hyperlipidemia  History of type 2 diabetes mellitus  History of diabetic gastroparesis  History of COPD  History of asthma  Smoker  History of hypothyroidism  History of depression    Orders: Orders Placed This Encounter  Procedures  . XR Hand 2 View Right  . XR Hand 2 View Left  . CBC with Differential/Platelet  . Sedimentation rate  . Rheumatoid factor  . ANA  . Cyclic citrul peptide antibody, IgG  . 14-3-3 eta Protein  . Pan-ANCA  . Hepatitis B core antibody, IgM  . Hepatitis B surface antigen  . Hepatitis C antibody  . HIV Antibody (routine testing w rflx)  . QuantiFERON-TB Gold Plus  . Serum protein electrophoresis with reflex  . IgG, IgA, IgM  . Thiopurine methyltransferase(tpmt)rbc  . COMPLETE METABOLIC PANEL WITH GFR   No orders of the defined types were placed in this encounter.   Face-to-face time spent with patient was 60 minutes. Greater than 50% of time was spent in  counseling and  coordination of care.  Follow-Up Instructions: Return for Inflammatory arthritis.   Bo Merino, MD  Note - This record has been created using Editor, commissioning.  Chart creation errors have been sought, but may not always  have been located. Such creation errors do not reflect on  the standard of medical care.

## 2019-06-25 ENCOUNTER — Ambulatory Visit: Payer: Self-pay

## 2019-06-25 ENCOUNTER — Encounter: Payer: Self-pay | Admitting: Rheumatology

## 2019-06-25 ENCOUNTER — Ambulatory Visit: Payer: Medicare Other | Admitting: Rheumatology

## 2019-06-25 ENCOUNTER — Other Ambulatory Visit: Payer: Self-pay

## 2019-06-25 VITALS — BP 120/72 | HR 89 | Resp 18 | Ht 65.0 in | Wt 133.4 lb

## 2019-06-25 DIAGNOSIS — L930 Discoid lupus erythematosus: Secondary | ICD-10-CM | POA: Diagnosis not present

## 2019-06-25 DIAGNOSIS — M659 Synovitis and tenosynovitis, unspecified: Secondary | ICD-10-CM

## 2019-06-25 DIAGNOSIS — Z9889 Other specified postprocedural states: Secondary | ICD-10-CM | POA: Diagnosis not present

## 2019-06-25 DIAGNOSIS — I10 Essential (primary) hypertension: Secondary | ICD-10-CM

## 2019-06-25 DIAGNOSIS — M2550 Pain in unspecified joint: Secondary | ICD-10-CM

## 2019-06-25 DIAGNOSIS — M329 Systemic lupus erythematosus, unspecified: Secondary | ICD-10-CM

## 2019-06-25 DIAGNOSIS — M797 Fibromyalgia: Secondary | ICD-10-CM

## 2019-06-25 DIAGNOSIS — M5030 Other cervical disc degeneration, unspecified cervical region: Secondary | ICD-10-CM

## 2019-06-25 DIAGNOSIS — M79641 Pain in right hand: Secondary | ICD-10-CM | POA: Diagnosis not present

## 2019-06-25 DIAGNOSIS — M4722 Other spondylosis with radiculopathy, cervical region: Secondary | ICD-10-CM

## 2019-06-25 DIAGNOSIS — M79642 Pain in left hand: Secondary | ICD-10-CM

## 2019-06-25 DIAGNOSIS — M5136 Other intervertebral disc degeneration, lumbar region: Secondary | ICD-10-CM

## 2019-06-25 DIAGNOSIS — Z8659 Personal history of other mental and behavioral disorders: Secondary | ICD-10-CM

## 2019-06-25 DIAGNOSIS — Z8639 Personal history of other endocrine, nutritional and metabolic disease: Secondary | ICD-10-CM

## 2019-06-25 DIAGNOSIS — G9001 Carotid sinus syncope: Secondary | ICD-10-CM

## 2019-06-25 DIAGNOSIS — Z8709 Personal history of other diseases of the respiratory system: Secondary | ICD-10-CM

## 2019-06-25 DIAGNOSIS — F17200 Nicotine dependence, unspecified, uncomplicated: Secondary | ICD-10-CM

## 2019-06-25 DIAGNOSIS — Z79899 Other long term (current) drug therapy: Secondary | ICD-10-CM

## 2019-06-25 LAB — CBC WITH DIFFERENTIAL/PLATELET
Basophils Relative: 0.9 %
Eosinophils Relative: 3.1 %
HCT: 40.2 % (ref 35.0–45.0)
MCV: 88.5 fL (ref 80.0–100.0)
Monocytes Relative: 5.4 %
Platelets: 304 10*3/uL (ref 140–400)

## 2019-06-28 NOTE — Progress Notes (Signed)
Office Visit Note  Patient: [name]             Date of Birth: Jan 22, 1946           MRN: [id number]             PCP: Willey Blade, MD Referring: Willey Blade, MD Visit Date: 07/07/2019 Occupation: @GUAROCC @  Subjective:  Follow-up (Right hand swelling)   History of Present Illness: [name] is a 74 y.o. female with history of inflammatory arthritis involving her right wrist and right hand. She states she has been off Medrol for 3 to 4 weeks. She states the pain and swelling is coming back in her wrist and her hand. She is having difficulty using her right hand. None of the other joints are swollen. I also had a conversation with Dr. Amedeo Plenty who stated that all the cultures have been negative.  Activities of Daily Living:  Patient reports morning stiffness for 0 none.   Patient Reports nocturnal pain.  Difficulty dressing/grooming: Denies Difficulty climbing stairs: Reports Difficulty getting out of chair: Reports Difficulty using hands for taps, buttons, cutlery, and/or writing: Reports  Review of Systems  Constitutional: Positive for fatigue. Negative for night sweats, weight gain and weight loss.  HENT: Positive for mouth dryness. Negative for mouth sores, trouble swallowing, trouble swallowing and nose dryness.   Eyes: Positive for dryness. Negative for pain, redness and visual disturbance.  Respiratory: Positive for shortness of breath. Negative for cough and difficulty breathing.        COPD  Cardiovascular: Negative for chest pain, palpitations, hypertension, irregular heartbeat and swelling in legs/feet.  Gastrointestinal: Positive for constipation and diarrhea. Negative for blood in stool.  Endocrine: Positive for increased urination. Negative for cold intolerance and excessive thirst.  Genitourinary: Negative for difficulty urinating and vaginal dryness.  Musculoskeletal: Positive for arthralgias, gait problem, joint pain, joint swelling  and muscle weakness. Negative for myalgias, morning stiffness, muscle tenderness and myalgias.  Skin: Negative for color change, rash, hair loss, skin tightness, ulcers and sensitivity to sunlight.  Allergic/Immunologic: Positive for susceptible to infections.  Neurological: Negative for dizziness, numbness, memory loss, night sweats and weakness.  Hematological: Negative for bruising/bleeding tendency and swollen glands.  Psychiatric/Behavioral: Positive for sleep disturbance. Negative for depressed mood. The patient is not nervous/anxious.     PMFS History:  Patient Active Problem List   Diagnosis Date Noted  . Leg pain, left 07/27/2014  . Cervical spondylosis with radiculopathy 01/24/2012  . EMPHYSEMA 08/05/2008  . LEUKOCYTOSIS 06/25/2008  . COPD 06/25/2008  . GASTROPARESIS 06/25/2008  . DM 06/14/2008  . HYPERLIPIDEMIA 06/14/2008  . Essential hypertension 06/14/2008  . ASTHMATIC BRONCHITIS, ACUTE 06/14/2008  . ALLERGIC RHINITIS 06/14/2008  . LUPUS 06/14/2008    Past Medical History:  Diagnosis Date  . Arthritis    "little bit qwhere" (01/24/2012)  . Asthma   . Chronic back pain    "neck to tailbone" (01/24/2012)  . Chronic bronchitis ([place])    "last time ~ 1 month ago" (01/24/2012)  . COPD (chronic obstructive pulmonary disease) ([place])   . Depression    takes Zoloft daily  . Fibromyalgia   . History of bladder infections    sees a urologist--on long term Macrodantin nightly  . History of blood transfusion    as a child  . History of blood transfusion    "when I was a child" (01/24/2012)  . Hyperlipidemia    takes Simvastatin daily  . Hypertension  takes Maxzide daily  . Hypothyroidism    takes Synthroid daily  . Insomnia    takes Ambien nightly  . Lupus (HCC)   . Neck pain    herniated disc and radiculopathy  . Peripheral neuropathy   . Pneumonia 2012   "have had it a couple times" (01/24/2012)  . Smokers' cough (HCC)    has albuterol prn  . Type II  diabetes mellitus (HCC)    takes Janumet daily  . Urinary frequency     Family History  Problem Relation Age of Onset  . Heart disease Mother   . Arrhythmia Father   . Heart disease Sister   . Heart attack Brother   . Diabetes Brother    Past Surgical History:  Procedure Laterality Date  . ANTERIOR CERVICAL DECOMP/DISCECTOMY FUSION  2002; 01/24/2012  . ANTERIOR CERVICAL DECOMP/DISCECTOMY FUSION  01/24/2012   Procedure: ANTERIOR CERVICAL DECOMPRESSION/DISCECTOMY FUSION 1 LEVEL;  Surgeon: Cristi Loron, MD;  Location: [facility];  Service: Neurosurgery;  Laterality: N/A;  CERVICAL THREE-FOUR anterior cervical decompression with fusion interbody prothesis plating and bonegraft  . APPENDECTOMY  1960's  . BACK SURGERY  2001  . BLADDER SURGERY  1977   through abdomen  . BLADDER SUSPENSION  1974  . BUNIONECTOMY  1980's   left  . CARDIAC CATHETERIZATION  2008  . COLONOSCOPY    . ESOPHAGOGASTRODUODENOSCOPY    . LUMBAR FUSION  2009  . RECTOCELE REPAIR  2000  . VAGINAL HYSTERECTOMY  1974   Social History   Social History Narrative  . Not on file   Immunization History  Administered Date(s) Administered  . Influenza Split 01/25/2012  . Influenza, High Dose Seasonal PF 01/01/2017  . Influenza-Unspecified 12/03/2012  . Pneumococcal Polysaccharide-23 01/25/2012     Objective: Vital Signs: BP 132/73 (BP Location: Left Arm, Patient Position: Sitting, Cuff Size: Normal)   Resp 18   Ht 5\' 5"  (1.651 m)   Wt 137 lb 3.2 oz (62.2 kg)   BMI 22.83 kg/m    Physical Exam Vitals and nursing note reviewed.  Constitutional:      Appearance: She is well-developed.  HENT:     Head: Normocephalic and atraumatic.  Eyes:     Conjunctiva/sclera: Conjunctivae normal.  Cardiovascular:     Rate and Rhythm: Normal rate and regular rhythm.     Heart sounds: Normal heart sounds.  Pulmonary:     Effort: Pulmonary effort is normal.     Breath sounds: Normal breath sounds.  Abdominal:      General: Bowel sounds are normal.     Palpations: Abdomen is soft.  Musculoskeletal:     Cervical back: Normal range of motion.  Lymphadenopathy:     Cervical: No cervical adenopathy.  Skin:    General: Skin is warm and dry.     Capillary Refill: Capillary refill takes less than 2 seconds.  Neurological:     Mental Status: She is alert and oriented to person, place, and time.  Psychiatric:        Behavior: Behavior normal.      Musculoskeletal Exam: Patient has limited range of motion of cervical and lumbar spine. Shoulder joints, elbow joints, left wrist joint were in good range of motion. Right wrist joint has limited range of motion. She has severe flexor tenosynovitis and tenosynovitis of her right first and fifth flexor tendons. Hip joints, knee joints, ankles, MTPs and PIPs with good range of motion with no synovitis.  CDAI Exam: CDAI Score: --  Patient Global: --; Provider Global: -- Swollen: --; Tender: -- Joint Exam 07/07/2019   No joint exam has been documented for this visit   There is currently no information documented on the homunculus. Go to the Rheumatology activity and complete the homunculus joint exam.  Investigation: No additional findings.  Imaging: XR Hand 2 View Left  Result Date: 06/25/2019 Juxta-articular osteopenia was noted.  PIP and DIP narrowing was noted.  Severe CMC narrowing and subluxation was noted.  No significant intercarpal and radiocarpal narrowing was noted.  No erosive changes were noted. 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  XR Hand 2 View Right  Result Date: 06/25/2019 Juxta-articular osteopenia was noted.  Severe CMC narrowing and subluxation was noted.  PIP and DIP narrowing was noted.  Intercarpal and radiocarpal joint space narrowing was noted. 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   Recent Labs: Lab Results   Component Value Date   WBC 15.3 (H) 06/25/2019   HGB 13.8 06/25/2019   PLT 304 06/25/2019   NA 132 (L) 06/25/2019   K 3.6 06/25/2019   CL 94 (L) 06/25/2019   CO2 25 06/25/2019   GLUCOSE 112 (H) 06/25/2019   BUN 21 06/25/2019   CREATININE 0.91 06/25/2019   BILITOT 0.5 06/25/2019   ALKPHOS 72 01/12/2014   AST 14 06/25/2019   ALT 11 06/25/2019   PROT 7.2 06/25/2019   PROT 7.4 06/25/2019   ALBUMIN 4.2 01/12/2014   CALCIUM 9.0 06/25/2019   GFRAA 72 06/25/2019   QFTBGOLDPLUS NEGATIVE 06/25/2019   June 30, 2019 TB Gold negative, SPEP normal, hepatitis B-, hepatitis C negative, immunoglobulins normal, HIV negative, TPMT normal ANCA negative RF negative anti-CCP negative, 14 3 3  eta negative, ANA negative  Speciality Comments: No specialty comments available.  Procedures:  No procedures performed Allergies: Erythromycin, Penicillins, Gadolinium derivatives, Iodinated diagnostic agents, Latex, Morphine, Other, Prednisone, Sulfonamide derivatives, Varenicline tartrate, Atorvastatin, and Aspirin   Assessment / Plan:     Visit Diagnoses: Seronegative rheumatoid arthritis (HCC) - Status post tenosynovectomy January 09, 2019, patient was treated with cortisone injection and prednisone by Dr. January 11, 2019. Patient states she has been off prednisone for last 3 weeks now. Her swelling in discomfort is coming back. She is having difficulty making a fist and moving her wrist. She states she cannot use her right hand. X-rays obtained at the last visit showed intercarpal radiocarpal joint space narrowing. None of the other joints are painful. I also had a detailed conversation with Dr. [name] last week. He mentioned that all her cultures have been negative. We discussed use of immunosuppressant agent. He was in agreement. Today I detailed discussion with patient regarding the use of methotrexate. Indications side effects contraindications were discussed at length. Patient was in agreement to proceed with the  medication. Handout was given and consent was taken. We will start her on methotrexate 25 mg/mL, 0.4 mL subcu weekly for 2 weeks, if labs are stable we will increase the dose to 0.6 mL subcu weekly for 2 weeks and again if the labs are stable will increase it to 0.8 mL subcu weekly for 2 weeks. We will check labs every 2 weeks x 3 and then every 2 months. Folic acid will be given 1 mg tablet, 2 tablets p.o. daily. I would also give Medrol 4 mg, 4 tablets p.o. every morning for 1 week and taper by 1 tablet every week. Patient is diabetic and she has been advised to  monitor blood sugar closely.  Drug Counseling TB Gold: June 25, 2019 Hepatitis panel: June 25, 2019  Chest-xray: December 26, 2017 her chest x-ray was normal. Although she has had Covid 19 infection in February. She states she had cough for some time. We will get another chest x-ray.  Contraception: Not indicated  Alcohol use: None  Patient was counseled on the purpose, proper use, and adverse effects of methotrexate including nausea, infection, and signs and symptoms of pneumonitis.  Reviewed instructions with patient to take methotrexate weekly along with folic acid daily.  Discussed the importance of frequent monitoring of kidney and liver function and blood counts, and provided patient with standing lab instructions.  Counseled patient to avoid NSAIDs and alcohol while on methotrexate.  Provided patient with educational materials on methotrexate and answered all questions.  Advised patient to get annual influenza vaccine and to get a pneumococcal vaccine if patient has not already had one.  Patient voiced understanding.  Patient consented to methotrexate use.  Will upload into chart.    Tenosynovitis of fingers-she has recurrence of tenosynovitis.  Tenosynovitis of right wrist-recurrence of tenosynovitis was noted.  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 Dierdre Forth and Dr. Myra Gianotti 2017.  Admission at [facility] was negative.  Temporal artery biopsy was negative.  Followed by Dr. Myra Gianotti every 3 years.  DDD (degenerative disc disease), cervical - Status post fusion x2 by Dr. Lovell Sheehan  DDD (degenerative disc disease), lumbar - Status post fusion by Dr. Susanne Borders has some generalized pain.  Essential hypertension-her blood pressure is stable.  History of hyperlipidemia  History of type 2 diabetes mellitus-she is on Metformin. Have advised her to monitor her blood glucose closely.  History of diabetic gastroparesis  History of COPD-she has inhalers. History of asthma  History of hypothyroidism  History of depression  Smoker-smoking cessation was discussed.  Orders: Orders Placed This Encounter  Procedures  . DG Chest 2 View   Meds ordered this encounter  Medications  . methylPREDNISolone (MEDROL) 4 MG tablet    Sig: Take 4 tablets po every morning for 1 week, then taper by 1 tablet every week. (then 3 tablets daily x 1 week, then 2 tablets daily x 1 week, then 1 tablet daily x 1 week)    Dispense:  70 tablet    Refill:  0  . folic acid (FOLVITE) 1 MG tablet    Sig: Take 2 tablets (2 mg total) by mouth daily.    Dispense:  180 tablet    Refill:  2  . methotrexate 50 MG/2ML injection    Sig: Inject 0.4 mLs into the skin once weekly x2 weeks, if labs are stable, then inject 0.6 mLs into the skin once weekly x2 weeks, if labs are stable then inject 0.8 mLs into the skin once weekly x2 weeks.    Dispense:  4 mL    Refill:  0  . TUBERCULIN SYR 1CC/27GX1/2" 27G X 1/2" 1 ML MISC    Sig: Use once weekly to inject methotrexate.    Dispense:  12 each    Refill:  2    Face-to-face time spent with patient was 45 minutes. Greater than 50% of time was spent in counseling and coordination of care.  Follow-Up Instructions: Return in about 4 weeks (around 08/04/2019) for Rheumatoid  arthritis.   Pollyann Savoy, MD  Note -  This record has been created using [employer].  Chart creation errors have been sought, but may not always  have been located. Such creation errors do not reflect on  the standard of medical care.

## 2019-06-29 NOTE — Progress Notes (Signed)
WBC are elevated most likely due to steroid use.  Sed rate is elevated.  All autoimmune work-up is negative so far.  I had detailed conversation with Dr. [name] today.  He states that she had severe tenosynovitis and synovitis in her right hand.  My question was if the infection has been ruled out.  He was going to review the biopsy results and fax [place] the information once he reviews it.  He said infection is very unlikely based on the clinical picture.

## 2019-06-29 NOTE — Progress Notes (Signed)
Patient recently had prednisone and steroid injections to her wrist which most likely is the cause of elevated white cell count.  Her sed rate is elevated.  Some of the labs are still pending.

## 2019-06-30 NOTE — Progress Notes (Signed)
I received a phone call from Dr. [name] today.  He stated that the synovial biopsy culture including aerobic, anaerobic and fungal were all negative.  He does not suspect infection.  We discussed placing patient on methotrexate due to fulminant synovitis.  He was in agreement.  I will discuss this further with the patient at the follow-up visit.

## 2019-07-01 LAB — COMPLETE METABOLIC PANEL WITH GFR
AG Ratio: 1.2 (calc) (ref 1.0–2.5)
ALT: 11 U/L (ref 6–29)
AST: 14 U/L (ref 10–35)
Albumin: 4.1 g/dL (ref 3.6–5.1)
Alkaline phosphatase (APISO): 85 U/L (ref 37–153)
BUN: 21 mg/dL (ref 7–25)
CO2: 25 mmol/L (ref 20–32)
Calcium: 9 mg/dL (ref 8.6–10.4)
Chloride: 94 mmol/L — ABNORMAL LOW (ref 98–110)
Creat: 0.91 mg/dL (ref 0.60–0.93)
GFR, Est African American: 72 mL/min/{1.73_m2} (ref >=60)
GFR, Est Non African American: 62 mL/min/{1.73_m2} (ref >=60)
Globulin: 3.3 g/dL (calc) (ref 1.9–3.7)
Glucose, Bld: 112 mg/dL — ABNORMAL HIGH (ref 65–99)
Potassium: 3.6 mmol/L (ref 3.5–5.3)
Sodium: 132 mmol/L — ABNORMAL LOW (ref 135–146)
Total Bilirubin: 0.5 mg/dL (ref 0.2–1.2)
Total Protein: 7.4 g/dL (ref 6.1–8.1)

## 2019-07-01 LAB — CBC WITH DIFFERENTIAL/PLATELET
Absolute Monocytes: 826 cells/uL (ref 200–950)
Basophils Absolute: 138 cells/uL (ref 0–200)
Eosinophils Absolute: 474 cells/uL (ref 15–500)
Hemoglobin: 13.8 g/dL (ref 11.7–15.5)
Lymphs Abs: 1484 cells/uL (ref 850–3900)
MCH: 30.4 pg (ref 27.0–33.0)
MCHC: 34.3 g/dL (ref 32.0–36.0)
MPV: 9.2 fL (ref 7.5–12.5)
Neutro Abs: 12378 cells/uL — ABNORMAL HIGH (ref 1500–7800)
Neutrophils Relative %: 80.9 %
RBC: 4.54 10*6/uL (ref 3.80–5.10)
RDW: 14.1 % (ref 11.0–15.0)
Total Lymphocyte: 9.7 %
WBC: 15.3 10*3/uL — ABNORMAL HIGH (ref 3.8–10.8)

## 2019-07-01 LAB — PROTEIN ELECTROPHORESIS, SERUM, WITH REFLEX
Albumin ELP: 3.9 g/dL (ref 3.8–4.8)
Alpha 1: 0.4 g/dL — ABNORMAL HIGH (ref 0.2–0.3)
Alpha 2: 0.9 g/dL (ref 0.5–0.9)
Beta 2: 0.6 g/dL — ABNORMAL HIGH (ref 0.2–0.5)
Beta Globulin: 0.5 g/dL (ref 0.4–0.6)
Gamma Globulin: 0.9 g/dL (ref 0.8–1.7)
Total Protein: 7.2 g/dL (ref 6.1–8.1)

## 2019-07-01 LAB — HEPATITIS B CORE ANTIBODY, IGM: Hep B C IgM: NONREACTIVE

## 2019-07-01 LAB — PAN-ANCA
ANCA Screen: NEGATIVE
Myeloperoxidase Abs: <1 AI
Serine Protease 3: <1 AI

## 2019-07-01 LAB — THIOPURINE METHYLTRANSFERASE (TPMT), RBC: Thiopurine Methyltransferase, RBC: 15 nmol/hr/mL RBC

## 2019-07-01 LAB — QUANTIFERON-TB GOLD PLUS
Mitogen-NIL: >10 IU/mL
NIL: 0.02 IU/mL
QuantiFERON-TB Gold Plus: NEGATIVE
TB1-NIL: 0 IU/mL
TB2-NIL: 0.01 IU/mL

## 2019-07-01 LAB — HEPATITIS C ANTIBODY
Hepatitis C Ab: NONREACTIVE
SIGNAL TO CUT-OFF: 0.01 (ref <1.00)

## 2019-07-01 LAB — 14-3-3 ETA PROTEIN: 14-3-3 eta Protein: <0.2 ng/mL (ref <0.2)

## 2019-07-01 LAB — IGG, IGA, IGM
IgG (Immunoglobin G), Serum: 1022 mg/dL (ref 600–1540)
IgM, Serum: 154 mg/dL (ref 50–300)
Immunoglobulin A: 525 mg/dL — ABNORMAL HIGH (ref 70–320)

## 2019-07-01 LAB — SEDIMENTATION RATE: Sed Rate: 77 mm/h — ABNORMAL HIGH (ref 0–30)

## 2019-07-01 LAB — CYCLIC CITRUL PEPTIDE ANTIBODY, IGG: Cyclic Citrullin Peptide Ab: <16 UNITS

## 2019-07-01 LAB — HIV ANTIBODY (ROUTINE TESTING W REFLEX): HIV 1&2 Ab, 4th Generation: NONREACTIVE

## 2019-07-01 LAB — HEPATITIS B SURFACE ANTIGEN: Hepatitis B Surface Ag: NONREACTIVE

## 2019-07-01 LAB — ANA: Anti Nuclear Antibody (ANA): NEGATIVE

## 2019-07-01 LAB — RHEUMATOID FACTOR: Rhuematoid fact SerPl-aCnc: <14 IU/mL (ref <14)

## 2019-07-07 ENCOUNTER — Ambulatory Visit: Payer: Medicare Other | Admitting: Rheumatology

## 2019-07-07 ENCOUNTER — Other Ambulatory Visit: Payer: Self-pay

## 2019-07-07 ENCOUNTER — Encounter: Payer: Self-pay | Admitting: Rheumatology

## 2019-07-07 VITALS — BP 132/73 | Resp 18 | Ht 65.0 in | Wt 137.2 lb

## 2019-07-07 DIAGNOSIS — Z8639 Personal history of other endocrine, nutritional and metabolic disease: Secondary | ICD-10-CM

## 2019-07-07 DIAGNOSIS — Z8709 Personal history of other diseases of the respiratory system: Secondary | ICD-10-CM

## 2019-07-07 DIAGNOSIS — M0600 Rheumatoid arthritis without rheumatoid factor, unspecified site: Secondary | ICD-10-CM

## 2019-07-07 DIAGNOSIS — G9001 Carotid sinus syncope: Secondary | ICD-10-CM

## 2019-07-07 DIAGNOSIS — I10 Essential (primary) hypertension: Secondary | ICD-10-CM

## 2019-07-07 DIAGNOSIS — L930 Discoid lupus erythematosus: Secondary | ICD-10-CM

## 2019-07-07 DIAGNOSIS — M5030 Other cervical disc degeneration, unspecified cervical region: Secondary | ICD-10-CM

## 2019-07-07 DIAGNOSIS — M5136 Other intervertebral disc degeneration, lumbar region: Secondary | ICD-10-CM

## 2019-07-07 DIAGNOSIS — Z79899 Other long term (current) drug therapy: Secondary | ICD-10-CM | POA: Diagnosis not present

## 2019-07-07 DIAGNOSIS — Z8659 Personal history of other mental and behavioral disorders: Secondary | ICD-10-CM

## 2019-07-07 DIAGNOSIS — F17200 Nicotine dependence, unspecified, uncomplicated: Secondary | ICD-10-CM

## 2019-07-07 DIAGNOSIS — M1990 Unspecified osteoarthritis, unspecified site: Secondary | ICD-10-CM

## 2019-07-07 DIAGNOSIS — M797 Fibromyalgia: Secondary | ICD-10-CM

## 2019-07-07 DIAGNOSIS — M65949 Unspecified synovitis and tenosynovitis, unspecified hand: Secondary | ICD-10-CM

## 2019-07-07 DIAGNOSIS — M51369 Other intervertebral disc degeneration, lumbar region without mention of lumbar back pain or lower extremity pain: Secondary | ICD-10-CM

## 2019-07-07 DIAGNOSIS — M659 Synovitis and tenosynovitis, unspecified: Secondary | ICD-10-CM

## 2019-07-07 DIAGNOSIS — M65931 Unspecified synovitis and tenosynovitis, right forearm: Secondary | ICD-10-CM

## 2019-07-07 MED ORDER — METHOTREXATE SODIUM CHEMO INJECTION 50 MG/2ML: INTRAMUSCULAR | 0 refills | Status: DC

## 2019-07-07 MED ORDER — METHYLPREDNISOLONE 4 MG PO TABS: ORAL_TABLET | ORAL | 0 refills | Status: DC

## 2019-07-07 MED ORDER — "TUBERCULIN SYRINGE 27G X 1/2"" 1 ML MISC": 2 refills | Status: DC

## 2019-07-07 MED ORDER — FOLIC ACID 1 MG PO TABS: 2.0000 mg | ORAL_TABLET | Freq: Every day | ORAL | 2 refills | Status: DC

## 2019-07-07 NOTE — Patient Instructions (Addendum)
Standing Labs We placed an order today for your standing lab work.    Please come back and get your standing labs in 2 weeks x 3, then 2 months and then every 3 months.  We have open lab daily Monday through Thursday from 8:30-12:30 PM and 1:30-4:30 PM and Friday from 8:30-12:30 PM and 1:30-4:00 PM at the office of Dr. Pollyann Savoy.   You may experience shorter wait times on Monday and Friday afternoons. The office is located at [address], [place], [name] [address] No appointment is necessary.   Labs are drawn by [facility].  You may receive a bill from [place] for your lab work.  If you wish to have your labs drawn at another location, please call the office 24 hours in advance to send orders.  If you have any questions regarding directions or hours of operation,  please call [phone].   Just as a reminder please drink plenty of water prior to coming for your lab work. Thanks!    Methotrexate injection What is this medicine? METHOTREXATE (METH oh TREX ate) is a chemotherapy drug used to treat cancer including breast cancer, leukemia, and lymphoma. This medicine can also be used to treat psoriasis and certain kinds of arthritis. This medicine may be used for other purposes; ask your health care provider or pharmacist if you have questions. What should I tell my health care provider before I take this medicine? They need to know if you have any of these conditions:  fluid in the stomach area or lungs  if you often drink alcohol  infection or immune system problems  kidney disease  liver disease  low blood counts, like low white cell, platelet, or red cell counts  lung disease  radiation therapy  stomach ulcers  ulcerative colitis  an unusual or allergic reaction to methotrexate, other medicines, foods, dyes, or preservatives  pregnant or trying to get pregnant  breast-feeding How should I use this medicine? This medicine is for infusion into  a vein or for injection into muscle or into the spinal fluid (whichever applies). It is usually given by a health care professional in a hospital or clinic setting. In rare cases, you might get this medicine at home. You will be taught how to give this medicine. Use exactly as directed. Take your medicine at regular intervals. Do not take your medicine more often than directed. If this medicine is used for arthritis or psoriasis, it should be taken weekly, NOT daily. It is important that you put your used needles and syringes in a special sharps container. Do not put them in a trash can. If you do not have a sharps container, call your pharmacist or healthcare provider to get one. Talk to your pediatrician regarding the use of this medicine in children. While this drug may be prescribed for children as young as 2 years for selected conditions, precautions do apply. Overdosage: If you think you have taken too much of this medicine contact a poison control center or emergency room at once. NOTE: This medicine is only for you. Do not share this medicine with others. What if I miss a dose? It is important not to miss your dose. Call your doctor or health care professional if you are unable to keep an appointment. If you give yourself the medicine and you miss a dose, talk with your doctor or health care professional. Do not take double or extra doses. What may interact with this medicine? This medicine may  interact with the following medications:  acitretin  aspirin or aspirin-like medicines including salicylates  azathioprine  certain antibiotics like chloramphenicol, penicillin, tetracycline  certain medicines for stomach problems like esomeprazole, omeprazole, pantoprazole  cyclosporine  gold  hydroxychloroquine  live virus vaccines  mercaptopurine  NSAIDs, medicines for pain and inflammation, like ibuprofen or naproxen  other cytotoxic  agents  penicillamine  phenylbutazone  phenytoin  probenacid  retinoids such as isotretinoin and tretinoin  steroid medicines like prednisone or cortisone  sulfonamides like sulfasalazine and trimethoprim/sulfamethoxazole  theophylline This list may not describe all possible interactions. Give your health care provider a list of all the medicines, herbs, non-prescription drugs, or dietary supplements you use. Also tell them if you smoke, drink alcohol, or use illegal drugs. Some items may interact with your medicine. What should I watch for while using this medicine? Avoid alcoholic drinks. In some cases, you may be given additional medicines to help with side effects. Follow all directions for their use. This medicine can make you more sensitive to the sun. Keep out of the sun. If you cannot avoid being in the sun, wear protective clothing and use sunscreen. Do not use sun lamps or tanning beds/booths. You may get drowsy or dizzy. Do not drive, use machinery, or do anything that needs mental alertness until you know how this medicine affects you. Do not stand or sit up quickly, especially if you are an older patient. This reduces the risk of dizzy or fainting spells. You may need blood work done while you are taking this medicine. Call your doctor or health care professional for advice if you get a fever, chills or sore throat, or other symptoms of a cold or flu. Do not treat yourself. This drug decreases your body's ability to fight infections. Try to avoid being around people who are sick. This medicine may increase your risk to bruise or bleed. Call your doctor or health care professional if you notice any unusual bleeding. Check with your doctor or health care professional if you get an attack of severe diarrhea, nausea and vomiting, or if you sweat a lot. The loss of too much body fluid can make it dangerous for you to take this medicine. Talk to your doctor about your risk of  cancer. You may be more at risk for certain types of cancers if you take this medicine. Both men and women must use effective birth control with this medicine. Do not become pregnant while taking this medicine or until at least 1 normal menstrual cycle has occurred after stopping it. Women should inform their doctor if they wish to become pregnant or think they might be pregnant. Men should not father a child while taking this medicine and for 3 months after stopping it. There is a potential for serious side effects to an unborn child. Talk to your health care professional or pharmacist for more information. Do not breast-feed an infant while taking this medicine. What side effects may I notice from receiving this medicine? Side effects that you should report to your doctor or health care professional as soon as possible:  allergic reactions like skin rash, itching or hives, swelling of the face, lips, or tongue  back pain  breathing problems or shortness of breath  confusion  diarrhea  dry, nonproductive cough  low blood counts - this medicine may decrease the number of white blood cells, red blood cells and platelets. You may be at increased risk of infections and bleeding  mouth sores  redness, blistering, peeling or loosening of the skin, including inside the mouth  seizures  severe headaches  signs of infection - fever or chills, cough, sore throat, pain or difficulty passing urine  signs and symptoms of bleeding such as bloody or black, tarry stools; red or dark-brown urine; spitting up blood or brown material that looks like coffee grounds; red spots on the skin; unusual bruising or bleeding from the eye, gums, or nose  signs and symptoms of kidney injury like trouble passing urine or change in the amount of urine  signs and symptoms of liver injury like dark yellow or brown urine; general ill feeling or flu-like symptoms; light-colored stools; loss of appetite; nausea; right  upper belly pain; unusually weak or tired; yellowing of the eyes or skin  stiff neck  vomiting Side effects that usually do not require medical attention (report to your doctor or health care professional if they continue or are bothersome):  dizziness  hair loss  headache  stomach pain  upset stomach This list may not describe all possible side effects. Call your doctor for medical advice about side effects. You may report side effects to FDA at 1-800-FDA-1088. Where should I keep my medicine? If you are using this medicine at home, you will be instructed on how to store this medicine. Throw away any unused medicine after the expiration date on the label. NOTE: This sheet is a summary. It may not cover all possible information. If you have questions about this medicine, talk to your doctor, pharmacist, or health care provider.  2020 Elsevier/Gold Standard (2016-10-11 13:31:42)

## 2019-07-17 ENCOUNTER — Telehealth: Payer: Self-pay | Admitting: Rheumatology

## 2019-07-17 NOTE — Telephone Encounter (Signed)
Patient took second injection of  MTX on Wednesday. Patient would like to know if labs on Monday would be too soon?  Please call to advise.

## 2019-07-17 NOTE — Telephone Encounter (Signed)
Patient advised she may come in to the office for labs. Patient advised we would be able to advise her about increasing MTX prior to her Wednesday dose after labs have resulted.

## 2019-07-20 ENCOUNTER — Other Ambulatory Visit: Payer: Self-pay | Admitting: *Deleted

## 2019-07-20 DIAGNOSIS — Z79899 Other long term (current) drug therapy: Secondary | ICD-10-CM

## 2019-07-20 LAB — CBC WITH DIFFERENTIAL/PLATELET
Absolute Monocytes: 748 cells/uL (ref 200–950)
Basophils Absolute: 102 cells/uL (ref 0–200)
Basophils Relative: 0.6 %
Eosinophils Absolute: 238 cells/uL (ref 15–500)
Eosinophils Relative: 1.4 %
HCT: 39.5 % (ref 35.0–45.0)
Hemoglobin: 13.4 g/dL (ref 11.7–15.5)
Lymphs Abs: 1887 cells/uL (ref 850–3900)
MCH: 30.5 pg (ref 27.0–33.0)
MCHC: 33.9 g/dL (ref 32.0–36.0)
MCV: 90 fL (ref 80.0–100.0)
MPV: 9.2 fL (ref 7.5–12.5)
Monocytes Relative: 4.4 %
Neutro Abs: 14025 cells/uL — ABNORMAL HIGH (ref 1500–7800)
Neutrophils Relative %: 82.5 %
Platelets: 257 10*3/uL (ref 140–400)
RBC: 4.39 10*6/uL (ref 3.80–5.10)
RDW: 14.3 % (ref 11.0–15.0)
Total Lymphocyte: 11.1 %
WBC: 17 10*3/uL — ABNORMAL HIGH (ref 3.8–10.8)

## 2019-07-20 LAB — COMPLETE METABOLIC PANEL WITH GFR
AG Ratio: 1.3 (calc) (ref 1.0–2.5)
ALT: 13 U/L (ref 6–29)
AST: 12 U/L (ref 10–35)
Albumin: 4 g/dL (ref 3.6–5.1)
Alkaline phosphatase (APISO): 76 U/L (ref 37–153)
BUN/Creatinine Ratio: 24 (calc) — ABNORMAL HIGH (ref 6–22)
BUN: 25 mg/dL (ref 7–25)
CO2: 28 mmol/L (ref 20–32)
Calcium: 9.1 mg/dL (ref 8.6–10.4)
Chloride: 97 mmol/L — ABNORMAL LOW (ref 98–110)
Creat: 1.06 mg/dL — ABNORMAL HIGH (ref 0.60–0.93)
GFR, Est African American: 60 mL/min/{1.73_m2} (ref >=60)
GFR, Est Non African American: 52 mL/min/{1.73_m2} — ABNORMAL LOW (ref >=60)
Globulin: 3.1 g/dL (calc) (ref 1.9–3.7)
Glucose, Bld: 99 mg/dL (ref 65–99)
Potassium: 4.2 mmol/L (ref 3.5–5.3)
Sodium: 135 mmol/L (ref 135–146)
Total Bilirubin: 0.4 mg/dL (ref 0.2–1.2)
Total Protein: 7.1 g/dL (ref 6.1–8.1)

## 2019-07-21 ENCOUNTER — Telehealth: Payer: Self-pay

## 2019-07-21 NOTE — Telephone Encounter (Signed)
Advised patient She will have to discuss the variation in her glucose levels with her PCP.  As she tapers  Medrol dose, the glucose level would be easy to control..  She does not need to recheck her methotrexate level until the follow-up visit.  If she starts flaring coming off prednisone then we may have to add another medication. Patient verbalized understanding.

## 2019-07-21 NOTE — Progress Notes (Signed)
Her creatinine has gone up.  She would not be able to increase methotrexate.  She should stay on 0.4 mL subcu weekly.  If she has persistent symptoms we may have to add another medication.

## 2019-07-21 NOTE — Telephone Encounter (Signed)
She will have to discuss the variation in her glucose levels with her PCP.  As she tapers  Medrol dose, the glucose level would be easy to control..  She does not need to recheck her methotrexate level until the follow-up visit.  If she starts flaring coming off prednisone then we may have to add another medication.

## 2019-07-21 NOTE — Telephone Encounter (Signed)
I called patient to discuss lab results and patient states she is doing good on methotrexate 0.10ml once weekly. Patient is experiencing injection site bruising and a slight headache after injecting.   Patient states she is having a very hard time maintaining her glucose levels while taking medrol. Patient is currently taking 2 tabs of medrol daily and has had to increase her insulin every day. Patient would like to know any recommendations for this? Patient would also like to know when labs should be re-checked since she is not increasing dose of methotrexate. Please advise.

## 2019-07-24 NOTE — Progress Notes (Signed)
Office Visit Note  Patient: [name]             Date of Birth: December 31, 1945           MRN: [id number]             PCP: Andi Devon, MD Referring: Andi Devon, MD Visit Date: 08/04/2019 Occupation: @GUAROCC @  Subjective:  Oral thrush   History of Present Illness: [name] is a 74 y.o. female  With history of seronegative rheumatoid arthritis, fibromyalgia, and discoid lupus. She is on MTX 0.4 ml sq once weekly and folic acid 1 mg po daily. She was unable to increase the dose of MTX due to elevated creatinine.  She continues to take medrol as prescribed.  She has noticed weight gain and easy bruising while taking medrol. She has developed symptoms of oral thrush since starting on MTX.  She has been using oral gel and a mouth rinse without any improvement.  She denies any discoid lesions.  She has noticed significant improvement since starting on MTX.  She denies any joint pain or joint at this time.  Her right wrist pain has resolved.  Her generalized arthralgias and myalgias have improved.    Activities of Daily Living:  Patient reports morning stiffness for 0 none.   Patient Denies nocturnal pain.  Difficulty dressing/grooming: Denies Difficulty climbing stairs: Denies Difficulty getting out of chair: Reports Difficulty using hands for taps, buttons, cutlery, and/or writing: Denies  Review of Systems  Constitutional: Negative for fatigue.  HENT: Positive for mouth sores and mouth dryness. Negative for nose dryness.   Eyes: Positive for dryness. Negative for pain and visual disturbance.  Respiratory: Negative for cough, hemoptysis and difficulty breathing.   Cardiovascular: Negative for chest pain, palpitations, hypertension and swelling in legs/feet.  Gastrointestinal: Negative for blood in stool, constipation and diarrhea.  Endocrine: Negative for increased urination.  Genitourinary: Negative for difficulty urinating and painful urination.    Musculoskeletal: Negative for arthralgias, joint pain, joint swelling, myalgias, muscle weakness, morning stiffness, muscle tenderness and myalgias.  Skin: Negative for color change, pallor, rash, hair loss, nodules/bumps, skin tightness, ulcers and sensitivity to sunlight.  Allergic/Immunologic: Positive for susceptible to infections.  Neurological: Negative for dizziness and numbness.  Hematological: Positive for bruising/bleeding tendency. Negative for swollen glands.  Psychiatric/Behavioral: Negative for depressed mood and sleep disturbance. The patient is not nervous/anxious.     PMFS History:  Patient Active Problem List   Diagnosis Date Noted  . Leg pain, left 07/27/2014  . Cervical spondylosis with radiculopathy 01/24/2012  . EMPHYSEMA 08/05/2008  . LEUKOCYTOSIS 06/25/2008  . COPD 06/25/2008  . GASTROPARESIS 06/25/2008  . DM 06/14/2008  . HYPERLIPIDEMIA 06/14/2008  . Essential hypertension 06/14/2008  . ASTHMATIC BRONCHITIS, ACUTE 06/14/2008  . ALLERGIC RHINITIS 06/14/2008  . LUPUS 06/14/2008    Past Medical History:  Diagnosis Date  . Arthritis    "little bit qwhere" (01/24/2012)  . Asthma   . Chronic back pain    "neck to tailbone" (01/24/2012)  . Chronic bronchitis (HCC)    "last time ~ 1 month ago" (01/24/2012)  . COPD (chronic obstructive pulmonary disease) (HCC)   . Depression    takes Zoloft daily  . Fibromyalgia   . History of bladder infections    sees a urologist--on long term Macrodantin nightly  . History of blood transfusion    as a child  . History of blood transfusion    "when I was a child" (01/24/2012)  .  Hyperlipidemia    takes Simvastatin daily  . Hypertension    takes Maxzide daily  . Hypothyroidism    takes Synthroid daily  . Insomnia    takes Ambien nightly  . Lupus ([place])   . Neck pain    herniated disc and radiculopathy  . Peripheral neuropathy   . Pneumonia 2012   "have had it a couple times" (01/24/2012)  . Smokers' cough  (HCC)    has albuterol prn  . Type II diabetes mellitus (HCC)    takes Janumet daily  . Urinary frequency     Family History  Problem Relation Age of Onset  . Heart disease Mother   . Arrhythmia Father   . Heart disease Sister   . Heart attack Brother   . Diabetes Brother    Past Surgical History:  Procedure Laterality Date  . ANTERIOR CERVICAL DECOMP/DISCECTOMY FUSION  2002; 01/24/2012  . ANTERIOR CERVICAL DECOMP/DISCECTOMY FUSION  01/24/2012   Procedure: ANTERIOR CERVICAL DECOMPRESSION/DISCECTOMY FUSION 1 LEVEL;  Surgeon: Ophelia Charter, MD;  Location: [facility];  Service: Neurosurgery;  Laterality: N/A;  CERVICAL THREE-FOUR anterior cervical decompression with fusion interbody prothesis plating and bonegraft  . APPENDECTOMY  1960's  . BACK SURGERY  2001  . BLADDER SURGERY  1977   through abdomen  . BLADDER SUSPENSION  1974  . BUNIONECTOMY  1980's   left  . CARDIAC CATHETERIZATION  2008  . COLONOSCOPY    . ESOPHAGOGASTRODUODENOSCOPY    . LUMBAR FUSION  2009  . RECTOCELE REPAIR  2000  . VAGINAL HYSTERECTOMY  1974   Social History   Social History Narrative  . Not on file   Immunization History  Administered Date(s) Administered  . Influenza Split 01/25/2012  . Influenza, High Dose Seasonal PF 01/01/2017  . Influenza-Unspecified 12/03/2012  . Pneumococcal Polysaccharide-23 01/25/2012     Objective: Vital Signs: BP 112/62 (BP Location: Left Arm, Patient Position: Sitting, Cuff Size: Normal)   Pulse 71   Resp 16   Ht 5\' 5"  (1.651 m)   Wt 141 lb 3.2 oz (64 kg)   BMI 23.50 kg/m    Physical Exam Vitals and nursing note reviewed.  Constitutional:      Appearance: She is well-developed.  HENT:     Head: Normocephalic and atraumatic.  Eyes:     Conjunctiva/sclera: Conjunctivae normal.  Pulmonary:     Effort: Pulmonary effort is normal.  Abdominal:     General: Bowel sounds are normal.     Palpations: Abdomen is soft.  Musculoskeletal:     Cervical  back: Normal range of motion.  Lymphadenopathy:     Cervical: No cervical adenopathy.  Skin:    General: Skin is warm and dry.     Capillary Refill: Capillary refill takes less than 2 seconds.  Neurological:     Mental Status: She is alert and oriented to person, place, and time.  Psychiatric:        Behavior: Behavior normal.      Musculoskeletal Exam: C-spine limited ROM.  Limited ROM of lumbar spine.  Shoulder joints, elbow joints, MCPs, PIPs, and DIPs good ROM with no synovitis.  Limited ROM of right wrist joint.  Hip joints, knee joints, ankle joints, MTPs, PIPs, and DIPs good ROM with no synovitis.  No warmth or effusion of knee joints.  No tenderness or swelling of ankle joints.  CDAI Exam: CDAI Score: 0.2  Patient Global: 1 mm; Provider Global: 1 mm Swollen: 0 ; Tender: 0  Joint Exam 08/04/2019   No joint exam has been documented for this visit   There is currently no information documented on the homunculus. Go to the Rheumatology activity and complete the homunculus joint exam.  Investigation: No additional findings.  Imaging: No results found.  Recent Labs: Lab Results  Component Value Date   WBC 17.0 (H) 07/20/2019   HGB 13.4 07/20/2019   PLT 257 07/20/2019   NA 135 07/20/2019   K 4.2 07/20/2019   CL 97 (L) 07/20/2019   CO2 28 07/20/2019   GLUCOSE 99 07/20/2019   BUN 25 07/20/2019   CREATININE 1.06 (H) 07/20/2019   BILITOT 0.4 07/20/2019   ALKPHOS 72 01/12/2014   AST 12 07/20/2019   ALT 13 07/20/2019   PROT 7.1 07/20/2019   ALBUMIN 4.2 01/12/2014   CALCIUM 9.1 07/20/2019   GFRAA 60 07/20/2019   QFTBGOLDPLUS NEGATIVE 06/25/2019    Speciality Comments: No specialty comments available.  Procedures:  No procedures performed Allergies: Erythromycin, Penicillins, Gadolinium derivatives, Iodinated diagnostic agents, Latex, Morphine, Other, Prednisone, Sulfonamide derivatives, Varenicline tartrate, Atorvastatin, and Aspirin   Assessment / Plan:       Visit Diagnoses: Seronegative rheumatoid arthritis (HCC): She has no tenderness or synovitis on exam.  Her right wrist tenosynovitis has resolved.  She has noticed a significant improvement in her arthralgias and joint inflammation since starting on methotrexate about 1 month ago.  She has been injecting methotrexate 0.4 mL subcutaneously once weekly.  She has not had any injection site reactions.  She has developed oral candidiasis since starting on methotrexate and Medrol taper.  She will be completing the Medrol taper within the next few days.  A prescription for Magic mouthwash with nystatin was sent to the pharmacy today.  She was advised to notify us if her symptoms persist or worsen.  She will remain on low-dose methotrexate 0.4 mL every injections once weekly due to elevated creatinine and low GFR.  She was advised to notify us if her joint pain and inflammation returns after completing the medrol taper.  She will follow-up in the office in 2 months.   High risk medication use - Methotrexate 0.4 ml sq once weekly and folic acid 1 mg po daily-started in May 2021.  CBC and CMP were drawn on 07/20/2019.  Creatinine was 1.06 and GFR was 52 at that time.  She will remain on low-dose methotrexate.  Tenosynovitis of fingers: Resolved   Tenosynovitis of right wrist: Resolved   Discoid lupus:  She has no discoid lesions at this time.   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 Dierdre Forth and Dr. Myra Gianotti 2017.  Admission at [facility] was negative.  Temporal artery biopsy was negative.  Followed by Dr. Myra Gianotti every 3 years.  DDD (degenerative disc disease), cervical: She has limited range of motion on examination.  No symptoms of radiculopathy.  DDD (degenerative disc disease), lumbar: Chronic pain.  She has painful limited range of motion.  Her discomfort has been improving recently.  She is not having any symptoms of radiculopathy.  Fibromyalgia: She is not experiencing any muscle tenderness or myalgias due to  underlying fibromyalgia.  Over the past several weeks her arthralgias and myalgias have been improving.  Oral candidiasis: She has developed signs and symptoms of oral candidiasis since starting on methotrexate.  She is at higher risk for oral candidiasis due to her history of diabetes and currently taking a Medrol taper.  A prescription for Magic mouthwash with nystatin was sent to the pharmacy today.  Her symptoms will likely improve after discontinuing Medrol as well.  She was advised to notify us if she develops recurrent oral candidiasis.  She will remain on low-dose methotrexate.  Other medical conditions are listed as follows:   Essential hypertension  History of hyperlipidemia  History of type 2 diabetes mellitus  History of diabetic gastroparesis  History of COPD  History of asthma  History of hypothyroidism  History of depression  Smoker  Orders: No orders of the defined types were placed in this encounter.  Meds ordered this encounter  Medications  . magic mouthwash SOLN    Sig: Take 5 mLs by mouth 4 (four) times daily.    Dispense:  240 mL    Refill:  0    Benadryl, Maalox, Nystatin 1:1:1 ratio     Follow-Up Instructions: Return in about 2 months (around 10/04/2019) for Rheumatoid arthritis.   Sheppard Evens  I examined and evaluated the patient with [name] PA.  Patient is just on Medrol 4 mg p.o. daily.  She is doing very well on methotrexate 0.4 mL subcu weekly.  Unfortunately due to elevation in her kidney function we could not increase the dose of methotrexate.  She had no synovitis on my examination.  We will see how she when she completes the films of Medrol.  Advised her to contact me in case she develops synovitis.  She is also facing thrush.  We will call in prescription for nystatin.  The plan of care was discussed as noted above.  Pollyann Savoy, MD     Note - This record has been created using [employer] nutrition[employer].  Chart creation errors have  been sought, but may not always  have been located. Such creation errors do not reflect on  the standard of medical care.

## 2019-08-04 ENCOUNTER — Other Ambulatory Visit: Payer: Self-pay

## 2019-08-04 ENCOUNTER — Ambulatory Visit: Payer: Medicare Other | Admitting: Rheumatology

## 2019-08-04 ENCOUNTER — Encounter: Payer: Self-pay | Admitting: Rheumatology

## 2019-08-04 VITALS — BP 112/62 | HR 71 | Resp 16 | Ht 65.0 in | Wt 141.2 lb

## 2019-08-04 DIAGNOSIS — M659 Synovitis and tenosynovitis, unspecified: Secondary | ICD-10-CM | POA: Diagnosis not present

## 2019-08-04 DIAGNOSIS — F17200 Nicotine dependence, unspecified, uncomplicated: Secondary | ICD-10-CM

## 2019-08-04 DIAGNOSIS — M0600 Rheumatoid arthritis without rheumatoid factor, unspecified site: Secondary | ICD-10-CM | POA: Diagnosis not present

## 2019-08-04 DIAGNOSIS — Z79899 Other long term (current) drug therapy: Secondary | ICD-10-CM | POA: Diagnosis not present

## 2019-08-04 DIAGNOSIS — M5136 Other intervertebral disc degeneration, lumbar region: Secondary | ICD-10-CM

## 2019-08-04 DIAGNOSIS — B370 Candidal stomatitis: Secondary | ICD-10-CM

## 2019-08-04 DIAGNOSIS — M5030 Other cervical disc degeneration, unspecified cervical region: Secondary | ICD-10-CM

## 2019-08-04 DIAGNOSIS — Z8659 Personal history of other mental and behavioral disorders: Secondary | ICD-10-CM

## 2019-08-04 DIAGNOSIS — L930 Discoid lupus erythematosus: Secondary | ICD-10-CM

## 2019-08-04 DIAGNOSIS — Z8709 Personal history of other diseases of the respiratory system: Secondary | ICD-10-CM

## 2019-08-04 DIAGNOSIS — M797 Fibromyalgia: Secondary | ICD-10-CM

## 2019-08-04 DIAGNOSIS — Z8639 Personal history of other endocrine, nutritional and metabolic disease: Secondary | ICD-10-CM

## 2019-08-04 DIAGNOSIS — I10 Essential (primary) hypertension: Secondary | ICD-10-CM

## 2019-08-04 DIAGNOSIS — G9001 Carotid sinus syncope: Secondary | ICD-10-CM

## 2019-08-04 MED ORDER — MAGIC MOUTHWASH
5.0000 mL | Freq: Four times a day (QID) | ORAL | 0 refills | Status: DC
Start: 2019-08-04 — End: 2020-03-18

## 2019-08-21 ENCOUNTER — Ambulatory Visit: Payer: Medicare Other | Admitting: Rheumatology

## 2019-08-26 ENCOUNTER — Telehealth: Payer: Self-pay | Admitting: Rheumatology

## 2019-08-26 NOTE — Telephone Encounter (Signed)
Patient advised she should hold her MTX injection until she has completed her antibiotic and is well. Patient verbalized understanding.

## 2019-08-26 NOTE — Telephone Encounter (Signed)
Patient left a voicemail stating she is sick and was just prescribed an antibiotic from her PCP.  Patient is requesting a return call to let her know what she should do about her Methotrexate injection which is due today.

## 2019-09-04 ENCOUNTER — Ambulatory Visit: Payer: Medicare Other | Admitting: Cardiovascular Disease

## 2019-09-16 ENCOUNTER — Ambulatory Visit: Payer: Medicare Other | Admitting: Rheumatology

## 2019-09-16 ENCOUNTER — Other Ambulatory Visit: Payer: Self-pay | Admitting: Rheumatology

## 2019-09-16 NOTE — Telephone Encounter (Signed)
Patient has been having swelling with Right wrist since she stopped MTX for a week for an infection. Patient has since gone back to medication, but swelling has not gone down. Patient having trouble using hand because of swelling. Patient has rov 10/06/19. Please call to advise.

## 2019-09-16 NOTE — Telephone Encounter (Signed)
Patient states she needs a refill on MTX. Patient states she is on 0.4 mL. Patient states she had some in her right hand. Patient states she had to stop MTX for about 1 week due to having an infection. Patient states that now her hand and wrist is swelling much more. Patient does have a history of a right hand surgery in November 2020 with Dr. [name]. Please advise.   Last Visit: 08/04/2019 Next Visit: 10/06/2019 Labs: 07/20/2019 Her creatinine has gone up. She would not be able to increase methotrexate. She should stay on 0.4 mL subcu weekly.   DX: Seronegative rheumatoid arthritis    Okay to refill MTX?

## 2019-09-16 NOTE — Telephone Encounter (Signed)
Please schedule an appointment to discuss combination therapy or switching to another medication like arava that will not affect her renal function and that she can take the full dose of to prevent recurrent flares.

## 2019-09-17 ENCOUNTER — Telehealth: Payer: Self-pay | Admitting: *Deleted

## 2019-09-17 ENCOUNTER — Encounter: Payer: Self-pay | Admitting: *Deleted

## 2019-09-17 MED ORDER — METHOTREXATE SODIUM CHEMO INJECTION 50 MG/2ML: 10.0000 mg | INTRAMUSCULAR | 0 refills | Status: DC

## 2019-09-17 NOTE — Telephone Encounter (Signed)
Attempted to contact the patient and left message for patient to call the office.  

## 2019-09-17 NOTE — Telephone Encounter (Signed)
Patient advised she can resume MTX until her follow up visit. Patient advised prescription has been sent to the pharmacy.

## 2019-09-17 NOTE — Progress Notes (Signed)
Office Visit Note  Patient: [name]             Date of Birth: [birth year]           MRN: [id number]             PCP: Andi Devon, MD Referring: Andi Devon, MD Visit Date: 09/22/2019 Occupation: @GUAROCC @  Subjective:  Discussed medication options  History of Present Illness: is a 74 y.o. female seronegative rheumatoid arthritis, fibromyalgia, and DDD.  Patient is currently on methotrexate 0.4 mL of days injections once weekly and folic acid 1 mg by mouth daily.  She has been unable to increase the dose of methotrexate due to elevated creatinine.  She has been tolerating methotrexate without any side effects.  She continues to have recurrent flares on the low-dose of methotrexate.  Last week she was experiencing severe pain and swelling in her right wrist.  She was evaluated by Dr. 65 and had an aspiration and cortisone injection performed.  Her discomfort has started to improve.  Activities of Daily Living:  Patient reports morning stiffness for 0 minutes.   Patient Reports nocturnal pain.  Difficulty dressing/grooming: Denies Difficulty climbing stairs: Reports Difficulty getting out of chair: Reports Difficulty using hands for taps, buttons, cutlery, and/or writing: Reports  Review of Systems  Constitutional: Positive for fatigue.  HENT: Positive for mouth sores and mouth dryness. Negative for nose dryness.   Eyes: Positive for pain and dryness. Negative for redness, itching and visual disturbance.  Respiratory: Negative for cough, hemoptysis, shortness of breath and difficulty breathing.   Cardiovascular: Negative for chest pain, palpitations, hypertension and swelling in legs/feet.  Gastrointestinal: Negative for blood in stool, constipation and diarrhea.  Endocrine: Negative for increased urination.  Genitourinary: Negative for difficulty urinating and painful urination.  Musculoskeletal: Positive for arthralgias, joint pain,  joint swelling, myalgias, muscle tenderness and myalgias. Negative for muscle weakness and morning stiffness.  Skin: Negative for color change, pallor, rash, hair loss, nodules/bumps, redness, skin tightness, ulcers and sensitivity to sunlight.  Allergic/Immunologic: Negative for susceptible to infections.  Neurological: Positive for headaches. Negative for dizziness, numbness, memory loss and weakness.  Hematological: Positive for bruising/bleeding tendency. Negative for swollen glands.  Psychiatric/Behavioral: Negative for depressed mood, confusion and sleep disturbance. The patient is not nervous/anxious.     PMFS History:  Patient Active Problem List   Diagnosis Date Noted  . Leg pain, left 07/27/2014  . Cervical spondylosis with radiculopathy 01/24/2012  . EMPHYSEMA 08/05/2008  . LEUKOCYTOSIS 06/25/2008  . COPD 06/25/2008  . GASTROPARESIS 06/25/2008  . DM 06/14/2008  . HYPERLIPIDEMIA 06/14/2008  . Essential hypertension 06/14/2008  . ASTHMATIC BRONCHITIS, ACUTE 06/14/2008  . ALLERGIC RHINITIS 06/14/2008  . LUPUS 06/14/2008    Past Medical History:  Diagnosis Date  . Arthritis    "little bit qwhere" (01/24/2012)  . Asthma   . Chronic back pain    "neck to tailbone" (01/24/2012)  . Chronic bronchitis (HCC)    "last time ~ 1 month ago" (01/24/2012)  . COPD (chronic obstructive pulmonary disease) (HCC)   . Depression    takes Zoloft daily  . Fibromyalgia   . History of bladder infections    sees a urologist--on long term Macrodantin nightly  . History of blood transfusion    as a child  . History of blood transfusion    "when I was a child" (01/24/2012)  . Hyperlipidemia    takes Simvastatin daily  . Hypertension  takes Maxzide daily  . Hypothyroidism    takes Synthroid daily  . Insomnia    takes Ambien nightly  . Lupus (HCC)   . Neck pain    herniated disc and radiculopathy  . Peripheral neuropathy   . Pneumonia 2012   "have had it a couple times"  (01/24/2012)  . Smokers' cough (HCC)    has albuterol prn  . Type II diabetes mellitus (HCC)    takes Janumet daily  . Urinary frequency     Family History  Problem Relation Age of Onset  . Heart disease Mother   . Arrhythmia Father   . Heart disease Sister   . Heart attack Brother   . Diabetes Brother    Past Surgical History:  Procedure Laterality Date  . ANTERIOR CERVICAL DECOMP/DISCECTOMY FUSION  2002; 01/24/2012  . ANTERIOR CERVICAL DECOMP/DISCECTOMY FUSION  01/24/2012   Procedure: ANTERIOR CERVICAL DECOMPRESSION/DISCECTOMY FUSION 1 LEVEL;  Surgeon: Cristi Loron, MD;  Location: [facility];  Service: Neurosurgery;  Laterality: N/A;  CERVICAL THREE-FOUR anterior cervical decompression with fusion interbody prothesis plating and bonegraft  . APPENDECTOMY  1960's  . BACK SURGERY  2001  . BLADDER SURGERY  1977   through abdomen  . BLADDER SUSPENSION  1974  . BUNIONECTOMY  1980's   left  . CARDIAC CATHETERIZATION  2008  . COLONOSCOPY    . ESOPHAGOGASTRODUODENOSCOPY    . LUMBAR FUSION  2009  . RECTOCELE REPAIR  2000  . VAGINAL HYSTERECTOMY  1974   Social History   Social History Narrative  . Not on file   Immunization History  Administered Date(s) Administered  . Influenza Split 01/25/2012  . Influenza, High Dose Seasonal PF 01/01/2017  . Influenza-Unspecified 12/03/2012  . Pneumococcal Polysaccharide-23 01/25/2012     Objective: Vital Signs: BP (!) 142/64 (BP Location: Left Arm, Patient Position: Sitting, Cuff Size: Normal)   Pulse 66   Resp 14   Ht 5\' 5"  (1.651 m)   Wt 144 lb 3.2 oz (65.4 kg)   BMI 24.00 kg/m    Physical Exam Vitals and nursing note reviewed.  Constitutional:      Appearance: She is well-developed.  HENT:     Head: Normocephalic and atraumatic.  Eyes:     Conjunctiva/sclera: Conjunctivae normal.  Pulmonary:     Effort: Pulmonary effort is normal.  Abdominal:     General: Bowel sounds are normal.     Palpations: Abdomen is  soft.  Musculoskeletal:     Cervical back: Normal range of motion.  Lymphadenopathy:     Cervical: No cervical adenopathy.  Skin:    General: Skin is warm and dry.     Capillary Refill: Capillary refill takes less than 2 seconds.  Neurological:     Mental Status: She is alert and oriented to person, place, and time.  Psychiatric:        Behavior: Behavior normal.      Musculoskeletal Exam: C-spine limited range of motion.  Thoracic and lumbar spine have good range of motion.  Midline spinal tenderness in the lumbar region.  Shoulder joints and elbow joints have good range of motion with no tenderness or inflammation.  She has limited range of motion of the right wrist joint.  Tenderness over the volar aspect of the right wrist.  No tenderness of MCP or PIP joints.  She has complete fist formation bilaterally.  Right hip has limited range of motion.  Left hip has good range of motion with no discomfort.  Knee joints have good range of motion with no warmth or effusion.  Ankle joints have good range of motion with no tenderness or inflammation.  No tenderness of MTP joints.  CDAI Exam: CDAI Score: -- Patient Global: --; Provider Global: -- Swollen: --; Tender: -- Joint Exam 09/22/2019   No joint exam has been documented for this visit   There is currently no information documented on the homunculus. Go to the Rheumatology activity and complete the homunculus joint exam.  Investigation: No additional findings.  Imaging: No results found.  Recent Labs: Lab Results  Component Value Date   WBC 17.0 (H) 07/20/2019   HGB 13.4 07/20/2019   PLT 257 07/20/2019   NA 135 07/20/2019   K 4.2 07/20/2019   CL 97 (L) 07/20/2019   CO2 28 07/20/2019   GLUCOSE 99 07/20/2019   BUN 25 07/20/2019   CREATININE 1.06 (H) 07/20/2019   BILITOT 0.4 07/20/2019   ALKPHOS 72 01/12/2014   AST 12 07/20/2019   ALT 13 07/20/2019   PROT 7.1 07/20/2019   ALBUMIN 4.2 01/12/2014   CALCIUM 9.1 07/20/2019    GFRAA 60 07/20/2019   QFTBGOLDPLUS NEGATIVE 06/25/2019    Speciality Comments: No specialty comments available.  Procedures:  No procedures performed Allergies: Erythromycin, Penicillins, Gadolinium derivatives, Iodinated diagnostic agents, Latex, Morphine, Other, Prednisone, Sulfonamide derivatives, Varenicline tartrate, Atorvastatin, and Aspirin   Assessment / Plan:     Visit Diagnoses: Seronegative rheumatoid arthritis (HCC): She continues to have frequent flares due to uncontrolled rheumatoid arthritis.  She is currently on methotrexate 0.4 ml subcutaneous injections once weekly and folic acid 1 mg by mouth daily.  She has been unable to increase the dose of methotrexate due to elevated creatinine and low GFR.  Last week she was evaluated by Dr. Amanda PeaGramig due to severe flexor tenosynovitis of the right wrist.  According to the patient she had an aspiration and cortisone injection performed at the bedside.  She continues to have tenderness on examination today.  Due to the severity of her disease different treatment options were discussed.  She will be starting on Enbrel 50 mg subcutaneous injections once weekly.  Indications, contraindications, potential side effects of Enbrel were discussed today.  All questions were addressed and consent was obtained.  We will apply for Enbrel through her insurance and once approved she will return for nurse visit for the administration of the first injection.  She will continue on methotrexate 0.4 mL every injections once weekly and folic acid 1 mg by mouth daily.  She will follow-up in the office in 6 weeks to assess her response.  Medication counseling:   TB Test: Negative 06/25/19 Hepatitis panel: Negative 06/25/19 HIV:  Negative 06/25/19 SPEP: 06/25/19 Immunoglobulins: 06/25/19  Chest x-ray: 12/26/17 No active cardiopulmonary disease   Does patient have diagnosis of heart failure?  No  Counseled patient that Enbrel is a TNF blocking agent.  Reviewed  Enbrel dose of 50 mg once weekly.  Counseled patient on purpose, proper use, and adverse effects of Enbrel.  Reviewed the most common adverse effects including infections, headache, and injection site reactions. Discussed that there is the possibility of an increased risk of malignancy but it is not well understood if this increased risk is due to the medication or the disease state.  Advised patient to get yearly dermatology exams due to risk of skin cancer.  Reviewed the importance of regular labs while on Enbrel therapy.  Advised patient to get standing labs one month after starting  Enbrel then every 2 months.  Provided patient with standing lab orders.  Counseled patient that Enbrel should be held prior to scheduled surgery.  Counseled patient to avoid live vaccines while on Enbrel.  Advised patient to get annual influenza vaccine and the pneumococcal vaccine as needed.  Provided patient with medication education material and answered all questions.  Patient voiced understanding.  Patient consented to Enbrel.  Will upload consent into the media tab.  Reviewed storage instructions for Enbrel.  Advised initial injection must be administered in office.  Patient voiced understanding.    High risk medication use - MTX 0.4 ml sq wkly inj-low dose elevated creatinine.  We will be applying for Enbrel 50 mg subcu injections once weekly.  She will be due to update lab work in 1 month and every 3 months.  Standing orders for CBC and CMP are in place.  She has had all necessary baseline immunosuppressive labs.  Chest x-ray did not reveal any cardiopulmonary disease on 12/26/2017.  Tenosynovitis of fingers:   Tenosynovitis of right wrist: She continues to have recurrent flexor tenosynovitis of the right wrist.  She was evaluated by Dr. [name] last week and had an aspiration and cortisone injection performed.  She continues to have tenderness on examination today.  She will be starting on Enbrel 50 mg subcu injections  once weekly once approved.  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  Carotidynia  DDD (degenerative disc disease), cervical: She has limited range of motion with discomfort.  No symptoms of radiculopathy.  DDD (degenerative disc disease), lumbar: She experiences intermittent lower back pain.  She has midline spinal tenderness in lumbar region.  Fibromyalgia: She experiences intermittent myalgias and muscle tenderness due to underlying fibromyalgia.  She continues to have significant fatigue.  She has not been exercising recently.  We discussed the importance of regular exercise and good sleep hygiene.  Other medical conditions are listed as follows:   Oral candidiasis  Essential hypertension  History of hyperlipidemia  History of type 2 diabetes mellitus  History of diabetic gastroparesis  History of COPD  History of asthma  History of hypothyroidism  History of depression  Smoker  Orders: No orders of the defined types were placed in this encounter.  No orders of the defined types were placed in this encounter.   Face-to-face time spent with patient was 30 minutes. Greater than 50% of time was spent in counseling and coordination of care.  Follow-Up Instructions: Return in about 6 weeks (around 11/03/2019) for Rheumatoid arthritis.   Gearldine Bienenstock, PA-C   I examined and evaluated the patient with [name] PA.  Patient continues to have pain and swelling in her right hand.  We could not increase her methotrexate due to renal functions.  She is also concerned about increased risk of infections.  A detailed discussion about different treatment options including leflunomide and anti-TNF's.  She is more inclined towards going on anti-TNF's.  We discussed indication side effects contraindications of Enbrel and will proceed with Enbrel.  Handout was given, consent was taken and we will apply for Enbrel.  The plan of care was discussed as noted above.  Pollyann Savoy,  MD  Note - This record has been created using Animal nutritionist.  Chart creation errors have been sought, but may not always  have been located. Such creation errors do not reflect on  the standard of medical care.

## 2019-09-17 NOTE — Telephone Encounter (Signed)
She can resume MTX until her follow up visit.

## 2019-09-17 NOTE — Telephone Encounter (Signed)
Patient has been scheduled for an appointment on 09/22/2019 to discuss medication options. Patient would like to know if you will be refilling MTX prior to her appointment. Please advise.

## 2019-09-22 ENCOUNTER — Other Ambulatory Visit: Payer: Self-pay

## 2019-09-22 ENCOUNTER — Telehealth: Payer: Self-pay | Admitting: Pharmacist

## 2019-09-22 ENCOUNTER — Ambulatory Visit: Payer: Medicare Other | Admitting: Physician Assistant

## 2019-09-22 ENCOUNTER — Encounter: Payer: Self-pay | Admitting: Physician Assistant

## 2019-09-22 VITALS — BP 142/64 | HR 66 | Resp 14 | Ht 65.0 in | Wt 144.2 lb

## 2019-09-22 DIAGNOSIS — M659 Synovitis and tenosynovitis, unspecified: Secondary | ICD-10-CM

## 2019-09-22 DIAGNOSIS — L930 Discoid lupus erythematosus: Secondary | ICD-10-CM

## 2019-09-22 DIAGNOSIS — Z8639 Personal history of other endocrine, nutritional and metabolic disease: Secondary | ICD-10-CM

## 2019-09-22 DIAGNOSIS — B370 Candidal stomatitis: Secondary | ICD-10-CM

## 2019-09-22 DIAGNOSIS — M0600 Rheumatoid arthritis without rheumatoid factor, unspecified site: Secondary | ICD-10-CM

## 2019-09-22 DIAGNOSIS — M797 Fibromyalgia: Secondary | ICD-10-CM

## 2019-09-22 DIAGNOSIS — M5136 Other intervertebral disc degeneration, lumbar region: Secondary | ICD-10-CM

## 2019-09-22 DIAGNOSIS — M51369 Other intervertebral disc degeneration, lumbar region without mention of lumbar back pain or lower extremity pain: Secondary | ICD-10-CM

## 2019-09-22 DIAGNOSIS — Z8659 Personal history of other mental and behavioral disorders: Secondary | ICD-10-CM

## 2019-09-22 DIAGNOSIS — M65931 Unspecified synovitis and tenosynovitis, right forearm: Secondary | ICD-10-CM

## 2019-09-22 DIAGNOSIS — Z8709 Personal history of other diseases of the respiratory system: Secondary | ICD-10-CM

## 2019-09-22 DIAGNOSIS — G9001 Carotid sinus syncope: Secondary | ICD-10-CM

## 2019-09-22 DIAGNOSIS — F17200 Nicotine dependence, unspecified, uncomplicated: Secondary | ICD-10-CM

## 2019-09-22 DIAGNOSIS — Z79899 Other long term (current) drug therapy: Secondary | ICD-10-CM

## 2019-09-22 DIAGNOSIS — I10 Essential (primary) hypertension: Secondary | ICD-10-CM

## 2019-09-22 DIAGNOSIS — M65949 Unspecified synovitis and tenosynovitis, unspecified hand: Secondary | ICD-10-CM

## 2019-09-22 DIAGNOSIS — M5030 Other cervical disc degeneration, unspecified cervical region: Secondary | ICD-10-CM

## 2019-09-22 NOTE — Telephone Encounter (Signed)
Submitted a Prior Authorization request to [facility] for ENBREL via Cover My Meds. Will update once we receive a response.   (KeyAlvester Morin) - [id number]

## 2019-09-22 NOTE — Patient Instructions (Signed)
Standing Labs We placed an order today for your standing lab work.   Please have your standing labs drawn in 1 month then every 3 months   If possible, please have your labs drawn 2 weeks prior to your appointment so that the provider can discuss your results at your appointment.  We have open lab daily Monday through Thursday from 8:30-12:30 PM and 1:30-4:30 PM and Friday from 8:30-12:30 PM and 1:30-4:00 PM at the office of Dr. Shaili Deveshwar, [facility].   Please be advised, patients with office appointments requiring lab work will take precedents over walk-in lab work.  If possible, please come for your lab work on Monday and Friday afternoons, as you may experience shorter wait times. The office is located at [address], [address], [place], [place] [address] No appointment is necessary.   Labs are drawn by [facility]. Please bring your co-pay at the time of your lab draw.  You may receive a bill from [facility] for your lab work.  If you wish to have your labs drawn at another location, please call the office 24 hours in advance to send orders.  If you have any questions regarding directions or hours of operation,  please call [phone].   As a reminder, please drink plenty of water prior to coming for your lab work. Thanks!    Etanercept injection What is this medicine? ETANERCEPT (et a NER sept) is used for the treatment of rheumatoid arthritis in adults and children. The medicine is also used to treat psoriatic arthritis, ankylosing spondylitis, and psoriasis. This medicine may be used for other purposes; ask your health care provider or pharmacist if you have questions. COMMON BRAND NAME(S): Enbrel What should I tell my health care provider before I take this medicine? They need to know if you have any of these conditions:  blood disorders  cancer  congestive heart failure  diabetes  exposure to chickenpox  immune system problems  infection  multiple  sclerosis  seizure disorder  tuberculosis, a positive skin test for tuberculosis or have recently been in close contact with someone who has tuberculosis  Wegener's granulomatosis  an unusual or allergic reaction to etanercept, latex, other medicines, foods, dyes, or preservatives  pregnant or trying to get pregnant  breast-feeding How should I use this medicine? The medicine is given by injection under the skin. You will be taught how to prepare and give this medicine. Use exactly as directed. Take your medicine at regular intervals. Do not take your medicine more often than directed. It is important that you put your used needles and syringes in a special sharps container. Do not put them in a trash can. If you do not have a sharps container, call your pharmacist or healthcare provider to get one. A special MedGuide will be given to you by the pharmacist with each prescription and refill. Be sure to read this information carefully each time. Talk to your pediatrician regarding the use of this medicine in children. While this drug may be prescribed for children as young as 4 years of age for selected conditions, precautions do apply. Overdosage: If you think you have taken too much of this medicine contact a poison control center or emergency room at once. NOTE: This medicine is only for you. Do not share this medicine with others. What if I miss a dose? If you miss a dose, contact your health care professional to find out when you should take your next dose. Do not take   double or extra doses without advice. What may interact with this medicine? Do not take this medicine with any of the following medications:  anakinra This medicine may also interact with the following medications:  cyclophosphamide  sulfasalazine  vaccines This list may not describe all possible interactions. Give your health care provider a list of all the medicines, herbs, non-prescription drugs, or dietary  supplements you use. Also tell them if you smoke, drink alcohol, or use illegal drugs. Some items may interact with your medicine. What should I watch for while using this medicine? Tell your doctor or healthcare professional if your symptoms do not start to get better or if they get worse. You will be tested for tuberculosis (TB) before you start this medicine. If your doctor prescribes any medicine for TB, you should start taking the TB medicine before starting this medicine. Make sure to finish the full course of TB medicine. Call your doctor or health care professional for advice if you get a fever, chills or sore throat, or other symptoms of a cold or flu. Do not treat yourself. This drug decreases your body's ability to fight infections. Try to avoid being around people who are sick. What side effects may I notice from receiving this medicine? Side effects that you should report to your doctor or health care professional as soon as possible:  allergic reactions like skin rash, itching or hives, swelling of the face, lips, or tongue  changes in vision  fever, chills or any other sign of infection  numbness or tingling in legs or other parts of the body  red, scaly patches or raised bumps on the skin  shortness of breath or difficulty breathing  swollen lymph nodes in the neck, underarm, or groin areas  unexplained weight loss  unusual bleeding or bruising  unusual swelling or fluid retention in the legs  unusually weak or tired Side effects that usually do not require medical attention (report to your doctor or health care professional if they continue or are bothersome):  dizziness  headache  nausea  redness, itching, or swelling at the injection site  vomiting This list may not describe all possible side effects. Call your doctor for medical advice about side effects. You may report side effects to FDA at 1-800-FDA-1088. Where should I keep my medicine? Keep out of  the reach of children. Enbrel products: Store unopened Enbrel vials, cartridges, or pens in a refrigerator between 2 and 8 degrees C (36 and 46 degrees F). Do not freeze. Do not shake. Keep in the original container to protect from light. Throw away any unopened and unused medicine that has been stored in the refrigerator after the expiration date. If needed, you may store an Enbrel single-use vial, single-dose prefilled syringe, SureClick autoinjector, or Enbrel Mini cartridge at room temperature between 20 to 25 degrees C (68 to 77 degrees F) for up to 14 days. Protect from light, heat, and moisture. Do not shake. Once any of these items are stored at room temperature, do not place them back into the refrigerator. Throw the item away after 14 days, even if it still contains medicine. If you are using the Enbrel multi-dose vials, you will be instructed on how to dilute and store this medicine once it has been diluted. The Enbrel AutoTouch reusable autoinjector device should be stored at room temperature. Do NOT refrigerate the AutoTouch reusable autoinjector. Erelzi products: Store Erelzi prefilled syringes or Sensoready pen in the refrigerator between 2 and 8 degrees C (  36 and 46 degrees F). Do not freeze. Do not shake. Keep in the original container to protect from light. Throw away any unopened and unused medicine that has been stored in the refrigerator after the expiration date. If needed, you may store the Erelzi prefilled syringe or pen at room temperature between 20 to 25 degrees C (68 to 77 degrees F) for up to 28 days. Protect from light, heat, and moisture. Do not shake. Once any of these items are stored at room temperature, do not place them back into the refrigerator. Throw the item away after 28 days, even if it still contains medicine. NOTE: This sheet is a summary. It may not cover all possible information. If you have questions about this medicine, talk to your doctor, pharmacist, or  health care provider.  2020 Elsevier/Gold Standard (2018-05-19 09:49:58)  

## 2019-09-22 NOTE — Telephone Encounter (Signed)
Please start benefits investigation for Enbrel for the treatment of rheumatoid arthritis.  Patient has tried methotrexate but unable to increase to target dose due to elevated creatinine.  Patient has Medicare advantage plan and was given patient assistance to complete and return to the office.   Verlin Fester, PharmD, [place], CPP Clinical Specialty Pharmacist (Rheumatology and Pulmonology)  09/22/2019 10:29 AM

## 2019-09-22 NOTE — Telephone Encounter (Signed)
Received notification from [employer] regarding a prior authorization for ENBREL. Authorization has been APPROVED from 09/22/19 to 03/04/20.   Authorization # [id number]  Ran test claim, patient's copay is $1,689.64 for 1 month supply.

## 2019-09-22 NOTE — Progress Notes (Signed)
Pharmacy Note  Subjective: Patient presents today to the [facility]umatology for follow up office visit.  Patient seen by the pharmacist for counseling on Enbrel for rheumatoid arthritis.She was started on methotrexate but unable to increase to effective dose due to elevated creatinine.  Objective:  CBC    Component Value Date/Time   WBC 17.0 (H) 07/20/2019 1357   RBC 4.39 07/20/2019 1357   HGB 13.4 07/20/2019 1357   HCT 39.5 07/20/2019 1357   PLT 257 07/20/2019 1357   MCV 90.0 07/20/2019 1357   MCH 30.5 07/20/2019 1357   MCHC 33.9 07/20/2019 1357   RDW 14.3 07/20/2019 1357   LYMPHSABS 1,887 07/20/2019 1357   MONOABS 0.8 10/08/2015 1805   EOSABS 238 07/20/2019 1357   BASOSABS 102 07/20/2019 1357     CMP     Component Value Date/Time   NA 135 07/20/2019 1357   K 4.2 07/20/2019 1357   CL 97 (L) 07/20/2019 1357   CO2 28 07/20/2019 1357   GLUCOSE 99 07/20/2019 1357   BUN 25 07/20/2019 1357   CREATININE 1.06 (H) 07/20/2019 1357   CALCIUM 9.1 07/20/2019 1357   PROT 7.1 07/20/2019 1357   ALBUMIN 4.2 01/12/2014 1805   AST 12 07/20/2019 1357   ALT 13 07/20/2019 1357   ALKPHOS 72 01/12/2014 1805   BILITOT 0.4 07/20/2019 1357   GFRNONAA 52 (L) 07/20/2019 1357   GFRAA 60 07/20/2019 1357     Baseline Immunosuppressant Therapy Labs TB GOLD Quantiferon TB Gold Latest Ref Rng & Units 06/25/2019  Quantiferon TB Gold Plus NEGATIVE NEGATIVE   Hepatitis Panel Hepatitis Latest Ref Rng & Units 06/25/2019  Hep B Surface Ag NON-REACTI NON-REACTIVE  Hep B IgM NON-REACTI NON-REACTIVE  Hep C Ab NON-REACTI NON-REACTIVE  Hep C Ab NON-REACTI NON-REACTIVE   HIV Lab Results  Component Value Date   HIV NON-REACTIVE 06/25/2019   Immunoglobulins Immunoglobulin Electrophoresis Latest Ref Rng & Units 06/25/2019  IgA  70 - 320 mg/dL 425(Z)  IgG 563 - 8,756 mg/dL 4,332  IgM 50 - 951 mg/dL 884   SPEP Serum Protein Electrophoresis Latest Ref Rng & Units 07/20/2019  Total Protein 6.1 -  8.1 g/dL 7.1  Albumin 3.8 - 4.8 g/dL -  Alpha-1 0.2 - 0.3 g/dL -  Alpha-2 0.5 - 0.9 g/dL -  Beta Globulin 0.4 - 0.6 g/dL -  Beta 2 0.2 - 0.5 g/dL -  Gamma Globulin 0.8 - 1.7 g/dL -   Z6SA No results found for: G6PDH TPMT Lab Results  Component Value Date   TPMT 15 06/25/2019     Chest x-ray: no active cardiopulmonary disease.  Does patient have diagnosis of heart failure?  No  Assessment/Plan:  Counseled patient that Enbrel is a TNF blocking agent. Counseled patient on purpose, proper use, and adverse effects of Enbrel.  Reviewed the most common adverse effects including infections, headache, and injection site reactions. Discussed that there is the possibility of an increased risk of malignancy but it is not well understood if this increased risk is due to the medication or the disease state.  Advised patient to get yearly dermatology exams due to risk of skin cancer.  Counseled patient that Enbrel should be held prior to scheduled surgery.  Counseled patient to avoid live vaccines while on Enbrel. Recommend annual influenza, Pneumovax 23, Prevnar 13, and Shingrix as indicated.   Reviewed the importance of regular labs while on Enbrel therapy.  Advised patient to get standing labs one month after starting Enbrel then  every 2 months.  Provided patient with standing lab orders.  Provided patient with medication education material and answered all questions.  Patient voiced understanding.  Patient consented to Enbrel.  Will upload consent into the media tab.  Reviewed storage instructions for Enbrel.  Advised initial injection must be administered in office.  Patient voiced understanding.    Patient dose will be Enbrel 50 mg every 7 days.  Prescription pending labs and/or insurance.  Patient has Medicare advantage plan and will likely need patient assistance.  Patient states she has a household size of 2 with annual gross income of around $43,000.  Patient given application and she will return  to the office next week along with income documents.  She will continue methotrexate 0.4 ml at this time.  All questions encouraged and answered.  Instructed patient to call with any questions or concerns.   Verlin Fester, PharmD, [place], CPP Clinical Specialty Pharmacist (Rheumatology and Pulmonology)  09/22/2019 10:28 AM

## 2019-09-30 NOTE — Telephone Encounter (Signed)
Called to notify patient of Enbrel approval and monthly co-pay.  Advised we will need to proceed with patient assistance.  Patient states she filled out the application and has gathered her income documents.  She will drop them by our office tomorrow morning.  Once received will fax to [employer] safety net foundation for approval.   Verlin Fester, PharmD, BCACP, CPP Clinical Specialty Pharmacist (Rheumatology and Pulmonology)  09/30/2019 10:29 AM

## 2019-10-01 NOTE — Telephone Encounter (Signed)
Submitted Patient Assistance Application to [employer] for ENBREL along with provider portion, PA and income documents. Will update patient when we receive a response.  Fax# [phone] Phone# [phone]  

## 2019-10-06 ENCOUNTER — Ambulatory Visit: Payer: Medicare Other | Admitting: Physician Assistant

## 2019-10-06 NOTE — Telephone Encounter (Signed)
Called [employer], patient's application is still in insurance review.

## 2019-10-07 NOTE — Telephone Encounter (Signed)
Received a fax from  [employer] regarding an approval for ENBREL from 10/06/19 to 03/04/20.   Patient-Case ID: [id number]   Verlin Fester, PharmD, BCACP, CPP Clinical Specialty Pharmacist (Rheumatology and Pulmonology)  10/07/2019 8:58 AM

## 2019-10-07 NOTE — Telephone Encounter (Signed)
Called patient to notify of approval and schedule new start appointment. Patient states she lives an hour away and has a hectic schedule and would like to wait until her follow-up appointment on 11/03/2019.   Verlin Fester, PharmD, [place], CPP Clinical Specialty Pharmacist (Rheumatology and Pulmonology)  10/07/2019 9:30 AM

## 2019-10-21 NOTE — Progress Notes (Deleted)
Office Visit Note  Patient: [name]             Date of Birth: 1945/12/19           MRN: [id number]             PCP: Andi Devon, MD Referring: Andi Devon, MD Visit Date: 11/03/2019 Occupation: @GUAROCC @  Subjective:  No chief complaint on file.   History of Present Illness: is a 74 y.o. female ***   Activities of Daily Living:  Patient reports morning stiffness for *** {minute/hour:19697}.   Patient {ACTIONS;DENIES/REPORTS:21021675::"Denies"} nocturnal pain.  Difficulty dressing/grooming: {ACTIONS;DENIES/REPORTS:21021675::"Denies"} Difficulty climbing stairs: {ACTIONS;DENIES/REPORTS:21021675::"Denies"} Difficulty getting out of chair: {ACTIONS;DENIES/REPORTS:21021675::"Denies"} Difficulty using hands for taps, buttons, cutlery, and/or writing: {ACTIONS;DENIES/REPORTS:21021675::"Denies"}  No Rheumatology ROS completed.   PMFS History:  Patient Active Problem List   Diagnosis Date Noted  . Leg pain, left 07/27/2014  . Cervical spondylosis with radiculopathy 01/24/2012  . EMPHYSEMA 08/05/2008  . LEUKOCYTOSIS 06/25/2008  . COPD 06/25/2008  . GASTROPARESIS 06/25/2008  . DM 06/14/2008  . HYPERLIPIDEMIA 06/14/2008  . Essential hypertension 06/14/2008  . ASTHMATIC BRONCHITIS, ACUTE 06/14/2008  . ALLERGIC RHINITIS 06/14/2008  . LUPUS 06/14/2008    Past Medical History:  Diagnosis Date  . Arthritis    "little bit qwhere" (01/24/2012)  . Asthma   . Chronic back pain    "neck to tailbone" (01/24/2012)  . Chronic bronchitis (HCC)    "last time ~ 1 month ago" (01/24/2012)  . COPD (chronic obstructive pulmonary disease) (HCC)   . Depression    takes Zoloft daily  . Fibromyalgia   . History of bladder infections    sees a urologist--on long term Macrodantin nightly  . History of blood transfusion    as a child  . History of blood transfusion    "when I was a child" (01/24/2012)  . Hyperlipidemia    takes Simvastatin daily   . Hypertension    takes Maxzide daily  . Hypothyroidism    takes Synthroid daily  . Insomnia    takes Ambien nightly  . Lupus (HCC)   . Neck pain    herniated disc and radiculopathy  . Peripheral neuropathy   . Pneumonia 2012   "have had it a couple times" (01/24/2012)  . Smokers' cough (HCC)    has albuterol prn  . Type II diabetes mellitus (HCC)    takes Janumet daily  . Urinary frequency     Family History  Problem Relation Age of Onset  . Heart disease Mother   . Arrhythmia Father   . Heart disease Sister   . Heart attack Brother   . Diabetes Brother    Past Surgical History:  Procedure Laterality Date  . ANTERIOR CERVICAL DECOMP/DISCECTOMY FUSION  2002; 01/24/2012  . ANTERIOR CERVICAL DECOMP/DISCECTOMY FUSION  01/24/2012   Procedure: ANTERIOR CERVICAL DECOMPRESSION/DISCECTOMY FUSION 1 LEVEL;  Surgeon: 01/26/2012, MD;  Location: [facility];  Service: Neurosurgery;  Laterality: N/A;  CERVICAL THREE-FOUR anterior cervical decompression with fusion interbody prothesis plating and bonegraft  . APPENDECTOMY  1960's  . BACK SURGERY  2001  . BLADDER SURGERY  1977   through abdomen  . BLADDER SUSPENSION  1974  . BUNIONECTOMY  1980's   left  . CARDIAC CATHETERIZATION  2008  . COLONOSCOPY    . ESOPHAGOGASTRODUODENOSCOPY    . LUMBAR FUSION  2009  . RECTOCELE REPAIR  2000  . VAGINAL HYSTERECTOMY  1974   Social History   Social  History Narrative  . Not on file   Immunization History  Administered Date(s) Administered  . Influenza Split 01/25/2012  . Influenza, High Dose Seasonal PF 01/01/2017  . Influenza-Unspecified 12/03/2012  . Pneumococcal Polysaccharide-23 01/25/2012     Objective: Vital Signs: There were no vitals taken for this visit.   Physical Exam   Musculoskeletal Exam: ***  CDAI Exam: CDAI Score: -- Patient Global: --; Provider Global: -- Swollen: --; Tender: -- Joint Exam 11/03/2019   No joint exam has been documented for this  visit   There is currently no information documented on the homunculus. Go to the Rheumatology activity and complete the homunculus joint exam.  Investigation: No additional findings.  Imaging: No results found.  Recent Labs: Lab Results  Component Value Date   WBC 17.0 (H) 07/20/2019   HGB 13.4 07/20/2019   PLT 257 07/20/2019   NA 135 07/20/2019   K 4.2 07/20/2019   CL 97 (L) 07/20/2019   CO2 28 07/20/2019   GLUCOSE 99 07/20/2019   BUN 25 07/20/2019   CREATININE 1.06 (H) 07/20/2019   BILITOT 0.4 07/20/2019   ALKPHOS 72 01/12/2014   AST 12 07/20/2019   ALT 13 07/20/2019   PROT 7.1 07/20/2019   ALBUMIN 4.2 01/12/2014   CALCIUM 9.1 07/20/2019   GFRAA 60 07/20/2019   QFTBGOLDPLUS NEGATIVE 06/25/2019    Speciality Comments: No specialty comments available.  Procedures:  No procedures performed Allergies: Erythromycin, Penicillins, Gadolinium derivatives, Iodinated diagnostic agents, Latex, Morphine, Other, Prednisone, Sulfonamide derivatives, Varenicline tartrate, Atorvastatin, and Aspirin   Assessment / Plan:     Visit Diagnoses: No diagnosis found.  Orders: No orders of the defined types were placed in this encounter.  No orders of the defined types were placed in this encounter.   Face-to-face time spent with patient was *** minutes. Greater than 50% of time was spent in counseling and coordination of care.  Follow-Up Instructions: No follow-ups on file.   Ellen Henri, CMA  Note - This record has been created using Animal nutritionist.  Chart creation errors have been sought, but may not always  have been located. Such creation errors do not reflect on  the standard of medical care.

## 2019-10-22 ENCOUNTER — Telehealth: Payer: Self-pay | Admitting: Rheumatology

## 2019-10-22 NOTE — Telephone Encounter (Signed)
Patient advised to seek an evaluation at urgent care. Patient also advised to hold MTX until she is feeling well.

## 2019-10-22 NOTE — Telephone Encounter (Signed)
Patient took last MTX dose yesterday. Patient woke up with neck glands swollen, sore throat, and general ill feeling. Per patient, [place] until 11[place]13/19.  Please call patient to advise.

## 2019-10-24 ENCOUNTER — Emergency Department (HOSPITAL_COMMUNITY)
Admission: EM | Admit: 2019-10-24 | Discharge: 2019-10-24 | Disposition: A | Discharge status: Home / Self Care | Payer: Medicare Other

## 2019-10-24 ENCOUNTER — Other Ambulatory Visit: Payer: Self-pay

## 2019-10-24 NOTE — ED Notes (Signed)
No answer in waiting room X3,  

## 2019-10-24 NOTE — ED Notes (Signed)
No answer in waiting room X1,  

## 2019-10-28 ENCOUNTER — Telehealth: Payer: Self-pay | Admitting: Rheumatology

## 2019-10-28 NOTE — Telephone Encounter (Signed)
She does not need labs prior to starting Enbrel.  We will obtain labs 1 month after starting Enbrel.

## 2019-10-28 NOTE — Telephone Encounter (Signed)
Thank you for notifying me.  We will further discuss with the patient at her upcoming office visit on 11/03/19.

## 2019-10-28 NOTE — Telephone Encounter (Signed)
Patient advised she does not need labs prior to starting Enbrel.  We will obtain labs 1 month after starting Enbrel.   Patient states she is no longer going to take the MTX. Patient states she spent 6 hours in the emergency room. Patient states she had a severe injection in her throat and her mouth. Patient states she also had bronchitis. Patient just recently finished the antibiotics and steroids given.

## 2019-10-28 NOTE — Telephone Encounter (Signed)
Patient calling to see if she needs to get lab work before she has appointment for first Enbrel dose? Please call to advise.

## 2019-11-03 ENCOUNTER — Ambulatory Visit: Payer: Medicare Other | Admitting: Physician Assistant

## 2019-11-03 DIAGNOSIS — M5136 Other intervertebral disc degeneration, lumbar region: Secondary | ICD-10-CM

## 2019-11-03 DIAGNOSIS — M659 Synovitis and tenosynovitis, unspecified: Secondary | ICD-10-CM

## 2019-11-03 DIAGNOSIS — Z79899 Other long term (current) drug therapy: Secondary | ICD-10-CM

## 2019-11-03 DIAGNOSIS — Z8639 Personal history of other endocrine, nutritional and metabolic disease: Secondary | ICD-10-CM

## 2019-11-03 DIAGNOSIS — M797 Fibromyalgia: Secondary | ICD-10-CM

## 2019-11-03 DIAGNOSIS — L930 Discoid lupus erythematosus: Secondary | ICD-10-CM

## 2019-11-03 DIAGNOSIS — G9001 Carotid sinus syncope: Secondary | ICD-10-CM

## 2019-11-03 DIAGNOSIS — Z8709 Personal history of other diseases of the respiratory system: Secondary | ICD-10-CM

## 2019-11-03 DIAGNOSIS — B370 Candidal stomatitis: Secondary | ICD-10-CM

## 2019-11-03 DIAGNOSIS — F17200 Nicotine dependence, unspecified, uncomplicated: Secondary | ICD-10-CM

## 2019-11-03 DIAGNOSIS — I10 Essential (primary) hypertension: Secondary | ICD-10-CM

## 2019-11-03 DIAGNOSIS — Z8659 Personal history of other mental and behavioral disorders: Secondary | ICD-10-CM

## 2019-11-03 DIAGNOSIS — M0600 Rheumatoid arthritis without rheumatoid factor, unspecified site: Secondary | ICD-10-CM

## 2019-11-03 DIAGNOSIS — M5030 Other cervical disc degeneration, unspecified cervical region: Secondary | ICD-10-CM

## 2019-11-03 NOTE — Progress Notes (Signed)
Office Visit Note  Patient: [name]             Date of Birth: 01/31/1946           MRN: [id number]             PCP: Andi DevonShelton, Kimberly, MD Referring: Andi DevonShelton, Kimberly, MD Visit Date: 11/05/2019 Occupation: @GUAROCC @  Subjective:  Starting on Enbrel   History of Present Illness: [name] is a 74 y.o. female with history of seronegative rheumatoid arthritis, osteoarthritis, and fibromyalgia.  She was previously on MTX 0.4 ml sq once weekly but discontinued 2 weeks due to being diagnosed with bronchitis.  Her symptoms have resolved but she did not want to restart.  She states she has gained 20 lbs since starting on MTX.  The plan is to start her on Enbrel 50 mg subcutaneous injections today while in the office.  She does not have any further questions about enbrel.  Patient ports she continues to have intermittent pain in both hands but denies any joint swelling at this time.  She was evaluated by Dr. Amanda PeaGramig recently who felt that her arthritis is well controlled at this time she states that she has chronic lower back pain which she attributes to spinal stenosis.  She continues to have chronic neck pain and stiffness.   Activities of Daily Living:  Patient reports morning stiffness for 20-30  minutes.   Patient Reports nocturnal pain.  Difficulty dressing/grooming: Denies Difficulty climbing stairs: Reports Difficulty getting out of chair: Reports Difficulty using hands for taps, buttons, cutlery, and/or writing: Denies  Review of Systems  Constitutional: Negative for fatigue.  HENT: Positive for mouth sores and mouth dryness. Negative for nose dryness.   Eyes: Positive for dryness. Negative for pain and visual disturbance.  Respiratory: Negative for cough, hemoptysis, shortness of breath and difficulty breathing.   Cardiovascular: Negative for chest pain, palpitations, hypertension and swelling in legs/feet.  Gastrointestinal: Negative for blood in stool,  constipation and diarrhea.  Endocrine: Negative for increased urination.  Genitourinary: Negative for painful urination.  Musculoskeletal: Positive for arthralgias, joint pain and morning stiffness. Negative for joint swelling, myalgias, muscle weakness, muscle tenderness and myalgias.  Skin: Negative for color change, pallor, rash, hair loss, nodules/bumps, skin tightness, ulcers and sensitivity to sunlight.  Allergic/Immunologic: Negative for susceptible to infections.  Neurological: Negative for dizziness, numbness, headaches and weakness.  Hematological: Negative for swollen glands.  Psychiatric/Behavioral: Negative for depressed mood and sleep disturbance. The patient is not nervous/anxious.     PMFS History:  Patient Active Problem List   Diagnosis Date Noted  . Leg pain, left 07/27/2014  . Cervical spondylosis with radiculopathy 01/24/2012  . EMPHYSEMA 08/05/2008  . LEUKOCYTOSIS 06/25/2008  . COPD 06/25/2008  . GASTROPARESIS 06/25/2008  . DM 06/14/2008  . HYPERLIPIDEMIA 06/14/2008  . Essential hypertension 06/14/2008  . ASTHMATIC BRONCHITIS, ACUTE 06/14/2008  . ALLERGIC RHINITIS 06/14/2008  . LUPUS 06/14/2008    Past Medical History:  Diagnosis Date  . Arthritis    "little bit qwhere" (01/24/2012)  . Asthma   . Chronic back pain    "neck to tailbone" (01/24/2012)  . Chronic bronchitis (HCC)    "last time ~ 1 month ago" (01/24/2012)  . COPD (chronic obstructive pulmonary disease) (HCC)   . Depression    takes Zoloft daily  . Fibromyalgia   . History of bladder infections    sees a urologist--on long term Macrodantin nightly  . History of blood transfusion  as a child  . History of blood transfusion    "when I was a child" (01/24/2012)  . Hyperlipidemia    takes Simvastatin daily  . Hypertension    takes Maxzide daily  . Hypothyroidism    takes Synthroid daily  . Insomnia    takes Ambien nightly  . Lupus (HCC)   . Neck pain    herniated disc and  radiculopathy  . Peripheral neuropathy   . Pneumonia 2012   "have had it a couple times" (01/24/2012)  . Smokers' cough (HCC)    has albuterol prn  . Type II diabetes mellitus (HCC)    takes Janumet daily  . Urinary frequency     Family History  Problem Relation Age of Onset  . Heart disease Mother   . Arrhythmia Father   . Heart disease Sister   . Heart attack Brother   . Diabetes Brother    Past Surgical History:  Procedure Laterality Date  . ANTERIOR CERVICAL DECOMP/DISCECTOMY FUSION  2002; 01/24/2012  . ANTERIOR CERVICAL DECOMP/DISCECTOMY FUSION  01/24/2012   Procedure: ANTERIOR CERVICAL DECOMPRESSION/DISCECTOMY FUSION 1 LEVEL;  Surgeon: Cristi Loron, MD;  Location: [facility];  Service: Neurosurgery;  Laterality: N/A;  CERVICAL THREE-FOUR anterior cervical decompression with fusion interbody prothesis plating and bonegraft  . APPENDECTOMY  1960's  . BACK SURGERY  2001  . BLADDER SURGERY  1977   through abdomen  . BLADDER SUSPENSION  1974  . BUNIONECTOMY  1980's   left  . CARDIAC CATHETERIZATION  2008  . COLONOSCOPY    . ESOPHAGOGASTRODUODENOSCOPY    . LUMBAR FUSION  2009  . RECTOCELE REPAIR  2000  . VAGINAL HYSTERECTOMY  1974   Social History   Social History Narrative  . Not on file   Immunization History  Administered Date(s) Administered  . Influenza Split 01/25/2012  . Influenza, High Dose Seasonal PF 01/01/2017  . Influenza-Unspecified 12/03/2012  . Pneumococcal Polysaccharide-23 01/25/2012     Objective: Vital Signs: BP (!) 149/90 (BP Location: Left Arm, Patient Position: Sitting, Cuff Size: Normal)   Pulse 79   Resp 16   Ht 5\' 5"  (1.651 m)   Wt 147 lb 6.4 oz (66.9 kg)   BMI 24.53 kg/m    Physical Exam Vitals and nursing note reviewed.  Constitutional:      Appearance: She is well-developed.  HENT:     Head: Normocephalic and atraumatic.  Eyes:     Conjunctiva/sclera: Conjunctivae normal.  Pulmonary:     Effort: Pulmonary effort is  normal.  Abdominal:     Palpations: Abdomen is soft.  Musculoskeletal:     Cervical back: Normal range of motion.  Skin:    General: Skin is warm and dry.     Capillary Refill: Capillary refill takes less than 2 seconds.  Neurological:     Mental Status: She is alert and oriented to person, place, and time.  Psychiatric:        Behavior: Behavior normal.      Musculoskeletal Exam: C-spine limited range of motion.  Thoracic kyphosis noted.  Shoulder joints and elbow joints have good range of motion with no discomfort.  Limited range of motion of the right wrist joint.  She has tenderness of both CMC joints.  No tenderness or synovitis of MCP joints.  Knee joints have good range of motion with no warmth or effusion.  Ankle joints have good range of motion with no tenderness or inflammation.  No tenderness of MTP joints.  CDAI Exam: CDAI Score: 0.6  Patient Global: 3 mm; Provider Global: 3 mm Swollen: 0 ; Tender: 0  Joint Exam 11/05/2019   No joint exam has been documented for this visit   There is currently no information documented on the homunculus. Go to the Rheumatology activity and complete the homunculus joint exam.  Investigation: No additional findings.  Imaging: No results found.  Recent Labs: Lab Results  Component Value Date   WBC 17.0 (H) 07/20/2019   HGB 13.4 07/20/2019   PLT 257 07/20/2019   NA 135 07/20/2019   K 4.2 07/20/2019   CL 97 (L) 07/20/2019   CO2 28 07/20/2019   GLUCOSE 99 07/20/2019   BUN 25 07/20/2019   CREATININE 1.06 (H) 07/20/2019   BILITOT 0.4 07/20/2019   ALKPHOS 72 01/12/2014   AST 12 07/20/2019   ALT 13 07/20/2019   PROT 7.1 07/20/2019   ALBUMIN 4.2 01/12/2014   CALCIUM 9.1 07/20/2019   GFRAA 60 07/20/2019   QFTBGOLDPLUS NEGATIVE 06/25/2019    Speciality Comments: No specialty comments available.  Procedures:  No procedures performed Allergies: Erythromycin, Penicillins, Gadolinium derivatives, Iodinated diagnostic agents,  Latex, Morphine, Other, Prednisone, Sulfonamide derivatives, Varenicline tartrate, Atorvastatin, and Aspirin   Assessment / Plan:     Visit Diagnoses: Seronegative rheumatoid arthritis (HCC): She has no synovitis on exam.  She has been experiencing recurrent flares but she was recently prescribed a Medrol Dosepak for bronchitis which likely helped with her joint pain and inflammation.  She continues to experience intermittent pain in both hands.  She discontinued methotrexate 0.4 mL subcutaneous injections about 2 weeks ago after being diagnosed with bronchitis and did not restart.  She completed the antibiotics and Medrol Dosepak and her symptoms have resolved.  She presents today for the administration of the first Enbrel injection.  She was monitored for 30 minutes after the injection without any reaction.  She will continue on Enbrel 50 mg subcutaneous injections once weekly as monotherapy.  She was advised to notify us if she cannot tolerate Enbrel.  We will see her back in the office in 6 weeks to assess her response.  High risk medication use -Starting on Enbrel 50 mg sq injections once weekly.  D/c MTX 0.4 ml sq wkly inj-low dose elevated creatinine.  CBC and CMP were drawn on 07/20/2019.  She will be due to update lab work in 1 month and every 3 months to monitor for drug toxicity.  She has had all baseline immunosuppressive lab work.  TB gold was negative on 06/25/2019 and will continue to be monitored yearly.- Plan: CBC with Differential/Platelet, COMPLETE METABOLIC PANEL WITH GFR She has not received the COVID-19 vaccinations.   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  No recent rashes.  DDD (degenerative disc disease), cervical: She has limited range of motion of the C-spine on exam.  She has no symptoms of radiculopathy at this time.  DDD (degenerative disc disease), lumbar: h/o spinal stenosis-She has  chronic lower back pain and stiffness.  She experiences intermittent weakness in her right lower extremity.   Fibromyalgia: She is not experiencing any increased muscle tenderness or myalgias.  Her fatigue has been stable.  Other medical conditions are listed as follows:   Oral candidiasis  Essential hypertension  History of hyperlipidemia  History of type 2 diabetes mellitus  History of diabetic gastroparesis  History of COPD  History of asthma  History  of hypothyroidism  History of depression  Smoker  Orders: Orders Placed This Encounter  Procedures  . CBC with Differential/Platelet  . COMPLETE METABOLIC PANEL WITH GFR   No orders of the defined types were placed in this encounter.     Follow-Up Instructions: Return in about 6 weeks (around 12/17/2019) for Rheumatoid arthritis, Osteoarthritis, Fibromyalgia.   Gearldine Bienenstock, PA-C  Note - This record has been created using Dragon software.  Chart creation errors have been sought, but may not always  have been located. Such creation errors do not reflect on  the standard of medical care.

## 2019-11-05 ENCOUNTER — Other Ambulatory Visit: Payer: Self-pay

## 2019-11-05 ENCOUNTER — Ambulatory Visit: Payer: Medicare Other | Admitting: Physician Assistant

## 2019-11-05 ENCOUNTER — Encounter: Payer: Self-pay | Admitting: Physician Assistant

## 2019-11-05 VITALS — BP 149/90 | HR 79 | Resp 16 | Ht 65.0 in | Wt 147.4 lb

## 2019-11-05 DIAGNOSIS — Z79899 Other long term (current) drug therapy: Secondary | ICD-10-CM | POA: Diagnosis not present

## 2019-11-05 DIAGNOSIS — M19049 Primary osteoarthritis, unspecified hand: Secondary | ICD-10-CM | POA: Insufficient documentation

## 2019-11-05 DIAGNOSIS — B370 Candidal stomatitis: Secondary | ICD-10-CM

## 2019-11-05 DIAGNOSIS — M51369 Other intervertebral disc degeneration, lumbar region without mention of lumbar back pain or lower extremity pain: Secondary | ICD-10-CM

## 2019-11-05 DIAGNOSIS — M659 Synovitis and tenosynovitis, unspecified: Secondary | ICD-10-CM | POA: Diagnosis not present

## 2019-11-05 DIAGNOSIS — M5136 Other intervertebral disc degeneration, lumbar region: Secondary | ICD-10-CM

## 2019-11-05 DIAGNOSIS — Z8639 Personal history of other endocrine, nutritional and metabolic disease: Secondary | ICD-10-CM

## 2019-11-05 DIAGNOSIS — L930 Discoid lupus erythematosus: Secondary | ICD-10-CM | POA: Diagnosis not present

## 2019-11-05 DIAGNOSIS — M797 Fibromyalgia: Secondary | ICD-10-CM

## 2019-11-05 DIAGNOSIS — M0600 Rheumatoid arthritis without rheumatoid factor, unspecified site: Secondary | ICD-10-CM | POA: Diagnosis not present

## 2019-11-05 DIAGNOSIS — I10 Essential (primary) hypertension: Secondary | ICD-10-CM

## 2019-11-05 DIAGNOSIS — M19042 Primary osteoarthritis, left hand: Secondary | ICD-10-CM | POA: Insufficient documentation

## 2019-11-05 DIAGNOSIS — G9001 Carotid sinus syncope: Secondary | ICD-10-CM

## 2019-11-05 DIAGNOSIS — M5030 Other cervical disc degeneration, unspecified cervical region: Secondary | ICD-10-CM

## 2019-11-05 DIAGNOSIS — M65949 Unspecified synovitis and tenosynovitis, unspecified hand: Secondary | ICD-10-CM

## 2019-11-05 DIAGNOSIS — M19041 Primary osteoarthritis, right hand: Secondary | ICD-10-CM | POA: Insufficient documentation

## 2019-11-05 DIAGNOSIS — M65931 Unspecified synovitis and tenosynovitis, right forearm: Secondary | ICD-10-CM

## 2019-11-05 DIAGNOSIS — Z8659 Personal history of other mental and behavioral disorders: Secondary | ICD-10-CM

## 2019-11-05 DIAGNOSIS — Z8709 Personal history of other diseases of the respiratory system: Secondary | ICD-10-CM

## 2019-11-05 DIAGNOSIS — F17200 Nicotine dependence, unspecified, uncomplicated: Secondary | ICD-10-CM

## 2019-11-05 NOTE — Patient Instructions (Addendum)
Standing Labs We placed an order today for your standing lab work.   Please have your standing labs drawn in 1 month and every 3 months   If possible, please have your labs drawn 2 weeks prior to your appointment so that the provider can discuss your results at your appointment.  We have open lab daily Monday through Thursday from 8:30-12:30 PM and 1:30-4:30 PM and Friday from 8:30-12:30 PM and 1:30-4:00 PM at the office of Dr. Shaili Deveshwar, [facility].   Please be advised, patients with office appointments requiring lab work will take precedents over walk-in lab work.  If possible, please come for your lab work on Monday and Friday afternoons, as you may experience shorter wait times. The office is located at [address], [address], [place], [place] [address] No appointment is necessary.   Labs are drawn by [facility]. Please bring your co-pay at the time of your lab draw.  You may receive a bill from [facility] for your lab work.  If you wish to have your labs drawn at another location, please call the office 24 hours in advance to send orders.  If you have any questions regarding directions or hours of operation,  please call [phone].   As a reminder, please drink plenty of water prior to coming for your lab work. Thanks!  COVID-19 vaccine recommendations:   COVID-19 vaccine is recommended for everyone (unless you are allergic to a vaccine component), even if you are on a medication that suppresses your immune system.   If you are on Methotrexate, Cellcept (mycophenolate), Rinvoq, Xeljanz, and Olumiant- hold the medication for 1 week after each vaccine. Hold Methotrexate for 2 weeks after the single dose COVID-19 vaccine.   If you are on Orencia subcutaneous injection - hold medication one week prior to and one week after the first COVID-19 vaccine dose (only).   If you are on Orencia IV infusions- time vaccination administration so that the first COVID-19 vaccination  will occur four weeks after the infusion and postpone the subsequent infusion by one week.   If you are on Cyclophosphamide or Rituxan infusions please contact your doctor prior to receiving the COVID-19 vaccine.   Do not take Tylenol or any anti-inflammatory medications (NSAIDs) 24 hours prior to the COVID-19 vaccination.   There is no direct evidence about the efficacy of the COVID-19 vaccine in individuals who are on medications that suppress the immune system.   Even if you are fully vaccinated, and you are on any medications that suppress your immune system, please continue to wear a mask, maintain at least six feet social distance and practice hand hygiene.   If you develop a COVID-19 infection, please contact your PCP or our office to determine if you need antibody infusion.  The booster vaccine is now available for immunocompromised patients. It is advised that if you had Pfizer vaccine you should get Pfizer booster.  If you had a Moderna vaccine then you should get a Moderna booster. Johnson and Johnson does not have a booster vaccine at this time.  Please see the following web sites for updated information.   [email or web address]  [email or web address]   

## 2019-11-05 NOTE — Progress Notes (Signed)
Pharmacy Note  Subjective:   Patient presents to clinic today to receive first dose of Enbrel.  Patient running a fever or have signs/symptoms of infection? No  Patient currently on antibiotics for the treatment of infection? No  Patient have any upcoming invasive procedures/surgeries? No  Objective: CMP     Component Value Date/Time   NA 135 07/20/2019 1357   K 4.2 07/20/2019 1357   CL 97 (L) 07/20/2019 1357   CO2 28 07/20/2019 1357   GLUCOSE 99 07/20/2019 1357   BUN 25 07/20/2019 1357   CREATININE 1.06 (H) 07/20/2019 1357   CALCIUM 9.1 07/20/2019 1357   PROT 7.1 07/20/2019 1357   ALBUMIN 4.2 01/12/2014 1805   AST 12 07/20/2019 1357   ALT 13 07/20/2019 1357   ALKPHOS 72 01/12/2014 1805   BILITOT 0.4 07/20/2019 1357   GFRNONAA 52 (L) 07/20/2019 1357   GFRAA 60 07/20/2019 1357    CBC    Component Value Date/Time   WBC 17.0 (H) 07/20/2019 1357   RBC 4.39 07/20/2019 1357   HGB 13.4 07/20/2019 1357   HCT 39.5 07/20/2019 1357   PLT 257 07/20/2019 1357   MCV 90.0 07/20/2019 1357   MCH 30.5 07/20/2019 1357   MCHC 33.9 07/20/2019 1357   RDW 14.3 07/20/2019 1357   LYMPHSABS 1,887 07/20/2019 1357   MONOABS 0.8 10/08/2015 1805   EOSABS 238 07/20/2019 1357   BASOSABS 102 07/20/2019 1357    Baseline Immunosuppressant Therapy Labs TB GOLD Quantiferon TB Gold Latest Ref Rng & Units 06/25/2019  Quantiferon TB Gold Plus NEGATIVE NEGATIVE   Hepatitis Panel Hepatitis Latest Ref Rng & Units 06/25/2019  Hep B Surface Ag NON-REACTI NON-REACTIVE  Hep B IgM NON-REACTI NON-REACTIVE  Hep C Ab NON-REACTI NON-REACTIVE  Hep C Ab NON-REACTI NON-REACTIVE   HIV Lab Results  Component Value Date   HIV NON-REACTIVE 06/25/2019   Immunoglobulins Immunoglobulin Electrophoresis Latest Ref Rng & Units 06/25/2019  IgA  70 - 320 mg/dL 258(N)  IgG 277 - 8,242 mg/dL 3,536  IgM 50 - 144 mg/dL 315   SPEP Serum Protein Electrophoresis Latest Ref Rng & Units 07/20/2019  Total Protein 6.1 -  8.1 g/dL 7.1  Albumin 3.8 - 4.8 g/dL -  Alpha-1 0.2 - 0.3 g/dL -  Alpha-2 0.5 - 0.9 g/dL -  Beta Globulin 0.4 - 0.6 g/dL -  Beta 2 0.2 - 0.5 g/dL -  Gamma Globulin 0.8 - 1.7 g/dL -   Q0GQ No results found for: G6PDH TPMT Lab Results  Component Value Date   TPMT 15 06/25/2019     Chest x-ray: no active cardiopulmonary disease 12/26/2017  Assessment/Plan:  Demonstrated proper injection technique with Enbrel Mini demo pen.  Patient able to demonstrate proper injection technique using the teach back method. Patient self injected in the left anterior thigh with:  Sample Medication: Enbrel Mini 50 mg/ml NDC: 67619-509-32 Lot: [id number] Expiration: 06/2021  Patient tolerated well.  Observed for 30 mins in office for adverse reaction and without issue.   Patient is to return in 1 month for labs.  Standing orders placed.   She was approved for [employer] and has already received her first shipment of Enbrel.  All questions encouraged and answered.  Instructed patient to call with any further questions or concerns.  Verlin Fester, PharmD, [facility] Clinical Pharmacist  11/05/2019 10:42 AM

## 2019-11-18 ENCOUNTER — Telehealth: Payer: Self-pay | Admitting: Rheumatology

## 2019-11-18 NOTE — Telephone Encounter (Signed)
Patient called stating she woke up this morning with a huge circular rash (approximately 3-4 inch diameter) on her upper thigh of her right leg where she took the Enbrel injection last week.  Patient states it is red and slightly raised, but not warm to touch.  Patient states she also noticed a much smaller red circular rash on her right leg where she took her Enbrel injection 2 weeks ago.  Patient is due to take her next injection tomorrow and requesting a return call to let her know what she needs to do.    Per [facility] the message should be forwarded to Dr. Corliss Skains for review.

## 2019-11-18 NOTE — Telephone Encounter (Signed)
Patient advised this is typical injection site reaction.  She should apply ice prior to taking the injection.  She may apply hydrocortisone cream to the site after injecting Enbrel.  Injection site reactions are not uncommon.  If it becomes a persistent issue we may have to switch her to some other medication. Patient verbalized understanding.

## 2019-11-18 NOTE — Telephone Encounter (Signed)
This is typical injection site reaction.  She should apply ice prior to taking the injection.  She may apply hydrocortisone cream to the site after injecting Enbrel.  Injection site reactions are not uncommon.  If it becomes a persistent issue we may have to switch her to some other medication.

## 2019-11-27 ENCOUNTER — Other Ambulatory Visit: Payer: Self-pay

## 2019-11-27 DIAGNOSIS — F17200 Nicotine dependence, unspecified, uncomplicated: Secondary | ICD-10-CM | POA: Insufficient documentation

## 2019-11-27 DIAGNOSIS — E559 Vitamin D deficiency, unspecified: Secondary | ICD-10-CM | POA: Insufficient documentation

## 2019-11-27 DIAGNOSIS — K219 Gastro-esophageal reflux disease without esophagitis: Secondary | ICD-10-CM | POA: Insufficient documentation

## 2019-11-27 DIAGNOSIS — G9001 Carotid sinus syncope: Secondary | ICD-10-CM

## 2019-11-27 DIAGNOSIS — J45909 Unspecified asthma, uncomplicated: Secondary | ICD-10-CM | POA: Insufficient documentation

## 2019-11-27 DIAGNOSIS — E119 Type 2 diabetes mellitus without complications: Secondary | ICD-10-CM | POA: Insufficient documentation

## 2019-11-27 DIAGNOSIS — I8390 Asymptomatic varicose veins of unspecified lower extremity: Secondary | ICD-10-CM | POA: Insufficient documentation

## 2019-11-27 DIAGNOSIS — E039 Hypothyroidism, unspecified: Secondary | ICD-10-CM | POA: Insufficient documentation

## 2019-11-27 DIAGNOSIS — M79605 Pain in left leg: Secondary | ICD-10-CM

## 2019-11-27 DIAGNOSIS — F411 Generalized anxiety disorder: Secondary | ICD-10-CM | POA: Insufficient documentation

## 2019-11-27 DIAGNOSIS — F3289 Other specified depressive episodes: Secondary | ICD-10-CM | POA: Insufficient documentation

## 2019-12-04 ENCOUNTER — Other Ambulatory Visit: Payer: Self-pay

## 2019-12-04 DIAGNOSIS — Z79899 Other long term (current) drug therapy: Secondary | ICD-10-CM

## 2019-12-04 NOTE — Progress Notes (Signed)
Office Visit Note  Patient: [name]             Date of Birth: Jul 06, 1945           MRN: [id number]             PCP: Andi Devon, MD Referring: Andi Devon, MD Visit Date: 12/17/2019 Occupation: @GUAROCC @  Subjective:  Medication monitoring  History of Present Illness: [name] is a 74 y.o. female with history of seronegative rheumatoid arthritis, DDD, and fibromyalgia. Patient is currently on Enbrel 50 mg subcutaneous injections once weekly. She was started on Enbrel in the office on 11/05/19. She has been tolerating Enbrel without any side effects. She has not had any recent infections. She previously was on low-dose methotrexate but discontinued due to recurrent bronchitis. She has noted significant improvement since starting on Enbrel. She denies any joint pain or joint swelling at this time. She states that she has noticed a significant improvement in her right wrist pain and range of motion. She is able to make a complete fist for the first time in a very long time. Patient reports that she has been experiencing increased muscle cramping and spasms recently. She states that at times it feels as though she is not able to control her arm or leg during these episodes. She denies any numbness or tingling. She has not been evaluated by a neurologist but does have an upcoming appointment with her PCP and would like to further discuss at that time. She denies any recent infections. She does not plan on receiving the COVID-19 vaccination at this point. She would like to schedule the annual influenza vaccine.  Activities of Daily Living:  Patient reports morning stiffness for 30 minutes.   Patient Reports nocturnal pain.  Difficulty dressing/grooming: Denies Difficulty climbing stairs: Reports Difficulty getting out of chair: Reports Difficulty using hands for taps, buttons, cutlery, and/or writing: Reports  Review of Systems  Constitutional: Positive for  fatigue.  HENT: Positive for mouth dryness and nose dryness. Negative for mouth sores.   Eyes: Positive for pain, itching and dryness. Negative for redness and visual disturbance.  Respiratory: Negative for shortness of breath and difficulty breathing.   Cardiovascular: Negative for chest pain and palpitations.  Gastrointestinal: Negative for blood in stool, constipation and diarrhea.  Endocrine: Negative for increased urination.  Genitourinary: Negative for difficulty urinating and painful urination.  Musculoskeletal: Positive for arthralgias, joint pain, myalgias, morning stiffness, muscle tenderness and myalgias. Negative for joint swelling.  Skin: Negative for color change, rash and redness.  Allergic/Immunologic: Negative for susceptible to infections.  Neurological: Negative for dizziness, numbness, headaches and weakness.  Hematological: Positive for bruising/bleeding tendency.  Psychiatric/Behavioral: Positive for sleep disturbance.    PMFS History:  Patient Active Problem List   Diagnosis Date Noted  . Asthma 11/27/2019  . 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 Gastroesophageal reflux disease without esophagitis 11/27/2019  . Generalized anxiety disorder 11/27/2019  . Hypothyroidism 11/27/2019  . Smoker 11/27/2019  . Type 2 diabetes mellitus without complication (HCC) 11/27/2019  . Varicose veins of lower extremity 11/27/2019  . Vitamin D deficiency 11/27/2019  . Arthritis of hand 11/05/2019  . Inflammation of joint of both hands 11/05/2019  . Encounter for orthopedic follow-up care 01/09/2019  . Tenosynovitis of fingers 09/22/2018  . Tenosynovitis of right wrist 09/22/2018  . Pain in right hand 09/08/2018  . Nonintractable headache 08/16/2016  . Carotid artery tenderness 10/26/2015  . Leg pain, left 07/27/2014  . Back  pain of thoracolumbar region 08/11/2012  . Renal cyst 08/04/2012  . Cervical spondylosis with radiculopathy 01/24/2012  . Chronic interstitial  cystitis 11/01/2011  . Mixed incontinence 11/01/2011  . EMPHYSEMA 08/05/2008  . LEUKOCYTOSIS 06/25/2008  . COPD 06/25/2008  . GASTROPARESIS 06/25/2008  . DM 06/14/2008  . HYPERLIPIDEMIA 06/14/2008  . Essential hypertension 06/14/2008  . ASTHMATIC BRONCHITIS, ACUTE 06/14/2008  . ALLERGIC RHINITIS 06/14/2008  . LUPUS 06/14/2008    Past Medical History:  Diagnosis Date  . Arthritis    "little bit qwhere" (01/24/2012)  . Asthma   . Chronic back pain    "neck to tailbone" (01/24/2012)  . Chronic bronchitis (HCC)    "last time ~ 1 month ago" (01/24/2012)  . COPD (chronic obstructive pulmonary disease) (HCC)   . Depression    takes Zoloft daily  . Fibromyalgia   . History of bladder infections    sees a urologist--on long term Macrodantin nightly  . History of blood transfusion    as a child  . History of blood transfusion    "when I was a child" (01/24/2012)  . Hyperlipidemia    takes Simvastatin daily  . Hypertension    takes Maxzide daily  . Hypothyroidism    takes Synthroid daily  . Insomnia    takes Ambien nightly  . Lupus (HCC)   . Neck pain    herniated disc and radiculopathy  . Peripheral neuropathy   . Pneumonia 2012   "have had it a couple times" (01/24/2012)  . Smokers' cough (HCC)    has albuterol prn  . Type II diabetes mellitus (HCC)    takes Janumet daily  . Urinary frequency     Family History  Problem Relation Age of Onset  . Heart disease Mother   . Arrhythmia Father   . Heart disease Sister   . Heart attack Brother   . Diabetes Brother    Past Surgical History:  Procedure Laterality Date  . ANTERIOR CERVICAL DECOMP/DISCECTOMY FUSION  2002; 01/24/2012  . ANTERIOR CERVICAL DECOMP/DISCECTOMY FUSION  01/24/2012   Procedure: ANTERIOR CERVICAL DECOMPRESSION/DISCECTOMY FUSION 1 LEVEL;  Surgeon: Cristi Loron, MD;  Location: [facility];  Service: Neurosurgery;  Laterality: N/A;  CERVICAL THREE-FOUR anterior cervical decompression with fusion  interbody prothesis plating and bonegraft  . APPENDECTOMY  1960's  . BACK SURGERY  2001  . BLADDER SURGERY  1977   through abdomen  . BLADDER SUSPENSION  1974  . BUNIONECTOMY  1980's   left  . CARDIAC CATHETERIZATION  2008  . COLONOSCOPY    . ESOPHAGOGASTRODUODENOSCOPY    . LUMBAR FUSION  2009  . RECTOCELE REPAIR  2000  . VAGINAL HYSTERECTOMY  1974   Social History   Social History Narrative  . Not on file   Immunization History  Administered Date(s) Administered  . Influenza Split 01/25/2012  . Influenza, High Dose Seasonal PF 01/01/2017  . Influenza-Unspecified 12/03/2012  . Pneumococcal Conjugate-13 12/17/2017  . Pneumococcal Polysaccharide-23 01/25/2012  . Tdap 04/14/2014     Objective: Vital Signs: BP (!) 145/85 (BP Location: Right Arm, Patient Position: Sitting, Cuff Size: Normal)   Pulse 73   Resp 16   Ht  (1.651 m)   Wt 149 lb (67.6 kg)   BMI 24.79 kg/m    Physical Exam Vitals and nursing note reviewed.  Constitutional:      Appearance: She is well-developed.  HENT:     Head: Normocephalic and atraumatic.  Eyes:     Conjunctiva/sclera: Conjunctivae  normal.  Pulmonary:     Effort: Pulmonary effort is normal.  Abdominal:     Palpations: Abdomen is soft.  Musculoskeletal:     Cervical back: Normal range of motion.  Skin:    General: Skin is warm and dry.     Capillary Refill: Capillary refill takes less than 2 seconds.  Neurological:     Mental Status: She is alert and oriented to person, place, and time.  Psychiatric:        Behavior: Behavior normal.      Musculoskeletal Exam: C-spine limited ROM.  Thoracic kyphosis noted.  No midline spinal tenderness.  No SI joint tenderness.  Shoulder joints and elbow joints good ROM with no discomfort.  Wrist joints good ROM with no tenderness or synovitis.  Complete fist formation bilaterally.  Mild PIP and DIP thickening consistent with osteoarthritis.  Knee joints good ROM with no tenderness or  inflammation.  Ankle joints good ROM with no tenderness or inflammation.   CDAI Exam: CDAI Score: 0.2  Patient Global: 1 mm; Provider Global: 1 mm Swollen: 0 ; Tender: 0  Joint Exam 12/17/2019   No joint exam has been documented for this visit   There is currently no information documented on the homunculus. Go to the Rheumatology activity and complete the homunculus joint exam.  Investigation: No additional findings.  Imaging: VAS [place] ABI WITH/WO TBI  Result Date: 12/14/2019 LOWER EXTREMITY DOPPLER STUDY Indications: Peripheral artery disease.  Performing Technologist: Thereasa Parkin RVT  Examination Guidelines: A complete evaluation includes at minimum, Doppler waveform signals and systolic blood pressure reading at the level of bilateral brachial, anterior tibial, and posterior tibial arteries, when vessel segments are accessible. Bilateral testing is considered an integral part of a complete examination. Photoelectric Plethysmograph (PPG) waveforms and toe systolic pressure readings are included as required and additional duplex testing as needed. Limited examinations for reoccurring indications may be performed as noted.  ABI Findings: +---------+------------------+-----+---------+--------+ Right    Rt Pressure (mmHg)IndexWaveform Comment  +---------+------------------+-----+---------+--------+ Brachial 129                                      +---------+------------------+-----+---------+--------+ PTA      100               0.72 triphasic         +---------+------------------+-----+---------+--------+ DP       123               0.89 triphasic         +---------+------------------+-----+---------+--------+ Great Toe97                0.70                   +---------+------------------+-----+---------+--------+ +---------+------------------+-----+--------+-------+ Left     Lt Pressure (mmHg)IndexWaveformComment  +---------+------------------+-----+--------+-------+ Brachial 138                                    +---------+------------------+-----+--------+-------+ PTA      122               0.88 biphasic        +---------+------------------+-----+--------+-------+ DP       105               0.76 biphasic        +---------+------------------+-----+--------+-------+ Lura Em  0.49                 +---------+------------------+-----+--------+-------+ +-------+-----------+-----------+------------+------------+ ABI/TBIToday's ABIToday's TBIPrevious ABIPrevious TBI +-------+-----------+-----------+------------+------------+ Right  0.89       0.7                                 +-------+-----------+-----------+------------+------------+ Left   0.88       0.49                                +-------+-----------+-----------+------------+------------+  No previous ABI.  Summary: Right: Resting right ankle-brachial index indicates mild right lower extremity arterial disease. The right toe-brachial index is normal. RT great toe pressure = 97 mmHg. Left: Resting left ankle-brachial index indicates mild left lower extremity arterial disease. The left toe-brachial index is abnormal. LT Great toe pressure = 67 mmHg.  *See table(s) above for measurements and observations.  Electronically signed by Coral Else MD on 12/14/2019 at 3:44:04 PM.    Final    VAS US CAROTID  Result Date: 12/14/2019 Carotid Arterial Duplex Study Indications:       Carotid artery disease. Comparison Study:  10/15/16: Bilateral 1-39% ICA stenosis. Performing Technologist: Thereasa Parkin RVT  Examination Guidelines: A complete evaluation includes B-mode imaging, spectral Doppler, color Doppler, and power Doppler as needed of all accessible portions of each vessel. Bilateral testing is considered an integral part of a complete examination. Limited examinations for reoccurring indications may be performed  as noted.  Right Carotid Findings: +----------+--------+--------+--------+------------------+--------+           PSV cm/sEDV cm/sStenosisPlaque DescriptionComments +----------+--------+--------+--------+------------------+--------+ CCA Prox  92      23                                         +----------+--------+--------+--------+------------------+--------+ CCA Mid   83      18                                         +----------+--------+--------+--------+------------------+--------+ CCA Distal69      17              calcific                   +----------+--------+--------+--------+------------------+--------+ ICA Prox  107     29      1-39%   calcific                   +----------+--------+--------+--------+------------------+--------+ ICA Mid   95      27                                         +----------+--------+--------+--------+------------------+--------+ ICA Distal95      29                                         +----------+--------+--------+--------+------------------+--------+ ECA       135     16  heterogenous               +----------+--------+--------+--------+------------------+--------+ +----------+--------+-------+----------------+-------------------+           PSV cm/sEDV cmsDescribe        Arm Pressure (mmHG) +----------+--------+-------+----------------+-------------------+ Subclavian162            Multiphasic, WNL                    +----------+--------+-------+----------------+-------------------+ +---------+--------+--+--------+-+---------+ VertebralPSV cm/s42EDV cm/s9Antegrade +---------+--------+--+--------+-+---------+  Left Carotid Findings: +----------+--------+--------+--------+------------------+--------+           PSV cm/sEDV cm/sStenosisPlaque DescriptionComments +----------+--------+--------+--------+------------------+--------+ CCA Prox  91      26                                          +----------+--------+--------+--------+------------------+--------+ CCA Mid   71      18              heterogenous               +----------+--------+--------+--------+------------------+--------+ CCA Distal58      12              calcific                   +----------+--------+--------+--------+------------------+--------+ ICA Prox  62      13      1-39%   heterogenous               +----------+--------+--------+--------+------------------+--------+ ICA Mid   86      24                                         +----------+--------+--------+--------+------------------+--------+ ICA Distal92      32                                         +----------+--------+--------+--------+------------------+--------+ ECA       69      13              heterogenous               +----------+--------+--------+--------+------------------+--------+ +----------+--------+--------+----------------+-------------------+           PSV cm/sEDV cm/sDescribe        Arm Pressure (mmHG) +----------+--------+--------+----------------+-------------------+ [id number]             Multiphasic, WNL                    +----------+--------+--------+----------------+-------------------+ +---------+--------+--+--------+--+---------+ VertebralPSV cm/s56EDV cm/s16Antegrade +---------+--------+--+--------+--+---------+   Summary: Right Carotid: Velocities in the right ICA are consistent with a 1-39% stenosis. Left Carotid: Velocities in the left ICA are consistent with a 1-39% stenosis. Vertebrals:  Bilateral vertebral arteries demonstrate antegrade flow. Subclavians: Normal flow hemodynamics were seen in bilateral subclavian              arteries. *See table(s) above for measurements and observations.  Electronically signed by Coral ElseVance Brabham MD on 12/14/2019 at 3:44:26 PM.    Final     Recent Labs: Lab Results  Component Value Date   WBC 8.3 12/04/2019   HGB 13.7 12/04/2019   PLT  160 12/04/2019   NA 134 (L) 12/04/2019   K 4.0 12/04/2019  CL 100 12/04/2019   CO2 24 12/04/2019   GLUCOSE 248 (H) 12/04/2019   BUN 16 12/04/2019   CREATININE 1.07 (H) 12/04/2019   BILITOT 0.5 12/04/2019   ALKPHOS 72 01/12/2014   AST 16 12/04/2019   ALT 17 12/04/2019   PROT 6.8 12/04/2019   ALBUMIN 4.2 01/12/2014   CALCIUM 8.9 12/04/2019   GFRAA 59 (L) 12/04/2019   QFTBGOLDPLUS NEGATIVE 06/25/2019    Speciality Comments: No specialty comments available.  Procedures:  No procedures performed Allergies: Erythromycin, Penicillins, Gadolinium derivatives, Iodinated diagnostic agents, Latex, Morphine, Other, Prednisone, Sulfonamide derivatives, Varenicline tartrate, Atorvastatin, and Aspirin   Assessment / Plan:     Visit Diagnoses: Seronegative rheumatoid arthritis (HCC): She has no joint tenderness or synovitis on exam.  She has noticed significant clinical improvement since starting on Enbrel 50 mg subcutaneous injections on 11/05/19.  She is not experiencing any joint pain or inflammation at this time.  Her morning stiffness has been lasting about 30 minutes.  She has had less difficulty with ADLs and is able to make a complete fist bilaterally.  She will continue on Enbrel 50 mg subcutaneous injections once weekly.  She does not need a refill at this time.  She was advised to notify us if she develops increased joint pain or joint swelling.  She will follow-up in the office in 3 months.  High risk medication use - Enbrel 50 mg sq injections once weekly-started on 11/05/19.  D/c MTX 0.4 ml sq wkly inj-low dose- elevated creatinine and recurrent bronchitis.  She noticed increased weight gain while on MTX.   CBC and CMP were drawn on 12/04/2019.  Lab work was reviewed with the patient in detail today.  GFR was 51.  She plans on following up with her urologist for further evaluation and recommendations.  We discussed the importance of avoiding the use of NSAIDs.  She will be due to update lab  work in January and every 3 months to monitor for drug toxicity.  Standing orders for CBC and CMP are in place. TB gold was negative on 06/25/2019 and will continue to be monitored yearly. She has not had any recent infections.  She does not plan to receive the COVID-19 vaccination at this time.  She received the monoclonal antibody infusion after being diagnosed with Covid 19 infection in the past.  She was encouraged to continue to wear a mask and social distance. She is planning on receiving the annual influenza vaccine. She was advised to hold Enbrel if she develops signs or symptoms of an infection and to resume once the infection has completely cleared.  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  She has not had any recent rashes.   DDD (degenerative disc disease), cervical: h/o spinal fusion: She has limited range of motion of the C-spine on exam.  No symptoms of radiculopathy.  She is followed by Dr. Lovell Sheehan.  DDD (degenerative disc disease), lumbar - h/o spinal stenosis: Chronic pain.  No midline spinal tenderness on exam today.   Fibromyalgia: She has not had any recent fibromyalgia flares.  She has intemittent myalgias and muscle tenderness due to fibromyalgia.  No generalized hyperalgesia or positive tender points were noted on exam.  She continues to have chronic fatigue secondary to insomnia.  Muscle cramping: She has been experiencing more frequent episodes of muscle cramping.  She is also noticed some spasticity in her muscles at times.  She  declined a referral to neurology today but was advised to notify us if her symptoms progress or worsen.  She plans on further discussing her symptoms with her PCP at her upcoming physical in a couple weeks.  Oral candidiasis - Resolved  Other medical conditions are listed as follows:   History of diabetic gastroparesis  Essential hypertension  History of type 2 diabetes  mellitus  History of hyperlipidemia  History of asthma  History of hypothyroidism  History of COPD  History of depression  Smoker  Orders: No orders of the defined types were placed in this encounter.  No orders of the defined types were placed in this encounter.    Follow-Up Instructions: Return in about 3 months (around 03/18/2020) for Rheumatoid arthritis, Fibromyalgia, DDD.   Gearldine Bienenstock, PA-C  Note - This record has been created using Dragon software.  Chart creation errors have been sought, but may not always  have been located. Such creation errors do not reflect on  the standard of medical care.

## 2019-12-05 LAB — CBC WITH DIFFERENTIAL/PLATELET
Absolute Monocytes: 349 cells/uL (ref 200–950)
Basophils Absolute: 116 cells/uL (ref 0–200)
Basophils Relative: 1.4 %
Eosinophils Absolute: 307 cells/uL (ref 15–500)
Eosinophils Relative: 3.7 %
HCT: 40.6 % (ref 35.0–45.0)
Hemoglobin: 13.7 g/dL (ref 11.7–15.5)
Lymphs Abs: 1453 cells/uL (ref 850–3900)
MCH: 31.4 pg (ref 27.0–33.0)
MCHC: 33.7 g/dL (ref 32.0–36.0)
MCV: 93.1 fL (ref 80.0–100.0)
MPV: 10.4 fL (ref 7.5–12.5)
Monocytes Relative: 4.2 %
Neutro Abs: 6076 cells/uL (ref 1500–7800)
Neutrophils Relative %: 73.2 %
Platelets: 160 10*3/uL (ref 140–400)
RBC: 4.36 10*6/uL (ref 3.80–5.10)
RDW: 13.3 % (ref 11.0–15.0)
Total Lymphocyte: 17.5 %
WBC: 8.3 10*3/uL (ref 3.8–10.8)

## 2019-12-05 LAB — COMPLETE METABOLIC PANEL WITH GFR
AG Ratio: 1.4 (calc) (ref 1.0–2.5)
ALT: 17 U/L (ref 6–29)
AST: 16 U/L (ref 10–35)
Albumin: 4 g/dL (ref 3.6–5.1)
Alkaline phosphatase (APISO): 73 U/L (ref 37–153)
BUN/Creatinine Ratio: 15 (calc) (ref 6–22)
BUN: 16 mg/dL (ref 7–25)
CO2: 24 mmol/L (ref 20–32)
Calcium: 8.9 mg/dL (ref 8.6–10.4)
Chloride: 100 mmol/L (ref 98–110)
Creat: 1.07 mg/dL — ABNORMAL HIGH (ref 0.60–0.93)
GFR, Est African American: 59 mL/min/{1.73_m2} — ABNORMAL LOW (ref >=60)
GFR, Est Non African American: 51 mL/min/{1.73_m2} — ABNORMAL LOW (ref >=60)
Globulin: 2.8 g/dL (calc) (ref 1.9–3.7)
Glucose, Bld: 248 mg/dL — ABNORMAL HIGH (ref 65–99)
Potassium: 4 mmol/L (ref 3.5–5.3)
Sodium: 134 mmol/L — ABNORMAL LOW (ref 135–146)
Total Bilirubin: 0.5 mg/dL (ref 0.2–1.2)
Total Protein: 6.8 g/dL (ref 6.1–8.1)

## 2019-12-07 NOTE — Progress Notes (Signed)
Glucose is elevated-248.  Creatinine remains elevated and GFR is mildly low-51 but stable.    Please clarify if the patient discontinued MTX or if she has been taking any NSAIDs.   CBC WNL.

## 2019-12-14 ENCOUNTER — Ambulatory Visit (HOSPITAL_COMMUNITY)
Admission: RE | Admit: 2019-12-14 | Discharge: 2019-12-14 | Disposition: A | Discharge status: Home / Self Care | Payer: Medicare Other | Source: Ambulatory Visit | Attending: Surgery | Admitting: Surgery

## 2019-12-14 ENCOUNTER — Encounter: Payer: Self-pay | Admitting: Surgery

## 2019-12-14 ENCOUNTER — Ambulatory Visit: Payer: Medicare Other | Admitting: Surgery

## 2019-12-14 ENCOUNTER — Ambulatory Visit (INDEPENDENT_AMBULATORY_CARE_PROVIDER_SITE_OTHER)
Admission: RE | Admit: 2019-12-14 | Discharge: 2019-12-14 | Disposition: A | Discharge status: Home / Self Care | Payer: Medicare Other | Source: Ambulatory Visit | Attending: Surgery | Admitting: Surgery

## 2019-12-14 ENCOUNTER — Other Ambulatory Visit: Payer: Self-pay

## 2019-12-14 VITALS — BP 122/78 | HR 73 | Temp 98.3°F | Resp 20 | Ht 65.0 in | Wt 149.0 lb

## 2019-12-14 DIAGNOSIS — G9001 Carotid sinus syncope: Secondary | ICD-10-CM | POA: Insufficient documentation

## 2019-12-14 DIAGNOSIS — I700 Atherosclerosis of aorta: Secondary | ICD-10-CM | POA: Diagnosis present

## 2019-12-14 DIAGNOSIS — I6523 Occlusion and stenosis of bilateral carotid arteries: Secondary | ICD-10-CM | POA: Diagnosis not present

## 2019-12-14 NOTE — Progress Notes (Signed)
[facility]ist of [place]  Patient name: [name] MRN: [id number] DOB: [birth year] Sex: female   REASON FOR VISIT:    Follow up  [place]:   [name] is a 74 y.o. female who I initially met in February 2018.  She had approximately a 4 month history of neck pain that initially began on the right side.  It became unbearable.  She described it as an earache.  She is unable to bend over.  It got so bad that she had her husband take her to the emergency department.  She underwent a CT scan that showed a small mass.  She was referred to Dr. Redmond Baseman (ENT).  This was felt to be inflammation and she was giving 2 weeks of steroids.  She returned for repeat CT scan 2 weeks later and there was very little change.  She was placed on Toradol shots daily for 7 days.  On approximately the 11th day of the steroids, she developed shingles.   She denied any history of neck trauma she does have a history of 2 spinal fusions. These were done through the neck.  I referred the patient to rheumatology.  A biopsy had been requested that I was reluctant to proceed with biopsy.  The patient was ultimately sent to [facility].  There, she underwent a PET scan as well as a full body CT scan which she tells me were unremarkable.  She was sent to a neuro ophthalmologist who ruled out Graves' disease.  She also had a temporal artery biopsy which was negative.  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  More recently she developed swelling in her right hand. Dr. Amedeo Plenty operated on her and noted severe inflammation. She was sent to see Dr. Estanislado Pandy who who diagnosed her with rheumatoid and tried her on methotrexate and ultimately and more recently Enbrel. This appears to be helping.   PAST MEDICAL HISTORY:   Past Medical History:  Diagnosis Date   Arthritis    "little bit qwhere" (01/24/2012)   Asthma      Chronic back pain    "neck to tailbone" (01/24/2012)   Chronic bronchitis ([place])    "last time ~ 1 month ago" (01/24/2012)   COPD (chronic obstructive pulmonary disease) ([place])    Depression    takes Zoloft daily   Fibromyalgia    History of bladder infections    sees a urologist--on long term Macrodantin nightly   History of blood transfusion    as a child   History of blood transfusion    "when I was a child" (01/24/2012)   Hyperlipidemia    takes Simvastatin daily   Hypertension    takes Maxzide daily   Hypothyroidism    takes Synthroid daily   Insomnia    takes Ambien nightly   Lupus (HCC)    Neck pain    herniated disc and radiculopathy   Peripheral neuropathy    Pneumonia 2012   "have had it a couple times" (01/24/2012)   Smokers' cough (HCC)    has albuterol prn   Type II diabetes mellitus ([place])    takes Janumet daily   Urinary frequency      FAMILY HISTORY:   Family History  Problem Relation Age of Onset   Heart disease Mother    Arrhythmia Father    Heart disease Sister    Heart attack Brother    Diabetes Brother  SOCIAL HISTORY:   Social History   Tobacco Use   Smoking status: Current Every Day Smoker    Packs/day: 1.00    Years: 60.00    Pack years: 60.00    Types: Cigarettes   Smokeless tobacco: Never Used  Substance Use Topics   Alcohol use: No    Alcohol/week: 0.0 standard drinks     ALLERGIES:   Allergies  Allergen Reactions   Erythromycin Anaphylaxis   Penicillins Anaphylaxis   Gadolinium Derivatives Hives and Rash    Pt broke out in hives all over body.  Has IV steroids and Benadryl in hospital for MRI.   Iodinated Diagnostic Agents Hives    13-hour prep Pt had slight itching of her abd after her injection and had taken her 13 hr prep.  Consult with radiologist before another procedure to see if IV contrast is necessary, per Dr Almyra Free on 01/06/14, JB/   Latex Dermatitis and Other (See  Comments)    blisters   Morphine Nausea And Vomiting   Other Itching and Other (See Comments)    Chlorhexin (CHG); "burning"   Prednisone Other (See Comments)    "shakes me out of my frame" IM steroids aren't as bad as PO   Sulfonamide Derivatives Itching and Rash    All over body    Varenicline Tartrate Nausea And Vomiting        Atorvastatin    Aspirin Nausea Only and Other (See Comments)    Burning in stomach      CURRENT MEDICATIONS:   Current Outpatient Medications  Medication Sig Dispense Refill   albuterol (VENTOLIN HFA) 108 (90 Base) MCG/ACT inhaler      BREZTRI AEROSPHERE 160-9-4.8 MCG/ACT AERO      cyclobenzaprine (FLEXERIL) 10 MG tablet Take 10 mg by mouth 2 (two) times daily as needed.     Etanercept (ENBREL MINI [place]) once a week.      folic acid (FOLVITE) 1 MG tablet Take 2 tablets (2 mg total) by mouth daily. (Patient taking differently: Take 1 mg by mouth daily. ) 180 tablet 2   insulin glargine, 1 Unit Dial, (TOUJEO SOLOSTAR) 300 UNIT/ML Solostar Pen Toujeo SoloStar U-300 Insulin 300 unit/mL (1.5 mL) subcutaneous pen     levothyroxine (SYNTHROID, LEVOTHROID) 50 MCG tablet Take 50 mcg by mouth daily before breakfast.     magic mouthwash SOLN Take 5 mLs by mouth 4 (four) times daily. 240 mL 0   metFORMIN (GLUCOPHAGE) 1000 MG tablet Take 1,000 mg by mouth daily.      metoCLOPramide (REGLAN) 5 MG tablet Take 5 mg by mouth daily.      nitroGLYCERIN (NITROSTAT) 0.4 MG SL tablet Place 1 tablet (0.4 mg total) under the tongue every 5 (five) minutes x 3 doses as needed for chest pain. If no relief after 3rd dose, proceed to the ED for an evaluation 25 tablet 3   ONETOUCH VERIO test strip 1 each 4 (four) times daily.     sertraline (ZOLOFT) 100 MG tablet Take 100 mg by mouth daily.      triamterene-hydrochlorothiazide (MAXZIDE-25) 37.5-25 MG tablet Take 1 tablet by mouth daily.     zolpidem (AMBIEN) 5 MG tablet Take 5 mg by mouth at bedtime. For sleep      No current facility-administered medications for this visit.    REVIEW OF SYSTEMS:   _0  denotes positive finding, _1  denotes negative finding Cardiac  Comments:  Chest pain or chest pressure:    Shortness of breath  upon exertion:    Short of breath when lying flat:    Irregular heart rhythm:        Vascular    Pain in calf, thigh, or hip brought on by ambulation:    Pain in feet at night that wakes you up from your sleep:     Blood clot in your veins:    Leg swelling:         Pulmonary    Oxygen at home:    Productive cough:     Wheezing:         Neurologic    Sudden weakness in arms or legs:     Sudden numbness in arms or legs:     Sudden onset of difficulty speaking or slurred speech:    Temporary loss of vision in one eye:     Problems with dizziness:         Gastrointestinal    Blood in stool:     Vomited blood:         Genitourinary    Burning when urinating:     Blood in urine:        Psychiatric    Major depression:         Hematologic    Bleeding problems:    Problems with blood clotting too easily:        Skin    Rashes or ulcers:        Constitutional    Fever or chills:      PHYSICAL EXAM:   Vitals:   12/14/19 1402 12/14/19 1404  BP: 125/72 122/78  Pulse: 73   Resp: 20   Temp: 98.3 F (36.8 C)   SpO2: 95%   Weight: 149 lb (67.6 kg)   Height: _0  (1.651 m)     GENERAL: The patient is a well-nourished female, in no acute distress. The vital signs are documented above. CARDIAC: There is a regular rate and rhythm.  PULMONARY: Non-labored respirations MUSCULOSKELETAL: There are no major deformities or cyanosis. NEUROLOGIC: No focal weakness or paresthesias are detected. SKIN: There are no ulcers or rashes noted. PSYCHIATRIC: The patient has a normal affect.  STUDIES:   I have reviewed the following:  ABI/TBI Today's ABI Today's TBI Previous ABI Previous TBI   +-------+-----------+-----------+------------+------------+     Right  0.89     0.7                     +-------+-----------+-----------+------------+------------+   Left   0.88     0.49                     +-------+-----------+-----------+------------+------------+  Toe pressure:  97 right Toe pressure:  67 left  Carotid: Right Crotid: Velocities in the left ICA are consistent with a 1-39% arotid: Velocities in the right ICA are consistent with a 1-39%  stenosis.   Left Ca stenosis.   Vertebrals: Bilateral vertebral arteries demonstrate antegrade flow.  Subclavians: Normal flow hemodynamics were seen in bilateral subclavian        arteries.  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 Her work-up is now more consistent with rheumatoid. She is on treatment for this which is beneficial to her. I will continue to follow her carotid disease with ultrasound. This will be repeated in 2 years.    Leia Alf, MD, FACS [facility] Tel (  [phone] Pager [phone]

## 2019-12-17 ENCOUNTER — Other Ambulatory Visit: Payer: Self-pay

## 2019-12-17 ENCOUNTER — Encounter: Payer: Self-pay | Admitting: Physician Assistant

## 2019-12-17 ENCOUNTER — Ambulatory Visit: Payer: Medicare Other | Admitting: Physician Assistant

## 2019-12-17 VITALS — BP 145/85 | HR 73 | Resp 16 | Ht 65.0 in | Wt 149.0 lb

## 2019-12-17 DIAGNOSIS — M797 Fibromyalgia: Secondary | ICD-10-CM

## 2019-12-17 DIAGNOSIS — Z8659 Personal history of other mental and behavioral disorders: Secondary | ICD-10-CM

## 2019-12-17 DIAGNOSIS — M659 Synovitis and tenosynovitis, unspecified: Secondary | ICD-10-CM

## 2019-12-17 DIAGNOSIS — M65949 Unspecified synovitis and tenosynovitis, unspecified hand: Secondary | ICD-10-CM

## 2019-12-17 DIAGNOSIS — Z79899 Other long term (current) drug therapy: Secondary | ICD-10-CM

## 2019-12-17 DIAGNOSIS — L930 Discoid lupus erythematosus: Secondary | ICD-10-CM | POA: Diagnosis not present

## 2019-12-17 DIAGNOSIS — M5136 Other intervertebral disc degeneration, lumbar region: Secondary | ICD-10-CM

## 2019-12-17 DIAGNOSIS — B370 Candidal stomatitis: Secondary | ICD-10-CM

## 2019-12-17 DIAGNOSIS — M0600 Rheumatoid arthritis without rheumatoid factor, unspecified site: Secondary | ICD-10-CM | POA: Diagnosis not present

## 2019-12-17 DIAGNOSIS — M51369 Other intervertebral disc degeneration, lumbar region without mention of lumbar back pain or lower extremity pain: Secondary | ICD-10-CM

## 2019-12-17 DIAGNOSIS — Z8709 Personal history of other diseases of the respiratory system: Secondary | ICD-10-CM

## 2019-12-17 DIAGNOSIS — M65931 Unspecified synovitis and tenosynovitis, right forearm: Secondary | ICD-10-CM

## 2019-12-17 DIAGNOSIS — F17200 Nicotine dependence, unspecified, uncomplicated: Secondary | ICD-10-CM

## 2019-12-17 DIAGNOSIS — M5030 Other cervical disc degeneration, unspecified cervical region: Secondary | ICD-10-CM

## 2019-12-17 DIAGNOSIS — I10 Essential (primary) hypertension: Secondary | ICD-10-CM

## 2019-12-17 DIAGNOSIS — Z8639 Personal history of other endocrine, nutritional and metabolic disease: Secondary | ICD-10-CM

## 2019-12-17 DIAGNOSIS — R252 Cramp and spasm: Secondary | ICD-10-CM

## 2019-12-17 NOTE — Patient Instructions (Signed)

## 2020-01-19 ENCOUNTER — Other Ambulatory Visit: Payer: Self-pay | Admitting: Radiology

## 2020-01-19 DIAGNOSIS — M0600 Rheumatoid arthritis without rheumatoid factor, unspecified site: Secondary | ICD-10-CM

## 2020-01-19 MED ORDER — ENBREL MINI 50 MG/ML ~~LOC~~ SOCT: 50.0000 mg | SUBCUTANEOUS | 2 refills | Status: DC

## 2020-01-19 NOTE — Telephone Encounter (Signed)
Last Visit: 12/17/2019 Next Visit: 03/18/2020 Labs: 12/04/2019 Glucose is elevated-248. Creatinine remains elevated and GFR is mildly low-51 but stable. CBC WNL.  TB Gold: 06/25/2019 Negative  Current Dose per office note 12/17/2019: Enbrel 50 mg sq injections once weekly  DX: Seronegative rheumatoid arthritis  Okay to refill Enbrel?

## 2020-01-20 ENCOUNTER — Telehealth: Payer: Self-pay | Admitting: Rheumatology

## 2020-01-20 DIAGNOSIS — M0600 Rheumatoid arthritis without rheumatoid factor, unspecified site: Secondary | ICD-10-CM

## 2020-01-20 MED ORDER — ENBREL MINI 50 MG/ML ~~LOC~~ SOCT: 50.0000 mg | SUBCUTANEOUS | 2 refills | Status: DC

## 2020-01-20 NOTE — Telephone Encounter (Signed)
Prescription resent to [employer].

## 2020-01-20 NOTE — Telephone Encounter (Signed)
Patient calling in reference to refill on Enbrel. Refill was sent to local pharmacy, and it should have been sent to [employer]. Please send 1 month supply to [employer].

## 2020-01-25 ENCOUNTER — Telehealth: Payer: Self-pay | Admitting: Pharmacist

## 2020-01-25 NOTE — Telephone Encounter (Signed)
Submitted Patient Assistance Application to [employer] for [employer] renewal along with provider and patient portion. Will update patient when we receive a response.  Chesley Mires, PharmD, MPH Clinical Pharmacist (Rheumatology and Pulmonology)

## 2020-02-12 ENCOUNTER — Telehealth: Payer: Self-pay

## 2020-02-12 NOTE — Telephone Encounter (Signed)
Patient called checking the status of her prescription of Enbrel.  Patient states it was sent to her local pharmacy last month by mistake and needed to be resent to [employer].  Patient states she still hasn't received the medication.  Patient states she has missed 3 weeks of taking injections and requesting a return call.

## 2020-02-12 NOTE — Telephone Encounter (Signed)
Spoke with patient and advised I called and gave a verbal prescription for the Enbrel refill to [employer]. They advised they would mark it urgent. Patient states she is in quite a bit of discomfort. Patient declined having a message sent to the doctor for anything additional to help with the discomfort. Patient states she has been under an increased amount of stress due to her husband and his cancer and doctor's appointment. Patient has been taking him to 2-3 doctor's appointment every week.

## 2020-02-22 ENCOUNTER — Telehealth: Payer: Self-pay | Admitting: Rheumatology

## 2020-02-22 NOTE — Telephone Encounter (Signed)
Submitted a Prior Authorization request to [facility] for ENBREL via Cover My Meds. Will update once we receive a response.   (Key: [id number]) - [id number]  Will fax once approved.

## 2020-02-22 NOTE — Telephone Encounter (Signed)
[employer] needs updated PA for patients Enbrel mini 50 mg 4 pack. Fax # [phone].

## 2020-02-23 NOTE — Telephone Encounter (Signed)
Received notification from [facility] regarding a prior authorization for ENBREL. Authorization has been APPROVED from 03/05/20 to 03/04/21.   Authorization # [id number]   Faxed Pa to [employer]. Nothing further needed.

## 2020-03-06 NOTE — Progress Notes (Signed)
Office Visit Note  Patient: [name]             Date of Birth: 06/07/1945           MRN: [id number]             PCP: Andi Devon, MD Referring: Andi Devon, MD Visit Date: 03/18/2020 Occupation: @GUAROCC @  Subjective:  Medication monitoring.   History of Present Illness: is a 75 y.o. female history of seronegative rheumatoid arthritis, degenerative disc disease and fibromyalgia.  She states she has been under a lot of stress.  Her husband has been diagnosed with melanoma.  She has been doing dressing every day.  She has been noticing some swelling in her left foot and ankle at night only.  She denies any pain and discomfort in her hands.  She has been tolerating Enbrel well.  She has been experiencing some muscle spasms in her extremities.  She denies any neck or lower back discomfort.  Activities of Daily Living:  Patient reports morning stiffness for 45-60 minutes.    Patient Reports nocturnal pain.  Difficulty dressing/grooming: Reports Difficulty climbing stairs: Reports Difficulty getting out of chair: Reports Difficulty using hands for taps, buttons, cutlery, and/or writing: Reports  Review of Systems  Constitutional: Positive for fatigue.  HENT: Positive for mouth dryness. Negative for mouth sores and nose dryness.   Eyes: Negative for pain, itching and dryness.  Respiratory: Negative for shortness of breath and difficulty breathing.   Cardiovascular: Negative for chest pain and palpitations.  Gastrointestinal: Negative for blood in stool, constipation and diarrhea.  Endocrine: Negative for increased urination.  Genitourinary: Negative for difficulty urinating.  Musculoskeletal: Positive for arthralgias, joint pain, joint swelling, myalgias, muscle weakness, morning stiffness, muscle tenderness and myalgias.  Skin: Negative for color change, rash and redness.  Allergic/Immunologic: Positive for susceptible to infections.   Neurological: Negative for dizziness, numbness, headaches, memory loss and weakness.  Hematological: Positive for bruising/bleeding tendency.  Psychiatric/Behavioral: Negative for confusion.    PMFS History:  Patient Active Problem List   Diagnosis Date Noted  . Asthma 11/27/2019  . 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 Gastroesophageal reflux disease without esophagitis 11/27/2019  . Generalized anxiety disorder 11/27/2019  . Hypothyroidism 11/27/2019  . Smoker 11/27/2019  . Type 2 diabetes mellitus without complication (HCC) 11/27/2019  . Varicose veins of lower extremity 11/27/2019  . Vitamin D deficiency 11/27/2019  . Arthritis of hand 11/05/2019  . Inflammation of joint of both hands 11/05/2019  . Encounter for orthopedic follow-up care 01/09/2019  . Tenosynovitis of fingers 09/22/2018  . Tenosynovitis of right wrist 09/22/2018  . Pain in right hand 09/08/2018  . Nonintractable headache 08/16/2016  . Carotid artery tenderness 10/26/2015  . Leg pain, left 07/27/2014  . Back pain of thoracolumbar region 08/11/2012  . Renal cyst 08/04/2012  . Cervical spondylosis with radiculopathy 01/24/2012  . Chronic interstitial cystitis 11/01/2011  . Mixed incontinence 11/01/2011  . EMPHYSEMA 08/05/2008  . LEUKOCYTOSIS 06/25/2008  . COPD 06/25/2008  . GASTROPARESIS 06/25/2008  . DM 06/14/2008  . HYPERLIPIDEMIA 06/14/2008  . Essential hypertension 06/14/2008  . ASTHMATIC BRONCHITIS, ACUTE 06/14/2008  . ALLERGIC RHINITIS 06/14/2008  . LUPUS 06/14/2008    Past Medical History:  Diagnosis Date  . Arthritis    "little bit qwhere" (01/24/2012)  . Asthma   . Chronic back pain    "neck to tailbone" (01/24/2012)  . Chronic bronchitis (HCC)    "last time ~ 1 month ago" (01/24/2012)  .  COPD (chronic obstructive pulmonary disease) (HCC)   . Depression    takes Zoloft daily  . Fibromyalgia   . History of bladder infections    sees a urologist--on long term Macrodantin nightly   . History of blood transfusion    as a child  . History of blood transfusion    "when I was a child" (01/24/2012)  . Hyperlipidemia    takes Simvastatin daily  . Hypertension    takes Maxzide daily  . Hypothyroidism    takes Synthroid daily  . Insomnia    takes Ambien nightly  . Lupus (HCC)   . Neck pain    herniated disc and radiculopathy  . Peripheral neuropathy   . Pneumonia 2012   "have had it a couple times" (01/24/2012)  . Smokers' cough (HCC)    has albuterol prn  . Type II diabetes mellitus (HCC)    takes Janumet daily  . Urinary frequency     Family History  Problem Relation Age of Onset  . Heart disease Mother   . Arrhythmia Father   . Heart disease Sister   . Heart attack Brother   . Diabetes Brother    Past Surgical History:  Procedure Laterality Date  . ANTERIOR CERVICAL DECOMP/DISCECTOMY FUSION  2002; 01/24/2012  . ANTERIOR CERVICAL DECOMP/DISCECTOMY FUSION  01/24/2012   Procedure: ANTERIOR CERVICAL DECOMPRESSION/DISCECTOMY FUSION 1 LEVEL;  Surgeon: Cristi Loron, MD;  Location: [facility];  Service: Neurosurgery;  Laterality: N/A;  CERVICAL THREE-FOUR anterior cervical decompression with fusion interbody prothesis plating and bonegraft  . APPENDECTOMY  1960's  . BACK SURGERY  2001  . BLADDER SURGERY  1977   through abdomen  . BLADDER SUSPENSION  1974  . BUNIONECTOMY  1980's   left  . CARDIAC CATHETERIZATION  2008  . COLONOSCOPY    . ESOPHAGOGASTRODUODENOSCOPY    . LUMBAR FUSION  2009  . RECTOCELE REPAIR  2000  . VAGINAL HYSTERECTOMY  1974   Social History   Social History Narrative  . Not on file   Immunization History  Administered Date(s) Administered  . Influenza Split 01/25/2012  . Influenza, High Dose Seasonal PF 01/01/2017  . Influenza-Unspecified 12/03/2012  . Pneumococcal Conjugate-13 12/17/2017  . Pneumococcal Polysaccharide-23 01/25/2012  . Tdap 04/14/2014     Objective: Vital Signs: BP 123/67 (BP Location: Left Arm,  Patient Position: Sitting, Cuff Size: Normal)   Pulse 74   Resp 15   Ht 5\' 5"  (1.651 m)   Wt 149 lb 3.2 oz (67.7 kg)   BMI 24.83 kg/m    Physical Exam Vitals and nursing note reviewed.  Constitutional:      Appearance: She is well-developed and well-nourished.  HENT:     Head: Normocephalic and atraumatic.  Eyes:     Extraocular Movements: EOM normal.     Conjunctiva/sclera: Conjunctivae normal.  Cardiovascular:     Rate and Rhythm: Normal rate and regular rhythm.     Pulses: Intact distal pulses.     Heart sounds: Normal heart sounds.  Pulmonary:     Effort: Pulmonary effort is normal.     Breath sounds: Normal breath sounds.  Abdominal:     General: Bowel sounds are normal.     Palpations: Abdomen is soft.  Musculoskeletal:     Cervical back: Normal range of motion.  Lymphadenopathy:     Cervical: No cervical adenopathy.  Skin:    General: Skin is warm and dry.     Capillary Refill: Capillary refill takes  less than 2 seconds.  Neurological:     Mental Status: She is alert and oriented to person, place, and time.  Psychiatric:        Mood and Affect: Mood and affect normal.        Behavior: Behavior normal.      Musculoskeletal Exam: She has good range of motion of her cervical spine.  She has painful range of motion of her lumbar spine.  Shoulder joints, elbow joints, wrist joints, MCPs PIPs and DIPs with good range of motion with no synovitis.  Hip joints, knee joints, ankles, MTPs with good range of motion with no synovitis.  CDAI Exam: CDAI Score: 0.6  Patient Global: 3 mm; Provider Global: 3 mm Swollen: 0 ; Tender: 0  Joint Exam 03/18/2020   No joint exam has been documented for this visit   There is currently no information documented on the homunculus. Go to the Rheumatology activity and complete the homunculus joint exam.  Investigation: No additional findings.  Imaging: No results found.  Recent Labs: Lab Results  Component Value Date   WBC 8.3  12/04/2019   HGB 13.7 12/04/2019   PLT 160 12/04/2019   NA 134 (L) 12/04/2019   K 4.0 12/04/2019   CL 100 12/04/2019   CO2 24 12/04/2019   GLUCOSE 248 (H) 12/04/2019   BUN 16 12/04/2019   CREATININE 1.07 (H) 12/04/2019   BILITOT 0.5 12/04/2019   ALKPHOS 72 01/12/2014   AST 16 12/04/2019   ALT 17 12/04/2019   PROT 6.8 12/04/2019   ALBUMIN 4.2 01/12/2014   CALCIUM 8.9 12/04/2019   GFRAA 59 (L) 12/04/2019   QFTBGOLDPLUS NEGATIVE 06/25/2019    Speciality Comments: No specialty comments available.  Procedures:  No procedures performed Allergies: Erythromycin, Penicillins, Gadolinium derivatives, Iodinated diagnostic agents, Latex, Morphine, Other, Prednisone, Sulfonamide derivatives, Varenicline tartrate, Atorvastatin, and Aspirin   Assessment / Plan:     Visit Diagnoses: Seronegative rheumatoid arthritis (HCC) -she is clinically doing very well with no synovitis on examination today.  She gives history of some swelling in her left leg at night.  I believe its pedal edema.  She had no pedal edema on my examination there are no synovitis on examination today.  Plan: Sedimentation rate  High risk medication use - Enbrel 50 mg sq injections once weekly-started on 11/05/19.  D/c MTX 0.4 ml sq wkly inj-low dose- elevated creatinine and recurrent bronchitis - Plan: CBC with Differential/Platelet, COMPLETE METABOLIC PANEL WITH GFR, today and then every 3 months.  QuantiFERON-TB Gold Plus with her next labs.  Patient did not receive vaccination against COVID-19.  She does not want to be vaccinated.  Use of mask, social distancing and hand hygiene was discussed.  Tenosynovitis of fingers-resolved.  Tenosynovitis of right wrist-resolved.  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  DDD (degenerative disc disease), cervical-she had good range of motion.  DDD (degenerative disc disease), lumbar-she had discomfort with range of motion.  Fibromyalgia-she denies any generalized pain.  Muscle  cramping-she continues to have muscle cramps.  Oral candidiasis  History of diabetic gastroparesis  Essential hypertension  History of type 2 diabetes mellitus  History of hyperlipidemia  History of asthma  History of hypothyroidism  History of COPD  History of depression-she has been under a lot of stress as her husband has been diagnosed with melanoma.  He had surgery.  She has been doing wound dressing on a regular basis.  Smoker  Orders: Orders Placed This Encounter  Procedures  .  CBC with Differential/Platelet  . COMPLETE METABOLIC PANEL WITH GFR  . QuantiFERON-TB Gold Plus  . Sedimentation rate   No orders of the defined types were placed in this encounter.    Follow-Up Instructions: Return in about 3 months (around 06/16/2020) for Rheumatoid arthritis.   Bo Merino, MD  Note - This record has been created using Editor, commissioning.  Chart creation errors have been sought, but may not always  have been located. Such creation errors do not reflect on  the standard of medical care.

## 2020-03-18 ENCOUNTER — Ambulatory Visit: Payer: Medicare Other | Admitting: Rheumatology

## 2020-03-18 ENCOUNTER — Other Ambulatory Visit: Payer: Self-pay

## 2020-03-18 ENCOUNTER — Encounter: Payer: Self-pay | Admitting: Rheumatology

## 2020-03-18 VITALS — BP 123/67 | HR 74 | Resp 15 | Ht 65.0 in | Wt 149.2 lb

## 2020-03-18 DIAGNOSIS — Z79899 Other long term (current) drug therapy: Secondary | ICD-10-CM

## 2020-03-18 DIAGNOSIS — Z8639 Personal history of other endocrine, nutritional and metabolic disease: Secondary | ICD-10-CM

## 2020-03-18 DIAGNOSIS — L930 Discoid lupus erythematosus: Secondary | ICD-10-CM | POA: Diagnosis not present

## 2020-03-18 DIAGNOSIS — M0600 Rheumatoid arthritis without rheumatoid factor, unspecified site: Secondary | ICD-10-CM

## 2020-03-18 DIAGNOSIS — M5030 Other cervical disc degeneration, unspecified cervical region: Secondary | ICD-10-CM

## 2020-03-18 DIAGNOSIS — M5136 Other intervertebral disc degeneration, lumbar region: Secondary | ICD-10-CM

## 2020-03-18 DIAGNOSIS — Z8659 Personal history of other mental and behavioral disorders: Secondary | ICD-10-CM

## 2020-03-18 DIAGNOSIS — M659 Synovitis and tenosynovitis, unspecified: Secondary | ICD-10-CM

## 2020-03-18 DIAGNOSIS — M65931 Unspecified synovitis and tenosynovitis, right forearm: Secondary | ICD-10-CM

## 2020-03-18 DIAGNOSIS — B370 Candidal stomatitis: Secondary | ICD-10-CM

## 2020-03-18 DIAGNOSIS — Z8709 Personal history of other diseases of the respiratory system: Secondary | ICD-10-CM

## 2020-03-18 DIAGNOSIS — R252 Cramp and spasm: Secondary | ICD-10-CM

## 2020-03-18 DIAGNOSIS — M797 Fibromyalgia: Secondary | ICD-10-CM

## 2020-03-18 DIAGNOSIS — F17200 Nicotine dependence, unspecified, uncomplicated: Secondary | ICD-10-CM

## 2020-03-18 DIAGNOSIS — M51369 Other intervertebral disc degeneration, lumbar region without mention of lumbar back pain or lower extremity pain: Secondary | ICD-10-CM

## 2020-03-18 DIAGNOSIS — M65949 Unspecified synovitis and tenosynovitis, unspecified hand: Secondary | ICD-10-CM

## 2020-03-18 DIAGNOSIS — I10 Essential (primary) hypertension: Secondary | ICD-10-CM

## 2020-03-18 NOTE — Patient Instructions (Signed)
Standing Labs We placed an order today for your standing lab work.   Please have your standing labs drawn in April and every 3 months   If possible, please have your labs drawn 2 weeks prior to your appointment so that the provider can discuss your results at your appointment.  We have open lab daily Monday through Thursday from 8:30-12:30 PM and 1:30-4:30 PM and Friday from 8:30-12:30 PM and 1:30-4:00 PM at the office of Dr. Jora Galluzzo, [facility].   Please be advised, patients with office appointments requiring lab work will take precedents over walk-in lab work.  If possible, please come for your lab work on Monday and Friday afternoons, as you may experience shorter wait times. The office is located at [address], [address], [place], [place] [address] No appointment is necessary.   Labs are drawn by [facility]. Please bring your co-pay at the time of your lab draw.  You may receive a bill from [facility] for your lab work.  If you wish to have your labs drawn at another location, please call the office 24 hours in advance to send orders.  If you have any questions regarding directions or hours of operation,  please call [phone].   As a reminder, please drink plenty of water prior to coming for your lab work. Thanks!   

## 2020-03-24 LAB — COMPLETE METABOLIC PANEL WITH GFR
AG Ratio: 1.2 (calc) (ref 1.0–2.5)
ALT: 17 U/L (ref 6–29)
AST: 19 U/L (ref 10–35)
Albumin: 4.2 g/dL (ref 3.6–5.1)
Alkaline phosphatase (APISO): 83 U/L (ref 37–153)
BUN/Creatinine Ratio: 16 (calc) (ref 6–22)
BUN: 16 mg/dL (ref 7–25)
CO2: 27 mmol/L (ref 20–32)
Calcium: 9.4 mg/dL (ref 8.6–10.4)
Chloride: 99 mmol/L (ref 98–110)
Creat: 0.99 mg/dL — ABNORMAL HIGH (ref 0.60–0.93)
GFR, Est African American: 65 mL/min/{1.73_m2} (ref >=60)
GFR, Est Non African American: 56 mL/min/{1.73_m2} — ABNORMAL LOW (ref >=60)
Globulin: 3.4 g/dL (calc) (ref 1.9–3.7)
Glucose, Bld: 85 mg/dL (ref 65–139)
Potassium: 4.3 mmol/L (ref 3.5–5.3)
Sodium: 134 mmol/L — ABNORMAL LOW (ref 135–146)
Total Bilirubin: 0.5 mg/dL (ref 0.2–1.2)
Total Protein: 7.6 g/dL (ref 6.1–8.1)

## 2020-03-24 LAB — CBC WITH DIFFERENTIAL/PLATELET
Absolute Monocytes: 708 cells/uL (ref 200–950)
Basophils Absolute: 171 cells/uL (ref 0–200)
Basophils Relative: 1.4 %
Eosinophils Absolute: 366 cells/uL (ref 15–500)
Eosinophils Relative: 3 %
HCT: 39.8 % (ref 35.0–45.0)
Hemoglobin: 13.8 g/dL (ref 11.7–15.5)
Lymphs Abs: 2233 cells/uL (ref 850–3900)
MCH: 30.7 pg (ref 27.0–33.0)
MCHC: 34.7 g/dL (ref 32.0–36.0)
MCV: 88.4 fL (ref 80.0–100.0)
MPV: 9.8 fL (ref 7.5–12.5)
Monocytes Relative: 5.8 %
Neutro Abs: 8723 cells/uL — ABNORMAL HIGH (ref 1500–7800)
Neutrophils Relative %: 71.5 %
Platelets: 261 10*3/uL (ref 140–400)
RBC: 4.5 10*6/uL (ref 3.80–5.10)
RDW: 13.3 % (ref 11.0–15.0)
Total Lymphocyte: 18.3 %
WBC: 12.2 10*3/uL — ABNORMAL HIGH (ref 3.8–10.8)

## 2020-03-24 LAB — SEDIMENTATION RATE: Sed Rate: 74 mm/h — ABNORMAL HIGH (ref 0–30)

## 2020-03-24 NOTE — Progress Notes (Signed)
White cell count is elevated.  Creatinine is better.  Sed rate is a still elevated.  Patient was asymptomatic at the last visit.  Please ask if she has any symptoms of infection.

## 2020-03-26 ENCOUNTER — Other Ambulatory Visit: Payer: Self-pay | Admitting: Internal Medicine

## 2020-03-26 DIAGNOSIS — E2839 Other primary ovarian failure: Secondary | ICD-10-CM

## 2020-04-18 NOTE — Telephone Encounter (Signed)
Received a fax from  [employer] regarding an approval for ENBREL patient assistance from 04/16/20 to 03/04/21.   Phone number: [phone]    

## 2020-05-09 ENCOUNTER — Other Ambulatory Visit: Payer: Self-pay | Admitting: Rheumatology

## 2020-05-09 NOTE — Telephone Encounter (Signed)
Last Visit: 03/18/2020 Next Visit: 06/16/2020  Okay to refill folic acid?

## 2020-05-10 ENCOUNTER — Other Ambulatory Visit: Payer: Self-pay

## 2020-05-10 NOTE — Telephone Encounter (Signed)
Please clarify if the patient is still on MTX.  I do not see it on her medication list. If she has discontinued MTX she can discontinue folic acid.

## 2020-05-10 NOTE — Telephone Encounter (Signed)
Per office note on 03/18/2020, patient has discontinued methotrexate. I attempted to contact patient and left message on machine to advise patient she can discontinue folic acid since she is no longer taking methotrexate.

## 2020-06-02 NOTE — Progress Notes (Signed)
Office Visit Note  Patient: [name] CroftVirginia G [name]             Date of Birth: 07/08/1945           MRN: [id number]             PCP: Andi DevonShelton, Kimberly, MD Referring: Andi DevonShelton, Kimberly, MD Visit Date: 06/16/2020 Occupation: @GUAROCC @  Subjective  Muscle spasms   History of Present Illness: [name] CroftVirginia G Petsch is a 75 y.o. female with history of seronegative rheumatoid arthritis, discoid lupus, fibromyalgia, and DDD. She is on enbrel 50 mg sq injections once weekly. Her most recent enbrel injection was this morning, and she has noticed itching and redness at the injection site. She denies any recent rheumatoid arthritis flares.  She has found enbrel to be effective at managing her RA.  She denies any joint swelling at this time. She has been experiencing intermittent muscle spasms in her arms and legs over the past several months. The spasms in her legs usually only occur at night but can be severe at times.  She has been taking over-the-counter magnesium supplement and Flexeril 10 mg at bedtime but has not noticed any improvement in her symptoms.  She is also increased the dose of vitamin D to 5000 units daily.  She denies any muscle tenderness or myalgias. She denies any recent infections.     Activities of Daily Living:  Patient reports morning stiffness for 30  minutes.   Patient Reports nocturnal pain.  Difficulty dressing/grooming: Reports Difficulty climbing stairs: Reports Difficulty getting out of chair: Reports Difficulty using hands for taps, buttons, cutlery, and/or writing: Reports  Review of Systems  Constitutional: Positive for fatigue.  HENT: Positive for mouth sores and mouth dryness. Negative for nose dryness.   Eyes: Positive for dryness. Negative for pain and itching.  Respiratory: Negative for shortness of breath and difficulty breathing.   Cardiovascular: Negative for chest pain and palpitations.  Gastrointestinal: Negative for blood in stool, constipation and  diarrhea.  Endocrine: Negative for increased urination.  Genitourinary: Negative for difficulty urinating.  Musculoskeletal: Positive for arthralgias, joint pain, myalgias, morning stiffness, muscle tenderness and myalgias. Negative for joint swelling.  Skin: Positive for rash. Negative for color change.  Allergic/Immunologic: Positive for susceptible to infections.  Neurological: Positive for headaches. Negative for dizziness, numbness, memory loss and weakness.  Hematological: Positive for bruising/bleeding tendency.  Psychiatric/Behavioral: Negative for confusion.    PMFS History:  Patient Active Problem List   Diagnosis Date Noted  . Asthma 11/27/2019  . 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 Gastroesophageal reflux disease without esophagitis 11/27/2019  . Generalized anxiety disorder 11/27/2019  . Hypothyroidism 11/27/2019  . Smoker 11/27/2019  . Type 2 diabetes mellitus without complication (HCC) 11/27/2019  . Varicose veins of lower extremity 11/27/2019  . Vitamin D deficiency 11/27/2019  . Arthritis of hand 11/05/2019  . Inflammation of joint of both hands 11/05/2019  . Encounter for orthopedic follow-up care 01/09/2019  . Tenosynovitis of fingers 09/22/2018  . Tenosynovitis of right wrist 09/22/2018  . Pain in right hand 09/08/2018  . Nonintractable headache 08/16/2016  . Carotid artery tenderness 10/26/2015  . Leg pain, left 07/27/2014  . Back pain of thoracolumbar region 08/11/2012  . Renal cyst 08/04/2012  . Cervical spondylosis with radiculopathy 01/24/2012  . Chronic interstitial cystitis 11/01/2011  . Mixed incontinence 11/01/2011  . EMPHYSEMA 08/05/2008  . LEUKOCYTOSIS 06/25/2008  . COPD 06/25/2008  . GASTROPARESIS 06/25/2008  . DM 06/14/2008  . HYPERLIPIDEMIA 06/14/2008  .  Essential hypertension 06/14/2008  . ASTHMATIC BRONCHITIS, ACUTE 06/14/2008  . ALLERGIC RHINITIS 06/14/2008  . LUPUS 06/14/2008    Past Medical History:  Diagnosis Date  .  Arthritis    "little bit qwhere" (01/24/2012)  . Asthma   . Chronic back pain    "neck to tailbone" (01/24/2012)  . Chronic bronchitis (HCC)    "last time ~ 1 month ago" (01/24/2012)  . COPD (chronic obstructive pulmonary disease) (HCC)   . Depression    takes Zoloft daily  . Fibromyalgia   . History of bladder infections    sees a urologist--on long term Macrodantin nightly  . History of blood transfusion    as a child  . History of blood transfusion    "when I was a child" (01/24/2012)  . Hyperlipidemia    takes Simvastatin daily  . Hypertension    takes Maxzide daily  . Hypothyroidism    takes Synthroid daily  . Insomnia    takes Ambien nightly  . Lupus (HCC)   . Neck pain    herniated disc and radiculopathy  . Peripheral neuropathy   . Pneumonia 2012   "have had it a couple times" (01/24/2012)  . Smokers' cough (HCC)    has albuterol prn  . Type II diabetes mellitus (HCC)    takes Janumet daily  . Urinary frequency     Family History  Problem Relation Age of Onset  . Heart disease Mother   . Arrhythmia Father   . Heart disease Sister   . Heart attack Brother   . Diabetes Brother    Past Surgical History:  Procedure Laterality Date  . ANTERIOR CERVICAL DECOMP/DISCECTOMY FUSION  2002; 01/24/2012  . ANTERIOR CERVICAL DECOMP/DISCECTOMY FUSION  01/24/2012   Procedure: ANTERIOR CERVICAL DECOMPRESSION/DISCECTOMY FUSION 1 LEVEL;  Surgeon: Cristi Loron, MD;  Location: [facility];  Service: Neurosurgery;  Laterality: N/A;  CERVICAL THREE-FOUR anterior cervical decompression with fusion interbody prothesis plating and bonegraft  . APPENDECTOMY  1960's  . BACK SURGERY  2001  . BLADDER SURGERY  1977   through abdomen  . BLADDER SUSPENSION  1974  . BUNIONECTOMY  1980's   left  . CARDIAC CATHETERIZATION  2008  . COLONOSCOPY    . ESOPHAGOGASTRODUODENOSCOPY    . LUMBAR FUSION  2009  . RECTOCELE REPAIR  2000  . VAGINAL HYSTERECTOMY  1974   Social History    Social History Narrative  . Not on file   Immunization History  Administered Date(s) Administered  . Influenza Split 01/25/2012  . Influenza, High Dose Seasonal PF 01/01/2017  . Influenza-Unspecified 12/03/2012  . Pneumococcal Conjugate-13 12/17/2017  . Pneumococcal Polysaccharide-23 01/25/2012  . Tdap 04/14/2014     Objective: Vital Signs: BP 131/72 (BP Location: Left Arm, Patient Position: Sitting, Cuff Size: Normal)   Pulse 73   Resp 16   Ht 5\' 5"  (1.651 m)   Wt 145 lb 9.6 oz (66 kg)   BMI 24.23 kg/m    Physical Exam Vitals and nursing note reviewed.  Constitutional:      Appearance: She is well-developed.  HENT:     Head: Normocephalic and atraumatic.  Eyes:     Conjunctiva/sclera: Conjunctivae normal.  Pulmonary:     Effort: Pulmonary effort is normal.  Abdominal:     Palpations: Abdomen is soft.  Musculoskeletal:     Cervical back: Normal range of motion.  Skin:    General: Skin is warm and dry.     Capillary Refill: Capillary refill  takes less than 2 seconds.  Neurological:     Mental Status: She is alert and oriented to person, place, and time.  Psychiatric:        Behavior: Behavior normal.      Musculoskeletal Exam: C-spine has good range of motion with no discomfort.  Painful range of motion of lumbar spine.  Shoulder joints, elbow joints, wrist joints, MCPs, PIPs, DIPs have good range of motion with no synovitis.  She is able to make a complete fist bilaterally.  PIP and DIP thickening consistent with osteoarthritis of both hands noted.  Hip joints have good range of motion with no discomfort.  Knee joints have good range of motion with no warmth or effusion.  Ankle joints have good range of motion with no tenderness or inflammation.  No tenderness over MTP joints.  CDAI Exam: CDAI Score: 0.8  Patient Global: 4 mm; Provider Global: 4 mm Swollen: 0 ; Tender: 0  Joint Exam 06/16/2020   No joint exam has been documented for this visit   There is  currently no information documented on the homunculus. Go to the Rheumatology activity and complete the homunculus joint exam.  Investigation: No additional findings.  Imaging: No results found.  Recent Labs: Lab Results  Component Value Date   WBC 12.2 (H) 03/23/2020   HGB 13.8 03/23/2020   PLT 261 03/23/2020   NA 134 (L) 03/23/2020   K 4.3 03/23/2020   CL 99 03/23/2020   CO2 27 03/23/2020   GLUCOSE 85 03/23/2020   BUN 16 03/23/2020   CREATININE 0.99 (H) 03/23/2020   BILITOT 0.5 03/23/2020   ALKPHOS 72 01/12/2014   AST 19 03/23/2020   ALT 17 03/23/2020   PROT 7.6 03/23/2020   ALBUMIN 4.2 01/12/2014   CALCIUM 9.4 03/23/2020   GFRAA 65 03/23/2020   QFTBGOLDPLUS NEGATIVE 06/25/2019    Speciality Comments: No specialty comments available.  Procedures:  No procedures performed Allergies: Erythromycin, Penicillins, Gadolinium derivatives, Iodinated diagnostic agents, Latex, Morphine, Other, Prednisone, Sulfonamide derivatives, Varenicline tartrate, Atorvastatin, and Aspirin   Assessment / Plan:     Visit Diagnoses: Seronegative rheumatoid arthritis (HCC): She has no joint tenderness or synovitis on exam.  She has not had any recent rheumatoid arthritis flares.  She is clinically doing well on Enbrel 50 mg subcutaneous injections once weekly.  She injected her Enbrel this morning and has a mild injection site reaction.  She was encouraged to use Benadryl cream topically as needed for symptomatic relief.  She was advised to notify us if the injection site reaction becomes a recurrent issue.  She continues to have morning stiffness lasting about 30 minutes daily.  She has not needed to take prednisone recently.  She will continue on the current treatment regimen.  She was advised to notify us if she develops increased joint pain or joint swelling. She will follow-up in the office in 5 months.  High risk medication use - Enbrel 50 mg sq injections once weekly-started on 11/05/19.  D/c  MTX 0.4 ml sq wkly inj-low dose- elevated creatinine and recurrent bronchitis.  CBC and CMP updated on 03/23/20. Orders for CBC and CMP released today.  Her next lab work will be due in July and every 3 months to monitor for drug toxicity.  Standing orders are in place.  TB gold negative on 06/25/19.  Order for TB gold released today. She has not had any recent infections. Advised to hold enbrel if she develops signs or symptoms of an infection  and to resume once the infection has completely cleared.   She has not received the covid-19 vaccine and does not plan to at this time.   - Plan: CBC with Differential/Platelet, COMPLETE METABOLIC PANEL WITH GFR, QuantiFERON-TB Gold Plus  Screening for tuberculosis Oorder for TB gold was released today.  Plan: QuantiFERON-TB Gold Plus  Muscle spasms of both lower extremities -  She has been experiencing intermittent muscle spasms in upper and lower extremities.  The episodes have become more frequent and severe over the past several months.  She continues to take magnesium supplement over-the-counter and has been taking Flexeril 10 mg by mouth at bedtime for symptomatic relief.  We will check magnesium and potassium level today.  Referral to neurology will be placed today as well.  Plan: COMPLETE METABOLIC PANEL WITH GFR, Magnesium  Muscle spasm, nocturnal - She has been experiencing muscle spasms in bilateral lower extremities at night for the past several months. We will obtain the following lab work.  Referral to neurology placed today. Plan: COMPLETE METABOLIC PANEL WITH GFR, Magnesium  Tenosynovitis of fingers: Resolved   Tenosynovitis of right wrist: Resolved   Discoid lupus: No recurrence.   DDD (degenerative disc disease), cervical: She has good ROM of the C-spine on exam.  DDD (degenerative disc disease), lumbar: She has painful ROM of the lumbar spine.    Fibromyalgia: She is not experiencing any myalgias or muscle tenderness at this time.  She has  not had any recent fibromyalgia flares.  She has been experiencing increased muscle spasms which have been more frequent and severe.  Muscle cramping: Denies any muscle cramps.  According to the patient she has been experiencing muscle spasms intermittently over the past several months.  The episodes have become more frequent and severe.  The spasms in her upper extremities are happening on a daily basis and the spasms in her legs.  Only happening at night.  She continues to take an over-the-counter magnesium supplement.  She is also been taking Flexeril 10 mg by mouth at bedtime for muscle spasms but has not noticed much improvement in her symptoms.  Other medical conditions are listed as follows:  Oral candidiasis: Resolved   History of diabetic gastroparesis  History of asthma  History of hyperlipidemia  Essential hypertension  History of type 2 diabetes mellitus  History of hypothyroidism  History of depression   History of COPD  Smoker   Orders: Orders Placed This Encounter  Procedures  . CBC with Differential/Platelet  . COMPLETE METABOLIC PANEL WITH GFR  . QuantiFERON-TB Gold Plus  . Magnesium   No orders of the defined types were placed in this encounter.     Follow-Up Instructions: Return in about 5 months (around 11/16/2020) for Rheumatoid arthritis, Fibromyalgia, DDD.   Gearldine Bienenstock, PA-C  Note - This record has been created using Dragon software.  Chart creation errors have been sought, but may not always  have been located. Such creation errors do not reflect on  the standard of medical care.

## 2020-06-16 ENCOUNTER — Encounter: Payer: Self-pay | Admitting: Physician Assistant

## 2020-06-16 ENCOUNTER — Other Ambulatory Visit: Payer: Self-pay

## 2020-06-16 ENCOUNTER — Ambulatory Visit: Payer: Medicare Other | Admitting: Physician Assistant

## 2020-06-16 VITALS — BP 131/72 | HR 73 | Resp 16 | Ht 65.0 in | Wt 145.6 lb

## 2020-06-16 DIAGNOSIS — F17200 Nicotine dependence, unspecified, uncomplicated: Secondary | ICD-10-CM

## 2020-06-16 DIAGNOSIS — M62838 Other muscle spasm: Secondary | ICD-10-CM

## 2020-06-16 DIAGNOSIS — B370 Candidal stomatitis: Secondary | ICD-10-CM

## 2020-06-16 DIAGNOSIS — M5136 Other intervertebral disc degeneration, lumbar region: Secondary | ICD-10-CM

## 2020-06-16 DIAGNOSIS — Z8659 Personal history of other mental and behavioral disorders: Secondary | ICD-10-CM

## 2020-06-16 DIAGNOSIS — Z8639 Personal history of other endocrine, nutritional and metabolic disease: Secondary | ICD-10-CM

## 2020-06-16 DIAGNOSIS — M0600 Rheumatoid arthritis without rheumatoid factor, unspecified site: Secondary | ICD-10-CM

## 2020-06-16 DIAGNOSIS — L930 Discoid lupus erythematosus: Secondary | ICD-10-CM | POA: Diagnosis not present

## 2020-06-16 DIAGNOSIS — I10 Essential (primary) hypertension: Secondary | ICD-10-CM

## 2020-06-16 DIAGNOSIS — M65931 Unspecified synovitis and tenosynovitis, right forearm: Secondary | ICD-10-CM

## 2020-06-16 DIAGNOSIS — M659 Synovitis and tenosynovitis, unspecified: Secondary | ICD-10-CM

## 2020-06-16 DIAGNOSIS — R252 Cramp and spasm: Secondary | ICD-10-CM

## 2020-06-16 DIAGNOSIS — M65949 Unspecified synovitis and tenosynovitis, unspecified hand: Secondary | ICD-10-CM

## 2020-06-16 DIAGNOSIS — Z79899 Other long term (current) drug therapy: Secondary | ICD-10-CM

## 2020-06-16 DIAGNOSIS — M797 Fibromyalgia: Secondary | ICD-10-CM

## 2020-06-16 DIAGNOSIS — M51369 Other intervertebral disc degeneration, lumbar region without mention of lumbar back pain or lower extremity pain: Secondary | ICD-10-CM

## 2020-06-16 DIAGNOSIS — M5030 Other cervical disc degeneration, unspecified cervical region: Secondary | ICD-10-CM

## 2020-06-16 DIAGNOSIS — Z111 Encounter for screening for respiratory tuberculosis: Secondary | ICD-10-CM

## 2020-06-16 DIAGNOSIS — Z8709 Personal history of other diseases of the respiratory system: Secondary | ICD-10-CM

## 2020-06-16 NOTE — Addendum Note (Signed)
Addended by: Ellen Henri on: 06/16/2020 03:55 PM   Modules accepted: Orders

## 2020-06-16 NOTE — Patient Instructions (Signed)
Standing Labs We placed an order today for your standing lab work.   Please have your standing labs drawn in July and every 3 months   If possible, please have your labs drawn 2 weeks prior to your appointment so that the provider can discuss your results at your appointment.  We have open lab daily Monday through Thursday from 1:30-4:30 PM and Friday from 1:30-4:00 PM at the office of Dr. Shaili Deveshwar, [facility].   Please be advised, all patients with office appointments requiring lab work will take precedents over walk-in lab work.  If possible, please come for your lab work on Monday and Friday afternoons, as you may experience shorter wait times. The office is located at [address], [address], [place], [place] [address] No appointment is necessary.   Labs are drawn by [facility]. Please bring your co-pay at the time of your lab draw.  You may receive a bill from [facility] for your lab work.  If you wish to have your labs drawn at another location, please call the office 24 hours in advance to send orders.  If you have any questions regarding directions or hours of operation,  please call [phone].   As a reminder, please drink plenty of water prior to coming for your lab work. Thanks!     

## 2020-06-19 LAB — CBC WITH DIFFERENTIAL/PLATELET
Absolute Monocytes: 529 cells/uL (ref 200–950)
Basophils Absolute: 115 cells/uL (ref 0–200)
Basophils Relative: 1 %
Eosinophils Absolute: 311 cells/uL (ref 15–500)
Eosinophils Relative: 2.7 %
HCT: 42.8 % (ref 35.0–45.0)
Hemoglobin: 13.9 g/dL (ref 11.7–15.5)
Lymphs Abs: 1863 cells/uL (ref 850–3900)
MCH: 29.2 pg (ref 27.0–33.0)
MCHC: 32.5 g/dL (ref 32.0–36.0)
MCV: 89.9 fL (ref 80.0–100.0)
MPV: 9.6 fL (ref 7.5–12.5)
Monocytes Relative: 4.6 %
Neutro Abs: 8683 cells/uL — ABNORMAL HIGH (ref 1500–7800)
Neutrophils Relative %: 75.5 %
Platelets: 284 10*3/uL (ref 140–400)
RBC: 4.76 10*6/uL (ref 3.80–5.10)
RDW: 12.8 % (ref 11.0–15.0)
Total Lymphocyte: 16.2 %
WBC: 11.5 10*3/uL — ABNORMAL HIGH (ref 3.8–10.8)

## 2020-06-19 LAB — COMPLETE METABOLIC PANEL WITH GFR
AG Ratio: 1.2 (calc) (ref 1.0–2.5)
ALT: 14 U/L (ref 6–29)
AST: 16 U/L (ref 10–35)
Albumin: 4.2 g/dL (ref 3.6–5.1)
Alkaline phosphatase (APISO): 84 U/L (ref 37–153)
BUN/Creatinine Ratio: 16 (calc) (ref 6–22)
BUN: 19 mg/dL (ref 7–25)
CO2: 24 mmol/L (ref 20–32)
Calcium: 9.3 mg/dL (ref 8.6–10.4)
Chloride: 98 mmol/L (ref 98–110)
Creat: 1.19 mg/dL — ABNORMAL HIGH (ref 0.60–0.93)
GFR, Est African American: 52 mL/min/{1.73_m2} — ABNORMAL LOW (ref >=60)
GFR, Est Non African American: 45 mL/min/{1.73_m2} — ABNORMAL LOW (ref >=60)
Globulin: 3.6 g/dL (calc) (ref 1.9–3.7)
Glucose, Bld: 161 mg/dL — ABNORMAL HIGH (ref 65–99)
Potassium: 4.2 mmol/L (ref 3.5–5.3)
Sodium: 136 mmol/L (ref 135–146)
Total Bilirubin: 0.3 mg/dL (ref 0.2–1.2)
Total Protein: 7.8 g/dL (ref 6.1–8.1)

## 2020-06-19 LAB — QUANTIFERON-TB GOLD PLUS
Mitogen-NIL: >10 IU/mL
NIL: 0.05 IU/mL
QuantiFERON-TB Gold Plus: NEGATIVE
TB1-NIL: 0 IU/mL
TB2-NIL: <0 IU/mL

## 2020-06-19 LAB — MAGNESIUM: Magnesium: 1.6 mg/dL (ref 1.5–2.5)

## 2020-06-20 NOTE — Progress Notes (Signed)
WBC count is borderline elevated but are trending down. Absolute neutrophils are elevated but stable.  Creatinine is elevated and trending up. GFR is low-45.  Please clarify if the patient has been taking any NSAIDs?

## 2020-07-13 ENCOUNTER — Encounter (INDEPENDENT_AMBULATORY_CARE_PROVIDER_SITE_OTHER): Payer: Medicare Other | Admitting: Ophthalmology

## 2020-07-21 ENCOUNTER — Other Ambulatory Visit: Payer: Self-pay | Admitting: *Deleted

## 2020-07-21 DIAGNOSIS — M0600 Rheumatoid arthritis without rheumatoid factor, unspecified site: Secondary | ICD-10-CM

## 2020-07-21 MED ORDER — ENBREL MINI 50 MG/ML ~~LOC~~ SOCT: 50.0000 mg | SUBCUTANEOUS | 2 refills | Status: DC

## 2020-07-21 NOTE — Telephone Encounter (Signed)
Next Visit 11/16/2020  Last Visit: 06/16/2020  Last Fill: 01/20/2020  DX: Seronegative rheumatoid arthritis   Current Dose per office note 06/16/2020, Enbrel 50 mg sq injections once weekly  Labs: 06/16/2020, WBC count is borderline elevated but are trending down. Absolute neutrophils are elevated but stable.  Creatinine is elevated and trending up. GFR is low-45. Please clarify if the patient has been taking any NSAIDs?   TB Gold: 06/16/2020, negative  Okay to refill Enbrel?

## 2020-08-18 ENCOUNTER — Other Ambulatory Visit: Payer: Medicare Other

## 2020-09-17 ENCOUNTER — Emergency Department (HOSPITAL_BASED_OUTPATIENT_CLINIC_OR_DEPARTMENT_OTHER): Payer: Medicare Other | Admitting: Radiology

## 2020-09-17 ENCOUNTER — Encounter (HOSPITAL_BASED_OUTPATIENT_CLINIC_OR_DEPARTMENT_OTHER): Payer: Self-pay | Admitting: Emergency Medicine

## 2020-09-17 ENCOUNTER — Emergency Department (HOSPITAL_BASED_OUTPATIENT_CLINIC_OR_DEPARTMENT_OTHER)
Admission: EM | Admit: 2020-09-17 | Discharge: 2020-09-17 | Disposition: A | Discharge status: Home / Self Care | Payer: Medicare Other | Attending: Emergency Medicine | Admitting: Emergency Medicine

## 2020-09-17 DIAGNOSIS — F17210 Nicotine dependence, cigarettes, uncomplicated: Secondary | ICD-10-CM | POA: Insufficient documentation

## 2020-09-17 DIAGNOSIS — E11649 Type 2 diabetes mellitus with hypoglycemia without coma: Secondary | ICD-10-CM | POA: Diagnosis not present

## 2020-09-17 DIAGNOSIS — Z794 Long term (current) use of insulin: Secondary | ICD-10-CM | POA: Insufficient documentation

## 2020-09-17 DIAGNOSIS — Z79899 Other long term (current) drug therapy: Secondary | ICD-10-CM | POA: Diagnosis not present

## 2020-09-17 DIAGNOSIS — E039 Hypothyroidism, unspecified: Secondary | ICD-10-CM | POA: Diagnosis not present

## 2020-09-17 DIAGNOSIS — I10 Essential (primary) hypertension: Secondary | ICD-10-CM | POA: Insufficient documentation

## 2020-09-17 DIAGNOSIS — J45909 Unspecified asthma, uncomplicated: Secondary | ICD-10-CM | POA: Insufficient documentation

## 2020-09-17 DIAGNOSIS — Z91040 Latex allergy status: Secondary | ICD-10-CM | POA: Diagnosis not present

## 2020-09-17 DIAGNOSIS — E162 Hypoglycemia, unspecified: Secondary | ICD-10-CM

## 2020-09-17 DIAGNOSIS — Z20822 Contact with and (suspected) exposure to covid-19: Secondary | ICD-10-CM | POA: Insufficient documentation

## 2020-09-17 DIAGNOSIS — Z7984 Long term (current) use of oral hypoglycemic drugs: Secondary | ICD-10-CM | POA: Diagnosis not present

## 2020-09-17 DIAGNOSIS — J449 Chronic obstructive pulmonary disease, unspecified: Secondary | ICD-10-CM | POA: Diagnosis not present

## 2020-09-17 LAB — URINALYSIS, ROUTINE W REFLEX MICROSCOPIC
Bilirubin Urine: NEGATIVE
Glucose, UA: NEGATIVE mg/dL
Hgb urine dipstick: NEGATIVE
Ketones, ur: NEGATIVE mg/dL
Leukocytes,Ua: NEGATIVE
Nitrite: NEGATIVE
Protein, ur: NEGATIVE mg/dL
Specific Gravity, Urine: 1.014 (ref 1.005–1.030)
pH: 5.5 (ref 5.0–8.0)

## 2020-09-17 LAB — COMPREHENSIVE METABOLIC PANEL
ALT: 13 U/L (ref 0–44)
AST: 16 U/L (ref 15–41)
Albumin: 3.9 g/dL (ref 3.5–5.0)
Alkaline Phosphatase: 71 U/L (ref 38–126)
Anion gap: 10 (ref 5–15)
BUN: 23 mg/dL (ref 8–23)
CO2: 25 mmol/L (ref 22–32)
Calcium: 9.2 mg/dL (ref 8.9–10.3)
Chloride: 99 mmol/L (ref 98–111)
Creatinine, Ser: 1.09 mg/dL — ABNORMAL HIGH (ref 0.44–1.00)
GFR, Estimated: 53 mL/min — ABNORMAL LOW (ref >60)
Glucose, Bld: 61 mg/dL — ABNORMAL LOW (ref 70–99)
Potassium: 4.2 mmol/L (ref 3.5–5.1)
Sodium: 134 mmol/L — ABNORMAL LOW (ref 135–145)
Total Bilirubin: 0.5 mg/dL (ref 0.3–1.2)
Total Protein: 7.9 g/dL (ref 6.5–8.1)

## 2020-09-17 LAB — RESP PANEL BY RT-PCR (FLU A&B, COVID) ARPGX2
Influenza A by PCR: NEGATIVE
Influenza B by PCR: NEGATIVE
SARS Coronavirus 2 by RT PCR: NEGATIVE

## 2020-09-17 LAB — CBC
HCT: 41.6 % (ref 36.0–46.0)
Hemoglobin: 13.8 g/dL (ref 12.0–15.0)
MCH: 29.7 pg (ref 26.0–34.0)
MCHC: 33.2 g/dL (ref 30.0–36.0)
MCV: 89.5 fL (ref 80.0–100.0)
Platelets: 334 10*3/uL (ref 150–400)
RBC: 4.65 MIL/uL (ref 3.87–5.11)
RDW: 14.2 % (ref 11.5–15.5)
WBC: 15.3 10*3/uL — ABNORMAL HIGH (ref 4.0–10.5)
nRBC: 0 % (ref 0.0–0.2)

## 2020-09-17 LAB — CBG MONITORING, ED
Glucose-Capillary: 90 mg/dL (ref 70–99)
Glucose-Capillary: 138 mg/dL — ABNORMAL HIGH (ref 70–99)

## 2020-09-17 NOTE — Discharge Instructions (Addendum)
Your testing today has been very reassuring, there is no signs of pneumonia on your chest x-ray, your urine sample is clean without infection, your blood work was reassuring except for your blood sugar which is a little bit low.  We have given you some food, I want you to check your blood sugar several times today and if it continues to drop keep eating more.  Please do not take 50 units of insulin tonight rather cut it back to 45.  You will need to follow-up with your doctor first thing on Monday morning to readjust her medications.  Please return to the emergency department for severe worsening symptoms.

## 2020-09-17 NOTE — ED Provider Notes (Signed)
[facility] EMERGENCY DEPT Provider Note   CSN: [id number] Arrival date & time: 09/17/20  1113     History Chief Complaint  Patient presents with   Hyperglycemia    [name] is a 75 y.o. female.   Hyperglycemia  The patient has had hyperglycemia especially over the last month not seeming to do well with her daily insulin, called her doctor yesterday, was told to increase the dose of her insulin because of her blood sugar that was so elevated, it was over 400 last night.  She took increased insulin last night this morning it was 90 but they wanted to be checked to make sure there is no underlying infection as it was driving the hyperglycemia.  The patient has no urinary symptoms, she has chronic cough, she still smokes a pack of cigarettes a day, no other symptoms rashes or swelling.  Past Medical History:  Diagnosis Date   Arthritis    "little bit qwhere" (01/24/2012)   Asthma    Chronic back pain    "neck to tailbone" (01/24/2012)   Chronic bronchitis (HCC)    "last time ~ 1 month ago" (01/24/2012)   COPD (chronic obstructive pulmonary disease) (HCC)    Depression    takes Zoloft daily   Fibromyalgia    History of bladder infections    sees a urologist--on long term Macrodantin nightly   History of blood transfusion    as a child   History of blood transfusion    "when I was a child" (01/24/2012)   Hyperlipidemia    takes Simvastatin daily   Hypertension    takes Maxzide daily   Hypothyroidism    takes Synthroid daily   Insomnia    takes Ambien nightly   Lupus (HCC)    Neck pain    herniated disc and radiculopathy   Peripheral neuropathy    Pneumonia 2012   "have had it a couple times" (01/24/2012)   Smokers' cough (HCC)    has albuterol prn   Type II diabetes mellitus (HCC)    takes Janumet daily   Urinary frequency     Patient Active Problem List   Diagnosis Date Noted   Asthma 11/27/2019   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   Gastroesophageal reflux disease without esophagitis 11/27/2019   Generalized anxiety disorder 11/27/2019   Hypothyroidism 11/27/2019   Smoker 11/27/2019   Type 2 diabetes mellitus without complication (HCC) 11/27/2019   Varicose veins of lower extremity 11/27/2019   Vitamin D deficiency 11/27/2019   Arthritis of hand 11/05/2019   Inflammation of joint of both hands 11/05/2019   Encounter for orthopedic follow-up care 01/09/2019   Tenosynovitis of fingers 09/22/2018   Tenosynovitis of right wrist 09/22/2018   Pain in right hand 09/08/2018   Nonintractable headache 08/16/2016   Carotid artery tenderness 10/26/2015   Leg pain, left 07/27/2014   Back pain of thoracolumbar region 08/11/2012   Renal cyst 08/04/2012   Cervical spondylosis with radiculopathy 01/24/2012   Chronic interstitial cystitis 11/01/2011   Mixed incontinence 11/01/2011   EMPHYSEMA 08/05/2008   LEUKOCYTOSIS 06/25/2008   COPD 06/25/2008   GASTROPARESIS 06/25/2008   DM 06/14/2008   HYPERLIPIDEMIA 06/14/2008   Essential hypertension 06/14/2008   ASTHMATIC BRONCHITIS, ACUTE 06/14/2008   ALLERGIC RHINITIS 06/14/2008   LUPUS 06/14/2008    Past Surgical History:  Procedure Laterality Date   ANTERIOR CERVICAL DECOMP/DISCECTOMY FUSION  2002; 01/24/2012   ANTERIOR CERVICAL DECOMP/DISCECTOMY FUSION  01/24/2012   Procedure: ANTERIOR CERVICAL DECOMPRESSION/DISCECTOMY  FUSION 1 LEVEL;  Surgeon: Cristi Loron, MD;  Location: [facility];  Service: Neurosurgery;  Laterality: N/A;  CERVICAL THREE-FOUR anterior cervical decompression with fusion interbody prothesis plating and bonegraft   APPENDECTOMY  1960's   BACK SURGERY  2001   BLADDER SURGERY  1977   through abdomen   BLADDER SUSPENSION  1974   BUNIONECTOMY  1980's   left   CARDIAC CATHETERIZATION  2008   COLONOSCOPY     ESOPHAGOGASTRODUODENOSCOPY     LUMBAR FUSION  2009   RECTOCELE REPAIR  2000   VAGINAL HYSTERECTOMY  1974     OB History   No  obstetric history on file.     Family History  Problem Relation Age of Onset   Heart disease Mother    Arrhythmia Father    Heart disease Sister    Heart attack Brother    Diabetes Brother     Social History   Tobacco Use   Smoking status: Every Day    Packs/day: 1.00    Years: 60.00    Pack years: 60.00    Types: Cigarettes   Smokeless tobacco: Never  Vaping Use   Vaping Use: Former  Substance Use Topics   Alcohol use: No    Alcohol/week: 0.0 standard drinks   Drug use: No    Home Medications Prior to Admission medications   Medication Sig Start Date End Date Taking? Authorizing Provider  albuterol (VENTOLIN HFA) 108 (90 Base) MCG/ACT inhaler as needed. 04/20/19   [provider]  BREZTRI AEROSPHERE 160-9-4.8 MCG/ACT AERO 2 (two) times daily. 06/16/19   [provider]  cyclobenzaprine (FLEXERIL) 10 MG tablet Take 10 mg by mouth 2 (two) times daily as needed. 06/16/19   [provider]  Etanercept (ENBREL MINI) 50 MG/ML SOCT Inject 50 mg into the skin once a week. 07/21/20   Pollyann Savoy, MD  insulin glargine, 1 Unit Dial, (TOUJEO SOLOSTAR) 300 UNIT/ML Solostar Pen Toujeo SoloStar U-300 Insulin 300 unit/mL (1.5 mL) subcutaneous pen    [provider]  levothyroxine (SYNTHROID, LEVOTHROID) 50 MCG tablet Take 50 mcg by mouth daily before breakfast.    [provider]  MAGNESIUM PO Take by mouth daily.    [provider]  metFORMIN (GLUCOPHAGE) 1000 MG tablet Take 1,000 mg by mouth daily.  09/14/15   [provider]  metoCLOPramide (REGLAN) 5 MG tablet Take 5 mg by mouth daily.     [provider]  nitroGLYCERIN (NITROSTAT) 0.4 MG SL tablet Place 1 tablet (0.4 mg total) under the tongue every 5 (five) minutes x 3 doses as needed for chest pain. If no relief after 3rd dose, proceed to the ED for an evaluation 05/03/15   Laqueta Linden, MD  [facility] VERIO test strip 1 each 4 (four) times daily. 11/10/19    [provider]  sertraline (ZOLOFT) 100 MG tablet Take 100 mg by mouth daily.     [provider]  triamterene-hydrochlorothiazide (MAXZIDE-25) 37.5-25 MG tablet Take 1 tablet by mouth daily.    [provider]  VITAMIN D PO Take by mouth daily.    [provider]  zolpidem (AMBIEN) 5 MG tablet Take 5 mg by mouth at bedtime. For sleep    [provider]    Allergies    Erythromycin, Penicillins, Gadolinium derivatives, Iodinated diagnostic agents, Latex, Morphine, Other, Prednisone, Sulfonamide derivatives, Varenicline tartrate, Atorvastatin, and Aspirin  Review of Systems   Review of Systems  All other systems  reviewed and are negative.  Physical Exam Updated Vital Signs BP 139/82   Pulse 82   Temp 98.2 F (36.8 C)   Resp 18   SpO2 98%   Physical Exam Vitals and nursing note reviewed.  Constitutional:      General: She is not in acute distress.    Appearance: She is well-developed.  HENT:     Head: Normocephalic and atraumatic.     Mouth/Throat:     Pharynx: No oropharyngeal exudate.  Eyes:     General: No scleral icterus.       Right eye: No discharge.        Left eye: No discharge.     Conjunctiva/sclera: Conjunctivae normal.     Pupils: Pupils are equal, round, and reactive to light.  Neck:     Thyroid: No thyromegaly.     Vascular: No JVD.  Cardiovascular:     Rate and Rhythm: Normal rate and regular rhythm.     Heart sounds: Normal heart sounds. No murmur heard.   No friction rub. No gallop.  Pulmonary:     Effort: Pulmonary effort is normal. No respiratory distress.     Breath sounds: Wheezing present. No rales.     Comments: Wheezign on expiration - mild.  Full sentences Abdominal:     General: Bowel sounds are normal. There is no distension.     Palpations: Abdomen is soft. There is no mass.     Tenderness: There is no abdominal tenderness.  Musculoskeletal:        General: No tenderness. Normal range of  motion.     Cervical back: Normal range of motion and neck supple.  Lymphadenopathy:     Cervical: No cervical adenopathy.  Skin:    General: Skin is warm and dry.     Findings: No erythema or rash.  Neurological:     Mental Status: She is alert.     Coordination: Coordination normal.     Comments: Normal speech, normal facial symmetry, normal memory, moves all 4 extremities per normal  Psychiatric:        Behavior: Behavior normal.    ED Results / Procedures / Treatments   Labs (all labs ordered are listed, but only abnormal results are displayed) Labs Reviewed  CBC - Abnormal; Notable for the following components:      Result Value   WBC 15.3 (*)    All other components within normal limits  COMPREHENSIVE METABOLIC PANEL - Abnormal; Notable for the following components:   Sodium 134 (*)    Glucose, Bld 61 (*)    Creatinine, Ser 1.09 (*)    GFR, Estimated 53 (*)    All other components within normal limits  URINE CULTURE  RESP PANEL BY RT-PCR (FLU A&B, COVID) ARPGX2  URINALYSIS, ROUTINE W REFLEX MICROSCOPIC  CBG MONITORING, ED    EKG None  Radiology DG Chest 2 View  Result Date: 09/17/2020 CLINICAL DATA:  Cough, shortness of breath for the past 2-4 months EXAM: CHEST - 2 VIEW COMPARISON:  None. FINDINGS: The lungs are clear and negative for focal airspace consolidation, pulmonary edema or suspicious pulmonary nodule. No pleural effusion or pneumothorax. Cardiac and mediastinal contours are within normal limits. No acute fracture or lytic or blastic osseous lesions. The visualized upper abdominal bowel gas pattern is unremarkable. Trace atherosclerotic calcifications in the transverse aorta. IMPRESSION: No active cardiopulmonary disease. Electronically Signed   By: Malachy MoanHeath  McCullough M.D.   On: 09/17/2020 12:14  Procedures Procedures   Medications Ordered in ED Medications - No data to display  ED Course  I have reviewed the triage vital signs and the nursing  notes.  Pertinent labs & imaging results that were available during my care of the patient were reviewed by me and considered in my medical decision making (see chart for details).    MDM Rules/Calculators/A&P                          The patient is well-appearing, vital signs are essentially unremarkable, blood sugar was 90, no neuro symptoms, no infectious symptoms, check urinalysis and labs, patient agreeable  Labs unremarkable including urinalysis chest radiograph and blood work, her blood sugar has dropped a little bit down to 62, at this time she appears well, she was given food and a soda and is doing very well, she wants to go home which I think is reasonable, she has normal mental status, she understands that she needs to decrease her insulin dose instead of 50 units at night to go to 45.  Final Clinical Impression(s) / ED Diagnoses Final diagnoses:  Hypoglycemia    Rx / DC Orders ED Discharge Orders     None        Eber Hong, MD 09/17/20 1453

## 2020-09-17 NOTE — ED Triage Notes (Signed)
Congested cough (since March 2021 COVID), intermittent. Pt is congested ,pt stopped taking her inhaler 2 days ago. Denies fever/n/v. Primary wanted her to be seen for chest xray in case she has infection, pt reports her cbg has been in 400's with no change in diet or meds.

## 2020-09-18 LAB — URINE CULTURE

## 2020-10-06 ENCOUNTER — Other Ambulatory Visit: Payer: Self-pay | Admitting: Rheumatology

## 2020-10-06 DIAGNOSIS — M0600 Rheumatoid arthritis without rheumatoid factor, unspecified site: Secondary | ICD-10-CM

## 2020-10-06 MED ORDER — ENBREL MINI 50 MG/ML ~~LOC~~ SOCT: 50.0000 mg | SUBCUTANEOUS | 2 refills | Status: DC

## 2020-10-06 NOTE — Telephone Encounter (Signed)
Patient requesting a refill on Enbrel sent to the [facility]. Patient has a weeks worth left.

## 2020-10-06 NOTE — Telephone Encounter (Signed)
Next Visit: 11/16/2020  Last Visit: 06/16/2020  Last Fill:   AV:[id number] rheumatoid arthritis  Current Dose per office note 06/16/2020: Enbrel 50 mg sq injections once weekly  Labs: 09/17/2020 WBC 15.3, Sodium 134, Glucose 61, Creatinine 1.09, GFR 53  TB Gold: 06/16/2020  Neg   Okay to refill Enbrel Mini?

## 2020-10-18 ENCOUNTER — Ambulatory Visit (INDEPENDENT_AMBULATORY_CARE_PROVIDER_SITE_OTHER): Payer: Medicare Other | Admitting: Ophthalmology

## 2020-10-18 ENCOUNTER — Encounter (INDEPENDENT_AMBULATORY_CARE_PROVIDER_SITE_OTHER): Payer: Self-pay | Admitting: Ophthalmology

## 2020-10-18 ENCOUNTER — Other Ambulatory Visit: Payer: Self-pay

## 2020-10-18 DIAGNOSIS — H3581 Retinal edema: Secondary | ICD-10-CM

## 2020-10-18 DIAGNOSIS — E119 Type 2 diabetes mellitus without complications: Secondary | ICD-10-CM

## 2020-10-18 DIAGNOSIS — H353132 Nonexudative age-related macular degeneration, bilateral, intermediate dry stage: Secondary | ICD-10-CM

## 2020-10-18 DIAGNOSIS — Z961 Presence of intraocular lens: Secondary | ICD-10-CM

## 2020-10-18 DIAGNOSIS — I10 Essential (primary) hypertension: Secondary | ICD-10-CM

## 2020-10-18 DIAGNOSIS — H35033 Hypertensive retinopathy, bilateral: Secondary | ICD-10-CM

## 2020-10-18 DIAGNOSIS — H353131 Nonexudative age-related macular degeneration, bilateral, early dry stage: Secondary | ICD-10-CM

## 2020-10-18 NOTE — Progress Notes (Signed)
[facility] - Clinic Note  10/18/2020     CHIEF COMPLAINT Patient presents for Retina Evaluation   HISTORY OF PRESENT ILLNESS: [name] is a 75 y.o. female who presents to the clinic today for:   HPI     Retina Evaluation   In both eyes.  This started 22 years ago.  Duration of 4 months.  Associated Symptoms Flashes, Floaters and Photophobia.  Context:  distance vision and near vision.  I, the attending physician,  performed the HPI with the patient and updated documentation appropriately.        Comments   New retinal eval for Diabetic Eye Exam, self referred. Pt states she has been diabetic for 22 years and in the past 3-4 months she has notice a decrease in vision. Reports her sugar levels have been uncontrolled, was in ED 1 mo ago due to a sugar spike in the 400 range then a drop to below 100. Pt reports high levels of stress due to son's cancer diagnosis 2-3 months ago. Last A1C is unknown, taken in October. Sugar level this morning was 100 after breakfast. Levels will sometimes spike to above 300 throughout the day. Metformin and Insulin doses have been doubled per PCP instruction. Pt reports until recently her levels have been well controlled, no issues with vision historically. Had cataract surgery OU 6-7 years ago by Dr. Dione BoozeGroat. Some floaters and flashes that occur here and there reported. Last AEE was 3-4 years ago w/ Dr. Dione BoozeGroat. No current rx specs, only wears OTC readers.       Last edited by Rennis ChrisZamora, Jehu Mccauslin, MD on 10/20/2020 12:45 PM.    Pt here on self referral for DEE and decreased vision, pt felt like her diabetes might be the reason that her vision is decreasing, she states she cannot read on the television anymore, pt has rheumatoid arthritis and lupus for which she takes Enbrel injections once a week, pt has had cataract sx with Dr. Dione BoozeGroat, pt states her blood sugar has been in the high 300's for the last week, she states it has come down  from the 400's, pt has been very worried about her son who has bilateral lung cancer, pt states she is no longer on BP medication, her dr took her off of it to help with her diabetes  Referring physician: Ernesto RutherfordGroat, Robert, MD [address] [place] [id number]  HISTORICAL INFORMATION:   Selected notes from the MEDICAL RECORD NUMBER Self referral for DEE -- pt of Dr. Dione BoozeGroat [name]:  Ocular Hx- PMH-    CURRENT MEDICATIONS: No current outpatient medications on file. (Ophthalmic Drugs)   No current facility-administered medications for this visit. (Ophthalmic Drugs)   Current Outpatient Medications (Other)  Medication Sig   albuterol (VENTOLIN HFA) 108 (90 Base) MCG/ACT inhaler as needed.   BREZTRI AEROSPHERE 160-9-4.8 MCG/ACT AERO 2 (two) times daily.   cyclobenzaprine (FLEXERIL) 10 MG tablet Take 10 mg by mouth 2 (two) times daily as needed.   Etanercept (ENBREL MINI) 50 MG/ML SOCT Inject 50 mg into the skin once a week.   insulin glargine, 1 Unit Dial, (TOUJEO SOLOSTAR) 300 UNIT/ML Solostar Pen Toujeo SoloStar U-300 Insulin 300 unit/mL (1.5 mL) subcutaneous pen   levothyroxine (SYNTHROID, LEVOTHROID) 50 MCG tablet Take 50 mcg by mouth daily before breakfast.   MAGNESIUM PO Take by mouth daily.   metFORMIN (GLUCOPHAGE) 1000 MG tablet Take 1,000 mg by mouth daily.    metoCLOPramide (  REGLAN) 5 MG tablet Take 5 mg by mouth daily.    nitroGLYCERIN (NITROSTAT) 0.4 MG SL tablet Place 1 tablet (0.4 mg total) under the tongue every 5 (five) minutes x 3 doses as needed for chest pain. If no relief after 3rd dose, proceed to the ED for an evaluation   ONETOUCH VERIO test strip 1 each 4 (four) times daily.   sertraline (ZOLOFT) 100 MG tablet Take 100 mg by mouth daily.    triamterene-hydrochlorothiazide (MAXZIDE-25) 37.5-25 MG tablet Take 1 tablet by mouth daily. (Patient not taking: Reported on 10/18/2020)   VITAMIN D PO Take by mouth daily.   zolpidem (AMBIEN) 5 MG tablet Take 5 mg by mouth at  bedtime. For sleep   No current facility-administered medications for this visit. (Other)      REVIEW OF SYSTEMS: ROS   Positive for: Endocrine, Cardiovascular, Respiratory, Psychiatric Last edited by Thompson Grayer, COT on 10/18/2020  1:16 PM.       ALLERGIES Allergies  Allergen Reactions   Erythromycin Anaphylaxis   Penicillins Anaphylaxis   Gadolinium Derivatives Hives and Rash    Pt broke out in hives all over body.  Has IV steroids and Benadryl in hospital for MRI.   Iodinated Diagnostic Agents Hives    13-hour prep Pt had slight itching of her abd after her injection and had taken her 13 hr prep.  Consult with radiologist before another procedure to see if IV contrast is necessary, per Dr Roswell Nickel on 01/06/14, JB/   Latex Dermatitis and Other (See Comments)    blisters   Morphine Nausea And Vomiting   Other Itching and Other (See Comments)    Chlorhexin (CHG); "burning"   Prednisone Other (See Comments)    "shakes me out of my frame" IM steroids aren't as bad as PO   Sulfonamide Derivatives Itching and Rash    All over body    Varenicline Tartrate Nausea And Vomiting        Atorvastatin    Aspirin Nausea Only and Other (See Comments)    Burning in stomach     PAST MEDICAL HISTORY Past Medical History:  Diagnosis Date   Arthritis    "little bit qwhere" (01/24/2012)   Asthma    Chronic back pain    "neck to tailbone" (01/24/2012)   Chronic bronchitis (HCC)    "last time ~ 1 month ago" (01/24/2012)   COPD (chronic obstructive pulmonary disease) (HCC)    Depression    takes Zoloft daily   Fibromyalgia    History of bladder infections    sees a urologist--on long term Macrodantin nightly   History of blood transfusion    as a child   History of blood transfusion    "when I was a child" (01/24/2012)   Hyperlipidemia    takes Simvastatin daily   Hypertension    takes Maxzide daily   Hypothyroidism    takes Synthroid daily   Insomnia    takes  Ambien nightly   Lupus (HCC)    Neck pain    herniated disc and radiculopathy   Peripheral neuropathy    Pneumonia 2012   "have had it a couple times" (01/24/2012)   Smokers' cough (HCC)    has albuterol prn   Type II diabetes mellitus (HCC)    takes Janumet daily   Urinary frequency    Past Surgical History:  Procedure Laterality Date   ANTERIOR CERVICAL DECOMP/DISCECTOMY FUSION  2002; 01/24/2012   ANTERIOR CERVICAL DECOMP/DISCECTOMY FUSION  01/24/2012   Procedure: ANTERIOR CERVICAL DECOMPRESSION/DISCECTOMY FUSION 1 LEVEL;  Surgeon: Cristi Loron, MD;  Location: [facility];  Service: Neurosurgery;  Laterality: N/A;  CERVICAL THREE-FOUR anterior cervical decompression with fusion interbody prothesis plating and bonegraft   APPENDECTOMY  11/04/1958   BACK SURGERY  03/06/1999   BLADDER SURGERY  03/06/1975   through abdomen   BLADDER SUSPENSION  03/05/1972   BUNIONECTOMY  11/04/1978   left   CARDIAC CATHETERIZATION  03/05/2006   CATARACT EXTRACTION     COLONOSCOPY     ESOPHAGOGASTRODUODENOSCOPY     LUMBAR FUSION  03/06/2007   RECTOCELE REPAIR  03/05/1998   VAGINAL HYSTERECTOMY  03/05/1972    FAMILY HISTORY Family History  Problem Relation Age of Onset   Heart disease Mother    Arrhythmia Father    Diabetes Sister    Heart disease Sister    Blindness Brother    Heart attack Brother    Diabetes Brother     SOCIAL HISTORY Social History   Tobacco Use   Smoking status: Every Day    Packs/day: 1.00    Years: 60.00    Pack years: 60.00    Types: Cigarettes   Smokeless tobacco: Never  Vaping Use   Vaping Use: Former  Substance Use Topics   Alcohol use: No    Alcohol/week: 0.0 standard drinks   Drug use: No         OPHTHALMIC EXAM:  Base Eye Exam     Visual Acuity (Snellen - Linear)       Right Left   Dist [place] 20/50 +2 20/30 +2   Dist ph [place] 20/30 +1 NI         Tonometry (Tonopen, 1:51 PM)       Right Left   Pressure 12 12          Pupils       Dark Light Shape React APD   Right 2 1 Round Minimal None   Left 2 1 Round Minimal None         Visual Fields       Left Right    Full Full         Extraocular Movement       Right Left    Full, Ortho Full, Ortho         Neuro/Psych     Oriented x3: Yes   Mood/Affect: Normal         Dilation     Both eyes: 1.0% Mydriacyl, 2.5% Phenylephrine @ 1:52 PM           Slit Lamp and Fundus Exam     Slit Lamp Exam       Right Left   Lids/Lashes Dermatochalasis - upper lid Dermatochalasis - upper lid   Conjunctiva/Sclera White and quiet White and quiet   Cornea arcus, trace PEE, mild central haze, well healed cataract wound arcus, 1+ infeiror PEE, mild central haze, well healed cataract wound   Anterior Chamber deep and clear, no cell or flare deep and clear, no cell or flare   Iris Round and dilated Round and dilated   Lens PC IOL in good position PC IOL in good position   Vitreous Vitreous syneresis, no cell or pigment Vitreous syneresis, no cell or pigment         Fundus Exam       Right Left   Disc mild Pallor, Sharp rim mild Pallor, Sharp rim, +SVP   C/D Ratio  0.3 0.4   Macula Flat, Good foveal reflex, Drusen, mild RPE mottling Flat, Good foveal reflex, Drusen, RPE mottling and clumping, vitelliform like lesion, No heme or edema   Vessels Tortuous Tortuous   Periphery Attached, No heme  Attached, No heme            Refraction     Wearing Rx   Pt only wears +2.50 OTC readers.         Manifest Refraction       Sphere Cylinder Axis Dist VA   Right -0.75 +0.50 170 20/30-2   Left -0.50 +0.25 130 20/30+2            IMAGING AND PROCEDURES  Imaging and Procedures for 10/18/2020  OCT, Retina - OU - Both Eyes       Right Eye Quality was good. Central Foveal Thickness: 287. Progression has no prior data. Findings include normal foveal contour, no IRF, no SRF, retinal drusen .   Left Eye Quality was good. Central Foveal  Thickness: 282. Progression has no prior data. Findings include normal foveal contour, no IRF, no SRF, retinal drusen , subretinal hyper-reflective material (Mild central SRHM / vitelliform like lesion, no DME).   Notes *Images captured and stored on drive  Diagnosis / Impression:  NFP; no IRF/SRF OU No DME OU OS: Mild central SRHM / vitelliform like lesion, no DME   Clinical management:  See below  Abbreviations: NFP - Normal foveal profile. CME - cystoid macular edema. PED - pigment epithelial detachment. IRF - intraretinal fluid. SRF - subretinal fluid. EZ - ellipsoid zone. ERM - epiretinal membrane. ORA - outer retinal atrophy. ORT - outer retinal tubulation. SRHM - subretinal hyper-reflective material. IRHM - intraretinal hyper-reflective material              ASSESSMENT/PLAN:    ICD-10-CM   1. Diabetes mellitus type 2 without retinopathy (HCC)  E11.9     2. Retinal edema  H35.81 OCT, Retina - OU - Both Eyes    3. Intermediate stage nonexudative age-related macular degeneration of both eyes  H35.3132     4. Essential hypertension  I10     5. Hypertensive retinopathy of both eyes  H35.033     6. Pseudophakia of both eyes  Z96.1      **Self-referral -- pt's sister is BZ pt, Russelene Disher -- for retina evaluation** - pt has had BG elevated to 300s and reports subjective decline in vision -- concerned about diabetic retinopathy  1,2. Diabetes mellitus, type 2 without retinopathy  - exam with no MA or heme OU - The incidence, risk factors for progression, natural history and treatment options for diabetic retinopathy  were discussed with patient.   - The need for close monitoring of blood glucose, blood pressure, and serum lipids, avoiding cigarette or any type of tobacco, and the need for long term follow up was also discussed with patient. - f/u in 1 year, sooner prn  3. Age related macular degeneration, non-exudative, both eyes  - intermediate stage with near  confluent drusen and early atrophy  - discussed ARMD as the most likely cause of visual symptoms  - The incidence, anatomy, and pathology of dry AMD, risk of progression, and the AREDS and AREDS 2 study including smoking risks discussed with patient.  - Recommend amsler grid monitoring  - f/u 3 mos  4,5. Hypertensive retinopathy OU - discussed importance of tight BP control - monitor  6. Pseudophakia OU  - s/p CE/IOL OU (  R. Groat)  - IOLs in good position, doing well  - monitor   Ophthalmic Meds Ordered this visit:  No orders of the defined types were placed in this encounter.      Return in about 3 months (around 01/18/2021) for f/u non exu ARMD OU, DFE, OCT.  There are no Patient Instructions on file for this visit.   Explained the diagnoses, plan, and follow up with the patient and they expressed understanding.  Patient expressed understanding of the importance of proper follow up care.   This document serves as a record of services personally performed by Karie Chimera, MD, PhD. It was created on their behalf by Glee Arvin. Manson Passey, OA an ophthalmic technician. The creation of this record is the provider's dictation and/or activities during the visit.    Electronically signed by: Glee Arvin. Kristopher Oppenheim 08.16.2022 12:54 PM   Karie Chimera, M.D., Ph.D. Diseases & Surgery of the Retina and Vitreous [facility]  I have reviewed the above documentation for accuracy and completeness, and I agree with the above. Karie Chimera, M.D., Ph.D. 10/20/20 12:54 PM   Abbreviations: M myopia (nearsighted); A astigmatism; H hyperopia (farsighted); P presbyopia; Mrx spectacle prescription;  CTL contact lenses; OD right eye; OS left eye; OU both eyes  XT exotropia; ET esotropia; PEK punctate epithelial keratitis; PEE punctate epithelial erosions; DES dry eye syndrome; MGD meibomian gland dysfunction; ATs artificial tears; PFAT's preservative free artificial tears; NSC  nuclear sclerotic cataract; PSC posterior subcapsular cataract; ERM epi-retinal membrane; PVD posterior vitreous detachment; RD retinal detachment; DM diabetes mellitus; DR diabetic retinopathy; NPDR non-proliferative diabetic retinopathy; PDR proliferative diabetic retinopathy; CSME clinically significant macular edema; DME diabetic macular edema; dbh dot blot hemorrhages; CWS cotton wool spot; POAG primary open angle glaucoma; C/D cup-to-disc ratio; HVF humphrey visual field; GVF goldmann visual field; OCT optical coherence tomography; IOP intraocular pressure; BRVO Branch retinal vein occlusion; CRVO central retinal vein occlusion; CRAO central retinal artery occlusion; BRAO branch retinal artery occlusion; RT retinal tear; SB scleral buckle; PPV pars plana vitrectomy; VH Vitreous hemorrhage; PRP panretinal laser photocoagulation; IVK intravitreal kenalog; VMT vitreomacular traction; MH Macular hole;  NVD neovascularization of the disc; NVE neovascularization elsewhere; AREDS age related eye disease study; ARMD age related macular degeneration; POAG primary open angle glaucoma; EBMD epithelial/anterior basement membrane dystrophy; ACIOL anterior chamber intraocular lens; IOL intraocular lens; PCIOL posterior chamber intraocular lens; Phaco/IOL phacoemulsification with intraocular lens placement; PRK photorefractive keratectomy; LASIK laser assisted in situ keratomileusis; HTN hypertension; DM diabetes mellitus; COPD chronic obstructive pulmonary disease

## 2020-10-20 ENCOUNTER — Encounter (INDEPENDENT_AMBULATORY_CARE_PROVIDER_SITE_OTHER): Payer: Self-pay | Admitting: Ophthalmology

## 2020-11-02 NOTE — Progress Notes (Signed)
Office Visit Note  Patient: [name]             Date of Birth: 1945-04-19           MRN: [id number]             PCP: Andi Devon, MD Referring: Andi Devon, MD Visit Date: 11/16/2020 Occupation: @GUAROCC @  Subjective:  Left shoulder and neck pain.   History of Present Illness: is a 75 y.o. 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  She states she has been struggling with elevated blood sugar and her dosage for adjusted by her PCP.  She was also taken off the blood pressure medication for some reason she is not sure but her blood pressure has been more elevated.  She has noticed increased fluid retention since she has been off her blood pressure medication.  She has been experiencing discomfort in her neck and her left shoulder joint.  She is concerned that she may have another ruptured disc in her cervical spine.  She denies any joint swelling.  She has been taking Enbrel weekly without any side effects.  I spoke to mention about few things about Enbrel  Activities of Daily Living:  Patient reports morning stiffness for several hours.   Patient Reports nocturnal pain.  Difficulty dressing/grooming: Reports Difficulty climbing stairs: Reports Difficulty getting out of chair: Reports Difficulty using hands for taps, buttons, cutlery, and/or writing: Reports  Review of Systems  Constitutional:  Positive for fatigue.  HENT:  Positive for mouth sores, mouth dryness and nose dryness.   Eyes:  Positive for dryness. Negative for pain and itching.  Respiratory:  Positive for shortness of breath. Negative for difficulty breathing.   Cardiovascular:  Negative for chest pain and palpitations.  Gastrointestinal:  Negative for blood in stool, constipation and diarrhea.  Endocrine: Negative for increased urination.  Genitourinary:  Positive for difficulty  urinating.  Musculoskeletal:  Positive for joint pain, joint pain, joint swelling, myalgias, morning stiffness, muscle tenderness and myalgias.  Skin:  Negative for color change, rash and redness.  Allergic/Immunologic: Positive for susceptible to infections.  Neurological:  Positive for numbness, headaches, memory loss and weakness. Negative for dizziness.  Hematological:  Positive for bruising/bleeding tendency.  Psychiatric/Behavioral:  Negative for confusion.    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   Gastroesophageal reflux disease without esophagitis 11/27/2019   Generalized anxiety disorder 11/27/2019   Hypothyroidism 11/27/2019   Smoker 11/27/2019   Type 2 diabetes mellitus without complication (HCC) 11/27/2019   Varicose veins of lower extremity 11/27/2019   Vitamin D deficiency 11/27/2019   Arthritis of hand 11/05/2019   Inflammation of joint of both hands 11/05/2019   Encounter for orthopedic follow-up care 01/09/2019   Tenosynovitis of fingers 09/22/2018   Tenosynovitis of right wrist 09/22/2018   Pain in right hand 09/08/2018   Nonintractable headache 08/16/2016   Carotid artery tenderness 10/26/2015   Leg pain, left 07/27/2014   Back pain of thoracolumbar region 08/11/2012   Renal cyst 08/04/2012   Cervical spondylosis with radiculopathy 01/24/2012   Chronic interstitial cystitis 11/01/2011   Mixed incontinence 11/01/2011   EMPHYSEMA 08/05/2008   LEUKOCYTOSIS 06/25/2008   COPD 06/25/2008   GASTROPARESIS 06/25/2008   DM 06/14/2008   HYPERLIPIDEMIA 06/14/2008   Essential hypertension 06/14/2008   ASTHMATIC BRONCHITIS, ACUTE 06/14/2008  ALLERGIC RHINITIS 06/14/2008   LUPUS 06/14/2008    Past Medical History:  Diagnosis Date   Arthritis    "little bit qwhere" (01/24/2012)   Asthma    Chronic back pain    "neck to tailbone" (01/24/2012)   Chronic bronchitis (HCC)     "last time ~ 1 month ago" (01/24/2012)   COPD (chronic obstructive pulmonary disease) (HCC)    Depression    takes Zoloft daily   Fibromyalgia    History of bladder infections    sees a urologist--on long term Macrodantin nightly   History of blood transfusion    as a child   History of blood transfusion    "when I was a child" (01/24/2012)   Hyperlipidemia    takes Simvastatin daily   Hypertension    takes Maxzide daily   Hypothyroidism    takes Synthroid daily   Insomnia    takes Ambien nightly   Lupus (HCC)    Neck pain    herniated disc and radiculopathy   Peripheral neuropathy    Pneumonia 2012   "have had it a couple times" (01/24/2012)   Smokers' cough (HCC)    has albuterol prn   Type II diabetes mellitus (HCC)    takes Janumet daily   Urinary frequency     Family History  Problem Relation Age of Onset   Heart disease Mother    Arrhythmia Father    Diabetes Sister    Heart disease Sister    Blindness Brother    Heart attack Brother    Diabetes Brother    Past Surgical History:  Procedure Laterality Date   ANTERIOR CERVICAL DECOMP/DISCECTOMY FUSION  2002; 01/24/2012   ANTERIOR CERVICAL DECOMP/DISCECTOMY FUSION  01/24/2012   Procedure: ANTERIOR CERVICAL DECOMPRESSION/DISCECTOMY FUSION 1 LEVEL;  Surgeon: Cristi Loron, MD;  Location: [facility];  Service: Neurosurgery;  Laterality: N/A;  CERVICAL THREE-FOUR anterior cervical decompression with fusion interbody prothesis plating and bonegraft   APPENDECTOMY  11/04/1958   BACK SURGERY  03/06/1999   BLADDER SURGERY  03/06/1975   through abdomen   BLADDER SUSPENSION  03/05/1972   BUNIONECTOMY  11/04/1978   left   CARDIAC CATHETERIZATION  03/05/2006   CATARACT EXTRACTION     COLONOSCOPY     ESOPHAGOGASTRODUODENOSCOPY     LUMBAR FUSION  03/06/2007   RECTOCELE REPAIR  03/05/1998   VAGINAL HYSTERECTOMY  03/05/1972   Social History   Social History Narrative   Not on file   Immunization History   Administered Date(s) Administered   Influenza Split 01/25/2012   Influenza, High Dose Seasonal PF 01/01/2017   Influenza-Unspecified 12/03/2012   Pneumococcal Conjugate-13 12/17/2017   Pneumococcal Polysaccharide-23 01/25/2012   Tdap 04/14/2014     Objective: Vital Signs: BP (!) 196/76 (BP Location: Left Arm, Patient Position: Sitting, Cuff Size: Normal)   Pulse 69   Ht 5\' 5"  (1.651 m)   Wt 147 lb 3.2 oz (66.8 kg)   BMI 24.50 kg/m    Physical Exam Vitals and nursing note reviewed.  Constitutional:      Appearance: She is well-developed.  HENT:     Head: Normocephalic and atraumatic.  Eyes:     Conjunctiva/sclera: Conjunctivae normal.  Cardiovascular:     Rate and Rhythm: Normal rate and regular rhythm.     Heart sounds: Normal heart sounds.  Pulmonary:     Effort: Pulmonary effort is normal.     Breath sounds: Normal breath sounds.  Abdominal:     General:  Bowel sounds are normal.     Palpations: Abdomen is soft.  Musculoskeletal:     Cervical back: Normal range of motion.  Lymphadenopathy:     Cervical: No cervical adenopathy.  Skin:    General: Skin is warm and dry.     Capillary Refill: Capillary refill takes less than 2 seconds.  Neurological:     Mental Status: She is alert and oriented to person, place, and time.  Psychiatric:        Behavior: Behavior normal.     Musculoskeletal Exam: Patient had very limited painful range of motion for cervical spine.  Shoulder joints, elbow joints, wrist joints, MCPs with good range of motion with no synovitis.  She had bilateral PIP and DIP thickening.  Hip joints and knee joints with good range of motion.  There was no tenderness over ankles or MTPs.  Mild edema was noted over the left lower extremity.  CDAI Exam: CDAI Score: 0.6  Patient Global: 4 mm; Provider Global: 2 mm Swollen: 0 ; Tender: 2  Joint Exam 11/16/2020      Right  Left  Cervical Spine   Tender     Lumbar Spine   Tender         Investigation: No additional findings.  Imaging: OCT, Retina - OU - Both Eyes  Result Date: 10/20/2020 Right Eye Quality was good. Central Foveal Thickness: 287. Progression has no prior data. Findings include normal foveal contour, no IRF, no SRF, retinal drusen . Left Eye Quality was good. Central Foveal Thickness: 282. Progression has no prior data. Findings include normal foveal contour, no IRF, no SRF, retinal drusen , subretinal hyper-reflective material (Mild central SRHM / vitelliform like lesion, no DME). Notes *Images captured and stored on drive Diagnosis / Impression: NFP; no IRF/SRF OU No DME OU OS: Mild central SRHM / vitelliform like lesion, no DME Clinical management: See below Abbreviations: NFP - Normal foveal profile. CME - cystoid macular edema. PED - pigment epithelial detachment. IRF - intraretinal fluid. SRF - subretinal fluid. EZ - ellipsoid zone. ERM - epiretinal membrane. ORA - outer retinal atrophy. ORT - outer retinal tubulation. SRHM - subretinal hyper-reflective material. IRHM - intraretinal hyper-reflective material    Recent Labs: Lab Results  Component Value Date   WBC 15.3 (H) 09/17/2020   HGB 13.8 09/17/2020   PLT 334 09/17/2020   NA 134 (L) 09/17/2020   K 4.2 09/17/2020   CL 99 09/17/2020   CO2 25 09/17/2020   GLUCOSE 61 (L) 09/17/2020   BUN 23 09/17/2020   CREATININE 1.09 (H) 09/17/2020   BILITOT 0.5 09/17/2020   ALKPHOS 71 09/17/2020   AST 16 09/17/2020   ALT 13 09/17/2020   PROT 7.9 09/17/2020   ALBUMIN 3.9 09/17/2020   CALCIUM 9.2 09/17/2020   GFRAA 52 (L) 06/16/2020   QFTBGOLDPLUS NEGATIVE 06/16/2020    Speciality Comments: No specialty comments available.  Procedures:  No procedures performed Allergies: Erythromycin, Penicillins, Gadolinium derivatives, Iodinated diagnostic agents, Latex, Morphine, Other, Prednisone, Sulfonamide derivatives, Varenicline tartrate, Atorvastatin, and Aspirin   Assessment / Plan:     Visit  Diagnoses: Seronegative rheumatoid arthritis (HCC)-patient initially presented with severe inflammatory arthritis and tenosynovitis.  She had very good response to Enbrel.  She had no synovitis on my examination today.  High risk medication use - Enbrel 50 mg sq injections once weekly-started on 11/05/19.  D/c MTX 0.4 ml sq wkly inj-low dose- elevated creatinine and recurrent bronchitis.  Her labs from September 17, 2020  were stable.  Her GFR has been low in the 50s.  She was also on diuretics then.  She has been advised to stop Enbrel in case she develops an infection restarts once infection resolves.  Information regarding realization was placed in the AVS.  She was advised to get an annual skin examination to screen for nonmelanoma skin cancer while she is on Enbrel.  Tenosynovitis of fingers - Resolved   Tenosynovitis of right wrist - Resolved   Discoid lupus-she denies having any rash.  DDD (degenerative disc disease), cervical-she has severe neck pain and limited range of motion.  She has been followed by Dr. Lovell SheehanJenkins.  She will schedule an appointment with Dr. Lovell SheehanJenkins.  DDD (degenerative disc disease), lumbar-she has ongoing pain and discomfort in her lower back.  She will schedule appointment with Dr. Lovell SheehanJenkins.  Fibromyalgia-she continues to have some generalized pain and discomfort.  History of type 2 diabetes mellitus-she states her blood sugar has been running between 500-600.  Medications have been adjusted by her PCP.  History of diabetic gastroparesis  Essential hypertension-her systolic blood pressure was 196 today.  She will contact her PCP.  She has been under a lot of stress.  Stress relieving techniques were discussed.  History of hyperlipidemia  History of COPD  History of asthma  Muscle cramping-she continues to have muscle cramps.  She states that she was not satisfied with her visit with previous neurologist.  She will consider neurology referral after her blood pressure  and blood sugar normalizes.  History of hypothyroidism  History of depression-she is under a lot of stress as her son has been diagnosed with lung cancer and now he has pneumonia.  Her husband has melanoma.  Smoker  Orders: No orders of the defined types were placed in this encounter.  No orders of the defined types were placed in this encounter.    Follow-Up Instructions: Return for Rheumatoid arthritis.   Pollyann SavoyShaili Kristoph Sattler, MD  Note - This record has been created using Animal [employer]istDragon software.  Chart creation errors have been sought, but may not always  have been located. Such creation errors do not reflect on  the standard of medical care.

## 2020-11-16 ENCOUNTER — Other Ambulatory Visit: Payer: Self-pay

## 2020-11-16 ENCOUNTER — Encounter: Payer: Self-pay | Admitting: Rheumatology

## 2020-11-16 ENCOUNTER — Ambulatory Visit: Payer: Medicare Other | Admitting: Rheumatology

## 2020-11-16 VITALS — BP 196/76 | HR 69 | Ht 65.0 in | Wt 147.2 lb

## 2020-11-16 DIAGNOSIS — M659 Synovitis and tenosynovitis, unspecified: Secondary | ICD-10-CM | POA: Diagnosis not present

## 2020-11-16 DIAGNOSIS — M0600 Rheumatoid arthritis without rheumatoid factor, unspecified site: Secondary | ICD-10-CM

## 2020-11-16 DIAGNOSIS — M5030 Other cervical disc degeneration, unspecified cervical region: Secondary | ICD-10-CM

## 2020-11-16 DIAGNOSIS — Z8709 Personal history of other diseases of the respiratory system: Secondary | ICD-10-CM

## 2020-11-16 DIAGNOSIS — F17200 Nicotine dependence, unspecified, uncomplicated: Secondary | ICD-10-CM

## 2020-11-16 DIAGNOSIS — M5136 Other intervertebral disc degeneration, lumbar region: Secondary | ICD-10-CM

## 2020-11-16 DIAGNOSIS — L930 Discoid lupus erythematosus: Secondary | ICD-10-CM

## 2020-11-16 DIAGNOSIS — M797 Fibromyalgia: Secondary | ICD-10-CM

## 2020-11-16 DIAGNOSIS — Z8659 Personal history of other mental and behavioral disorders: Secondary | ICD-10-CM

## 2020-11-16 DIAGNOSIS — M51369 Other intervertebral disc degeneration, lumbar region without mention of lumbar back pain or lower extremity pain: Secondary | ICD-10-CM

## 2020-11-16 DIAGNOSIS — Z79899 Other long term (current) drug therapy: Secondary | ICD-10-CM | POA: Diagnosis not present

## 2020-11-16 DIAGNOSIS — B370 Candidal stomatitis: Secondary | ICD-10-CM

## 2020-11-16 DIAGNOSIS — Z8639 Personal history of other endocrine, nutritional and metabolic disease: Secondary | ICD-10-CM

## 2020-11-16 DIAGNOSIS — I10 Essential (primary) hypertension: Secondary | ICD-10-CM

## 2020-11-16 DIAGNOSIS — M62838 Other muscle spasm: Secondary | ICD-10-CM

## 2020-11-16 DIAGNOSIS — M65931 Unspecified synovitis and tenosynovitis, right forearm: Secondary | ICD-10-CM

## 2020-11-16 DIAGNOSIS — M65949 Unspecified synovitis and tenosynovitis, unspecified hand: Secondary | ICD-10-CM

## 2020-11-16 DIAGNOSIS — R252 Cramp and spasm: Secondary | ICD-10-CM

## 2020-11-16 NOTE — Patient Instructions (Addendum)
Standing Labs We placed an order today for your standing lab work.   Please have your standing labs drawn in October and every 3 months  If possible, please have your labs drawn 2 weeks prior to your appointment so that the provider can discuss your results at your appointment.  Please note that you may see your imaging and lab results in MyChart before we have reviewed them. We may be awaiting multiple results to interpret others before contacting you. Please allow our office up to 72 hours to thoroughly review all of the results before contacting the office for clarification of your results.  We have open lab daily: Monday through Thursday from 1:30-4:30 PM and Friday from 1:30-4:00 PM at the office of Dr. Pollyann Savoy, [facility].   Please be advised, all patients with office appointments requiring lab work will take precedent over walk-in lab work.  If possible, please come for your lab work on Monday and Friday afternoons, as you may experience shorter wait times. The office is located at [address], [address], [place], [name] [address] No appointment is necessary.   Labs are drawn by [facility]. Please bring your co-pay at the time of your lab draw.  You may receive a bill from [facility] for your lab work.  If you wish to have your labs drawn at another location, please call the office 24 hours in advance to send orders.  If you have any questions regarding directions or hours of operation,  please call [phone].   As a reminder, please drink plenty of water prior to coming for your lab work. Thanks!   Vaccines You are taking a medication(s) that can suppress your immune system.  The following immunizations are recommended: Flu annually Covid-19  Td/Tdap (tetanus, diphtheria, pertussis) every 10 years Pneumonia (Prevnar 15 then Pneumovax 23 at least 1 year apart.  Alternatively, can take Prevnar 20 without needing additional dose) Shingrix: 2 doses from 4 weeks  to 6 months apart  Please check with your PCP to make sure you are up to date.   If you test POSITIVE for COVID19 and have MILD to MODERATE symptoms: First, call your PCP if you would like to receive COVID19 treatment AND Hold your medications during the infection and for at least 1 week after your symptoms have resolved: Injectable medication (Benlysta, Cimzia, Cosentyx, Enbrel, Humira, Orencia, Remicade, Simponi, Stelara, Taltz, Tremfya) Methotrexate Leflunomide (Arava) Azathioprine Mycophenolate (Cellcept) Osborne [place]man, or Rinvoq Otezla If you take Actemra or Kevzara, you DO NOT need to hold these for COVID19 infection.  If you test POSITIVE for COVID19 and have NO symptoms: First, call your PCP if you would like to receive COVID19 treatment AND Hold your medications for at least 10 days after the day that you tested positive Injectable medication (Benlysta, Cimzia, Cosentyx, Enbrel, Humira, Orencia, Remicade, Simponi, Stelara, Taltz, Tremfya) Methotrexate Leflunomide (Arava) Azathioprine Mycophenolate (Cellcept) Osborne [place], or Rinvoq Otezla If you take Actemra or Kevzara, you DO NOT need to hold these for COVID19 infection.  If you have signs or symptoms of an infection or start antibiotics: First, call your PCP for workup of your infection. Hold your medication through the infection, until you complete your antibiotics, and until symptoms resolve if you take the following: Injectable medication (Actemra, Benlysta, Cimzia, Cosentyx, Enbrel, Humira, Kevzara, Orencia, Remicade, Simponi, Stelara, Taltz, Tremfya) Methotrexate Leflunomide (Arava) Mycophenolate (Cellcept) Osborne [place], or Rinvoq  Please get annual skin examination by your dermatologist to screen for nonmelanoma skin cancer while  you are taking Enbrel Heart Disease Prevention   Your inflammatory disease increases your risk of heart disease which includes heart attack, stroke, atrial fibrillation  (irregular heartbeats), high blood pressure, heart failure and atherosclerosis (plaque in the arteries).  It is important to reduce your risk by:   Keep blood pressure, cholesterol, and blood sugar at healthy levels   Smoking Cessation   Maintain a healthy weight  BMI 20-25   Eat a healthy diet  Plenty of fresh fruit, vegetables, and whole grains  Limit saturated fats, foods high in sodium, and added sugars  DASH and Mediterranean diet   Increase physical activity  Recommend moderate physically activity for 150 minutes per week/ 30 minutes a day for five days a week These can be broken up into three separate ten-minute sessions during the day.   Reduce Stress  Meditation, slow breathing exercises, yoga, coloring books  Dental visits twice a year

## 2020-12-28 ENCOUNTER — Telehealth: Payer: Self-pay | Admitting: Pharmacist

## 2020-12-28 NOTE — Telephone Encounter (Signed)
Patient dropped off renewal application from [employer] for ENBREL patient assistance. Signed patient portion, med list, insurance card copy, and PA approval letter collected.   Pending provider signature - placed in folder to be signed   Louise Rawson, PharmD, MPH, BCPS Clinical Pharmacist (Rheumatology and Pulmonology) 

## 2020-12-29 NOTE — Telephone Encounter (Signed)
Signed provider portion received.   Submitted Patient Assistance RENEWAL Application to [employer] for ENBREL along with provider portion, patient portion, med list, insurance card copy.  Will update patient when we receive a response.   Fax# [phone] Phone# [phone]  Adryan Druckenmiller, PharmD, MPH, BCPS Clinical Pharmacist (Rheumatology and Pulmonology) 

## 2021-01-04 NOTE — Telephone Encounter (Signed)
Received a fax from  [employer] regarding an approval for ENBREL patient assistance from 01/03/21 to 03/04/22.   Phone number: [phone]  Alynn Ellithorpe, PharmD, MPH, BCPS Clinical Pharmacist (Rheumatology and Pulmonology)  

## 2021-01-17 NOTE — Progress Notes (Signed)
[facility] - Clinic Note  01/18/2021     CHIEF COMPLAINT Patient presents for Retina Follow Up   HISTORY OF PRESENT ILLNESS: [name] is a 75 y.o. female who presents to the clinic today for:   HPI     Retina Follow Up   Patient presents with  Wet AMD.  In both eyes.  I, the attending physician,  performed the HPI with the patient and updated documentation appropriately.        Comments   3 month follow up Exu ARMD OU-  Vision fluctuates, today OD is cloudy.   BS 98 this morning, A1C unsure      Last edited by Rennis Chris, MD on 01/19/2021 11:42 PM.    She states vision has been okay,  right eye seems "foggy" on occasion   Referring physician: Andi Devon, MD [address] [place],  [name] [address]  HISTORICAL INFORMATION:   Selected notes from the MEDICAL RECORD NUMBER Self referral for DEE -- pt of Dr. Dione Booze [name]:  Ocular Hx- PMH-    CURRENT MEDICATIONS: No current outpatient medications on file. (Ophthalmic Drugs)   No current facility-administered medications for this visit. (Ophthalmic Drugs)   Current Outpatient Medications (Other)  Medication Sig   albuterol (VENTOLIN HFA) 108 (90 Base) MCG/ACT inhaler as needed.   BREZTRI AEROSPHERE 160-9-4.8 MCG/ACT AERO 2 (two) times daily.   cyclobenzaprine (FLEXERIL) 10 MG tablet Take 10 mg by mouth 2 (two) times daily as needed.   Etanercept (ENBREL MINI) 50 MG/ML SOCT Inject 50 mg into the skin once a week.   insulin glargine, 1 Unit Dial, (TOUJEO SOLOSTAR) 300 UNIT/ML Solostar Pen 35 units in the morning and 50 units at bedtime.   levothyroxine (SYNTHROID, LEVOTHROID) 50 MCG tablet Take 50 mcg by mouth daily before breakfast.   MAGNESIUM PO Take by mouth daily.   metFORMIN (GLUCOPHAGE) 1000 MG tablet Take 1,000 mg by mouth 2 (two) times daily with a meal.   nitroGLYCERIN (NITROSTAT) 0.4 MG SL tablet Place 1 tablet (0.4 mg total) under the tongue every 5  (five) minutes x 3 doses as needed for chest pain. If no relief after 3rd dose, proceed to the ED for an evaluation   sertraline (ZOLOFT) 100 MG tablet Take 100 mg by mouth daily.    VITAMIN D PO Take by mouth daily.   zolpidem (AMBIEN) 5 MG tablet Take 5 mg by mouth at bedtime. For sleep   metoCLOPramide (REGLAN) 5 MG tablet Take 5 mg by mouth daily.  (Patient not taking: Reported on 11/16/2020)   ONETOUCH VERIO test strip 1 each 4 (four) times daily.   triamterene-hydrochlorothiazide (MAXZIDE-25) 37.5-25 MG tablet Take 1 tablet by mouth daily. (Patient not taking: No sig reported)   No current facility-administered medications for this visit. (Other)   REVIEW OF SYSTEMS: ROS   Positive for: Musculoskeletal, Endocrine, Cardiovascular, Eyes, Respiratory, Psychiatric Negative for: Constitutional, Gastrointestinal, Neurological, Skin, Genitourinary, HENT, Allergic/Imm, Heme/Lymph Last edited by Joni Reining, COA on 01/18/2021  1:13 PM.     ALLERGIES Allergies  Allergen Reactions   Erythromycin Anaphylaxis   Penicillins Anaphylaxis   Gadolinium Derivatives Hives and Rash    Pt broke out in hives all over body.  Has IV steroids and Benadryl in hospital for MRI.   Iodinated Diagnostic Agents Hives    13-hour prep Pt had slight itching of her abd after her injection and had taken her 13 hr prep.  Consult with  radiologist before another procedure to see if IV contrast is necessary, per Dr Roswell Nickel on 01/06/14, JB/   Latex Dermatitis and Other (See Comments)    blisters   Morphine Nausea And Vomiting   Other Itching and Other (See Comments)    Chlorhexin (CHG); "burning"   Prednisone Other (See Comments)    "shakes me out of my frame" IM steroids aren't as bad as PO   Sulfonamide Derivatives Itching and Rash    All over body    Varenicline Tartrate Nausea And Vomiting        Atorvastatin    Aspirin Nausea Only and Other (See Comments)    Burning in stomach     PAST MEDICAL  HISTORY Past Medical History:  Diagnosis Date   Arthritis    "little bit qwhere" (01/24/2012)   Asthma    Chronic back pain    "neck to tailbone" (01/24/2012)   Chronic bronchitis (HCC)    "last time ~ 1 month ago" (01/24/2012)   COPD (chronic obstructive pulmonary disease) (HCC)    Depression    takes Zoloft daily   Fibromyalgia    History of bladder infections    sees a urologist--on long term Macrodantin nightly   History of blood transfusion    as a child   History of blood transfusion    "when I was a child" (01/24/2012)   Hyperlipidemia    takes Simvastatin daily   Hypertension    takes Maxzide daily   Hypothyroidism    takes Synthroid daily   Insomnia    takes Ambien nightly   Lupus (HCC)    Neck pain    herniated disc and radiculopathy   Peripheral neuropathy    Pneumonia 2012   "have had it a couple times" (01/24/2012)   Smokers' cough (HCC)    has albuterol prn   Type II diabetes mellitus (HCC)    takes Janumet daily   Urinary frequency    Past Surgical History:  Procedure Laterality Date   ANTERIOR CERVICAL DECOMP/DISCECTOMY FUSION  2002; 01/24/2012   ANTERIOR CERVICAL DECOMP/DISCECTOMY FUSION  01/24/2012   Procedure: ANTERIOR CERVICAL DECOMPRESSION/DISCECTOMY FUSION 1 LEVEL;  Surgeon: Cristi Loron, MD;  Location: [facility];  Service: Neurosurgery;  Laterality: N/A;  CERVICAL THREE-FOUR anterior cervical decompression with fusion interbody prothesis plating and bonegraft   APPENDECTOMY  11/04/1958   BACK SURGERY  03/06/1999   BLADDER SURGERY  03/06/1975   through abdomen   BLADDER SUSPENSION  03/05/1972   BUNIONECTOMY  11/04/1978   left   CARDIAC CATHETERIZATION  03/05/2006   CATARACT EXTRACTION     COLONOSCOPY     ESOPHAGOGASTRODUODENOSCOPY     LUMBAR FUSION  03/06/2007   RECTOCELE REPAIR  03/05/1998   VAGINAL HYSTERECTOMY  03/05/1972    FAMILY HISTORY Family History  Problem Relation Age of Onset   Heart disease Mother     Arrhythmia Father    Diabetes Sister    Heart disease Sister    Blindness Brother    Heart attack Brother    Diabetes Brother     SOCIAL HISTORY Social History   Tobacco Use   Smoking status: Every Day    Packs/day: 1.00    Years: 60.00    Pack years: 60.00    Types: Cigarettes   Smokeless tobacco: Never  Vaping Use   Vaping Use: Former  Substance Use Topics   Alcohol use: No    Alcohol/week: 0.0 standard drinks   Drug use: No  OPHTHALMIC EXAM: Base Eye Exam     Visual Acuity (Snellen - Linear)       Right Left   Dist [place] 20/30 +1 20/25 -2   Dist ph  NI NI         Tonometry (Tonopen, 1:21 PM)       Right Left   Pressure 12 14         Pupils       Dark Light Shape React APD   Right 2 1 Round Minimal None   Left 2 1 Round Minimal None         Visual Fields (Counting fingers)       Left Right    Full Full         Extraocular Movement       Right Left    Full Full         Neuro/Psych     Oriented x3: Yes   Mood/Affect: Normal         Dilation     Both eyes: 1.0% Mydriacyl, 2.5% Phenylephrine @ 1:21 PM           Slit Lamp and Fundus Exam     Slit Lamp Exam       Right Left   Lids/Lashes Dermatochalasis - upper lid Dermatochalasis - upper lid   Conjunctiva/Sclera White and quiet White and quiet   Cornea arcus, trace PEE, mild central haze, well healed cataract wound arcus, 1+ infeiror PEE, mild central haze, well healed cataract wound   Anterior Chamber deep and clear, no cell or flare deep and clear, no cell or flare   Iris Round and dilated Round and dilated   Lens PC IOL in good position PC IOL in good position   Anterior Vitreous Vitreous syneresis, no cell or pigment Vitreous syneresis, no cell or pigment         Fundus Exam       Right Left   Disc mild Pallor, Sharp rim mild Pallor, Sharp rim, +SVP   C/D Ratio 0.3 0.4   Macula Flat, Good foveal reflex, Drusen, mild RPE mottling, No heme or edema Flat,  Good foveal reflex, Drusen, RPE mottling and clumping, central vitelliform like lesion, No heme or edema   Vessels attenuated, Tortuous attenuated, Tortuous, mild Copper wiring   Periphery Attached, No heme  Attached, No heme             IMAGING AND PROCEDURES  Imaging and Procedures for 01/18/2021  OCT, Retina - OU - Both Eyes       Right Eye Quality was good. Central Foveal Thickness: 287. Progression has been stable. Findings include normal foveal contour, no IRF, no SRF, retinal drusen .   Left Eye Quality was good. Central Foveal Thickness: 284. Progression has been stable. Findings include normal foveal contour, no IRF, no SRF, retinal drusen , subretinal hyper-reflective material (Mild central SRHM / vitelliform like lesion, no DME).   Notes *Images captured and stored on drive  Diagnosis / Impression:  NFP; no IRF/SRF OU No DME OU OS: Mild central SRHM / vitelliform like lesion, no DME   Clinical management:  See below  Abbreviations: NFP - Normal foveal profile. CME - cystoid macular edema. PED - pigment epithelial detachment. IRF - intraretinal fluid. SRF - subretinal fluid. EZ - ellipsoid zone. ERM - epiretinal membrane. ORA - outer retinal atrophy. ORT - outer retinal tubulation. SRHM - subretinal hyper-reflective material. IRHM - intraretinal hyper-reflective material  ASSESSMENT/PLAN:    ICD-10-CM   1. Diabetes mellitus type 2 without retinopathy (HCC)  E11.9     2. Retinal edema  H35.81 OCT, Retina - OU - Both Eyes    3. Intermediate stage nonexudative age-related macular degeneration of both eyes  H35.3132     4. Essential hypertension  I10     5. Hypertensive retinopathy of both eyes  H35.033     6. Pseudophakia of both eyes  Z96.1      1,2. Diabetes mellitus, type 2 without retinopathy  - exam with no MA or heme OU - The incidence, risk factors for progression, natural history and treatment options for diabetic retinopathy  were  discussed with patient.   - The need for close monitoring of blood glucose, blood pressure, and serum lipids, avoiding cigarette or any type of tobacco, and the need for long term follow up was also discussed with patient. - f/u in 1 year, sooner PRN  3. Age related macular degeneration, non-exudative, both eyes  - intermediate stage with near confluent drusen and early atrophy  - no exudative disease on exam or OCT  - discussed ARMD as the most likely cause of visual symptoms  - The incidence, anatomy, and pathology of dry AMD, risk of progression, and the AREDS and AREDS 2 study including smoking risks discussed with patient.  - Recommend amsler grid monitoring  - f/u in 6 mos  4,5. Hypertensive retinopathy OU - discussed importance of tight BP control - monitor  6. Pseudophakia OU  - s/p CE/IOL OU (R. Groat)  - IOLs in good position, doing well  - monitor   Ophthalmic Meds Ordered this visit:  No orders of the defined types were placed in this encounter.    Return in about 6 months (around 07/18/2021) for f/u DM / non-exu ARMD OU, DFE, OCT.  There are no Patient Instructions on file for this visit.   Explained the diagnoses, plan, and follow up with the patient and they expressed understanding.  Patient expressed understanding of the importance of proper follow up care.   This document serves as a record of services personally performed by Karie Chimera, MD, PhD. It was created on their behalf by De Blanch, an ophthalmic technician. The creation of this record is the provider's dictation and/or activities during the visit.    Electronically signed by: De Blanch, OA, 01/19/21  11:44 PM   This document serves as a record of services personally performed by Karie Chimera, MD, PhD. It was created on their behalf by Glee Arvin. Manson Passey, OA an ophthalmic technician. The creation of this record is the provider's dictation and/or activities during the visit.     Electronically signed by: Glee Arvin. Manson Passey, [place] 11.16.2022 11:44 PM  Karie Chimera, M.D., Ph.D. Diseases & Surgery of the Retina and Vitreous [facility]  I have reviewed the above documentation for accuracy and completeness, and I agree with the above. Karie Chimera, M.D., Ph.D. 01/19/21 11:47 PM   Abbreviations: M myopia (nearsighted); A astigmatism; H hyperopia (farsighted); P presbyopia; Mrx spectacle prescription;  CTL contact lenses; OD right eye; OS left eye; OU both eyes  XT exotropia; ET esotropia; PEK punctate epithelial keratitis; PEE punctate epithelial erosions; DES dry eye syndrome; MGD meibomian gland dysfunction; ATs artificial tears; PFAT's preservative free artificial tears; NSC nuclear sclerotic cataract; PSC posterior subcapsular cataract; ERM epi-retinal membrane; PVD posterior vitreous detachment; RD retinal detachment; DM diabetes mellitus; DR diabetic  retinopathy; NPDR non-proliferative diabetic retinopathy; PDR proliferative diabetic retinopathy; CSME clinically significant macular edema; DME diabetic macular edema; dbh dot blot hemorrhages; CWS cotton wool spot; POAG primary open angle glaucoma; C/D cup-to-disc ratio; HVF humphrey visual field; GVF goldmann visual field; OCT optical coherence tomography; IOP intraocular pressure; BRVO Branch retinal vein occlusion; CRVO central retinal vein occlusion; CRAO central retinal artery occlusion; BRAO branch retinal artery occlusion; RT retinal tear; SB scleral buckle; PPV pars plana vitrectomy; VH Vitreous hemorrhage; PRP panretinal laser photocoagulation; IVK intravitreal kenalog; VMT vitreomacular traction; MH Macular hole;  NVD neovascularization of the disc; NVE neovascularization elsewhere; AREDS age related eye disease study; ARMD age related macular degeneration; POAG primary open angle glaucoma; EBMD epithelial/anterior basement membrane dystrophy; ACIOL anterior chamber intraocular lens; IOL  intraocular lens; PCIOL posterior chamber intraocular lens; Phaco/IOL phacoemulsification with intraocular lens placement; PRK photorefractive keratectomy; LASIK laser assisted in situ keratomileusis; HTN hypertension; DM diabetes mellitus; COPD chronic obstructive pulmonary disease

## 2021-01-18 ENCOUNTER — Other Ambulatory Visit: Payer: Self-pay

## 2021-01-18 ENCOUNTER — Ambulatory Visit (INDEPENDENT_AMBULATORY_CARE_PROVIDER_SITE_OTHER): Payer: Medicare Other | Admitting: Ophthalmology

## 2021-01-18 ENCOUNTER — Encounter (INDEPENDENT_AMBULATORY_CARE_PROVIDER_SITE_OTHER): Payer: Self-pay | Admitting: Ophthalmology

## 2021-01-18 DIAGNOSIS — Z961 Presence of intraocular lens: Secondary | ICD-10-CM | POA: Diagnosis not present

## 2021-01-18 DIAGNOSIS — H353132 Nonexudative age-related macular degeneration, bilateral, intermediate dry stage: Secondary | ICD-10-CM | POA: Diagnosis not present

## 2021-01-18 DIAGNOSIS — H35033 Hypertensive retinopathy, bilateral: Secondary | ICD-10-CM

## 2021-01-18 DIAGNOSIS — I10 Essential (primary) hypertension: Secondary | ICD-10-CM

## 2021-01-18 DIAGNOSIS — E119 Type 2 diabetes mellitus without complications: Secondary | ICD-10-CM

## 2021-01-18 DIAGNOSIS — H3581 Retinal edema: Secondary | ICD-10-CM

## 2021-01-19 ENCOUNTER — Encounter (INDEPENDENT_AMBULATORY_CARE_PROVIDER_SITE_OTHER): Payer: Self-pay | Admitting: Ophthalmology

## 2021-02-01 NOTE — Progress Notes (Deleted)
Office Visit Note  Patient: [name]             Date of Birth: 1945/07/05           MRN: [id number]             PCP: Willey Blade, MD Referring: Willey Blade, MD Visit Date: 02/15/2021 Occupation: @GUAROCC @  Subjective:  No chief complaint on file.   History of Present Illness: [name] is a 75 y.o. female ***   Activities of Daily Living:  Patient reports morning stiffness for *** {minute/hour:19697}.   Patient {ACTIONS;DENIES/REPORTS:21021675::"Denies"} nocturnal pain.  Difficulty dressing/grooming: {ACTIONS;DENIES/REPORTS:21021675::"Denies"} Difficulty climbing stairs: {ACTIONS;DENIES/REPORTS:21021675::"Denies"} Difficulty getting out of chair: {ACTIONS;DENIES/REPORTS:21021675::"Denies"} Difficulty using hands for taps, buttons, cutlery, and/or writing: {ACTIONS;DENIES/REPORTS:21021675::"Denies"}  No Rheumatology ROS completed.   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   Gastroesophageal reflux disease without esophagitis 11/27/2019   Generalized anxiety disorder 11/27/2019   Hypothyroidism 11/27/2019   Smoker 11/27/2019   Type 2 diabetes mellitus without complication ([place]) 0000000   Varicose veins of lower extremity 11/27/2019   Vitamin D deficiency 11/27/2019   Arthritis of hand 11/05/2019   Inflammation of joint of both hands 11/05/2019   Encounter for orthopedic follow-up care 01/09/2019   Tenosynovitis of fingers 09/22/2018   Tenosynovitis of right wrist 09/22/2018   Pain in right hand 09/08/2018   Nonintractable headache 08/16/2016   Carotid artery tenderness 10/26/2015   Leg pain, left 07/27/2014   Back pain of thoracolumbar region 08/11/2012   Renal cyst 08/04/2012   Cervical spondylosis with radiculopathy 01/24/2012   Chronic interstitial cystitis 11/01/2011   Mixed incontinence 11/01/2011   EMPHYSEMA 08/05/2008   LEUKOCYTOSIS  06/25/2008   COPD 06/25/2008   GASTROPARESIS 06/25/2008   DM 06/14/2008   HYPERLIPIDEMIA 06/14/2008   Essential hypertension 06/14/2008   ASTHMATIC BRONCHITIS, ACUTE 06/14/2008   ALLERGIC RHINITIS 06/14/2008   LUPUS 06/14/2008    Past Medical History:  Diagnosis Date   Arthritis    "little bit qwhere" (01/24/2012)   Asthma    Chronic back pain    "neck to tailbone" (01/24/2012)   Chronic bronchitis ([place])    "last time ~ 1 month ago" (01/24/2012)   COPD (chronic obstructive pulmonary disease) ([place])    Depression    takes Zoloft daily   Fibromyalgia    History of bladder infections    sees a urologist--on long term Macrodantin nightly   History of blood transfusion    as a child   History of blood transfusion    "when I was a child" (01/24/2012)   Hyperlipidemia    takes Simvastatin daily   Hypertension    takes Maxzide daily   Hypothyroidism    takes Synthroid daily   Insomnia    takes Ambien nightly   Lupus ([place])    Neck pain    herniated disc and radiculopathy   Peripheral neuropathy    Pneumonia 2012   "have had it a couple times" (01/24/2012)   Smokers' cough (HCC)    has albuterol prn   Type II diabetes mellitus (HCC)    takes Janumet daily   Urinary frequency     Family History  Problem Relation Age of Onset   Heart disease Mother    Arrhythmia Father    Diabetes Sister    Heart disease Sister    Blindness Brother    Heart attack Brother  Diabetes Brother    Past Surgical History:  Procedure Laterality Date   ANTERIOR CERVICAL DECOMP/DISCECTOMY FUSION  2002; 01/24/2012   ANTERIOR CERVICAL DECOMP/DISCECTOMY FUSION  01/24/2012   Procedure: ANTERIOR CERVICAL DECOMPRESSION/DISCECTOMY FUSION 1 LEVEL;  Surgeon: Cristi Loron, MD;  Location: [facility];  Service: Neurosurgery;  Laterality: N/A;  CERVICAL THREE-FOUR anterior cervical decompression with fusion interbody prothesis plating and bonegraft   APPENDECTOMY  11/04/1958   BACK SURGERY   03/06/1999   BLADDER SURGERY  03/06/1975   through abdomen   BLADDER SUSPENSION  03/05/1972   BUNIONECTOMY  11/04/1978   left   CARDIAC CATHETERIZATION  03/05/2006   CATARACT EXTRACTION     COLONOSCOPY     ESOPHAGOGASTRODUODENOSCOPY     LUMBAR FUSION  03/06/2007   RECTOCELE REPAIR  03/05/1998   VAGINAL HYSTERECTOMY  03/05/1972   Social History   Social History Narrative   Not on file   Immunization History  Administered Date(s) Administered   Influenza Split 01/25/2012   Influenza, High Dose Seasonal PF 01/01/2017   Influenza-Unspecified 12/03/2012   Pneumococcal Conjugate-13 12/17/2017   Pneumococcal Polysaccharide-23 01/25/2012   Tdap 04/14/2014     Objective: Vital Signs: There were no vitals taken for this visit.   Physical Exam   Musculoskeletal Exam: ***  CDAI Exam: CDAI Score: -- Patient Global: --; Provider Global: -- Swollen: --; Tender: -- Joint Exam 02/15/2021   No joint exam has been documented for this visit   There is currently no information documented on the homunculus. Go to the Rheumatology activity and complete the homunculus joint exam.  Investigation: No additional findings.  Imaging: OCT, Retina - OU - Both Eyes  Result Date: 01/19/2021 Right Eye Quality was good. Central Foveal Thickness: 287. Progression has been stable. Findings include normal foveal contour, no IRF, no SRF, retinal drusen . Left Eye Quality was good. Central Foveal Thickness: 284. Progression has been stable. Findings include normal foveal contour, no IRF, no SRF, retinal drusen , subretinal hyper-reflective material (Mild central SRHM / vitelliform like lesion, no DME). Notes *Images captured and stored on drive Diagnosis / Impression: NFP; no IRF/SRF OU No DME OU OS: Mild central SRHM / vitelliform like lesion, no DME Clinical management: See below Abbreviations: NFP - Normal foveal profile. CME - cystoid macular edema. PED - pigment epithelial detachment. IRF -  intraretinal fluid. SRF - subretinal fluid. EZ - ellipsoid zone. ERM - epiretinal membrane. ORA - outer retinal atrophy. ORT - outer retinal tubulation. SRHM - subretinal hyper-reflective material. IRHM - intraretinal hyper-reflective material    Recent Labs: Lab Results  Component Value Date   WBC 15.3 (H) 09/17/2020   HGB 13.8 09/17/2020   PLT 334 09/17/2020   NA 134 (L) 09/17/2020   K 4.2 09/17/2020   CL 99 09/17/2020   CO2 25 09/17/2020   GLUCOSE 61 (L) 09/17/2020   BUN 23 09/17/2020   CREATININE 1.09 (H) 09/17/2020   BILITOT 0.5 09/17/2020   ALKPHOS 71 09/17/2020   AST 16 09/17/2020   ALT 13 09/17/2020   PROT 7.9 09/17/2020   ALBUMIN 3.9 09/17/2020   CALCIUM 9.2 09/17/2020   GFRAA 52 (L) 06/16/2020   QFTBGOLDPLUS NEGATIVE 06/16/2020    Speciality Comments: No specialty comments available.  Procedures:  No procedures performed Allergies: Erythromycin, Penicillins, Gadolinium derivatives, Iodinated diagnostic agents, Latex, Morphine, Other, Prednisone, Sulfonamide derivatives, Varenicline tartrate, Atorvastatin, and Aspirin   Assessment / Plan:     Visit Diagnoses: No diagnosis found.  Orders: No orders  of the defined types were placed in this encounter.  No orders of the defined types were placed in this encounter.   Face-to-face time spent with patient was *** minutes. Greater than 50% of time was spent in counseling and coordination of care.  Follow-Up Instructions: No follow-ups on file.   Earnestine Mealing, CMA  Note - This record has been created using Editor, commissioning.  Chart creation errors have been sought, but may not always  have been located. Such creation errors do not reflect on  the standard of medical care.

## 2021-02-07 ENCOUNTER — Telehealth: Payer: Self-pay

## 2021-02-07 NOTE — Telephone Encounter (Signed)
PA renewal automatically initiated by CMM.  Submitted a Prior Authorization request to [facility] for ENBREL via CoverMyMeds. Will update once we receive a response.   Key: [id number] PA Case ID: [id number]

## 2021-02-08 ENCOUNTER — Telehealth: Payer: Self-pay

## 2021-02-08 NOTE — Telephone Encounter (Signed)
Received notification from [facility] regarding a prior authorization for ENBREL. Authorization has been APPROVED from 02/08/2021 to 03/04/2022. Approval letter sent to scan center.  Authorization # [id number]

## 2021-02-08 NOTE — Telephone Encounter (Signed)
Patient advised she would need to continue to hold her Enbrel until she has completed the antibiotics and the infections has cleared. Patient advised she may contact the office if she begins to flare and we can address it with Dr. Corliss Skains. Patient expressed understanding.

## 2021-02-08 NOTE — Telephone Encounter (Signed)
Patient called stating Dr. [name] prescribed antibiotics for a bite on her hand that became infected.  Patient states she has not taken her Enbrel injection and was planning to start on Sunday, 02/12/21, but was just told she needs to continue taking antibiotics for the next 10 days.  Patient is worried that she has been off her Enbrel too long and requested a return call.

## 2021-02-15 ENCOUNTER — Ambulatory Visit: Payer: Medicare Other | Admitting: Physician Assistant

## 2021-02-15 DIAGNOSIS — L930 Discoid lupus erythematosus: Secondary | ICD-10-CM

## 2021-02-15 DIAGNOSIS — Z8639 Personal history of other endocrine, nutritional and metabolic disease: Secondary | ICD-10-CM

## 2021-02-15 DIAGNOSIS — Z8659 Personal history of other mental and behavioral disorders: Secondary | ICD-10-CM

## 2021-02-15 DIAGNOSIS — M5030 Other cervical disc degeneration, unspecified cervical region: Secondary | ICD-10-CM

## 2021-02-15 DIAGNOSIS — Z8709 Personal history of other diseases of the respiratory system: Secondary | ICD-10-CM

## 2021-02-15 DIAGNOSIS — R252 Cramp and spasm: Secondary | ICD-10-CM

## 2021-02-15 DIAGNOSIS — M659 Synovitis and tenosynovitis, unspecified: Secondary | ICD-10-CM

## 2021-02-15 DIAGNOSIS — Z79899 Other long term (current) drug therapy: Secondary | ICD-10-CM

## 2021-02-15 DIAGNOSIS — M797 Fibromyalgia: Secondary | ICD-10-CM

## 2021-02-15 DIAGNOSIS — M0600 Rheumatoid arthritis without rheumatoid factor, unspecified site: Secondary | ICD-10-CM

## 2021-02-15 DIAGNOSIS — F17200 Nicotine dependence, unspecified, uncomplicated: Secondary | ICD-10-CM

## 2021-02-15 DIAGNOSIS — I10 Essential (primary) hypertension: Secondary | ICD-10-CM

## 2021-02-15 DIAGNOSIS — M5136 Other intervertebral disc degeneration, lumbar region: Secondary | ICD-10-CM

## 2021-02-17 NOTE — Progress Notes (Signed)
Office Visit Note  Patient: [name]             Date of Birth: 1945-08-26           MRN: [id number]             PCP: Willey Blade, MD Referring: Willey Blade, MD Visit Date: 03/03/2021 Occupation: @GUAROCC @  Subjective:  Recent wound infection   History of Present Illness: [name] is a 75 y.o. female with history of seronegative rheumatoid arthritis, discoid lupus, DDD, and fibromyalgia.  She is prescribed Enbrel 50 mg subcutaneous injections once weekly.  She has been holding enbrel for 1 month due to being treated for a cat bite on 01/27/2021.  She initially presented to [facility] urgent care and was tracking him and went to the ED for a wound check on 01/29/2021 and 01/30/2021.  She developed an abscess requiring I&D as well as IV antibiotics.  She was treated outpatient with doxycycline.  She followed up with Dr. Amedeo Plenty on 02/08/2021 followed by 03/01/2021.  She has been cleared to restart on Enbrel since the infection is completely cleared. She reports that she has had some increased pain and stiffness involving both hands, both knees, both ankle joints while off of Enbrel.  She denies any obvious joint swelling.  She has been taking Tylenol as needed for moderate pain relief and tramadol as needed for severe pain relief.  Overall she notices significant improvement while on Enbrel. Besides for the cat bite she has not had any other recent infections.  She received the annual flu shot.   Activities of Daily Living:  Patient reports morning stiffness for 30 minutes.   Patient Reports nocturnal pain.  Difficulty dressing/grooming: Reports Difficulty climbing stairs: Reports Difficulty getting out of chair: Reports Difficulty using hands for taps, buttons, cutlery, and/or writing: Reports  Review of Systems  Constitutional:  Positive for fatigue.  HENT:  Positive for mouth dryness and nose dryness. Negative for mouth sores.   Eyes:  Positive for pain,  itching and dryness.  Respiratory:  Negative for shortness of breath and difficulty breathing.   Cardiovascular:  Negative for chest pain and palpitations.  Gastrointestinal:  Negative for blood in stool, constipation and diarrhea.  Endocrine: Negative for increased urination.  Genitourinary:  Negative for difficulty urinating.  Musculoskeletal:  Positive for joint pain, joint pain, myalgias, morning stiffness, muscle tenderness and myalgias. Negative for joint swelling.  Skin:  Negative for color change, rash and redness.  Allergic/Immunologic: Positive for susceptible to infections.  Neurological:  Positive for weakness. Negative for dizziness, numbness and headaches.  Hematological:  Negative for bruising/bleeding tendency.  Psychiatric/Behavioral:  Negative for confusion.    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   Gastroesophageal reflux disease without esophagitis 11/27/2019   Generalized anxiety disorder 11/27/2019   Hypothyroidism 11/27/2019   Smoker 11/27/2019   Type 2 diabetes mellitus without complication ([place]) 0000000   Varicose veins of lower extremity 11/27/2019   Vitamin D deficiency 11/27/2019   Arthritis of hand 11/05/2019   Inflammation of joint of both hands 11/05/2019   Encounter for orthopedic follow-up care 01/09/2019   Tenosynovitis of fingers 09/22/2018   Tenosynovitis of right wrist 09/22/2018   Pain in right hand 09/08/2018   Nonintractable headache 08/16/2016   Carotid artery tenderness 10/26/2015   Leg pain, left 07/27/2014   Back pain of thoracolumbar region 08/11/2012  Renal cyst 08/04/2012   Cervical spondylosis with radiculopathy 01/24/2012   Chronic interstitial cystitis 11/01/2011   Mixed incontinence 11/01/2011   EMPHYSEMA 08/05/2008   LEUKOCYTOSIS 06/25/2008   COPD 06/25/2008   GASTROPARESIS 06/25/2008   DM 06/14/2008   HYPERLIPIDEMIA 06/14/2008    Essential hypertension 06/14/2008   ASTHMATIC BRONCHITIS, ACUTE 06/14/2008   ALLERGIC RHINITIS 06/14/2008   LUPUS 06/14/2008    Past Medical History:  Diagnosis Date   Arthritis    "little bit qwhere" (01/24/2012)   Asthma    Chronic back pain    "neck to tailbone" (01/24/2012)   Chronic bronchitis (HCC)    "last time ~ 1 month ago" (01/24/2012)   COPD (chronic obstructive pulmonary disease) (HCC)    Depression    takes Zoloft daily   Fibromyalgia    History of bladder infections    sees a urologist--on long term Macrodantin nightly   History of blood transfusion    as a child   History of blood transfusion    "when I was a child" (01/24/2012)   Hyperlipidemia    takes Simvastatin daily   Hypertension    takes Maxzide daily   Hypothyroidism    takes Synthroid daily   Insomnia    takes Ambien nightly   Lupus (HCC)    Neck pain    herniated disc and radiculopathy   Peripheral neuropathy    Pneumonia 2012   "have had it a couple times" (01/24/2012)   Smokers' cough (HCC)    has albuterol prn   Type II diabetes mellitus (HCC)    takes Janumet daily   Urinary frequency     Family History  Problem Relation Age of Onset   Heart disease Mother    Arrhythmia Father    Diabetes Sister    Heart disease Sister    Blindness Brother    Heart attack Brother    Diabetes Brother    Past Surgical History:  Procedure Laterality Date   ANTERIOR CERVICAL DECOMP/DISCECTOMY FUSION  2002; 01/24/2012   ANTERIOR CERVICAL DECOMP/DISCECTOMY FUSION  01/24/2012   Procedure: ANTERIOR CERVICAL DECOMPRESSION/DISCECTOMY FUSION 1 LEVEL;  Surgeon: Cristi Loron, MD;  Location: [facility];  Service: Neurosurgery;  Laterality: N/A;  CERVICAL THREE-FOUR anterior cervical decompression with fusion interbody prothesis plating and bonegraft   APPENDECTOMY  11/04/1958   BACK SURGERY  03/06/1999   BLADDER SURGERY  03/06/1975   through abdomen   BLADDER SUSPENSION  03/05/1972    BUNIONECTOMY  11/04/1978   left   CARDIAC CATHETERIZATION  03/05/2006   CATARACT EXTRACTION     COLONOSCOPY     ESOPHAGOGASTRODUODENOSCOPY     LUMBAR FUSION  03/06/2007   MOUTH SURGERY     growth removed inside mouth per patient   RECTOCELE REPAIR  03/05/1998   VAGINAL HYSTERECTOMY  03/05/1972   Social History   Social History Narrative   Not on file   Immunization History  Administered Date(s) Administered   Influenza Split 01/25/2012   Influenza, High Dose Seasonal PF 01/01/2017   Influenza-Unspecified 12/03/2012   Pneumococcal Conjugate-13 12/17/2017   Pneumococcal Polysaccharide-23 01/25/2012   Tdap 04/14/2014     Objective: Vital Signs: BP (!) 159/84 (BP Location: Left Arm, Patient Position: Sitting, Cuff Size: Normal)    Pulse 73    Ht 5\' 5"  (1.651 m)    Wt 133 lb 3.2 oz (60.4 kg)    BMI 22.17 kg/m    Physical Exam Vitals and nursing note reviewed.  Constitutional:  Appearance: She is well-developed.  HENT:     Head: Normocephalic and atraumatic.  Eyes:     Conjunctiva/sclera: Conjunctivae normal.  Pulmonary:     Effort: Pulmonary effort is normal.  Abdominal:     Palpations: Abdomen is soft.  Musculoskeletal:     Cervical back: Normal range of motion.  Skin:    General: Skin is warm and dry.     Capillary Refill: Capillary refill takes less than 2 seconds.  Neurological:     Mental Status: She is alert and oriented to person, place, and time.  Psychiatric:        Behavior: Behavior normal.     Musculoskeletal Exam: C-spine is limited and painful range of motion.  Painful range of motion of the lumbar spine.  Shoulder joints, elbow joints, wrist joints have good range of motion with no discomfort.  Some tenderness and mild inflammation over the right second and third MCP joints.  PIP and DIP thickening consistent with osteoarthritis of both hands.  Hip joints have good range of motion with no groin pain.  No tenderness over trochanteric bursa  bilaterally.  Knee joints have good range of motion with no warmth or effusion.  Ankle joints have good range of motion with no tenderness or joint swelling.  CDAI Exam: CDAI Score: 0.8  Patient Global: 4 mm; Provider Global: 4 mm Swollen: 0 ; Tender: 0  Joint Exam 03/03/2021   No joint exam has been documented for this visit   There is currently no information documented on the homunculus. Go to the Rheumatology activity and complete the homunculus joint exam.  Investigation: No additional findings.  Imaging: No results found.  Recent Labs: Lab Results  Component Value Date   WBC 15.3 (H) 09/17/2020   HGB 13.8 09/17/2020   PLT 334 09/17/2020   NA 134 (L) 09/17/2020   K 4.2 09/17/2020   CL 99 09/17/2020   CO2 25 09/17/2020   GLUCOSE 61 (L) 09/17/2020   BUN 23 09/17/2020   CREATININE 1.09 (H) 09/17/2020   BILITOT 0.5 09/17/2020   ALKPHOS 71 09/17/2020   AST 16 09/17/2020   ALT 13 09/17/2020   PROT 7.9 09/17/2020   ALBUMIN 3.9 09/17/2020   CALCIUM 9.2 09/17/2020   GFRAA 52 (L) 06/16/2020   QFTBGOLDPLUS NEGATIVE 06/16/2020    Speciality Comments: No specialty comments available.  Procedures:  No procedures performed Allergies: Erythromycin, Penicillins, Gadolinium derivatives, Iodinated contrast media, Latex, Morphine, Other, Prednisone, Sulfonamide derivatives, Varenicline tartrate, Atorvastatin, and Aspirin    Assessment / Plan:     Visit Diagnoses: Seronegative rheumatoid arthritis ([place]): Presented initially with severe inflammatory arthritis and tenosynovitis: She has clinically done well on Enbrel 50 mg subcutaneous injections every week.  Her rheumatoid arthritis has been well controlled on the current treatment regimen.  Unfortunately she has been holding Enbrel for the past 1 month due to a wound infection secondary to a cat bite on 01/27/2021.  She was initially evaluated at urgent care followed by the ED on 01/29/2021 at which time surgery had an abscess and  underwent an I&D.  She was treated with IV antibiotics followed by doxycycline outpatient.  She followed up with Dr. Amedeo Plenty who extended the round of antibiotics which she completed 8 days ago.  She had a follow-up visit with Dr. Amedeo Plenty on 03/01/2021 and was cleared to restart Enbrel as prescribed.  She has not had any recurrence of symptoms since discontinuing the antibiotics. Since being off of Enbrel she has had  some increased stiffness in both hands, both knees, and both ankle joints.  She has some inflammation over the right second and third MCPs.  She plans on restarting Enbrel today.  She requested a refill to be sent to the pharmacy.  She was advised to notify us if she continues to have persistent pain or inflammation.  She will follow-up in the office in 5 months.  High risk medication use - Enbrel 50 mg sq injections once weekly-started on 11/05/19.  D/c MTX 0.4 ml sq wkly inj-low dose- elevated creatinine and recurrent bronchitis.   CBC and BMP updated on 01/29/2021.  CBC and CMP will be drawn today.  Her next lab work will be due in early March 2023.  TB Gold negative on 06/16/2020. She completed a course of antibiotics 8 days ago and was cleared by Dr. Amedeo Plenty on 03/01/21 to restart on enbrel.   - Plan: CBC with Differential/Platelet, COMPLETE METABOLIC PANEL WITH GFR Discussed the importance of holding Enbrel if she develops signs or symptoms of an infection and to resume once the infection has completely cleared. Discussed the importance of yearly skin examinations while on Enbrel due to the increased risk for nonmelanoma skin cancers.  Tenosynovitis of fingers: Resolved  Tenosynovitis of right wrist: Resolved  Discoid lupus: No recurrence  DDD (degenerative disc disease), cervical: Painful limited range of motion.  No symptoms of radiculopathy at this time.  Followed by Dr. Arnoldo Morale.  DDD (degenerative disc disease), lumbar - Dr. Arnoldo Morale: Chronic pain.  Midline spinal tenderness and  painful range of motion.  Fibromyalgia: She experiences intermittent myalgias and muscle tenderness due to fibromyalgia.  Discussed the importance of regular exercise and good sleep hygiene.  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  She experiences pain in her feet secondary to neuropathy.  She requested a referral to neurology for further evaluation.  History of muscle spasm: She has been experiencing frequent and severe muscle spasms in upper and lower extremities.  She describes her symptoms of spasticity and not muscle cramping.  She requested a referral to neurology for further evaluation.  Orders: Orders Placed This Encounter  Procedures   CBC with Differential/Platelet   COMPLETE METABOLIC PANEL WITH GFR   Ambulatory referral to Neurology   No orders of the defined types were placed in this encounter.     Follow-Up Instructions: Return in about 5 months (around 08/01/2021) for Rheumatoid arthritis, DDD, Fibromyalgia, Discoid lupus.   Ofilia Neas, PA-C  Note - This record has been created using Dragon software.  Chart creation errors have been sought, but may not always  have been located. Such creation errors do not reflect on  the standard of medical care.,

## 2021-03-03 ENCOUNTER — Encounter: Payer: Self-pay | Admitting: Physician Assistant

## 2021-03-03 ENCOUNTER — Ambulatory Visit: Payer: Medicare Other | Admitting: Physician Assistant

## 2021-03-03 ENCOUNTER — Other Ambulatory Visit: Payer: Self-pay

## 2021-03-03 VITALS — BP 159/84 | HR 73 | Ht 65.0 in | Wt 133.2 lb

## 2021-03-03 DIAGNOSIS — M797 Fibromyalgia: Secondary | ICD-10-CM

## 2021-03-03 DIAGNOSIS — M0600 Rheumatoid arthritis without rheumatoid factor, unspecified site: Secondary | ICD-10-CM

## 2021-03-03 DIAGNOSIS — Z8639 Personal history of other endocrine, nutritional and metabolic disease: Secondary | ICD-10-CM

## 2021-03-03 DIAGNOSIS — M65949 Unspecified synovitis and tenosynovitis, unspecified hand: Secondary | ICD-10-CM

## 2021-03-03 DIAGNOSIS — M659 Synovitis and tenosynovitis, unspecified: Secondary | ICD-10-CM

## 2021-03-03 DIAGNOSIS — Z8739 Personal history of other diseases of the musculoskeletal system and connective tissue: Secondary | ICD-10-CM

## 2021-03-03 DIAGNOSIS — Z79899 Other long term (current) drug therapy: Secondary | ICD-10-CM

## 2021-03-03 DIAGNOSIS — F17200 Nicotine dependence, unspecified, uncomplicated: Secondary | ICD-10-CM

## 2021-03-03 DIAGNOSIS — M5136 Other intervertebral disc degeneration, lumbar region: Secondary | ICD-10-CM

## 2021-03-03 DIAGNOSIS — I10 Essential (primary) hypertension: Secondary | ICD-10-CM

## 2021-03-03 DIAGNOSIS — M65931 Unspecified synovitis and tenosynovitis, right forearm: Secondary | ICD-10-CM

## 2021-03-03 DIAGNOSIS — L930 Discoid lupus erythematosus: Secondary | ICD-10-CM | POA: Diagnosis not present

## 2021-03-03 DIAGNOSIS — Z8659 Personal history of other mental and behavioral disorders: Secondary | ICD-10-CM

## 2021-03-03 DIAGNOSIS — M51369 Other intervertebral disc degeneration, lumbar region without mention of lumbar back pain or lower extremity pain: Secondary | ICD-10-CM

## 2021-03-03 DIAGNOSIS — M5030 Other cervical disc degeneration, unspecified cervical region: Secondary | ICD-10-CM

## 2021-03-03 DIAGNOSIS — Z8709 Personal history of other diseases of the respiratory system: Secondary | ICD-10-CM

## 2021-03-03 DIAGNOSIS — G629 Polyneuropathy, unspecified: Secondary | ICD-10-CM

## 2021-03-03 NOTE — Patient Instructions (Signed)
Standing Labs We placed an order today for your standing lab work.   Please have your standing labs drawn at end of March/early  April and every 3 months   If possible, please have your labs drawn 2 weeks prior to your appointment so that the provider can discuss your results at your appointment.  Please note that you may see your imaging and lab results in MyChart before we have reviewed them. We may be awaiting multiple results to interpret others before contacting you. Please allow our office up to 72 hours to thoroughly review all of the results before contacting the office for clarification of your results.  We have open lab daily: Monday through Thursday from 1:30-4:30 PM and Friday from 1:30-4:00 PM at the office of Dr. Pollyann Savoy, [facility].   Please be advised, all patients with office appointments requiring lab work will take precedent over walk-in lab work.  If possible, please come for your lab work on Monday and Friday afternoons, as you may experience shorter wait times. The office is located at [address], [place], [name] [address] No appointment is necessary.   Labs are drawn by [facility]. Please bring your co-pay at the time of your lab draw.  You may receive a bill from [facility] for your lab work.  If you wish to have your labs drawn at another location, please call the office 24 hours in advance to send orders.  If you have any questions regarding directions or hours of operation,  please call [phone].   As a reminder, please drink plenty of water prior to coming for your lab work. Thanks!  If you have signs or symptoms of an infection or start antibiotics: First, call your PCP for workup of your infection. Hold your medication through the infection, until you complete your antibiotics, and until symptoms resolve if you take the following: Injectable medication (Actemra, Benlysta, Cimzia, Cosentyx, Enbrel, Humira, Kevzara, Orencia,  Remicade, Simponi, Stelara, Taltz, Tremfya) Methotrexate Leflunomide (Arava) Mycophenolate (Cellcept) Harriette Ohara, Olumiant, or Rinvoq

## 2021-03-03 NOTE — Telephone Encounter (Signed)
Please refill Enbrel once we find out who the new pharmacy is for the [employer] patient assistance.    I attempted to contact [employer] today and they are currently offline until 03/07/2021 due to a systems upgrade.

## 2021-03-04 LAB — COMPLETE METABOLIC PANEL WITH GFR
AG Ratio: 1.4 (calc) (ref 1.0–2.5)
ALT: 12 U/L (ref 6–29)
AST: 17 U/L (ref 10–35)
Albumin: 4.2 g/dL (ref 3.6–5.1)
Alkaline phosphatase (APISO): 62 U/L (ref 37–153)
BUN: 15 mg/dL (ref 7–25)
CO2: 23 mmol/L (ref 20–32)
Calcium: 8.9 mg/dL (ref 8.6–10.4)
Chloride: 101 mmol/L (ref 98–110)
Creat: 0.9 mg/dL (ref 0.60–1.00)
Globulin: 3.1 g/dL (calc) (ref 1.9–3.7)
Glucose, Bld: 90 mg/dL (ref 65–99)
Potassium: 4.5 mmol/L (ref 3.5–5.3)
Sodium: 137 mmol/L (ref 135–146)
Total Bilirubin: 0.4 mg/dL (ref 0.2–1.2)
Total Protein: 7.3 g/dL (ref 6.1–8.1)
eGFR: 66 mL/min/{1.73_m2} (ref >=60)

## 2021-03-04 LAB — CBC WITH DIFFERENTIAL/PLATELET
Absolute Monocytes: 635 cells/uL (ref 200–950)
Basophils Absolute: 138 cells/uL (ref 0–200)
Basophils Relative: 1 %
Eosinophils Absolute: 428 cells/uL (ref 15–500)
Eosinophils Relative: 3.1 %
HCT: 40 % (ref 35.0–45.0)
Hemoglobin: 13 g/dL (ref 11.7–15.5)
Lymphs Abs: 1490 cells/uL (ref 850–3900)
MCH: 29.7 pg (ref 27.0–33.0)
MCHC: 32.5 g/dL (ref 32.0–36.0)
MCV: 91.5 fL (ref 80.0–100.0)
MPV: 9.4 fL (ref 7.5–12.5)
Monocytes Relative: 4.6 %
Neutro Abs: 11109 cells/uL — ABNORMAL HIGH (ref 1500–7800)
Neutrophils Relative %: 80.5 %
Platelets: 328 10*3/uL (ref 140–400)
RBC: 4.37 10*6/uL (ref 3.80–5.10)
RDW: 13.1 % (ref 11.0–15.0)
Total Lymphocyte: 10.8 %
WBC: 13.8 10*3/uL — ABNORMAL HIGH (ref 3.8–10.8)

## 2021-03-07 NOTE — Progress Notes (Signed)
CMP WNL.  WBC count is elevated.  Absolute neutrophil count is slightly elevated. Rest of CBC WNL.

## 2021-03-09 MED ORDER — ENBREL MINI 50 MG/ML ~~LOC~~ SOCT: 50.0000 mg | SUBCUTANEOUS | 2 refills | Status: DC

## 2021-03-09 NOTE — Telephone Encounter (Signed)
Next Visit: 08/02/2020  Last Visit: 03/03/2021  Last Fill: 10/06/2020  [id number] rheumatoid arthritis  Current Dose per office note 03/03/2021:  Enbrel 50 mg sq injections once weekly  Labs: 03/03/2021 CMP WNL.  WBC count is elevated.  Absolute neutrophil count is slightly elevated. Rest of CBC WNL.    TB Gold: 06/16/2020 Neg    Okay to refill Enbrel?

## 2021-04-18 ENCOUNTER — Telehealth: Payer: Self-pay

## 2021-04-18 DIAGNOSIS — G629 Polyneuropathy, unspecified: Secondary | ICD-10-CM

## 2021-04-18 NOTE — Telephone Encounter (Signed)
Ok to place new referral to [facility].  I am not familiar with any of the other providers so I cannot make any new other recommendations.

## 2021-04-18 NOTE — Telephone Encounter (Signed)
Patient left a voicemail requesting to speak with Ladona Ridgel regarding the referral she made for her.

## 2021-04-18 NOTE — Telephone Encounter (Signed)
Patient states she was referred to neurology. Patient states she received a call from [facility] and was scheduled for the first of March. Patient states she was scheduled with Dr. Terrace Arabia. Patient states her husband saw Dr. Terrace Arabia. Patient states she they were not happy with the care received by Dr. Terrace Arabia. Patient would like to be referred to [facility] instead. Patient states she wants to be comfortable with who she is seeing.

## 2021-04-18 NOTE — Telephone Encounter (Signed)
Please call to clarify if there is an issue with the referral placed in December 2022 to neurology.  I will be unable to talk to the patient directly until I am done seeing patients this afternoon.

## 2021-04-18 NOTE — Telephone Encounter (Signed)
I requested for the patient to be seen by Dr. Vickey Huger or Dr. Lucia Gaskins.  Please see if either of these providers can see the patient.

## 2021-04-18 NOTE — Telephone Encounter (Signed)
Referral placed. Left message to advise patient new referral placed to [facility].  Advised Lovena Le is not familiar with any of the other providers so she cannot make any new other recommendations.

## 2021-04-19 ENCOUNTER — Encounter: Payer: Self-pay | Admitting: Neurology

## 2021-05-04 ENCOUNTER — Ambulatory Visit: Payer: Medicare Other | Admitting: Neurology

## 2021-05-15 ENCOUNTER — Other Ambulatory Visit: Payer: Self-pay

## 2021-05-15 DIAGNOSIS — M0600 Rheumatoid arthritis without rheumatoid factor, unspecified site: Secondary | ICD-10-CM

## 2021-05-15 MED ORDER — ENBREL MINI 50 MG/ML ~~LOC~~ SOCT: 50.0000 mg | SUBCUTANEOUS | 0 refills | Status: DC

## 2021-05-15 NOTE — Telephone Encounter (Signed)
Next Visit: 08/02/2021 ? ?Last Visit: 03/03/2021 ? ?Last Fill: 03/09/2021 ? ?[id number] rheumatoid arthritis  ? ?Current Dose per office note 03/03/2021:  Enbrel 50 mg sq injections once weekly ? ?Labs: 03/03/2022 CMP WNL.  WBC count is elevated.  Absolute neutrophil count is slightly elevated. Rest of CBC WNL.   ? ?TB Gold: 06/16/2020 Neg   ? ?Okay to refill Enbrel?  ?

## 2021-05-15 NOTE — Telephone Encounter (Signed)
Patient requested prescription refill of Enbrel to be sent to [facility].   

## 2021-06-07 ENCOUNTER — Other Ambulatory Visit (INDEPENDENT_AMBULATORY_CARE_PROVIDER_SITE_OTHER): Payer: Medicare Other

## 2021-06-07 ENCOUNTER — Ambulatory Visit: Payer: Medicare Other | Admitting: Neurology

## 2021-06-07 ENCOUNTER — Encounter: Payer: Self-pay | Admitting: Neurology

## 2021-06-07 VITALS — BP 161/75 | HR 79 | Ht 65.0 in | Wt 135.0 lb

## 2021-06-07 DIAGNOSIS — E1142 Type 2 diabetes mellitus with diabetic polyneuropathy: Secondary | ICD-10-CM

## 2021-06-07 DIAGNOSIS — R252 Cramp and spasm: Secondary | ICD-10-CM | POA: Diagnosis not present

## 2021-06-07 DIAGNOSIS — R2681 Unsteadiness on feet: Secondary | ICD-10-CM

## 2021-06-07 LAB — CK: Total CK: 44 U/L (ref 7–177)

## 2021-06-07 LAB — B12 AND FOLATE PANEL
Folate: 9.6 ng/mL (ref >5.9)
Vitamin B-12: 127 pg/mL — ABNORMAL LOW (ref 211–911)

## 2021-06-07 MED ORDER — TIZANIDINE HCL 2 MG PO TABS: ORAL_TABLET | ORAL | 5 refills | Status: DC

## 2021-06-07 NOTE — Patient Instructions (Addendum)
Start tizanidine 2mg  1 tablet at bedtime x 3 days, then increase to 1 tablet twice daily ? ?Check labs ? ?Call with an update in 2 weeks ? ?Start using a walker  ? ?Return to clinic in 6 months ? ?

## 2021-06-07 NOTE — Progress Notes (Signed)
?[facility] ?Neurology Division ?Clinic Note - Initial Visit ? ? ?Date: 06/07/21 ? ?[name] ?MRN: [id number] ?DOB: [birth year] ? ? ?Dear [name] Ales, PA-C: ? ?Thank you for your kind referral of [name] for consultation of muscle spasms and neuropathy. Although her history is well known to you, please allow [place] to reiterate it for the purpose of our medical record. The patient was accompanied to the clinic by self. ?  ? ?History of Present Illness: ?[name] is a 76 y.o. 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  ? ?She complains of spasms involving the arms, hands, feet, and legs which has been ongoing for the past year.  It can last 30-seconds.  Spasms are painful and worse in the legs at night time.  No weakness of the arms or legs.  She has tried magnesium, but develop diarrhea.  She takes flexeril 10mg  at bedtime which does not provide any relief.     ? ?She has numbness/tingling involving the toes for several years.  Symptoms are not very bothersome.  She has fallen three times over the past year.  She walks unassisted predominately.  When her back and joint pain is severe, she uses a cane.  ? ?She lives with husband in a one-level home.   ? ?Out-side paper records, electronic medical record, and images have been reviewed where available and summarized as:  ?MRI thoracic spine wo contrast 11/23/2017: ?1. Normal for age MRI appearance of the thoracic spine. No spinal stenosis or osseous abnormality. ?2. Widespread chronic cervical spine fusion with mild adjacent segment disease at C7-T1. ?3. Small volume retained secretions in the trachea. ? ?MRI lumbar spine 04/02/2016: ?1. Posterior lumbar interbody fusion at L4-5 without recurrent foraminal or central canal stenosis. ?2. At L3-4 there is a  broad-based disc bulge. Mild bilateral facet arthropathy. Bilateral lateral recess narrowing, right greater than left. Mild bilateral foraminal stenosis, right greater than left. No significant interval change compared with the prior exam. ? ?CTA neck 04/11/2016: ?1. Stable mild prominence of soft tissue about the distal common carotid arteries and carotid bifurcations bilaterally compatible with the diagnosis of carotidynia. There is no evidence for disease progression ?2. Bilateral atherosclerotic calcifications at the carotid bifurcations, right greater than left. There is no significant stenosis. ?3. Stable cervical spine fusion. ?4. Scarring at the lung apices. ?  ? ?Lab Results  ?Component Value Date  ? HGBA1C (H) 04/13/2010  ?  6.4 ?(NOTE)                                                                       According to the ADA Clinical Practice Recommendations for 2011, when HbA1c is used as a screening test:   >=6.5%   Diagnostic of Diabetes Mellitus           (if abnormal result ? is confirmed)  5.7-6.4%   Increased risk of developing Diabetes Mellitus  References:Diagnosis and Classification of Diabetes Mellitus,Diabetes Care,2011,34(Suppl 1):S62-S69 and Standards of Medical Care in         Diabetes - 2011,Diabetes 2012  ?(Suppl 1):S11-S61.  ? ? ?Lab Results  ?Component  Value Date  ? ESRSEDRATE 74 (H) 03/23/2020  ? ? ?Past Medical History:  ?Diagnosis Date  ? Arthritis   ? "little bit qwhere" (01/24/2012)  ? Asthma   ? Chronic back pain   ? "neck to tailbone" (01/24/2012)  ? Chronic bronchitis (HCC)   ? "last time ~ 1 month ago" (01/24/2012)  ? COPD (chronic obstructive pulmonary disease) (HCC)   ? Depression   ? takes Zoloft daily  ? Fibromyalgia   ? History of bladder infections   ? sees a urologist--on long term Macrodantin nightly  ? History of blood transfusion   ? as a child  ? History of blood transfusion   ? "when I was a child" (01/24/2012)  ? Hyperlipidemia   ? takes Simvastatin daily  ?  Hypertension   ? takes Maxzide daily  ? Hypothyroidism   ? takes Synthroid daily  ? Insomnia   ? takes Ambien nightly  ? Lupus (HCC)   ? Neck pain   ? herniated disc and radiculopathy  ? Peripheral neuropathy   ? Pneumonia 2012  ? "have had it a couple times" (01/24/2012)  ? Smokers' cough (HCC)   ? has albuterol prn  ? Type II diabetes mellitus (HCC)   ? takes Janumet daily  ? Urinary frequency   ? ? ?Past Surgical History:  ?Procedure Laterality Date  ? ANTERIOR CERVICAL DECOMP/DISCECTOMY FUSION  2002; 01/24/2012  ? ANTERIOR CERVICAL DECOMP/DISCECTOMY FUSION  01/24/2012  ? Procedure: ANTERIOR CERVICAL DECOMPRESSION/DISCECTOMY FUSION 1 LEVEL;  Surgeon: Cristi LoronJeffrey D Jenkins, MD;  Location: [facility];  Service: Neurosurgery;  Laterality: N/A;  CERVICAL THREE-FOUR anterior cervical decompression with fusion interbody prothesis plating and bonegraft  ? APPENDECTOMY  11/04/1958  ? BACK SURGERY  03/06/1999  ? BLADDER SURGERY  03/06/1975  ? through abdomen  ? BLADDER SUSPENSION  03/05/1972  ? BUNIONECTOMY  11/04/1978  ? left  ? CARDIAC CATHETERIZATION  03/05/2006  ? CATARACT EXTRACTION    ? COLONOSCOPY    ? ESOPHAGOGASTRODUODENOSCOPY    ? LUMBAR FUSION  03/06/2007  ? MOUTH SURGERY    ? growth removed inside mouth per patient  ? RECTOCELE REPAIR  03/05/1998  ? VAGINAL HYSTERECTOMY  03/05/1972  ? ? ? ?Medications:  ?Outpatient Encounter Medications as of 06/07/2021  ?Medication Sig  ? albuterol (VENTOLIN HFA) 108 (90 Base) MCG/ACT inhaler as needed.  ? BREZTRI AEROSPHERE 160-9-4.8 MCG/ACT AERO 2 (two) times daily.  ? donepezil (ARICEPT) 5 MG tablet Take 5 mg by mouth at bedtime.  ? Etanercept (ENBREL MINI) 50 MG/ML SOCT Inject 50 mg into the skin once a week.  ? insulin glargine, 1 Unit Dial, (TOUJEO SOLOSTAR) 300 UNIT/ML Solostar Pen 35 units in the morning and 50 units at bedtime.  ? levothyroxine (SYNTHROID, LEVOTHROID) 50 MCG tablet Take 50 mcg by mouth daily before breakfast.  ? metFORMIN (GLUCOPHAGE) 1000 MG tablet Take  1,000 mg by mouth 2 (two) times daily with a meal.  ? ONETOUCH VERIO test strip 1 each 4 (four) times daily.  ? sertraline (ZOLOFT) 100 MG tablet Take 100 mg by mouth daily.   ? tiZANidine (ZANAFLEX) 2 MG tablet Start 1 tablet at bedtime x 3 days, then increase to 1 tablet twice daily  ? triamterene-hydrochlorothiazide (MAXZIDE-25) 37.5-25 MG tablet Take 1 tablet by mouth daily.  ? VITAMIN D PO Take by mouth daily.  ? zolpidem (AMBIEN) 5 MG tablet Take 5 mg by mouth at bedtime. For sleep  ? [DISCONTINUED] cyclobenzaprine (FLEXERIL) 10 MG tablet  Take 10 mg by mouth 2 (two) times daily as needed.  ? MAGNESIUM PO Take by mouth daily. (Patient not taking: Reported on 03/03/2021)  ? metoCLOPramide (REGLAN) 5 MG tablet Take 5 mg by mouth daily.  (Patient not taking: Reported on 11/16/2020)  ? nitroGLYCERIN (NITROSTAT) 0.4 MG SL tablet Place 1 tablet (0.4 mg total) under the tongue every 5 (five) minutes x 3 doses as needed for chest pain. If no relief after 3rd dose, proceed to the ED for an evaluation (Patient not taking: Reported on 03/03/2021)  ? ?No facility-administered encounter medications on file as of 06/07/2021.  ? ? ?Allergies:  ?Allergies  ?Allergen Reactions  ? Erythromycin Anaphylaxis  ? Penicillins Anaphylaxis  ? Gadolinium Derivatives Hives and Rash  ?  Pt broke out in hives all over body.  Has IV steroids and Benadryl in hospital for MRI.  ? Iodinated Contrast Media Hives  ?  13-hour prep ?Pt had slight itching of her abd after her injection and had taken her 13 hr prep.  Consult with radiologist before another procedure to see if IV contrast is necessary, per Dr Roswell Nickel on 01/06/14, JB/  ? Latex Dermatitis and Other (See Comments)  ?  blisters  ? Morphine Nausea And Vomiting  ? Other Itching and Other (See Comments)  ?  Chlorhexin (CHG); "burning"  ? Prednisone Other (See Comments)  ?  "shakes me out of my frame" ?IM steroids aren't as bad as PO  ? Sulfonamide Derivatives Itching and Rash  ?  All over body ?   ? Varenicline Tartrate Nausea And Vomiting  ?   ?  ? Atorvastatin   ? Aspirin Nausea Only and Other (See Comments)  ?  Burning in stomach ?  ? ? ?Family History: ?Family History  ?Problem Relation Age of

## 2021-06-14 LAB — VITAMIN E
Gamma-Tocopherol (Vit E): 5.4 mg/L — ABNORMAL HIGH (ref <=4.3)
Vitamin E (Alpha Tocopherol): 14.9 mg/L (ref 5.7–19.9)

## 2021-06-14 LAB — PTH, INTACT AND CALCIUM
Calcium: 9.3 mg/dL (ref 8.6–10.4)
PTH: 30 pg/mL (ref 16–77)

## 2021-06-14 LAB — ALDOLASE: Aldolase: 3.3 U/L (ref <=8.1)

## 2021-06-16 ENCOUNTER — Other Ambulatory Visit: Payer: Self-pay

## 2021-06-16 MED ORDER — B-12 COMPLIANCE INJECTION 1000 MCG/ML IJ KIT: PACK | INTRAMUSCULAR | 0 refills | Status: DC

## 2021-06-28 ENCOUNTER — Ambulatory Visit: Payer: Medicare Other | Admitting: Cardiology

## 2021-07-13 ENCOUNTER — Ambulatory Visit (INDEPENDENT_AMBULATORY_CARE_PROVIDER_SITE_OTHER): Payer: Medicare Other | Admitting: Ophthalmology

## 2021-07-13 ENCOUNTER — Encounter (INDEPENDENT_AMBULATORY_CARE_PROVIDER_SITE_OTHER): Payer: Self-pay | Admitting: Ophthalmology

## 2021-07-13 DIAGNOSIS — I10 Essential (primary) hypertension: Secondary | ICD-10-CM

## 2021-07-13 DIAGNOSIS — Z961 Presence of intraocular lens: Secondary | ICD-10-CM

## 2021-07-13 DIAGNOSIS — H35033 Hypertensive retinopathy, bilateral: Secondary | ICD-10-CM | POA: Diagnosis not present

## 2021-07-13 DIAGNOSIS — H353132 Nonexudative age-related macular degeneration, bilateral, intermediate dry stage: Secondary | ICD-10-CM | POA: Diagnosis not present

## 2021-07-13 DIAGNOSIS — E119 Type 2 diabetes mellitus without complications: Secondary | ICD-10-CM

## 2021-07-13 NOTE — Progress Notes (Addendum)
?[facility] Note ? ?07/13/2021 ? ?  ? ?CHIEF COMPLAINT ?Patient presents for Retina Follow Up ? ? ?HISTORY OF PRESENT ILLNESS: ?[name] is a 76 y.o. female who presents to the clinic today for:  ? ?HPI   ? ? Retina Follow Up   ?Patient presents with  Dry AMD.  In both eyes.  Duration of 6 months.  Since onset it is stable.  I, the attending physician,  performed the HPI with the patient and updated documentation appropriately. ? ?  ?  ? ? Comments   ?6 month follow up DM II and AMD OU- Doing well, BS controlled, vision appears stable.   ?BS 140 this morning, she took 20u of insulin after.  ?A1C unsure but normal ? ?  ?  ?Last edited by Bernarda Caffey, MD on 07/13/2021 11:08 PM.  ?  ?Pt states vision is stable ? ?Referring physician: ?Willey Blade, MD ?[address] ?[address] ?[place],  [place] [address] ? ?HISTORICAL INFORMATION:  ? ?Selected notes from the [facility] ?Self referral for DEE -- pt of Dr. Katy Fitch ?[name]:  ?Ocular Hx- ?PMH- ?  ? ?CURRENT MEDICATIONS: ?No current outpatient medications on file. (Ophthalmic Drugs)  ? ?No current facility-administered medications for this visit. (Ophthalmic Drugs)  ? ?Current Outpatient Medications (Other)  ?Medication Sig  ? albuterol (VENTOLIN HFA) 108 (90 Base) MCG/ACT inhaler as needed.  ? BREZTRI AEROSPHERE 160-9-4.8 MCG/ACT AERO 2 (two) times daily.  ? Cyanocobalamin (B-12 COMPLIANCE INJECTION) 1000 MCG/ML KIT Vitamin B12 1039mg IM injection daily for 7 days, then weekly for 4 weeks, then monthly thereafter for 1 year.  ? donepezil (ARICEPT) 5 MG tablet Take 5 mg by mouth at bedtime.  ? Etanercept (ENBREL MINI) 50 MG/ML SOCT Inject 50 mg into the skin once a week.  ? insulin glargine, 1 Unit Dial, (TOUJEO SOLOSTAR) 300 UNIT/ML Solostar Pen 35 units in the morning and 50 units at bedtime.  ? levothyroxine (SYNTHROID, LEVOTHROID) 50 MCG tablet Take 50 mcg by mouth daily before breakfast.  ? metFORMIN (GLUCOPHAGE)  1000 MG tablet Take 1,000 mg by mouth 2 (two) times daily with a meal.  ? sertraline (ZOLOFT) 100 MG tablet Take 100 mg by mouth daily.   ? tiZANidine (ZANAFLEX) 2 MG tablet Start 1 tablet at bedtime x 3 days, then increase to 1 tablet twice daily  ? triamterene-hydrochlorothiazide (MAXZIDE-25) 37.5-25 MG tablet Take 1 tablet by mouth daily.  ? VITAMIN D PO Take by mouth daily.  ? zolpidem (AMBIEN) 5 MG tablet Take 5 mg by mouth at bedtime. For sleep  ? MAGNESIUM PO Take by mouth daily. (Patient not taking: Reported on 03/03/2021)  ? metoCLOPramide (REGLAN) 5 MG tablet Take 5 mg by mouth daily.  (Patient not taking: Reported on 11/16/2020)  ? nitroGLYCERIN (NITROSTAT) 0.4 MG SL tablet Place 1 tablet (0.4 mg total) under the tongue every 5 (five) minutes x 3 doses as needed for chest pain. If no relief after 3rd dose, proceed to the ED for an evaluation (Patient not taking: Reported on 03/03/2021)  ? ONETOUCH VERIO test strip 1 each 4 (four) times daily.  ? ?No current facility-administered medications for this visit. (Other)  ? ?REVIEW OF SYSTEMS: ?ROS   ?Positive for: Musculoskeletal, Endocrine, Cardiovascular, Eyes, Respiratory, Psychiatric ?Negative for: Constitutional, Gastrointestinal, Neurological, Skin, Genitourinary, HENT, Allergic/Imm, Heme/Lymph ?Last edited by HLeonie Douglas CChesneeon 07/13/2021  1:11 PM.  ?  ? ?ALLERGIES ?Allergies  ?Allergen Reactions  ?  Erythromycin Anaphylaxis  ? Penicillins Anaphylaxis  ? Gadolinium Derivatives Hives and Rash  ?  Pt broke out in hives all over body.  Has IV steroids and Benadryl in hospital for MRI.  ? Iodinated Contrast Media Hives  ?  13-hour prep ?Pt had slight itching of her abd after her injection and had taken her 13 hr prep.  Consult with radiologist before another procedure to see if IV contrast is necessary, per Dr Almyra Free on 01/06/14, JB/  ? Latex Dermatitis and Other (See Comments)  ?  blisters  ? Morphine Nausea And Vomiting  ? Other Itching and Other (See  Comments)  ?  Chlorhexin (CHG); "burning"  ? Prednisone Other (See Comments)  ?  "shakes me out of my frame" ?IM steroids aren't as bad as PO  ? Sulfonamide Derivatives Itching and Rash  ?  All over body ?  ? Varenicline Tartrate Nausea And Vomiting  ?   ?  ? Atorvastatin   ? Aspirin Nausea Only and Other (See Comments)  ?  Burning in stomach ?  ? ?PAST MEDICAL HISTORY ?Past Medical History:  ?Diagnosis Date  ? Arthritis   ? "little bit qwhere" (01/24/2012)  ? Asthma   ? Chronic back pain   ? "neck to tailbone" (01/24/2012)  ? Chronic bronchitis ([place])   ? "last time ~ 1 month ago" (01/24/2012)  ? COPD (chronic obstructive pulmonary disease) ([place])   ? Depression   ? takes Zoloft daily  ? Fibromyalgia   ? History of bladder infections   ? sees a urologist--on long term Macrodantin nightly  ? History of blood transfusion   ? as a child  ? History of blood transfusion   ? "when I was a child" (01/24/2012)  ? Hyperlipidemia   ? takes Simvastatin daily  ? Hypertension   ? takes Maxzide daily  ? Hypothyroidism   ? takes Synthroid daily  ? Insomnia   ? takes Ambien nightly  ? Lupus ([place])   ? Neck pain   ? herniated disc and radiculopathy  ? Peripheral neuropathy   ? Pneumonia 2012  ? "have had it a couple times" (01/24/2012)  ? Smokers' cough ([place])   ? has albuterol prn  ? Type II diabetes mellitus ([place])   ? takes Janumet daily  ? Urinary frequency   ? ?Past Surgical History:  ?Procedure Laterality Date  ? ANTERIOR CERVICAL DECOMP/DISCECTOMY FUSION  2002; 01/24/2012  ? ANTERIOR CERVICAL DECOMP/DISCECTOMY FUSION  01/24/2012  ? Procedure: ANTERIOR CERVICAL DECOMPRESSION/DISCECTOMY FUSION 1 LEVEL;  Surgeon: Ophelia Charter, MD;  Location: [facility];  Service: Neurosurgery;  Laterality: N/A;  CERVICAL THREE-FOUR anterior cervical decompression with fusion interbody prothesis plating and bonegraft  ? APPENDECTOMY  11/04/1958  ? BACK SURGERY  03/06/1999  ? BLADDER SURGERY  03/06/1975  ? through abdomen  ? BLADDER SUSPENSION   03/05/1972  ? BUNIONECTOMY  11/04/1978  ? left  ? CARDIAC CATHETERIZATION  03/05/2006  ? CATARACT EXTRACTION    ? COLONOSCOPY    ? ESOPHAGOGASTRODUODENOSCOPY    ? LUMBAR FUSION  03/06/2007  ? MOUTH SURGERY    ? growth removed inside mouth per patient  ? RECTOCELE REPAIR  03/05/1998  ? VAGINAL HYSTERECTOMY  03/05/1972  ? ?FAMILY HISTORY ?Family History  ?Problem Relation Age of Onset  ? Heart disease Mother   ? Arrhythmia Father   ? Diabetes Sister   ? Heart disease Sister   ? Blindness Brother   ? Heart attack Brother   ?  Diabetes Brother   ? ?SOCIAL HISTORY ?Social History  ? ?Tobacco Use  ? Smoking status: Every Day  ?  Packs/day: 1.00  ?  Years: 60.00  ?  Pack years: 60.00  ?  Types: Cigarettes  ? Smokeless tobacco: Never  ?Vaping Use  ? Vaping Use: Former  ?Substance Use Topics  ? Alcohol use: No  ? Drug use: No  ?  ? ?  ?OPHTHALMIC EXAM: ?Base Eye Exam   ? ? Visual Acuity (Snellen - Linear)   ? ?   Right Left  ? Dist [place] 20/25 20/30 +2  ? Dist ph [place] NI NI  ? ?  ?  ? ? Tonometry (Tonopen, 1:17 PM)   ? ?   Right Left  ? Pressure 14 14  ? ?  ?  ? ? Pupils   ? ?   Dark Light Shape React APD  ? Right 2 1 Round Minimal None  ? Left 2 1 Round Minimal None  ? ?  ?  ? ? Visual Fields (Counting fingers)   ? ?   Left Right  ?  Full Full  ? ?  ?  ? ? Extraocular Movement   ? ?   Right Left  ?  Full Full  ? ?  ?  ? ? Neuro/Psych   ? ? Oriented x3: Yes  ? Mood/Affect: Normal  ? ?  ?  ? ? Dilation   ? ? Both eyes: 1.0% Mydriacyl, 2.5% Phenylephrine @ 1:17 PM  ? ?  ?  ? ?  ? ?Slit Lamp and Fundus Exam   ? ? Slit Lamp Exam   ? ?   Right Left  ? Lids/Lashes Dermatochalasis - upper lid Dermatochalasis - upper lid  ? Conjunctiva/Sclera White and quiet White and quiet  ? Cornea arcus, trace PEE, mild central haze, well healed cataract wound arcus, 1+ infeiror PEE, mild central haze, well healed cataract wound  ? Anterior Chamber deep and clear, no cell or flare deep and clear, no cell or flare  ? Iris Round and dilated Round and  dilated  ? Lens PC IOL in good position PC IOL in good position  ? Anterior Vitreous Vitreous syneresis, no cell or pigment Vitreous syneresis, no cell or pigment  ? ?  ?  ? ? Fundus Exam   ? ?   Right Left  ?

## 2021-07-19 NOTE — Progress Notes (Signed)
Office Visit Note  Patient: [name] CroftVirginia G Obrien             Date of Birth: 02/26/1946           MRN: [id number]             PCP: Andi DevonShelton, Kimberly, MD Referring: Andi DevonShelton, Kimberly, MD Visit Date: 08/02/2021 Occupation: @GUAROCC @  Subjective:  Medication management  History of Present Illness: [name] is a 76 y.o. female with history of seronegative rheumatoid arthritis, discoid lupus and osteoarthritis.  She continues to take Enbrel on weekly basis.  She has been tolerating Enbrel without any side effects.  She notices some swelling in her right foot towards the end of the day.  None of the other joints are swollen.  She continues to have some discomfort in her neck and lower back.  She has difficulty climbing stairs and getting up from the chair due to lower back pain and generalized weakness.  She continues to have some discomfort in her feet at night due to neuropathy.  Activities of Daily Living:  Patient reports morning stiffness for 20-30 minutes.   Patient Reports nocturnal pain.  Difficulty dressing/grooming: Reports Difficulty climbing stairs: Reports Difficulty getting out of chair: Reports Difficulty using hands for taps, buttons, cutlery, and/or writing: Reports  Review of Systems  Constitutional:  Positive for fatigue.  HENT:  Positive for mouth sores. Negative for mouth dryness and nose dryness.   Eyes:  Negative for pain, itching and dryness.  Respiratory:  Positive for shortness of breath. Negative for difficulty breathing.   Cardiovascular:  Positive for chest pain and palpitations.  Gastrointestinal:  Positive for diarrhea. Negative for blood in stool and constipation.  Endocrine: Negative for increased urination.  Genitourinary:  Positive for difficulty urinating.  Musculoskeletal:  Positive for joint pain, joint pain, joint swelling, myalgias, morning stiffness, muscle tenderness and myalgias.  Skin:  Negative for color change, rash and redness.   Allergic/Immunologic: Positive for susceptible to infections.  Neurological:  Positive for weakness. Negative for dizziness, numbness, headaches and memory loss.  Hematological:  Positive for bruising/bleeding tendency.  Psychiatric/Behavioral:  Negative for confusion.    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   Gastroesophageal reflux disease without esophagitis 11/27/2019   Generalized anxiety disorder 11/27/2019   Hypothyroidism 11/27/2019   Smoker 11/27/2019   Type 2 diabetes mellitus without complication (HCC) 11/27/2019   Varicose veins of lower extremity 11/27/2019   Vitamin D deficiency 11/27/2019   Arthritis of hand 11/05/2019   Inflammation of joint of both hands 11/05/2019   Encounter for orthopedic follow-up care 01/09/2019   Tenosynovitis of fingers 09/22/2018   Tenosynovitis of right wrist 09/22/2018   Pain in right hand 09/08/2018   Nonintractable headache 08/16/2016   Carotid artery tenderness 10/26/2015   Leg pain, left 07/27/2014   Back pain of thoracolumbar region 08/11/2012   Renal cyst 08/04/2012   Cervical spondylosis with radiculopathy 01/24/2012   Chronic interstitial cystitis 11/01/2011   Mixed incontinence 11/01/2011   EMPHYSEMA 08/05/2008   LEUKOCYTOSIS 06/25/2008   COPD 06/25/2008   GASTROPARESIS 06/25/2008   DM 06/14/2008   HYPERLIPIDEMIA 06/14/2008   Essential hypertension 06/14/2008   ASTHMATIC BRONCHITIS, ACUTE 06/14/2008   ALLERGIC RHINITIS 06/14/2008   LUPUS 06/14/2008    Past Medical History:  Diagnosis Date   Arthritis    "little bit qwhere" (01/24/2012)   Asthma    Chronic  back pain    "neck to tailbone" (01/24/2012)   Chronic bronchitis (HCC)    "last time ~ 1 month ago" (01/24/2012)   COPD (chronic obstructive pulmonary disease) (HCC)    Depression    takes Zoloft daily   Fibromyalgia    History of bladder infections    sees a  urologist--on long term Macrodantin nightly   History of blood transfusion    as a child   History of blood transfusion    "when I was a child" (01/24/2012)   Hyperlipidemia    takes Simvastatin daily   Hypertension    takes Maxzide daily   Hypothyroidism    takes Synthroid daily   Insomnia    takes Ambien nightly   Lupus (HCC)    Neck pain    herniated disc and radiculopathy   Peripheral neuropathy    Pneumonia 2012   "have had it a couple times" (01/24/2012)   Smokers' cough (HCC)    has albuterol prn   Type II diabetes mellitus (HCC)    takes Janumet daily   Urinary frequency     Family History  Problem Relation Age of Onset   Heart disease Mother    Arrhythmia Father    Diabetes Sister    Heart disease Sister    Blindness Brother    Heart attack Brother    Diabetes Brother    Past Surgical History:  Procedure Laterality Date   ANTERIOR CERVICAL DECOMP/DISCECTOMY FUSION  2002; 01/24/2012   ANTERIOR CERVICAL DECOMP/DISCECTOMY FUSION  01/24/2012   Procedure: ANTERIOR CERVICAL DECOMPRESSION/DISCECTOMY FUSION 1 LEVEL;  Surgeon: Cristi Loron, MD;  Location: [facility];  Service: Neurosurgery;  Laterality: N/A;  CERVICAL THREE-FOUR anterior cervical decompression with fusion interbody prothesis plating and bonegraft   APPENDECTOMY  11/04/1958   BACK SURGERY  03/06/1999   BLADDER SURGERY  03/06/1975   through abdomen   BLADDER SUSPENSION  03/05/1972   BUNIONECTOMY  11/04/1978   left   CARDIAC CATHETERIZATION  03/05/2006   CATARACT EXTRACTION     COLONOSCOPY     ESOPHAGOGASTRODUODENOSCOPY     LUMBAR FUSION  03/06/2007   MOUTH SURGERY     growth removed inside mouth per patient   RECTOCELE REPAIR  03/05/1998   VAGINAL HYSTERECTOMY  03/05/1972   Social History   Social History Narrative   Right Handed    Lives in a one story home    Immunization History  Administered Date(s) Administered   Influenza Split 01/25/2012   Influenza, High Dose Seasonal PF  01/01/2017   Influenza-Unspecified 12/03/2012   Pneumococcal Conjugate-13 12/17/2017   Pneumococcal Polysaccharide-23 01/25/2012   Tdap 04/14/2014     Objective: Vital Signs: BP (!) 144/76 (BP Location: Left Arm, Patient Position: Sitting, Cuff Size: Normal)   Pulse 68   Ht 5\' 5"  (1.651 m)   Wt 131 lb 6.4 oz (59.6 kg)   BMI 21.87 kg/m    Physical Exam Vitals and nursing note reviewed.  Constitutional:      Appearance: She is well-developed.  HENT:     Head: Normocephalic and atraumatic.  Eyes:     Conjunctiva/sclera: Conjunctivae normal.  Cardiovascular:     Rate and Rhythm: Normal rate and regular rhythm.     Heart sounds: Normal heart sounds.  Pulmonary:     Effort: Pulmonary effort is normal.     Breath sounds: Normal breath sounds.  Abdominal:     General: Bowel sounds are normal.     Palpations: Abdomen  is soft.  Musculoskeletal:     Cervical back: Normal range of motion.  Lymphadenopathy:     Cervical: No cervical adenopathy.  Skin:    General: Skin is warm and dry.     Capillary Refill: Capillary refill takes less than 2 seconds.  Neurological:     Mental Status: She is alert and oriented to person, place, and time.  Psychiatric:        Behavior: Behavior normal.     Musculoskeletal Exam: She had limited range of motion of the cervical and the lumbar spine.  Shoulder joints, elbow joints, wrist joints with good range of motion.  She had bilateral CMC PIP and DIP thickening with no synovitis.  Mucinous cyst was noted over the left ring finger DIP joint.  Hip joints and knee joints in good range of motion.  There was no tenderness or synovitis over ankles or MTPs.  CDAI Exam: CDAI Score: 0.6  Patient Global: 4 mm; Provider Global: 2 mm Swollen: 0 ; Tender: 0  Joint Exam 08/02/2021   No joint exam has been documented for this visit   There is currently no information documented on the homunculus. Go to the Rheumatology activity and complete the homunculus  joint exam.  Investigation: No additional findings.  Imaging: OCT, Retina - OU - Both Eyes  Result Date: 07/13/2021 Right Eye Quality was good. Central Foveal Thickness: 288. Progression has been stable. Findings include normal foveal contour, no IRF, no SRF, retinal drusen , vitreomacular adhesion (Interval progression of vitreous separation from retinal surface). Left Eye Quality was good. Central Foveal Thickness: 284. Progression has been stable. Findings include normal foveal contour, no IRF, no SRF, retinal drusen , subretinal hyper-reflective material (Mild central SRHM / vitelliform like lesion, no DME, interval progression of vitreous separation from retinal surface). Notes *Images captured and stored on drive Diagnosis / Impression: NFP; no IRF/SRF OU No DME OU Interval progression of vitreous separation from retinal surface OU OS: Mild central SRHM / vitelliform like lesion, no DME Clinical management: See below Abbreviations: NFP - Normal foveal profile. CME - cystoid macular edema. PED - pigment epithelial detachment. IRF - intraretinal fluid. SRF - subretinal fluid. EZ - ellipsoid zone. ERM - epiretinal membrane. ORA - outer retinal atrophy. ORT - outer retinal tubulation. SRHM - subretinal hyper-reflective material. IRHM - intraretinal hyper-reflective material    Recent Labs: Lab Results  Component Value Date   WBC 13.8 (H) 03/03/2021   HGB 13.0 03/03/2021   PLT 328 03/03/2021   NA 137 03/03/2021   K 4.5 03/03/2021   CL 101 03/03/2021   CO2 23 03/03/2021   GLUCOSE 90 03/03/2021   BUN 15 03/03/2021   CREATININE 0.90 03/03/2021   BILITOT 0.4 03/03/2021   ALKPHOS 71 09/17/2020   AST 17 03/03/2021   ALT 12 03/03/2021   PROT 7.3 03/03/2021   ALBUMIN 3.9 09/17/2020   CALCIUM 9.3 06/07/2021   GFRAA 52 (L) 06/16/2020   QFTBGOLDPLUS NEGATIVE 06/16/2020    Speciality Comments: No specialty comments available.  Procedures:  No procedures performed Allergies: Erythromycin,  Penicillins, Gadolinium derivatives, Iodinated contrast media, Latex, Morphine, Other, Prednisone, Sulfonamide derivatives, Varenicline tartrate, Atorvastatin, and Aspirin   Assessment / Plan:     Visit Diagnoses: Seronegative rheumatoid arthritis (HCC) - Presented initially with severe inflammatory arthritis and tenosynovitis: She had been doing well on Enbrel 50 mg subcu weekly.  She is been tolerating medication well.  She states that the injections are painful.  She had no  synovitis on my examination today.  High risk medication use - Enbrel 50 mg sq injections once weekly-started on 11/05/19.  D/c MTX 0.4 ml sq wkly inj-low dose- elevated creatinine and recurrent bronchitis.  -Her labs are past 2.  She has been advised to get labs every 3 months.  Last labs were in December 2022.  TB gold was negative on June 16, 2020.  We will check labs today.  Plan: CBC with Differential/Platelet, COMPLETE METABOLIC PANEL WITH GFR, QuantiFERON-TB Gold Plus.  Information about immunization was placed in the AVS.  She was also advised to hold Enbrel in case she develops an infection.  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  I advised her to observe for right now.  If the cyst persist then she may schedule an appointment with Dr. Cheree Ditto.  Tenosynovitis of fingers - Resolved  Tenosynovitis of right wrist - Resolved  Discoid lupus - No recurrence  DDD (degenerative disc disease), cervical - Followed by Dr. Lovell Sheehan.  She continues to have some discomfort.  She had limited range of motion.  DDD (degenerative disc disease), lumbar - Dr. Lovell Sheehan: She has chronic lower back pain.  Fibromyalgia-patient believes her fibromyalgia symptoms resolved.  History of muscle spasm-she complains of some discomfort in her feet at night which she relates to neuropathy.  She denies any muscle spasms now.  History of type 2 diabetes mellitus  History of diabetic gastroparesis  Neuropathy - Patient reports having  neuropathy secondary to diabetes.   B12 deficiency  History of COPD  History of asthma  History of hyperlipidemia  Essential hypertension  History of depression  History of hypothyroidism  Smoker  Orders: Orders Placed This Encounter  Procedures   CBC with Differential/Platelet   COMPLETE METABOLIC PANEL WITH GFR   QuantiFERON-TB Gold Plus   Meds ordered this encounter  Medications   Etanercept (ENBREL MINI) 50 MG/ML SOCT    Sig: Inject 50 mg into the skin once a week.    Dispense:  12 mL    Refill:  0     Follow-Up Instructions: Return in about 5 months (around 01/02/2022) for Rheumatoid arthritis, Osteoarthritis.   Pollyann Savoy, MD  Note - This record has been created using Animal nutritionist.  Chart creation errors have been sought, but may not always  have been located. Such creation errors do not reflect on  the standard of medical care.

## 2021-08-02 ENCOUNTER — Encounter: Payer: Self-pay | Admitting: Rheumatology

## 2021-08-02 ENCOUNTER — Ambulatory Visit: Payer: Medicare Other | Admitting: Rheumatology

## 2021-08-02 VITALS — BP 144/76 | HR 68 | Ht 65.0 in | Wt 131.4 lb

## 2021-08-02 DIAGNOSIS — Z79899 Other long term (current) drug therapy: Secondary | ICD-10-CM

## 2021-08-02 DIAGNOSIS — Z8739 Personal history of other diseases of the musculoskeletal system and connective tissue: Secondary | ICD-10-CM

## 2021-08-02 DIAGNOSIS — Z8639 Personal history of other endocrine, nutritional and metabolic disease: Secondary | ICD-10-CM

## 2021-08-02 DIAGNOSIS — Z8709 Personal history of other diseases of the respiratory system: Secondary | ICD-10-CM

## 2021-08-02 DIAGNOSIS — M5136 Other intervertebral disc degeneration, lumbar region: Secondary | ICD-10-CM

## 2021-08-02 DIAGNOSIS — M659 Synovitis and tenosynovitis, unspecified: Secondary | ICD-10-CM

## 2021-08-02 DIAGNOSIS — L930 Discoid lupus erythematosus: Secondary | ICD-10-CM

## 2021-08-02 DIAGNOSIS — M797 Fibromyalgia: Secondary | ICD-10-CM

## 2021-08-02 DIAGNOSIS — E538 Deficiency of other specified B group vitamins: Secondary | ICD-10-CM

## 2021-08-02 DIAGNOSIS — M0600 Rheumatoid arthritis without rheumatoid factor, unspecified site: Secondary | ICD-10-CM

## 2021-08-02 DIAGNOSIS — G629 Polyneuropathy, unspecified: Secondary | ICD-10-CM

## 2021-08-02 DIAGNOSIS — I10 Essential (primary) hypertension: Secondary | ICD-10-CM

## 2021-08-02 DIAGNOSIS — Z8659 Personal history of other mental and behavioral disorders: Secondary | ICD-10-CM

## 2021-08-02 DIAGNOSIS — M5030 Other cervical disc degeneration, unspecified cervical region: Secondary | ICD-10-CM

## 2021-08-02 DIAGNOSIS — M67449 Ganglion, unspecified hand: Secondary | ICD-10-CM

## 2021-08-02 DIAGNOSIS — F17200 Nicotine dependence, unspecified, uncomplicated: Secondary | ICD-10-CM

## 2021-08-02 MED ORDER — ENBREL MINI 50 MG/ML ~~LOC~~ SOCT: 50.0000 mg | SUBCUTANEOUS | 0 refills | Status: DC

## 2021-08-02 NOTE — Patient Instructions (Signed)
Standing Labs We placed an order today for your standing lab work.   Please have your standing labs drawn in September and every 3 months  If possible, please have your labs drawn 2 weeks prior to your appointment so that the provider can discuss your results at your appointment.  Please note that you may see your imaging and lab results in MyChart before we have reviewed them. We may be awaiting multiple results to interpret others before contacting you. Please allow our office up to 72 hours to thoroughly review all of the results before contacting the office for clarification of your results.  We have open lab daily: Monday through Thursday from 1:30-4:30 PM and Friday from 1:30-4:00 PM at the office of Dr. Cashtyn Pouliot, [facility].   Please be advised, all patients with office appointments requiring lab work will take precedent over walk-in lab work.  If possible, please come for your lab work on Monday and Friday afternoons, as you may experience shorter wait times. The office is located at [address], [address], [place], [place] [address] No appointment is necessary.   Labs are drawn by [facility]. Please bring your co-pay at the time of your lab draw.  You may receive a bill from [facility] for your lab work.  Please note if you are on Hydroxychloroquine and and an order has been placed for a Hydroxychloroquine level, you will need to have it drawn 4 hours or more after your last dose.  If you wish to have your labs drawn at another location, please call the office 24 hours in advance to send orders.  If you have any questions regarding directions or hours of operation,  please call [phone].   As a reminder, please drink plenty of water prior to coming for your lab work. Thanks!   Vaccines You are taking a medication(s) that can suppress your immune system.  The following immunizations are recommended: Flu annually Covid-19  Td/Tdap (tetanus, diphtheria,  pertussis) every 10 years Pneumonia (Prevnar 15 then Pneumovax 23 at least 1 year apart.  Alternatively, can take Prevnar 20 without needing additional dose) Shingrix: 2 doses from 4 weeks to 6 months apart  Please check with your PCP to make sure you are up to date.   If you have signs or symptoms of an infection or start antibiotics: First, call your PCP for workup of your infection. Hold your medication through the infection, until you complete your antibiotics, and until symptoms resolve if you take the following: Injectable medication (Actemra, Benlysta, Cimzia, Cosentyx, Enbrel, Humira, Kevzara, Orencia, Remicade, Simponi, Stelara, Taltz, Tremfya) Methotrexate Leflunomide (Arava) Mycophenolate (Cellcept) Xeljanz, Olumiant, or Rinvoq  

## 2021-08-03 NOTE — Progress Notes (Signed)
CBC and CMP are normal.  White cell count is mildly elevated and stable.

## 2021-08-05 LAB — COMPLETE METABOLIC PANEL WITH GFR
AG Ratio: 1.4 (calc) (ref 1.0–2.5)
ALT: 8 U/L (ref 6–29)
AST: 14 U/L (ref 10–35)
Albumin: 4.2 g/dL (ref 3.6–5.1)
Alkaline phosphatase (APISO): 58 U/L (ref 37–153)
BUN: 14 mg/dL (ref 7–25)
CO2: 26 mmol/L (ref 20–32)
Calcium: 9.3 mg/dL (ref 8.6–10.4)
Chloride: 100 mmol/L (ref 98–110)
Creat: 0.97 mg/dL (ref 0.60–1.00)
Globulin: 3.1 g/dL (calc) (ref 1.9–3.7)
Glucose, Bld: 86 mg/dL (ref 65–99)
Potassium: 4.4 mmol/L (ref 3.5–5.3)
Sodium: 137 mmol/L (ref 135–146)
Total Bilirubin: 0.6 mg/dL (ref 0.2–1.2)
Total Protein: 7.3 g/dL (ref 6.1–8.1)
eGFR: 61 mL/min/{1.73_m2} (ref >=60)

## 2021-08-05 LAB — CBC WITH DIFFERENTIAL/PLATELET
Absolute Monocytes: 706 cells/uL (ref 200–950)
Basophils Absolute: 112 cells/uL (ref 0–200)
Basophils Relative: 1 %
Eosinophils Absolute: 392 cells/uL (ref 15–500)
Eosinophils Relative: 3.5 %
HCT: 40.8 % (ref 35.0–45.0)
Hemoglobin: 13.7 g/dL (ref 11.7–15.5)
Lymphs Abs: 1837 cells/uL (ref 850–3900)
MCH: 30.1 pg (ref 27.0–33.0)
MCHC: 33.6 g/dL (ref 32.0–36.0)
MCV: 89.7 fL (ref 80.0–100.0)
MPV: 9.8 fL (ref 7.5–12.5)
Monocytes Relative: 6.3 %
Neutro Abs: 8154 cells/uL — ABNORMAL HIGH (ref 1500–7800)
Neutrophils Relative %: 72.8 %
Platelets: 255 10*3/uL (ref 140–400)
RBC: 4.55 10*6/uL (ref 3.80–5.10)
RDW: 13.3 % (ref 11.0–15.0)
Total Lymphocyte: 16.4 %
WBC: 11.2 10*3/uL — ABNORMAL HIGH (ref 3.8–10.8)

## 2021-08-05 LAB — QUANTIFERON-TB GOLD PLUS
Mitogen-NIL: >10 IU/mL
NIL: 0.05 IU/mL
QuantiFERON-TB Gold Plus: NEGATIVE
TB1-NIL: 0 IU/mL
TB2-NIL: <0 IU/mL

## 2021-08-06 NOTE — Progress Notes (Signed)
TB Gold is negative.

## 2021-08-08 ENCOUNTER — Encounter: Payer: Self-pay | Admitting: Cardiology

## 2021-08-08 NOTE — Progress Notes (Unsigned)
Cardiology Office Note   Date:  08/09/2021   ID:  [name], DOB 1946-02-14, MRN [id number]  PCP:  Willey Blade, MD  Cardiologist:   Kate Sable, MD (Inactive) Referring:  Willey Blade, MD  Chief Complaint  Patient presents with   Chest Pain      History of Present Illness: [name] is a 76 y.o. female who presents for evaluation of chest pain.  She was seen by Dr. Bronson Ing in 2020.  She had chest pain and HTN.  She had a low risk perfusion study in 2017.  She reports a distant history of a cardiac cath.    I take care of her husband who was being treated for cancer.  She has been under stress from this.  He also has dementia.  She was getting ready to take him to the cancer center in April.  She was not doing anything particularly exerting but she was moving around to get him ready and out the door when she had chest discomfort.  This was substernal.  It was into her jaw.  It was 5 out of 10 in intensity.  She is a little bit sweaty with it.  It came and went spontaneously.  She had another episode of it again in April.  She has not had any in May.  She has not had this kind of discomfort before.  She has not had any nausea vomiting or diaphoresis.  She is not any palpitations, presyncope or syncope.  Of note she did have some nitroglycerin that she took both times and it improved.  She otherwise has not had symptoms.  She is not able to do as much as she would like although she does the household chores.  She is limited by back and joint pain.   Past Medical History:  Diagnosis Date   Arthritis    "little bit qwhere" (01/24/2012)   Asthma    Chronic back pain    "neck to tailbone" (01/24/2012)   COPD (chronic obstructive pulmonary disease) ([place])    Depression    takes Zoloft daily   Fibromyalgia    History of bladder infections    sees a urologist--on long term Macrodantin nightly   History of blood transfusion    "when I was a child"  (01/24/2012)   Hyperlipidemia    takes Simvastatin daily   Hypertension    takes Maxzide daily   Hypothyroidism    takes Synthroid daily   Insomnia    takes Ambien nightly   Lupus ([place])    Neck pain    herniated disc and radiculopathy   Peripheral neuropathy    Pneumonia 03/05/2010   "have had it a couple times" (01/24/2012)   Type II diabetes mellitus ([place])    takes Janumet daily    Past Surgical History:  Procedure Laterality Date   ANTERIOR CERVICAL DECOMP/DISCECTOMY FUSION  2002; 01/24/2012   ANTERIOR CERVICAL DECOMP/DISCECTOMY FUSION  01/24/2012   Procedure: ANTERIOR CERVICAL DECOMPRESSION/DISCECTOMY FUSION 1 LEVEL;  Surgeon: Ophelia Charter, MD;  Location: [facility];  Service: Neurosurgery;  Laterality: N/A;  CERVICAL THREE-FOUR anterior cervical decompression with fusion interbody prothesis plating and bonegraft   APPENDECTOMY  11/04/1958   BACK SURGERY  03/06/1999   BLADDER SURGERY  03/06/1975   through abdomen   BLADDER SUSPENSION  03/05/1972   BUNIONECTOMY  11/04/1978   left   CARDIAC CATHETERIZATION  03/05/2006   CATARACT EXTRACTION     COLONOSCOPY  ESOPHAGOGASTRODUODENOSCOPY     LUMBAR FUSION  03/06/2007   MOUTH SURGERY     growth removed inside mouth per patient   RECTOCELE REPAIR  03/05/1998   VAGINAL HYSTERECTOMY  03/05/1972     Current Outpatient Medications  Medication Sig Dispense Refill   albuterol (VENTOLIN HFA) 108 (90 Base) MCG/ACT inhaler as needed.     BREZTRI AEROSPHERE 160-9-4.8 MCG/ACT AERO 2 (two) times daily.     Cyanocobalamin (B-12 COMPLIANCE INJECTION) 1000 MCG/ML KIT Vitamin B12 1095mg IM injection daily for 7 days, then weekly for 4 weeks, then monthly thereafter for 1 year. 1 kit 0   donepezil (ARICEPT) 5 MG tablet Take 5 mg by mouth at bedtime.     Etanercept (ENBREL MINI) 50 MG/ML SOCT Inject 50 mg into the skin once a week. 12 mL 0   insulin glargine, 1 Unit Dial, (TOUJEO SOLOSTAR) 300 UNIT/ML Solostar Pen 35 units in  the morning and 50 units at bedtime.     levothyroxine (SYNTHROID, LEVOTHROID) 50 MCG tablet Take 50 mcg by mouth daily before breakfast.     metFORMIN (GLUCOPHAGE) 1000 MG tablet Take 1,000 mg by mouth 2 (two) times daily with a meal.     metoprolol tartrate (LOPRESSOR) 100 MG tablet Take 1 tablet (100 mg total) by mouth as directed. Take 1 tablet 2 hours before your CT scan 1 tablet 0   nitroGLYCERIN (NITROSTAT) 0.4 MG SL tablet Place 1 tablet (0.4 mg total) under the tongue every 5 (five) minutes x 3 doses as needed for chest pain. If no relief after 3rd dose, proceed to the ED for an evaluation 25 tablet 3   ONETOUCH VERIO test strip 1 each 4 (four) times daily.     predniSONE (DELTASONE) 50 MG tablet Take one tab 13 hrs before, one tab 7 hrs before and one tab 1 hour before your CT scan 3 tablet 0   sertraline (ZOLOFT) 100 MG tablet Take 100 mg by mouth daily.      triamterene-hydrochlorothiazide (MAXZIDE-25) 37.5-25 MG tablet Take 1 tablet by mouth daily.     VITAMIN D PO Take by mouth daily.     zolpidem (AMBIEN) 5 MG tablet Take 5 mg by mouth at bedtime. For sleep     MAGNESIUM PO Take by mouth daily. (Patient not taking: Reported on 03/03/2021)     tiZANidine (ZANAFLEX) 2 MG tablet Start 1 tablet at bedtime x 3 days, then increase to 1 tablet twice daily (Patient not taking: Reported on 08/09/2021) 60 tablet 5   No current facility-administered medications for this visit.    Allergies:   Erythromycin, Penicillins, Gadolinium derivatives, Iodinated contrast media, Latex, Morphine, Other, Prednisone, Sulfonamide derivatives, Varenicline tartrate, Atorvastatin, and Aspirin    Social History:  The patient  reports that she has been smoking cigarettes. She has a 60.00 pack-year smoking history. She has never been exposed to tobacco smoke. She has never used smokeless tobacco. She reports that she does not drink alcohol and does not use drugs.   Family History:  The patient's family history  includes Arrhythmia in her father; Blindness in her brother; Diabetes in her brother and sister; Heart attack in her brother; Heart disease in her sister; Heart disease (age of onset: 613 in her mother.    ROS:  Please see the history of present illness.   Otherwise, review of systems are positive for none.   All other systems are reviewed and negative.    PHYSICAL EXAM: VS:  BP 138/68  Pulse 77   Ht 5' 5"  (1.651 m)   Wt 131 lb 9.6 oz (59.7 kg)   SpO2 95%   BMI 21.90 kg/m  , BMI Body mass index is 21.9 kg/m. GENERAL:  Well appearing HEENT:  Pupils equal round and reactive, fundi not visualized, oral mucosa unremarkable NECK:  No jugular venous distention, waveform within normal limits, carotid upstroke brisk and symmetric, no bruits, no thyromegaly LYMPHATICS:  No cervical, inguinal adenopathy LUNGS:  Clear to auscultation bilaterally BACK:  No CVA tenderness CHEST:  Unremarkable HEART:  PMI not displaced or sustained,S1 and S2 within normal limits, no S3, no S4, no clicks, no rubs, no murmurs ABD:  Flat, positive bowel sounds normal in frequency in pitch, no bruits, no rebound, no guarding, no midline pulsatile mass, no hepatomegaly, no splenomegaly EXT:  2 plus pulses throughout, no edema, no cyanosis no clubbing SKIN:  No rashes no nodules NEURO:  Cranial nerves II through XII grossly intact, motor grossly intact throughout PSYCH:  Cognitively intact, oriented to person place and time    EKG:  EKG is ordered today. The ekg ordered today demonstrates sinus rhythm, rate 71, axis within normal limits, intervals within normal limits, deep T wave inversion in the anterior leads new compared to previous, prolonged QT   Recent Labs: 08/02/2021: ALT 8; BUN 14; Creat 0.97; Hemoglobin 13.7; Platelets 255; Potassium 4.4; Sodium 137    Lipid Panel    Component Value Date/Time   CHOL (H) 09/07/2006 0605    271        ATP III CLASSIFICATION:  <200     mg/dL   Desirable  200-239   mg/dL   Borderline High  >=240    mg/dL   High   TRIG 300 (H) 09/07/2006 0605   HDL 31 (L) 09/07/2006 0605   CHOLHDL 8.7 09/07/2006 0605   VLDL 60 (H) 09/07/2006 0605   LDLCALC (H) 09/07/2006 0605    180        Total Cholesterol/HDL:CHD Risk Coronary Heart Disease Risk Table                     Men   Women  1/2 Average Risk   3.4   3.3      Wt Readings from Last 3 Encounters:  08/09/21 131 lb 9.6 oz (59.7 kg)  08/02/21 131 lb 6.4 oz (59.6 kg)  06/07/21 135 lb (61.2 kg)      Other studies Reviewed: Additional studies/ records that were reviewed today include: Labs. Review of the above records demonstrates:  Please see elsewhere in the note.     ASSESSMENT AND PLAN:  Chest pain:   Her chest discomfort is worrisome for unstable angina.  I am going to start with a coronary CTA.  She has a contrast allergy and so she will need prophylaxis.   Abnormal EKG: This is an appearance consistent with possible Takotsubo's.  She will have evaluation as above and an echocardiogram.  HTN:   Blood pressures currently controlled.  No change in therapy.   Type 2 diabetes: I do not have the most recent A1c but she says it is well controlled.  She had some problems and had her meds adjusted last year.  She had this followed closely by her providers.  Statin intolerance: She said she has had some genetic testing and she cannot take statins.  Current medicines are reviewed at length with the patient today.  The patient does not have concerns regarding  medicines.  The following changes have been made:  no change  Labs/ tests ordered today include:   Orders Placed This Encounter  Procedures   CT CORONARY MORPH W/CTA COR W/SCORE W/CA W/CM &/OR WO/CM   Basic metabolic panel   EKG 11-WAQL   ECHOCARDIOGRAM COMPLETE     Disposition:   FU with me after the above testing   Signed, Minus Breeding, MD  08/09/2021 3:50 PM    [facility]

## 2021-08-09 ENCOUNTER — Ambulatory Visit: Payer: Medicare Other | Admitting: Cardiology

## 2021-08-09 ENCOUNTER — Encounter: Payer: Self-pay | Admitting: Cardiology

## 2021-08-09 ENCOUNTER — Other Ambulatory Visit: Payer: Self-pay | Admitting: *Deleted

## 2021-08-09 VITALS — BP 138/68 | HR 77 | Ht 65.0 in | Wt 131.6 lb

## 2021-08-09 DIAGNOSIS — R072 Precordial pain: Secondary | ICD-10-CM | POA: Diagnosis not present

## 2021-08-09 DIAGNOSIS — E118 Type 2 diabetes mellitus with unspecified complications: Secondary | ICD-10-CM

## 2021-08-09 DIAGNOSIS — I10 Essential (primary) hypertension: Secondary | ICD-10-CM

## 2021-08-09 DIAGNOSIS — Z01812 Encounter for preprocedural laboratory examination: Secondary | ICD-10-CM

## 2021-08-09 DIAGNOSIS — R9431 Abnormal electrocardiogram [ECG] [EKG]: Secondary | ICD-10-CM

## 2021-08-09 MED ORDER — PREDNISONE 50 MG PO TABS: ORAL_TABLET | ORAL | 0 refills | Status: DC

## 2021-08-09 MED ORDER — NITROGLYCERIN 0.4 MG SL SUBL: 0.4000 mg | SUBLINGUAL_TABLET | SUBLINGUAL | 3 refills | Status: DC | PRN

## 2021-08-09 MED ORDER — METOPROLOL TARTRATE 100 MG PO TABS: 100.0000 mg | ORAL_TABLET | ORAL | 0 refills | Status: DC

## 2021-08-09 NOTE — Addendum Note (Signed)
Addended by: Shellia Cleverly on: 08/09/2021 03:53 PM   Modules accepted: Orders

## 2021-08-09 NOTE — Patient Instructions (Addendum)
Medication Instructions:  The current medical regimen is effective;  continue present plan and medications.  *If you need a refill on your cardiac medications before your next appointment, please call your pharmacy*   Lab Work: Please have blood work prior to your CT scan. (BMP)  If you have labs (blood work) drawn today and your tests are completely normal, you will receive your results only by: MyChart Message (if you have MyChart) OR A paper copy in the mail If you have any lab test that is abnormal or we need to change your treatment, we will call you to review the results.   Testing/Procedures:   Your cardiac CT will be scheduled at:   [facility] [address] [name], [name] [address] [phone]  Please arrive at the [facility] and Children's Entrance (Entrance C2) of [name] 30 minutes prior to test start time. You can use the FREE valet parking offered at entrance C (encouraged to control the heart rate for the test)  Proceed to the [facility] Radiology Department (first floor) to check-in and test prep.  All radiology patients and guests should use entrance C2 at [facility], accessed from [facility], even though the hospital's physical address listed is [address].    Please follow these instructions carefully (unless otherwise directed):  On the Night Before the Test: Be sure to Drink plenty of water. Do not consume any caffeinated/decaffeinated beverages or chocolate 12 hours prior to your test. Do not take any antihistamines 12 hours prior to your test. If the patient has contrast allergy: Patient will need a prescription for Prednisone and very clear instructions (as follows): Prednisone 50 mg - take 13 hours prior to test Take another Prednisone 50 mg 7 hours prior to test Take another Prednisone 50 mg 1 hour prior to test Take Benadryl 50 mg 1 hour prior to test Patient must complete all four  doses of above prophylactic medications. Patient will need a ride after test due to Benadryl.  On the Day of the Test: Drink plenty of water until 1 hour prior to the test. Do not eat any food 4 hours prior to the test. You may take your regular medications prior to the test.  Take metoprolol (Lopressor) two hours prior to test. HOLD Furosemide/Hydrochlorothiazide morning of the test. FEMALES- please wear underwire-free bra if available, avoid dresses & tight clothing      After the Test: Drink plenty of water. After receiving IV contrast, you may experience a mild flushed feeling. This is normal. On occasion, you may experience a mild rash up to 24 hours after the test. This is not dangerous. If this occurs, you can take Benadryl 25 mg and increase your fluid intake. If you experience trouble breathing, this can be serious. If it is severe call 911 IMMEDIATELY. If it is mild, please call our office. If you take any of these medications: Glipizide/Metformin, Avandament, Glucavance, please do not take 48 hours after completing test unless otherwise instructed.  We will call to schedule your test 2-4 weeks out understanding that some insurance companies will need an authorization prior to the service being performed.   For non-scheduling related questions, please contact the cardiac imaging nurse navigator should you have any questions/concerns: Rockwell Alexandria, Cardiac Imaging Nurse Navigator Larey Brick, Cardiac Imaging Nurse Navigator [facility] Direct Office Dial: [phone]   For scheduling needs, including cancellations and rescheduling, please call [place], [phone].  Your physician has requested that you have an echocardiogram. Echocardiography is a painless test that uses sound waves to create images of your heart. It provides your doctor with information about the size and shape of your heart and how well your heart's chambers and valves are  working. This procedure takes approximately one hour. There are no restrictions for this procedure. You will be contacted to be scheduled for this testing.  Follow-Up: At [facility], you and your health needs are our priority.  As part of our continuing mission to provide you with exceptional heart care, we have created designated Provider Care Teams.  These Care Teams include your primary Cardiologist (physician) and Advanced Practice Providers (APPs -  Physician Assistants and Nurse Practitioners) who all work together to provide you with the care you need, when you need it.  We recommend signing up for the patient portal called "MyChart".  Sign up information is provided on this After Visit Summary.  MyChart is used to connect with patients for Virtual Visits (Telemedicine).  Patients are able to view lab/test results, encounter notes, upcoming appointments, etc.  Non-urgent messages can be sent to your provider as well.   To learn more about what you can do with MyChart, go to [email or web address].    Your next appointment:   Follow up will be determined after the above testing.   Important Information About Sugar

## 2021-08-11 ENCOUNTER — Telehealth (HOSPITAL_COMMUNITY): Payer: Self-pay | Admitting: *Deleted

## 2021-08-11 NOTE — Telephone Encounter (Signed)
Patient returning call regarding upcoming cardiac imaging study; pt verbalizes understanding of appt date/time, parking situation and where to check in, pre-test NPO status and medications ordered, and verified current allergies; name and call back number provided for further questions should they arise  Gordy Clement RN Navigator Cardiac Imaging [facility] [phone] office [phone] cell  Patient states she has no breakthrough reaction with 13 hour prep since her incident in 2015. She since has had CT scan with contrast with prep without incident.  She verbalizes how to take 13 hour prep and metoprolol tartrate for test and to arrive at 7:30am.  Reviewed case with CT and they are ok to proceed with 13 hour prep.

## 2021-08-11 NOTE — Telephone Encounter (Signed)
Attempted to call patient regarding upcoming cardiac CT appointment. °Left message on voicemail with name and callback number ° °Enora Trillo RN Navigator Cardiac Imaging °[facility] °[phone] Office °[phone] Cell ° °

## 2021-08-12 LAB — BASIC METABOLIC PANEL
BUN/Creatinine Ratio: 18 (ref 12–28)
BUN: 17 mg/dL (ref 8–27)
CO2: 23 mmol/L (ref 20–29)
Calcium: 9.6 mg/dL (ref 8.7–10.3)
Chloride: 99 mmol/L (ref 96–106)
Creatinine, Ser: 0.97 mg/dL (ref 0.57–1.00)
Glucose: 125 mg/dL — ABNORMAL HIGH (ref 70–99)
Potassium: 4.1 mmol/L (ref 3.5–5.2)
Sodium: 136 mmol/L (ref 134–144)
eGFR: 61 mL/min/{1.73_m2} (ref >59)

## 2021-08-15 ENCOUNTER — Other Ambulatory Visit: Payer: Self-pay | Admitting: Cardiology

## 2021-08-15 ENCOUNTER — Ambulatory Visit (HOSPITAL_COMMUNITY)
Admission: RE | Admit: 2021-08-15 | Discharge: 2021-08-15 | Disposition: A | Discharge status: Home / Self Care | Payer: Medicare Other | Source: Ambulatory Visit | Attending: Cardiology | Admitting: Cardiology

## 2021-08-15 DIAGNOSIS — R072 Precordial pain: Secondary | ICD-10-CM | POA: Insufficient documentation

## 2021-08-15 DIAGNOSIS — R931 Abnormal findings on diagnostic imaging of heart and coronary circulation: Secondary | ICD-10-CM

## 2021-08-15 DIAGNOSIS — I2510 Atherosclerotic heart disease of native coronary artery without angina pectoris: Secondary | ICD-10-CM | POA: Diagnosis not present

## 2021-08-15 MED ORDER — NITROGLYCERIN 0.4 MG SL SUBL
0.8000 mg | SUBLINGUAL_TABLET | Freq: Once | SUBLINGUAL | Status: AC
  Administered 2021-08-15: 0.8 mg via SUBLINGUAL

## 2021-08-15 MED ORDER — NITROGLYCERIN 0.4 MG SL SUBL
SUBLINGUAL_TABLET | SUBLINGUAL | Status: AC
  Filled 2021-08-15: qty 2

## 2021-08-15 MED ORDER — IOHEXOL 350 MG/ML SOLN
100.0000 mL | Freq: Once | INTRAVENOUS | Status: AC | PRN
  Administered 2021-08-15: 100 mL via INTRAVENOUS

## 2021-08-16 ENCOUNTER — Ambulatory Visit (HOSPITAL_COMMUNITY)
Admission: RE | Admit: 2021-08-16 | Discharge: 2021-08-16 | Disposition: A | Discharge status: Home / Self Care | Payer: Medicare Other | Source: Ambulatory Visit | Attending: Cardiology | Admitting: Cardiology

## 2021-08-16 DIAGNOSIS — R931 Abnormal findings on diagnostic imaging of heart and coronary circulation: Secondary | ICD-10-CM

## 2021-08-22 ENCOUNTER — Ambulatory Visit (HOSPITAL_COMMUNITY)
Admission: RE | Admit: 2021-08-22 | Discharge: 2021-08-22 | Disposition: A | Discharge status: Home / Self Care | Payer: Medicare Other | Source: Ambulatory Visit | Attending: Cardiology | Admitting: Cardiology

## 2021-08-22 DIAGNOSIS — I10 Essential (primary) hypertension: Secondary | ICD-10-CM | POA: Diagnosis not present

## 2021-08-22 DIAGNOSIS — R072 Precordial pain: Secondary | ICD-10-CM | POA: Diagnosis not present

## 2021-08-22 DIAGNOSIS — R9431 Abnormal electrocardiogram [ECG] [EKG]: Secondary | ICD-10-CM | POA: Diagnosis not present

## 2021-08-22 LAB — ECHOCARDIOGRAM COMPLETE
Area-P 1/2: 3.12 cm2
S' Lateral: 2.8 cm

## 2021-08-22 NOTE — Progress Notes (Signed)
*  PRELIMINARY RESULTS* Echocardiogram 2D Echocardiogram has been performed.  Stacey Drain 08/22/2021, 10:14 AM

## 2021-08-29 NOTE — H&P (View-Only) (Signed)
Cardiology Office Note   Date:  08/30/2021   ID:  MONTANNA MCBAIN, DOB [birth year], MRN [id number]  PCP:  Willey Blade, MD  Cardiologist:   Kate Sable, MD (Inactive) Referring:  Willey Blade, MD  Chief Complaint  Patient presents with   Chest Pain      History of Present Illness: [name] is a 76 y.o. female who presents for evaluation of chest pain.  When I saw her recently she had a markedly abnormal EKG.  She had a cath with NL EF.  There were no significant valve abnormalities.   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   She returns today to discuss this.  She has continued to get discomfort.  This has been severe at times.  Its not always reproducible with activity.  She can drag garbage cans down to the road and she might get this discomfort.  However, its not always reproducible.  She has had some episodes of rest.  She thinks is happening about every day and she has had some severe episodes where she is taken up to 3 nitroglycerin and it helps.  She describes a dull pressure discomfort in her mid chest.  It is moderately severe 5 out of 10 in intensity.  She is not describing nausea vomiting or diaphoresis.  He is not having any new shortness of breath, PND or orthopnea.  He is not having any palpitations, presyncope or syncope.  She is a little bit limited by back and joint pain.  She has take care of her husband who has advanced dementia.    Past Medical History:  Diagnosis Date   Arthritis    "little bit qwhere" (01/24/2012)   Asthma    Chronic back pain    "neck to tailbone" (01/24/2012)   COPD (chronic obstructive pulmonary disease) ([place])    Depression    takes Zoloft daily   Fibromyalgia    History of bladder infections    sees a urologist--on long term Macrodantin nightly   History of blood transfusion    "when I was a child" (01/24/2012)   Hyperlipidemia    takes Simvastatin daily   Hypertension    takes  Maxzide daily   Hypothyroidism    takes Synthroid daily   Insomnia    takes Ambien nightly   Lupus ([place])    Neck pain    herniated disc and radiculopathy   Peripheral neuropathy    Pneumonia 03/05/2010   "have had it a couple times" (01/24/2012)   Type II diabetes mellitus ([place])    takes Janumet daily    Past Surgical History:  Procedure Laterality Date   ANTERIOR CERVICAL DECOMP/DISCECTOMY FUSION  2002; 01/24/2012   ANTERIOR CERVICAL DECOMP/DISCECTOMY FUSION  01/24/2012   Procedure: ANTERIOR CERVICAL DECOMPRESSION/DISCECTOMY FUSION 1 LEVEL;  Surgeon: Ophelia Charter, MD;  Location: [facility];  Service: Neurosurgery;  Laterality: N/A;  CERVICAL THREE-FOUR anterior cervical decompression with fusion interbody prothesis plating and bonegraft   APPENDECTOMY  11/04/1958   BACK SURGERY  03/06/1999   BLADDER SURGERY  03/06/1975   through abdomen   BLADDER SUSPENSION  03/05/1972   BUNIONECTOMY  11/04/1978   left   CARDIAC CATHETERIZATION  03/05/2006   CATARACT EXTRACTION     COLONOSCOPY     ESOPHAGOGASTRODUODENOSCOPY     LUMBAR FUSION  03/06/2007   MOUTH SURGERY     growth removed inside mouth per patient   [facility]  03/05/1998  VAGINAL HYSTERECTOMY  03/05/1972     Current Outpatient Medications  Medication Sig Dispense Refill   albuterol (VENTOLIN HFA) 108 (90 Base) MCG/ACT inhaler as needed.     BREZTRI AEROSPHERE 160-9-4.8 MCG/ACT AERO 2 (two) times daily.     clopidogrel (PLAVIX) 75 MG tablet Take 1 tablet (75 mg total) by mouth daily. 90 tablet 3   Cyanocobalamin (B-12 COMPLIANCE INJECTION) 1000 MCG/ML KIT Vitamin B12 1077mg IM injection daily for 7 days, then weekly for 4 weeks, then monthly thereafter for 1 year. 1 kit 0   donepezil (ARICEPT) 5 MG tablet Take 5 mg by mouth at bedtime.     Etanercept (ENBREL MINI) 50 MG/ML SOCT Inject 50 mg into the skin once a week. 12 mL 0   insulin glargine, 1 Unit Dial, (TOUJEO SOLOSTAR) 300 UNIT/ML Solostar Pen 35  units in the morning and 50 units at bedtime.     isosorbide mononitrate (IMDUR) 30 MG 24 hr tablet Take 1 tablet (30 mg total) by mouth daily. 90 tablet 3   levothyroxine (SYNTHROID, LEVOTHROID) 50 MCG tablet Take 50 mcg by mouth daily before breakfast.     metFORMIN (GLUCOPHAGE) 1000 MG tablet Take 1,000 mg by mouth 2 (two) times daily with a meal.     nitroGLYCERIN (NITROSTAT) 0.4 MG SL tablet Place 1 tablet (0.4 mg total) under the tongue every 5 (five) minutes x 3 doses as needed for chest pain. If no relief after 3rd dose, proceed to the ED for an evaluation 25 tablet 3   ONETOUCH VERIO test strip 1 each 4 (four) times daily.     predniSONE (DELTASONE) 50 MG tablet Take (1) tab 13 hrs before, (1) tab 7 hrs before and (1) tab 1 hr before 3 tablet 0   sertraline (ZOLOFT) 100 MG tablet Take 100 mg by mouth daily.      tiZANidine (ZANAFLEX) 2 MG tablet Start 1 tablet at bedtime x 3 days, then increase to 1 tablet twice daily 60 tablet 5   triamterene-hydrochlorothiazide (MAXZIDE-25) 37.5-25 MG tablet Take 1 tablet by mouth daily.     VITAMIN D PO Take by mouth daily.     zolpidem (AMBIEN) 5 MG tablet Take 5 mg by mouth at bedtime. For sleep     ALPRAZolam (XANAX) 0.25 MG tablet Take 0.25 mg by mouth 2 (two) times daily as needed.     No current facility-administered medications for this visit.    Allergies:   Erythromycin, Penicillins, Gadolinium derivatives, Iodinated contrast media, Latex, Morphine, Other, Prednisone, Sulfonamide derivatives, Varenicline tartrate, Aspirin, and Atorvastatin    Social History:  The patient  reports that she has been smoking cigarettes. She has a 60.00 pack-year smoking history. She has never been exposed to tobacco smoke. She has never used smokeless tobacco. She reports that she does not drink alcohol and does not use drugs.   Family History:  The patient's family history includes Arrhythmia in her father; Blindness in her brother; Diabetes in her brother  and sister; Heart attack in her brother; Heart disease in her sister; Heart disease (age of onset: 628 in her mother.    ROS:  Please see the history of present illness.   Otherwise, review of systems are positive for none.   All other systems are reviewed and negative.    PHYSICAL EXAM: VS:  BP (!) 151/64   Pulse (!) 57   Ht _0  (1.651 m)   Wt 133 lb (60.3 kg)   SpO2 99%  BMI 22.13 kg/m  , BMI Body mass index is 22.13 kg/m. GENERAL:  Well appearing HEENT:  Pupils equal round and reactive, fundi not visualized, oral mucosa unremarkable NECK:  No jugular venous distention, waveform within normal limits, carotid upstroke brisk and symmetric, no bruits, no thyromegaly LYMPHATICS:  No cervical, inguinal adenopathy LUNGS:  Clear to auscultation bilaterally BACK:  No CVA tenderness CHEST:  Unremarkable HEART:  PMI not displaced or sustained,S1 and S2 within normal limits, no S3, no S4, no clicks, no rubs, no murmurs ABD:  Flat, positive bowel sounds normal in frequency in pitch, no bruits, no rebound, no guarding, no midline pulsatile mass, no hepatomegaly, no splenomegaly EXT:  2 plus pulses throughout, no edema, no cyanosis no clubbing SKIN:  No rashes no nodules NEURO:  Cranial nerves II through XII grossly intact, motor grossly intact throughout PSYCH:  Cognitively intact, oriented to person place and time    EKG:  EKG is ordered today. The ekg ordered today demonstrates sinus rhythm, rate 59, axis within normal limits, intervals nonspecific T wave changes improved from previous, QT has shortened  Recent Labs: 08/02/2021: ALT 8; Hemoglobin 13.7; Platelets 255 08/11/2021: BUN 17; Creatinine, Ser 0.97; Potassium 4.1; Sodium 136    Lipid Panel    Component Value Date/Time   CHOL (H) 09/07/2006 0605    271        ATP III CLASSIFICATION:  <200     mg/dL   Desirable  200-239  mg/dL   Borderline High  >=240    mg/dL   High   TRIG 300 (H) 09/07/2006 0605   HDL 31 (L) 09/07/2006  0605   CHOLHDL 8.7 09/07/2006 0605   VLDL 60 (H) 09/07/2006 0605   LDLCALC (H) 09/07/2006 0605    180        Total Cholesterol/HDL:CHD Risk Coronary Heart Disease Risk Table                     Men   Women  1/2 Average Risk   3.4   3.3      Wt Readings from Last 3 Encounters:  08/30/21 133 lb (60.3 kg)  08/09/21 131 lb 9.6 oz (59.7 kg)  08/02/21 131 lb 6.4 oz (59.6 kg)     CT  1. Left Main: No significant stenosis: LM FFR =0.9.   2. LAD: No hemodynamically significant lesion: Proximal FFR = 0.97, Mid FFR = 0.89, Distal FFR = 0.81.   3. LCX: Possible hemodynamically significant lesion distally: Proximal FFR = 0.98, Mid FFR = 0.97, Distal FFR = 0.79.   4. RCA: No hemodynamically significant lesion: Proximal FFR = 0.97, Mid FFR = 0.93, Distal FFR =0.83.   Other studies Reviewed: Additional studies/ records that were reviewed today include: Labs, CT, echo Review of the above records demonstrates:  Please see elsewhere in the note.     ASSESSMENT AND PLAN:  Chest pain:   Her chest discomfort is consistent with unstable angina significant symptoms.  Given the severity of the symptoms coupled with a markedly abnormal EKG I would suggest cardiac catheterization.   The patient understands that risks included but are not limited to stroke (1 in 1000), death (1 in 50), kidney failure [usually temporary] (1 in 500), bleeding (1 in 200), allergic reaction [possibly serious] (1 in 200).  The patient understands and agrees to proceed.  She will need diet prophylaxis.  She cannot tolerate aspirin so we will give her Plavix.  She has not been  loaded.  Abnormal EKG: The T wave changes are improved compared to previous.  This will be evaluated as above.  HTN:   The blood pressure is mildly elevated.  I am going to start Imdur 30 mg daily.  She will need other med titration but she is hesitant to take more medicines.   Type 2 diabetes: This is followed by her primary provider.  I do not  see a recent A1c.  Statin intolerance: She had some genetic testing and is intolerant of statins.  I am going to see eventually phytotoxin with PCSK9 inhibitors.  Current medicines are reviewed at length with the patient today.  The patient does not have concerns regarding medicines.  The following changes have been made: As above  Labs/ tests ordered today include:       Orders Placed This Encounter  Procedures   EKG 12-Lead     Disposition:   FU with me after the cath  Signed, Minus Breeding, MD  08/30/2021 11:57 AM    [place]

## 2021-08-29 NOTE — Progress Notes (Signed)
Cardiology Office Note   Date:  08/30/2021   ID:  MONTANNA MCBAIN, DOB [birth year], MRN [id number]  PCP:  Willey Blade, MD  Cardiologist:   Kate Sable, MD (Inactive) Referring:  Willey Blade, MD  Chief Complaint  Patient presents with   Chest Pain      History of Present Illness: [name] is a 76 y.o. female who presents for evaluation of chest pain.  When I saw her recently she had a markedly abnormal EKG.  She had a cath with NL EF.  There were no significant valve abnormalities.   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   She returns today to discuss this.  She has continued to get discomfort.  This has been severe at times.  Its not always reproducible with activity.  She can drag garbage cans down to the road and she might get this discomfort.  However, its not always reproducible.  She has had some episodes of rest.  She thinks is happening about every day and she has had some severe episodes where she is taken up to 3 nitroglycerin and it helps.  She describes a dull pressure discomfort in her mid chest.  It is moderately severe 5 out of 10 in intensity.  She is not describing nausea vomiting or diaphoresis.  He is not having any new shortness of breath, PND or orthopnea.  He is not having any palpitations, presyncope or syncope.  She is a little bit limited by back and joint pain.  She has take care of her husband who has advanced dementia.    Past Medical History:  Diagnosis Date   Arthritis    "little bit qwhere" (01/24/2012)   Asthma    Chronic back pain    "neck to tailbone" (01/24/2012)   COPD (chronic obstructive pulmonary disease) ([place])    Depression    takes Zoloft daily   Fibromyalgia    History of bladder infections    sees a urologist--on long term Macrodantin nightly   History of blood transfusion    "when I was a child" (01/24/2012)   Hyperlipidemia    takes Simvastatin daily   Hypertension    takes  Maxzide daily   Hypothyroidism    takes Synthroid daily   Insomnia    takes Ambien nightly   Lupus ([place])    Neck pain    herniated disc and radiculopathy   Peripheral neuropathy    Pneumonia 03/05/2010   "have had it a couple times" (01/24/2012)   Type II diabetes mellitus ([place])    takes Janumet daily    Past Surgical History:  Procedure Laterality Date   ANTERIOR CERVICAL DECOMP/DISCECTOMY FUSION  2002; 01/24/2012   ANTERIOR CERVICAL DECOMP/DISCECTOMY FUSION  01/24/2012   Procedure: ANTERIOR CERVICAL DECOMPRESSION/DISCECTOMY FUSION 1 LEVEL;  Surgeon: Ophelia Charter, MD;  Location: [facility];  Service: Neurosurgery;  Laterality: N/A;  CERVICAL THREE-FOUR anterior cervical decompression with fusion interbody prothesis plating and bonegraft   APPENDECTOMY  11/04/1958   BACK SURGERY  03/06/1999   BLADDER SURGERY  03/06/1975   through abdomen   BLADDER SUSPENSION  03/05/1972   BUNIONECTOMY  11/04/1978   left   CARDIAC CATHETERIZATION  03/05/2006   CATARACT EXTRACTION     COLONOSCOPY     ESOPHAGOGASTRODUODENOSCOPY     LUMBAR FUSION  03/06/2007   MOUTH SURGERY     growth removed inside mouth per patient   [facility]  03/05/1998  VAGINAL HYSTERECTOMY  03/05/1972     Current Outpatient Medications  Medication Sig Dispense Refill   albuterol (VENTOLIN HFA) 108 (90 Base) MCG/ACT inhaler as needed.     BREZTRI AEROSPHERE 160-9-4.8 MCG/ACT AERO 2 (two) times daily.     clopidogrel (PLAVIX) 75 MG tablet Take 1 tablet (75 mg total) by mouth daily. 90 tablet 3   Cyanocobalamin (B-12 COMPLIANCE INJECTION) 1000 MCG/ML KIT Vitamin B12 1077mg IM injection daily for 7 days, then weekly for 4 weeks, then monthly thereafter for 1 year. 1 kit 0   donepezil (ARICEPT) 5 MG tablet Take 5 mg by mouth at bedtime.     Etanercept (ENBREL MINI) 50 MG/ML SOCT Inject 50 mg into the skin once a week. 12 mL 0   insulin glargine, 1 Unit Dial, (TOUJEO SOLOSTAR) 300 UNIT/ML Solostar Pen 35  units in the morning and 50 units at bedtime.     isosorbide mononitrate (IMDUR) 30 MG 24 hr tablet Take 1 tablet (30 mg total) by mouth daily. 90 tablet 3   levothyroxine (SYNTHROID, LEVOTHROID) 50 MCG tablet Take 50 mcg by mouth daily before breakfast.     metFORMIN (GLUCOPHAGE) 1000 MG tablet Take 1,000 mg by mouth 2 (two) times daily with a meal.     nitroGLYCERIN (NITROSTAT) 0.4 MG SL tablet Place 1 tablet (0.4 mg total) under the tongue every 5 (five) minutes x 3 doses as needed for chest pain. If no relief after 3rd dose, proceed to the ED for an evaluation 25 tablet 3   ONETOUCH VERIO test strip 1 each 4 (four) times daily.     predniSONE (DELTASONE) 50 MG tablet Take (1) tab 13 hrs before, (1) tab 7 hrs before and (1) tab 1 hr before 3 tablet 0   sertraline (ZOLOFT) 100 MG tablet Take 100 mg by mouth daily.      tiZANidine (ZANAFLEX) 2 MG tablet Start 1 tablet at bedtime x 3 days, then increase to 1 tablet twice daily 60 tablet 5   triamterene-hydrochlorothiazide (MAXZIDE-25) 37.5-25 MG tablet Take 1 tablet by mouth daily.     VITAMIN D PO Take by mouth daily.     zolpidem (AMBIEN) 5 MG tablet Take 5 mg by mouth at bedtime. For sleep     ALPRAZolam (XANAX) 0.25 MG tablet Take 0.25 mg by mouth 2 (two) times daily as needed.     No current facility-administered medications for this visit.    Allergies:   Erythromycin, Penicillins, Gadolinium derivatives, Iodinated contrast media, Latex, Morphine, Other, Prednisone, Sulfonamide derivatives, Varenicline tartrate, Aspirin, and Atorvastatin    Social History:  The patient  reports that she has been smoking cigarettes. She has a 60.00 pack-year smoking history. She has never been exposed to tobacco smoke. She has never used smokeless tobacco. She reports that she does not drink alcohol and does not use drugs.   Family History:  The patient's family history includes Arrhythmia in her father; Blindness in her brother; Diabetes in her brother  and sister; Heart attack in her brother; Heart disease in her sister; Heart disease (age of onset: 628 in her mother.    ROS:  Please see the history of present illness.   Otherwise, review of systems are positive for none.   All other systems are reviewed and negative.    PHYSICAL EXAM: VS:  BP (!) 151/64   Pulse (!) 57   Ht _0  (1.651 m)   Wt 133 lb (60.3 kg)   SpO2 99%  BMI 22.13 kg/m  , BMI Body mass index is 22.13 kg/m. GENERAL:  Well appearing HEENT:  Pupils equal round and reactive, fundi not visualized, oral mucosa unremarkable NECK:  No jugular venous distention, waveform within normal limits, carotid upstroke brisk and symmetric, no bruits, no thyromegaly LYMPHATICS:  No cervical, inguinal adenopathy LUNGS:  Clear to auscultation bilaterally BACK:  No CVA tenderness CHEST:  Unremarkable HEART:  PMI not displaced or sustained,S1 and S2 within normal limits, no S3, no S4, no clicks, no rubs, no murmurs ABD:  Flat, positive bowel sounds normal in frequency in pitch, no bruits, no rebound, no guarding, no midline pulsatile mass, no hepatomegaly, no splenomegaly EXT:  2 plus pulses throughout, no edema, no cyanosis no clubbing SKIN:  No rashes no nodules NEURO:  Cranial nerves II through XII grossly intact, motor grossly intact throughout PSYCH:  Cognitively intact, oriented to person place and time    EKG:  EKG is ordered today. The ekg ordered today demonstrates sinus rhythm, rate 59, axis within normal limits, intervals nonspecific T wave changes improved from previous, QT has shortened  Recent Labs: 08/02/2021: ALT 8; Hemoglobin 13.7; Platelets 255 08/11/2021: BUN 17; Creatinine, Ser 0.97; Potassium 4.1; Sodium 136    Lipid Panel    Component Value Date/Time   CHOL (H) 09/07/2006 0605    271        ATP III CLASSIFICATION:  <200     mg/dL   Desirable  200-239  mg/dL   Borderline High  >=240    mg/dL   High   TRIG 300 (H) 09/07/2006 0605   HDL 31 (L) 09/07/2006  0605   CHOLHDL 8.7 09/07/2006 0605   VLDL 60 (H) 09/07/2006 0605   LDLCALC (H) 09/07/2006 0605    180        Total Cholesterol/HDL:CHD Risk Coronary Heart Disease Risk Table                     Men   Women  1/2 Average Risk   3.4   3.3      Wt Readings from Last 3 Encounters:  08/30/21 133 lb (60.3 kg)  08/09/21 131 lb 9.6 oz (59.7 kg)  08/02/21 131 lb 6.4 oz (59.6 kg)     CT  1. Left Main: No significant stenosis: LM FFR =0.9.   2. LAD: No hemodynamically significant lesion: Proximal FFR = 0.97, Mid FFR = 0.89, Distal FFR = 0.81.   3. LCX: Possible hemodynamically significant lesion distally: Proximal FFR = 0.98, Mid FFR = 0.97, Distal FFR = 0.79.   4. RCA: No hemodynamically significant lesion: Proximal FFR = 0.97, Mid FFR = 0.93, Distal FFR =0.83.   Other studies Reviewed: Additional studies/ records that were reviewed today include: Labs, CT, echo Review of the above records demonstrates:  Please see elsewhere in the note.     ASSESSMENT AND PLAN:  Chest pain:   Her chest discomfort is consistent with unstable angina significant symptoms.  Given the severity of the symptoms coupled with a markedly abnormal EKG I would suggest cardiac catheterization.   The patient understands that risks included but are not limited to stroke (1 in 1000), death (1 in 50), kidney failure [usually temporary] (1 in 500), bleeding (1 in 200), allergic reaction [possibly serious] (1 in 200).  The patient understands and agrees to proceed.  She will need diet prophylaxis.  She cannot tolerate aspirin so we will give her Plavix.  She has not been  loaded.  Abnormal EKG: The T wave changes are improved compared to previous.  This will be evaluated as above.  HTN:   The blood pressure is mildly elevated.  I am going to start Imdur 30 mg daily.  She will need other med titration but she is hesitant to take more medicines.   Type 2 diabetes: This is followed by her primary provider.  I do not  see a recent A1c.  Statin intolerance: She had some genetic testing and is intolerant of statins.  I am going to see eventually phytotoxin with PCSK9 inhibitors.  Current medicines are reviewed at length with the patient today.  The patient does not have concerns regarding medicines.  The following changes have been made: As above  Labs/ tests ordered today include:       Orders Placed This Encounter  Procedures   EKG 12-Lead     Disposition:   FU with me after the cath  Signed, Minus Breeding, MD  08/30/2021 11:57 AM    [place]

## 2021-08-30 ENCOUNTER — Encounter: Payer: Self-pay | Admitting: Cardiology

## 2021-08-30 ENCOUNTER — Ambulatory Visit: Payer: Medicare Other | Admitting: Cardiology

## 2021-08-30 VITALS — BP 151/64 | HR 57 | Ht 65.0 in | Wt 133.0 lb

## 2021-08-30 DIAGNOSIS — E118 Type 2 diabetes mellitus with unspecified complications: Secondary | ICD-10-CM

## 2021-08-30 DIAGNOSIS — I10 Essential (primary) hypertension: Secondary | ICD-10-CM

## 2021-08-30 DIAGNOSIS — R072 Precordial pain: Secondary | ICD-10-CM | POA: Diagnosis not present

## 2021-08-30 DIAGNOSIS — R9431 Abnormal electrocardiogram [ECG] [EKG]: Secondary | ICD-10-CM

## 2021-08-30 MED ORDER — CLOPIDOGREL BISULFATE 75 MG PO TABS: 75.0000 mg | ORAL_TABLET | Freq: Every day | ORAL | 3 refills | Status: DC

## 2021-08-30 MED ORDER — PREDNISONE 50 MG PO TABS: ORAL_TABLET | ORAL | 0 refills | Status: DC

## 2021-08-30 MED ORDER — ISOSORBIDE MONONITRATE ER 30 MG PO TB24: 30.0000 mg | ORAL_TABLET | Freq: Every day | ORAL | 3 refills | Status: DC

## 2021-08-30 NOTE — Patient Instructions (Signed)
Medication Instructions:  Please start Isosorbide 30 mg a day and Plavix 75 mg a day. Continue all other medications as listed.  *If you need a refill on your cardiac medications before your next appointment, please call your pharmacy*   Lab Work: None today If you have labs (blood work) drawn today and your tests are completely normal, you will receive your results only by: MyChart Message (if you have MyChart) OR A paper copy in the mail If you have any lab test that is abnormal or we need to change your treatment, we will call you to review the results.   Testing/Procedures:   [name] [name] [name] [name] [address] Dept: [phone] Loc: [phone]  [name]  08/30/2021  You are scheduled for a Cardiac Catheterization on Friday, June 30 with Dr. Verne Carrow.  1. Please arrive at the [name] at [name]: [address] [place], [name] [address] at 10:00 AM (two hours before your procedure to ensure your preparation). Free valet parking service is available.   Special note: Every effort is made to have your procedure done on time. Please understand that emergencies sometimes delay scheduled procedures.  2. Diet: Do not eat or drink anything after midnight prior to your procedure except sips of water to take medications.  3. Labs: None needed.  4. Medication instructions in preparation for your procedure:  Take only 25 units of insulin the night before your procedure. Do not take any insulin on the day of the procedure.  On the morning of your procedure, take your Plavix/Clopidogrel and any morning medicines NOT listed above.  You may use sips of water.  5. Plan for one night stay--bring personal belongings. 6. Bring a current list of your medications and current insurance cards. 7. You MUST have a responsible person to drive you  home. 8. Someone MUST be with you the first 24 hours after you arrive home or your discharge will be delayed. 9. Please wear clothes that are easy to get on and off and wear slip-on shoes.  Thank you for allowing [place] to care for you!   -- [name]  Follow-Up: At [name], you and your health needs are our priority.  As part of our continuing mission to provide you with exceptional heart care, we have created designated Provider Care Teams.  These Care Teams include your primary Cardiologist (physician) and Advanced Practice Providers (APPs -  Physician Assistants and Nurse Practitioners) who all work together to provide you with the care you need, when you need it.  We recommend signing up for the patient portal called "MyChart".  Sign up information is provided on this After Visit Summary.  MyChart is used to connect with patients for Virtual Visits (Telemedicine).  Patients are able to view lab/test results, encounter notes, upcoming appointments, etc.  Non-urgent messages can be sent to your provider as well.   To learn more about what you can do with MyChart, go to [email or web address].    Your next appointment:   1 month(s)  The format for your next appointment:   In Person  Provider:   Rollene Rotunda, MD{    Important Information About Sugar

## 2021-08-31 ENCOUNTER — Telehealth: Payer: Self-pay | Admitting: *Deleted

## 2021-08-31 NOTE — Telephone Encounter (Signed)
Cardiac Catheterization scheduled at [facility] for: Friday September 01, 2021 12 Noon Arrival time and place: [facility] Main Entrance A at: 10 AM   Nothing to eat after midnight prior to procedure, clear liquids until 5 AM day of procedure.  CONTRAST ALLERGY: yes-13 hour Prednisone and Benadryl Prep reviewed with patient:  08/31/21 Prednisone 50 mg 11 PM  09/01/21 Prednisone 50 mg 5 AM  09/01/21 Prednisone 50 mg and Benadryl 50 mg -just prior to leaving home for hospital morning of procedure  Medication instructions: -Hold:  Toujeo-AM of procedure/1/2 usual dose HS prior to procedure  Metformin-day of procedure and 48 hours post procedure  Triamterene/HCT-AM of procedure -Except hold medications usual morning medications can be taken with sips of water including Plavix 75 mg.  Patient reports unable to tolerate aspirin.   Confirmed patient has responsible adult to drive home post procedure and be with patient first 24 hours after arriving home.  Patient reports no new symptoms concerning for COVID-19 in the past 10 days.  Reviewed procedure instructions with patient.

## 2021-09-01 ENCOUNTER — Encounter (HOSPITAL_COMMUNITY): Payer: Self-pay | Admitting: Cardiovascular Disease

## 2021-09-01 ENCOUNTER — Ambulatory Visit (HOSPITAL_COMMUNITY)
Admission: RE | Disposition: A | Discharge status: Home / Self Care | Payer: Self-pay | Source: Home / Self Care | Attending: Cardiovascular Disease

## 2021-09-01 ENCOUNTER — Ambulatory Visit (HOSPITAL_COMMUNITY)
Admission: RE | Admit: 2021-09-01 | Discharge: 2021-09-02 | Disposition: A | Discharge status: Home / Self Care | Payer: Medicare Other | Attending: Cardiovascular Disease | Admitting: Cardiovascular Disease

## 2021-09-01 ENCOUNTER — Other Ambulatory Visit: Payer: Self-pay

## 2021-09-01 DIAGNOSIS — Z886 Allergy status to analgesic agent status: Secondary | ICD-10-CM | POA: Diagnosis not present

## 2021-09-01 DIAGNOSIS — M329 Systemic lupus erythematosus, unspecified: Secondary | ICD-10-CM | POA: Diagnosis not present

## 2021-09-01 DIAGNOSIS — I200 Unstable angina: Secondary | ICD-10-CM | POA: Diagnosis present

## 2021-09-01 DIAGNOSIS — Z794 Long term (current) use of insulin: Secondary | ICD-10-CM | POA: Diagnosis not present

## 2021-09-01 DIAGNOSIS — I2584 Coronary atherosclerosis due to calcified coronary lesion: Secondary | ICD-10-CM | POA: Insufficient documentation

## 2021-09-01 DIAGNOSIS — E039 Hypothyroidism, unspecified: Secondary | ICD-10-CM | POA: Diagnosis not present

## 2021-09-01 DIAGNOSIS — E785 Hyperlipidemia, unspecified: Secondary | ICD-10-CM | POA: Diagnosis present

## 2021-09-01 DIAGNOSIS — Z7984 Long term (current) use of oral hypoglycemic drugs: Secondary | ICD-10-CM | POA: Diagnosis not present

## 2021-09-01 DIAGNOSIS — F17210 Nicotine dependence, cigarettes, uncomplicated: Secondary | ICD-10-CM | POA: Diagnosis not present

## 2021-09-01 DIAGNOSIS — I10 Essential (primary) hypertension: Secondary | ICD-10-CM | POA: Diagnosis present

## 2021-09-01 DIAGNOSIS — I2510 Atherosclerotic heart disease of native coronary artery without angina pectoris: Secondary | ICD-10-CM

## 2021-09-01 DIAGNOSIS — Z7902 Long term (current) use of antithrombotics/antiplatelets: Secondary | ICD-10-CM | POA: Insufficient documentation

## 2021-09-01 DIAGNOSIS — I25110 Atherosclerotic heart disease of native coronary artery with unstable angina pectoris: Secondary | ICD-10-CM | POA: Insufficient documentation

## 2021-09-01 DIAGNOSIS — J449 Chronic obstructive pulmonary disease, unspecified: Secondary | ICD-10-CM | POA: Diagnosis not present

## 2021-09-01 DIAGNOSIS — E119 Type 2 diabetes mellitus without complications: Secondary | ICD-10-CM

## 2021-09-01 DIAGNOSIS — Z79899 Other long term (current) drug therapy: Secondary | ICD-10-CM | POA: Insufficient documentation

## 2021-09-01 DIAGNOSIS — Z955 Presence of coronary angioplasty implant and graft: Secondary | ICD-10-CM | POA: Diagnosis not present

## 2021-09-01 DIAGNOSIS — E1142 Type 2 diabetes mellitus with diabetic polyneuropathy: Secondary | ICD-10-CM | POA: Diagnosis not present

## 2021-09-01 HISTORY — PX: CORONARY STENT INTERVENTION: CATH118234

## 2021-09-01 HISTORY — PX: LEFT HEART CATH AND CORONARY ANGIOGRAPHY: CATH118249

## 2021-09-01 LAB — GLUCOSE, CAPILLARY
Glucose-Capillary: 211 mg/dL — ABNORMAL HIGH (ref 70–99)
Glucose-Capillary: 159 mg/dL — ABNORMAL HIGH (ref 70–99)
Glucose-Capillary: 253 mg/dL — ABNORMAL HIGH (ref 70–99)
Glucose-Capillary: 158 mg/dL — ABNORMAL HIGH (ref 70–99)

## 2021-09-01 LAB — HEMOGLOBIN A1C
Hgb A1c MFr Bld: 6 % — ABNORMAL HIGH (ref 4.8–5.6)
Mean Plasma Glucose: 125.5 mg/dL

## 2021-09-01 LAB — POCT ACTIVATED CLOTTING TIME: Activated Clotting Time: 269 seconds

## 2021-09-01 SURGERY — LEFT HEART CATH AND CORONARY ANGIOGRAPHY
Anesthesia: LOCAL

## 2021-09-01 MED ORDER — DONEPEZIL HCL 5 MG PO TABS
5.0000 mg | ORAL_TABLET | Freq: Every day | ORAL | Status: DC
  Administered 2021-09-01: 5 mg via ORAL
  Filled 2021-09-01: qty 1

## 2021-09-01 MED ORDER — ALBUTEROL SULFATE HFA 108 (90 BASE) MCG/ACT IN AERS: 2.0000 | INHALATION_SPRAY | Freq: Three times a day (TID) | RESPIRATORY_TRACT | Status: DC | PRN

## 2021-09-01 MED ORDER — FLUTICASONE FUROATE-VILANTEROL 200-25 MCG/ACT IN AEPB
1.0000 | INHALATION_SPRAY | Freq: Every day | RESPIRATORY_TRACT | Status: DC
  Administered 2021-09-02: 1 via RESPIRATORY_TRACT
  Filled 2021-09-01: qty 28

## 2021-09-01 MED ORDER — SODIUM CHLORIDE 0.9% FLUSH: 3.0000 mL | INTRAVENOUS | Status: DC | PRN

## 2021-09-01 MED ORDER — MIDAZOLAM HCL 2 MG/2ML IJ SOLN
INTRAMUSCULAR | Status: AC
  Filled 2021-09-01: qty 2

## 2021-09-01 MED ORDER — LABETALOL HCL 5 MG/ML IV SOLN: 10.0000 mg | INTRAVENOUS | Status: AC | PRN

## 2021-09-01 MED ORDER — ALPRAZOLAM 0.25 MG PO TABS
0.2500 mg | ORAL_TABLET | Freq: Two times a day (BID) | ORAL | Status: DC | PRN
Start: 2021-09-01 — End: 2021-09-02

## 2021-09-01 MED ORDER — SODIUM CHLORIDE 0.9% FLUSH
3.0000 mL | INTRAVENOUS | Status: DC | PRN
Start: 2021-09-01 — End: 2021-09-01

## 2021-09-01 MED ORDER — UMECLIDINIUM BROMIDE 62.5 MCG/ACT IN AEPB
1.0000 | INHALATION_SPRAY | Freq: Every day | RESPIRATORY_TRACT | Status: DC
  Administered 2021-09-02: 1 via RESPIRATORY_TRACT
  Filled 2021-09-01: qty 7

## 2021-09-01 MED ORDER — HEPARIN (PORCINE) IN NACL 1000-0.9 UT/500ML-% IV SOLN
INTRAVENOUS | Status: AC
  Filled 2021-09-01: qty 1000

## 2021-09-01 MED ORDER — VERAPAMIL HCL 2.5 MG/ML IV SOLN
INTRAVENOUS | Status: DC | PRN
  Administered 2021-09-01: 10 mL via INTRA_ARTERIAL

## 2021-09-01 MED ORDER — ACETAMINOPHEN 325 MG PO TABS
650.0000 mg | ORAL_TABLET | ORAL | Status: DC | PRN
  Administered 2021-09-01: 650 mg via ORAL
  Filled 2021-09-01: qty 2

## 2021-09-01 MED ORDER — LIDOCAINE HCL (PF) 1 % IJ SOLN
INTRAMUSCULAR | Status: DC | PRN
  Administered 2021-09-01: 2 mL via SUBCUTANEOUS

## 2021-09-01 MED ORDER — ZOLPIDEM TARTRATE 5 MG PO TABS
5.0000 mg | ORAL_TABLET | Freq: Every day | ORAL | Status: DC
  Administered 2021-09-01: 5 mg via ORAL
  Filled 2021-09-01: qty 1

## 2021-09-01 MED ORDER — LIDOCAINE HCL (PF) 1 % IJ SOLN
INTRAMUSCULAR | Status: AC
  Filled 2021-09-01: qty 30

## 2021-09-01 MED ORDER — MIDAZOLAM HCL 2 MG/2ML IJ SOLN
INTRAMUSCULAR | Status: DC | PRN
  Administered 2021-09-01: 1 mg via INTRAVENOUS

## 2021-09-01 MED ORDER — SODIUM CHLORIDE 0.9% FLUSH
3.0000 mL | Freq: Two times a day (BID) | INTRAVENOUS | Status: DC
  Administered 2021-09-01: 3 mL via INTRAVENOUS

## 2021-09-01 MED ORDER — LEVOTHYROXINE SODIUM 50 MCG PO TABS
50.0000 ug | ORAL_TABLET | Freq: Every day | ORAL | Status: DC
Start: 2021-09-02 — End: 2021-09-02
  Administered 2021-09-02: 50 ug via ORAL
  Filled 2021-09-01: qty 1

## 2021-09-01 MED ORDER — CLOPIDOGREL BISULFATE 75 MG PO TABS
75.0000 mg | ORAL_TABLET | Freq: Every day | ORAL | Status: DC
Start: 2021-09-02 — End: 2021-09-02
  Administered 2021-09-02: 75 mg via ORAL
  Filled 2021-09-01: qty 1

## 2021-09-01 MED ORDER — SODIUM CHLORIDE 0.9 % WEIGHT BASED INFUSION
3.0000 mL/kg/h | INTRAVENOUS | Status: DC
  Administered 2021-09-01: 3 mL/kg/h via INTRAVENOUS

## 2021-09-01 MED ORDER — NITROGLYCERIN 0.4 MG SL SUBL
0.4000 mg | SUBLINGUAL_TABLET | SUBLINGUAL | Status: DC | PRN
Start: 2021-09-01 — End: 2021-09-02

## 2021-09-01 MED ORDER — ONDANSETRON HCL 4 MG/2ML IJ SOLN: 4.0000 mg | Freq: Four times a day (QID) | INTRAMUSCULAR | Status: DC | PRN

## 2021-09-01 MED ORDER — VERAPAMIL HCL 2.5 MG/ML IV SOLN
INTRAVENOUS | Status: AC
  Filled 2021-09-01: qty 2

## 2021-09-01 MED ORDER — SODIUM CHLORIDE 0.9 % IV SOLN: 250.0000 mL | INTRAVENOUS | Status: DC | PRN

## 2021-09-01 MED ORDER — SODIUM CHLORIDE 0.9 % WEIGHT BASED INFUSION: 1.0000 mL/kg/h | INTRAVENOUS | Status: DC

## 2021-09-01 MED ORDER — SODIUM CHLORIDE 0.9% FLUSH
3.0000 mL | Freq: Two times a day (BID) | INTRAVENOUS | Status: DC
  Administered 2021-09-01 – 2021-09-02 (×2): 3 mL via INTRAVENOUS

## 2021-09-01 MED ORDER — CLOPIDOGREL BISULFATE 75 MG PO TABS: 75.0000 mg | ORAL_TABLET | ORAL | Status: DC

## 2021-09-01 MED ORDER — HEPARIN SODIUM (PORCINE) 1000 UNIT/ML IJ SOLN
INTRAMUSCULAR | Status: AC
  Filled 2021-09-01: qty 10

## 2021-09-01 MED ORDER — HEPARIN (PORCINE) IN NACL 1000-0.9 UT/500ML-% IV SOLN
INTRAVENOUS | Status: DC | PRN
  Administered 2021-09-01 (×2): 500 mL

## 2021-09-01 MED ORDER — BUDESON-GLYCOPYRROL-FORMOTEROL 160-9-4.8 MCG/ACT IN AERO
2.0000 | INHALATION_SPRAY | Freq: Two times a day (BID) | RESPIRATORY_TRACT | Status: DC
Start: 2021-09-01 — End: 2021-09-01

## 2021-09-01 MED ORDER — FENTANYL CITRATE (PF) 100 MCG/2ML IJ SOLN
INTRAMUSCULAR | Status: DC | PRN
  Administered 2021-09-01: 25 ug via INTRAVENOUS

## 2021-09-01 MED ORDER — HEPARIN SODIUM (PORCINE) 1000 UNIT/ML IJ SOLN
INTRAMUSCULAR | Status: DC | PRN
  Administered 2021-09-01: 3000 [IU] via INTRAVENOUS
  Administered 2021-09-01: 2000 [IU] via INTRAVENOUS
  Administered 2021-09-01: 5000 [IU] via INTRAVENOUS

## 2021-09-01 MED ORDER — ISOSORBIDE MONONITRATE ER 30 MG PO TB24
30.0000 mg | ORAL_TABLET | Freq: Every day | ORAL | Status: DC
  Administered 2021-09-02: 30 mg via ORAL
  Filled 2021-09-01: qty 1

## 2021-09-01 MED ORDER — INSULIN ASPART 100 UNIT/ML IJ SOLN
0.0000 [IU] | Freq: Three times a day (TID) | INTRAMUSCULAR | Status: DC
  Administered 2021-09-01: 5 [IU] via SUBCUTANEOUS

## 2021-09-01 MED ORDER — TRIAMTERENE-HCTZ 37.5-25 MG PO TABS
1.0000 | ORAL_TABLET | Freq: Every day | ORAL | Status: DC
  Administered 2021-09-02: 1 via ORAL
  Filled 2021-09-01: qty 1

## 2021-09-01 MED ORDER — IOHEXOL 350 MG/ML SOLN
INTRAVENOUS | Status: DC | PRN
  Administered 2021-09-01: 75 mL

## 2021-09-01 MED ORDER — HYDRALAZINE HCL 20 MG/ML IJ SOLN
10.0000 mg | INTRAMUSCULAR | Status: AC | PRN
Start: 2021-09-01 — End: 2021-09-01

## 2021-09-01 MED ORDER — SODIUM CHLORIDE 0.9 % IV SOLN: INTRAVENOUS | Status: AC

## 2021-09-01 MED ORDER — ALBUTEROL SULFATE (2.5 MG/3ML) 0.083% IN NEBU: 2.5000 mg | INHALATION_SOLUTION | Freq: Four times a day (QID) | RESPIRATORY_TRACT | Status: DC | PRN

## 2021-09-01 MED ORDER — NITROGLYCERIN 1 MG/10 ML FOR IR/CATH LAB
INTRA_ARTERIAL | Status: AC
  Filled 2021-09-01: qty 10

## 2021-09-01 MED ORDER — SERTRALINE HCL 100 MG PO TABS
100.0000 mg | ORAL_TABLET | Freq: Every day | ORAL | Status: DC
  Administered 2021-09-02: 100 mg via ORAL
  Filled 2021-09-01: qty 1

## 2021-09-01 MED ORDER — FENTANYL CITRATE (PF) 100 MCG/2ML IJ SOLN
INTRAMUSCULAR | Status: AC
  Filled 2021-09-01: qty 2

## 2021-09-01 SURGICAL SUPPLY — 17 items
BALL SAPPHIRE NC24 2.50X8 (BALLOONS) ×2
BALLN SAPPHIRE 2.0X15 (BALLOONS) ×2
BALLOON SAPPHIRE 2.0X15 (BALLOONS) IMPLANT
BALLOON SAPPHIRE NC24 2.50X8 (BALLOONS) IMPLANT
CATH 5FR JL3.5 JR4 ANG PIG MP (CATHETERS) ×1 IMPLANT
CATH VISTA GUIDE 6FR XB3 (CATHETERS) ×1 IMPLANT
ELECT DEFIB PAD ADLT CADENCE (PAD) ×1 IMPLANT
GLIDESHEATH SLEND SS 6F .021 (SHEATH) ×1 IMPLANT
GUIDEWIRE INQWIRE 1.5J.035X260 (WIRE) IMPLANT
INQWIRE 1.5J .035X260CM (WIRE) ×2
KIT ENCORE 26 ADVANTAGE (KITS) ×1 IMPLANT
KIT HEART LEFT (KITS) ×3 IMPLANT
PACK CARDIAC CATHETERIZATION (CUSTOM PROCEDURE TRAY) ×3 IMPLANT
STENT ONYX FRONTIER 2.25X18 (Permanent Stent) ×1 IMPLANT
TRANSDUCER W/STOPCOCK (MISCELLANEOUS) ×3 IMPLANT
TUBING CIL FLEX 10 FLL-RA (TUBING) ×3 IMPLANT
WIRE COUGAR XT STRL 190CM (WIRE) ×1 IMPLANT

## 2021-09-01 NOTE — Interval H&P Note (Signed)
History and Physical Interval Note:  09/01/2021 10:49 AM  [employer]  has presented today for surgery, with the diagnosis of chest pain - abnormal ekg.  The various methods of treatment have been discussed with the patient and family. After consideration of risks, benefits and other options for treatment, the patient has consented to  Procedure(s): LEFT HEART CATH AND CORONARY ANGIOGRAPHY (N/A) as a surgical intervention.  The patient's history has been reviewed, patient examined, no change in status, stable for surgery.  I have reviewed the patient's chart and labs.  Questions were answered to the patient's satisfaction.    Cath Lab Visit (complete for each Cath Lab visit)  Clinical Evaluation Leading to the Procedure:   ACS: No.  Non-ACS:    Anginal Classification: CCS III  Anti-ischemic medical therapy: Minimal Therapy (1 class of medications)  Non-Invasive Test Results: High-risk stress test findings: cardiac mortality >3%/year Coronary CTA with possible severe distal vessel disease  Prior CABG: No previous CABG        Verne Carrow

## 2021-09-02 ENCOUNTER — Encounter (HOSPITAL_COMMUNITY): Payer: Self-pay | Admitting: Cardiovascular Disease

## 2021-09-02 DIAGNOSIS — I200 Unstable angina: Secondary | ICD-10-CM

## 2021-09-02 DIAGNOSIS — I2584 Coronary atherosclerosis due to calcified coronary lesion: Secondary | ICD-10-CM | POA: Diagnosis not present

## 2021-09-02 DIAGNOSIS — I2510 Atherosclerotic heart disease of native coronary artery without angina pectoris: Secondary | ICD-10-CM

## 2021-09-02 DIAGNOSIS — I25110 Atherosclerotic heart disease of native coronary artery with unstable angina pectoris: Secondary | ICD-10-CM | POA: Diagnosis not present

## 2021-09-02 DIAGNOSIS — I10 Essential (primary) hypertension: Secondary | ICD-10-CM | POA: Diagnosis not present

## 2021-09-02 DIAGNOSIS — E785 Hyperlipidemia, unspecified: Secondary | ICD-10-CM | POA: Diagnosis not present

## 2021-09-02 LAB — BASIC METABOLIC PANEL
Anion gap: 8 (ref 5–15)
BUN: 24 mg/dL — ABNORMAL HIGH (ref 8–23)
CO2: 21 mmol/L — ABNORMAL LOW (ref 22–32)
Calcium: 8.3 mg/dL — ABNORMAL LOW (ref 8.9–10.3)
Chloride: 105 mmol/L (ref 98–111)
Creatinine, Ser: 1 mg/dL (ref 0.44–1.00)
GFR, Estimated: 58 mL/min — ABNORMAL LOW (ref >60)
Glucose, Bld: 128 mg/dL — ABNORMAL HIGH (ref 70–99)
Potassium: 4.2 mmol/L (ref 3.5–5.1)
Sodium: 134 mmol/L — ABNORMAL LOW (ref 135–145)

## 2021-09-02 LAB — CBC
HCT: 30.8 % — ABNORMAL LOW (ref 36.0–46.0)
Hemoglobin: 10.1 g/dL — ABNORMAL LOW (ref 12.0–15.0)
MCH: 29.8 pg (ref 26.0–34.0)
MCHC: 32.8 g/dL (ref 30.0–36.0)
MCV: 90.9 fL (ref 80.0–100.0)
Platelets: 178 10*3/uL (ref 150–400)
RBC: 3.39 MIL/uL — ABNORMAL LOW (ref 3.87–5.11)
RDW: 13.6 % (ref 11.5–15.5)
WBC: 14.8 10*3/uL — ABNORMAL HIGH (ref 4.0–10.5)
nRBC: 0 % (ref 0.0–0.2)

## 2021-09-02 LAB — GLUCOSE, CAPILLARY
Glucose-Capillary: 108 mg/dL — ABNORMAL HIGH (ref 70–99)
Glucose-Capillary: 114 mg/dL — ABNORMAL HIGH (ref 70–99)

## 2021-09-02 MED ORDER — METFORMIN HCL 1000 MG PO TABS: 1000.0000 mg | ORAL_TABLET | Freq: Every day | ORAL | Status: AC

## 2021-09-02 NOTE — Discharge Instructions (Signed)
Do not take Metformin until July 3

## 2021-09-02 NOTE — Progress Notes (Signed)
CARDIAC REHAB PHASE I   PRE:  Rate/Rhythm: 52 SB   BP:  Supine: 132/62     SaO2: 98 RA  MODE:  Ambulation: 470 ft   POST:  Rate/Rhythm: 64 SR  BP:  Sitting: 137/87      SaO2: during walk95 RA,  2 mins post 97RA  Pt tolerated exercise Well and AMB 470 ft with no assistive device, and standby assist. Pt had no rest break, chest pain, SOB or pain. Education given to pt on heart healthy diet, radial/femoral weight restrictions, adherence to NTG, Brilinta and ASA.  Home exercise guidelines given and will refer to cardiac rehab phase 2. Pt left in the bed with call bell in reach. All questions were answered and pt verbalized understanding.  Harrie Jeans ACSM-CEP 09/02/2021 8:33 AM

## 2021-09-02 NOTE — Discharge Summary (Signed)
Discharge Summary    Patient ID: [name] MRN: [id number]; DOB: 04-15-1945  Admit date: 09/01/2021 Discharge date: 09/02/2021  PCP:  Willey Blade, MD   [facility] Providers Cardiologist:  Minus Breeding, MD   { Click here to update MD or APP on Care Team, Refresh:1}     Discharge Diagnoses    Principal Problem:   Unstable angina [facility]) Active Problems:   CAD (coronary artery disease)   Hyperlipidemia LDL goal <70   Essential hypertension    Diagnostic Studies/Procedures    Cardiac catheterization 09/01/2021   Prox RCA lesion is 40% stenosed.   Dist RCA lesion is 60% stenosed.   RPDA lesion is 60% stenosed.   2nd Mrg lesion is 90% stenosed.   Prox LAD to Mid LAD lesion is 50% stenosed.   A drug-eluting stent was successfully placed using a STENT ONYX FRONTIER 2.25X18.   Post intervention, there is a 0% residual stenosis.   Moderate non-obstructive disease in the mid LAD The Circumflex terminates into a large obtuse marginal branch. The obtuse marginal branch has a severe mid stenosis Successful PTCA/DES x 1 obtuse marginal branch The RCA is a large dominant vessel with moderate mid and distal stenosis. The PDA has a moderate ostial stenosis. These lesions do not appear to be flow limiting.    Recommendations: Will continue Plavix daily. Continue Imdur. She has an ASA allergy. She is statin intolerant. Consider Repatha as an outpatient.        _____________   History of Present Illness     [name] is a 76 y.o. female with coronary artery disease, Chronic Obstructive Pulmonary Disease, hyperlipidemia, hypertension, hypothyroidism, Lupus, diabetes mellitus.  She had a recent CCTA for evaluation chest pain which demonstrated potentially significant distal vessel LCx disease. She had ongoing symptoms concerning for unstable angina.  She was seen by Dr. Percival Spanish and set up for cardiac catheterization. She cannot tolerate ASA and was placed  on Plavix.  She cannot tolerate statins and will need referral to start PCSK9 inhibitors.   Hospital Course     Consultants: none    She presented yesterday for cardiac catheterization with Dr. Angelena Form. This demonstrated mod non-obstructive disease in the mid LAD and mod non-obstructive disease in the RCA and PDA.  She had severe mid OM stenosis which was treated with a DES.  She will continue on Plavix alone as she has an ASA allergy. She tolerated the procedure well without immediate complications. She was seen by Dr. Johnsie Cancel this AM.  She is doing well without chest pain and is ready for DC to home. She has f/u with Dr. Percival Spanish Aug 9.  Did the patient have an acute coronary syndrome (MI, NSTEMI, STEMI, etc) this admission?:  No                               Did the patient have a percutaneous coronary intervention (stent / angioplasty)?:  Yes.     Cath/PCI Registry Performance & Quality Measures: Aspirin prescribed? - No - allergy ADP Receptor Inhibitor (Plavix/Clopidogrel, Brilinta/Ticagrelor or Effient/Prasugrel) prescribed (includes medically managed patients)? - Yes High Intensity Statin (Lipitor 40-55m or Crestor 20-474m prescribed? - No - intolerance For EF <40%, was ACEI/ARB prescribed? - Not Applicable (EF >/= 4011%For EF <40%, Aldosterone Antagonist (Spironolactone or Eplerenone) prescribed? - Not Applicable (EF >/= 4003%Cardiac Rehab Phase II ordered? - Yes  _____________  Discharge Vitals Blood pressure 140/67, pulse (!) 53, temperature 98.4 F (36.9 C), temperature source Oral, resp. rate 16, height 5' 5"  (1.651 m), weight 59.4 kg, SpO2 98 %.  Filed Weights   09/01/21 1011  Weight: 59.4 kg    Labs & Radiologic Studies    CBC Recent Labs    09/02/21 0021  WBC 14.8*  HGB 10.1*  HCT 30.8*  MCV 90.9  PLT 726   Basic Metabolic Panel Recent Labs    09/02/21 0021  NA 134*  K 4.2  CL 105  CO2 21*  GLUCOSE 128*  BUN 24*  CREATININE 1.00  CALCIUM  8.3*    Hemoglobin A1C Recent Labs    09/01/21 1453  HGBA1C 6.0*    _____________   Disposition   Pt is being discharged home today in good condition.  Follow-up Plans & Appointments     Follow-up Information     Minus Breeding, MD Follow up on 10/11/2021.   Specialty: Cardiology Why: 11:40 am Contact information: [address] [place] [address] [phone]                Discharge Instructions     Amb Referral to Cardiac Rehabilitation   Complete by: As directed    Diagnosis: Coronary Stents   After initial evaluation and assessments completed: Virtual Based Care may be provided alone or in conjunction with Phase 2 Cardiac Rehab based on patient barriers.: Yes   Diet - low sodium heart healthy   Complete by: As directed    Diet Carb Modified   Complete by: As directed    Driving Restrictions   Complete by: As directed    No driving for 1 week   Increase activity slowly   Complete by: As directed    Lifting restrictions   Complete by: As directed    No lifting over 5 lbs for 2 weeks   Sexual Activity Restrictions   Complete by: As directed    None for 2 weeks       Discharge Medications   Allergies as of 09/02/2021       Reactions   Erythromycin Anaphylaxis   Penicillins Anaphylaxis   Gadolinium Derivatives Hives, Rash   Pt broke out in hives all over body.  Has IV steroids and Benadryl in hospital for MRI.   Iodinated Contrast Media Hives   13-hour prep Pt had slight itching of her abd after her injection and had taken her 13 hr prep.  Consult with radiologist before another procedure to see if IV contrast is necessary, per Dr Almyra Free on 01/06/14, JB/   Latex Dermatitis, Other (See Comments)   blisters   Morphine Nausea And Vomiting   Other Itching, Other (See Comments)   Chlorhexin (CHG); "burning"   Prednisone Other (See Comments)   "shakes me out of my frame" IM steroids aren't as bad as PO   Sulfonamide Derivatives  Itching, Rash   All over body   Varenicline Tartrate Nausea And Vomiting      Aspirin Nausea Only, Other (See Comments)   Burning in stomach   Atorvastatin Other (See Comments)   System will not process        Medication List     STOP taking these medications    diphenhydrAMINE 50 MG tablet Commonly known as: BENADRYL   predniSONE 50 MG tablet Commonly known as: DELTASONE       TAKE these medications    albuterol 108 (90 Base)  MCG/ACT inhaler Commonly known as: VENTOLIN HFA Inhale 2 puffs into the lungs every 8 (eight) hours as needed (asthma / chest congestion).   ALPRAZolam 0.25 MG tablet Commonly known as: XANAX Take 0.25 mg by mouth 2 (two) times daily as needed for anxiety.   Vitamin B-12 5000 MCG Subl Place 5,000 mcg under the tongue daily. What changed: Another medication with the same name was changed. Make sure you understand how and when to take each.   B-12 Compliance Injection 1000 MCG/ML Kit Generic drug: Cyanocobalamin Vitamin B12 1061mg IM injection daily for 7 days, then weekly for 4 weeks, then monthly thereafter for 1 year. What changed:  how much to take how to take this when to take this additional instructions   Breztri Aerosphere 160-9-4.8 MCG/ACT Aero Generic drug: Budeson-Glycopyrrol-Formoterol Inhale 2 puffs into the lungs 2 (two) times daily.   clopidogrel 75 MG tablet Commonly known as: PLAVIX Take 1 tablet (75 mg total) by mouth daily.   donepezil 5 MG tablet Commonly known as: ARICEPT Take 5 mg by mouth at bedtime.   Enbrel Mini 50 MG/ML Soct Generic drug: Etanercept Inject 50 mg into the skin once a week.   isosorbide mononitrate 30 MG 24 hr tablet Commonly known as: IMDUR Take 1 tablet (30 mg total) by mouth daily.   levothyroxine 50 MCG tablet Commonly known as: SYNTHROID Take 50 mcg by mouth daily before breakfast.   metFORMIN 1000 MG tablet Commonly known as: GLUCOPHAGE Take 1 tablet (1,000 mg total) by  mouth daily with breakfast. Start taking on: September 04, 2021 What changed: These instructions start on September 04, 2021. If you are unsure what to do until then, ask your doctor or other care provider.   nitroGLYCERIN 0.4 MG SL tablet Commonly known as: NITROSTAT Place 1 tablet (0.4 mg total) under the tongue every 5 (five) minutes x 3 doses as needed for chest pain. If no relief after 3rd dose, proceed to the ED for an evaluation   OneTouch Verio test strip Generic drug: glucose blood 1 each 4 (four) times daily.   sertraline 100 MG tablet Commonly known as: ZOLOFT Take 100 mg by mouth daily.   tiZANidine 2 MG tablet Commonly known as: ZANAFLEX Start 1 tablet at bedtime x 3 days, then increase to 1 tablet twice daily What changed:  how much to take how to take this when to take this additional instructions   Toujeo SoloStar 300 UNIT/ML Solostar Pen Generic drug: insulin glargine (1 Unit Dial) Inject 0-50 Units into the skin See admin instructions. Sliding scale  Under 120=0 Over 120= 10-20 units in the morning Bedtime 20 units if needed, can take up to 50 units depending in Blood glucose  35 units in the morning and 50 units at bedtime.   triamterene-hydrochlorothiazide 37.5-25 MG tablet Commonly known as: MAXZIDE-25 Take 1 tablet by mouth daily.   VITAMIN D PO Take 5,000 Units by mouth daily.   zolpidem 5 MG tablet Commonly known as: AMBIEN Take 5 mg by mouth at bedtime. For sleep           Outstanding Labs/Studies   None   Duration of Discharge Encounter   Greater than 30 minutes including physician time.  Signed, SRichardson Dopp PA-C 09/02/2021, 11:51 AM

## 2021-09-02 NOTE — Progress Notes (Signed)
Subjective:  Denies SSCP, palpitations or Dyspnea Wants to go home   Objective:  Vitals:   09/01/21 1611 09/01/21 1928 09/02/21 0503 09/02/21 0746  BP: (!) 136/53 (!) 119/48 140/67   Pulse: 60 67 77 (!) 53  Resp: 20 18 16 16   Temp: (!) 97.4 F (36.3 C) 97.7 F (36.5 C) 97.7 F (36.5 C) 98.4 F (36.9 C)  TempSrc: Oral Oral Oral Oral  SpO2: 97% 95% 99% 98%  Weight:      Height:        Intake/Output from previous day:  Intake/Output Summary (Last 24 hours) at 09/02/2021 11/03/2021 Last data filed at 09/02/2021 0400 Gross per 24 hour  Intake 2379.19 ml  Output --  Net 2379.19 ml    Physical Exam: Affect appropriate Healthy:  appears stated age HEENT: normal Neck supple with no adenopathy JVP normal no bruits no thyromegaly Lungs clear with no wheezing and good diaphragmatic motion Heart:  S1/S2 no murmur, no rub, gallop or click PMI normal Abdomen: benighn, BS positve, no tenderness, no AAA no bruit.  No HSM or HJR Distal pulses intact with no bruits No edema Neuro non-focal Skin warm and dry No muscular weakness Right radial A   Lab Results: Basic Metabolic Panel: Recent Labs    09/02/21 0021  NA 134*  K 4.2  CL 105  CO2 21*  GLUCOSE 128*  BUN 24*  CREATININE 1.00  CALCIUM 8.3*   Liver Function Tests: No results for input(s): "AST", "ALT", "ALKPHOS", "BILITOT", "PROT", "ALBUMIN" in the last 72 hours. No results for input(s): "LIPASE", "AMYLASE" in the last 72 hours. CBC: Recent Labs    09/02/21 0021  WBC 14.8*  HGB 10.1*  HCT 30.8*  MCV 90.9  PLT 178   Cardiac Enzymes: No results for input(s): "CKTOTAL", "CKMB", "CKMBINDEX", "TROPONINI" in the last 72 hours. BNP: Invalid input(s): "POCBNP" D-Dimer: No results for input(s): "DDIMER" in the last 72 hours. Hemoglobin A1C: Recent Labs    09/01/21 1453  HGBA1C 6.0*   Fasting Lipid Panel: No results for input(s): "CHOL", "HDL", "LDLCALC", "TRIG", "CHOLHDL", "LDLDIRECT" in the last 72  hours. Thyroid Function Tests: No results for input(s): "TSH", "T4TOTAL", "T3FREE", "THYROIDAB" in the last 72 hours.  Invalid input(s): "FREET3" Anemia Panel: No results for input(s): "VITAMINB12", "FOLATE", "FERRITIN", "TIBC", "IRON", "RETICCTPCT" in the last 72 hours.  Imaging: CARDIAC CATHETERIZATION  Result Date: 09/01/2021   Prox RCA lesion is 40% stenosed.   Dist RCA lesion is 60% stenosed.   RPDA lesion is 60% stenosed.   2nd Mrg lesion is 90% stenosed.   Prox LAD to Mid LAD lesion is 50% stenosed.   A drug-eluting stent was successfully placed using a STENT ONYX FRONTIER 2.25X18.   Post intervention, there is a 0% residual stenosis. Moderate non-obstructive disease in the mid LAD The Circumflex terminates into a large obtuse marginal branch. The obtuse marginal branch has a severe mid stenosis Successful PTCA/DES x 1 obtuse marginal branch The RCA is a large dominant vessel with moderate mid and distal stenosis. The PDA has a moderate ostial stenosis. These lesions do not appear to be flow limiting. Recommendations: Will continue Plavix daily. Continue Imdur. She has an ASA allergy. She is statin intolerant. Consider Repatha as an outpatient.    Cardiac Studies:  ECG:    Telemetry:  NSR   Echo:   Medications:    clopidogrel  75 mg Oral Daily   donepezil  5 mg Oral QHS   fluticasone furoate-vilanterol  1 puff Inhalation Daily   And   umeclidinium bromide  1 puff Inhalation Daily   insulin aspart  0-9 Units Subcutaneous TID WC   isosorbide mononitrate  30 mg Oral Daily   levothyroxine  50 mcg Oral Q0600   sertraline  100 mg Oral Daily   sodium chloride flush  3 mL Intravenous Q12H   triamterene-hydrochlorothiazide  1 tablet Oral Daily   zolpidem  5 mg Oral QHS      sodium chloride      Assessment/Plan:   CAD: post DES to OM with mild residual dx Continue DAT and nitrates HR 53 this am no beta blocker needed EF normal by TTE 55-60%  HTN: Well controlled.  Continue  current medications and low sodium Dash type diet.   DM:  per primary resume home meds HLD intolerant to statins will need lipid referral to start PSK9 as outpatient  Charlton Haws 09/02/2021, 9:43 AM

## 2021-09-04 LAB — LIPOPROTEIN A (LPA): Lipoprotein (a): 104.3 nmol/L — ABNORMAL HIGH (ref <75.0)

## 2021-09-04 MED FILL — Nitroglycerin IV Soln 100 MCG/ML in D5W: INTRA_ARTERIAL | Qty: 10 | Status: AC

## 2021-09-23 ENCOUNTER — Emergency Department (HOSPITAL_COMMUNITY): Payer: Medicare Other

## 2021-09-23 ENCOUNTER — Encounter (HOSPITAL_COMMUNITY): Payer: Self-pay | Admitting: *Deleted

## 2021-09-23 ENCOUNTER — Emergency Department (HOSPITAL_COMMUNITY)
Admission: EM | Admit: 2021-09-23 | Discharge: 2021-09-23 | Disposition: A | Discharge status: Home / Self Care | Payer: Medicare Other | Attending: Emergency Medicine | Admitting: Emergency Medicine

## 2021-09-23 ENCOUNTER — Other Ambulatory Visit: Payer: Self-pay

## 2021-09-23 DIAGNOSIS — R0602 Shortness of breath: Secondary | ICD-10-CM | POA: Insufficient documentation

## 2021-09-23 DIAGNOSIS — R0789 Other chest pain: Secondary | ICD-10-CM | POA: Insufficient documentation

## 2021-09-23 DIAGNOSIS — R079 Chest pain, unspecified: Secondary | ICD-10-CM

## 2021-09-23 DIAGNOSIS — Z7901 Long term (current) use of anticoagulants: Secondary | ICD-10-CM | POA: Diagnosis not present

## 2021-09-23 DIAGNOSIS — Z91040 Latex allergy status: Secondary | ICD-10-CM | POA: Diagnosis not present

## 2021-09-23 DIAGNOSIS — R61 Generalized hyperhidrosis: Secondary | ICD-10-CM | POA: Insufficient documentation

## 2021-09-23 DIAGNOSIS — R110 Nausea: Secondary | ICD-10-CM | POA: Diagnosis not present

## 2021-09-23 LAB — CBC WITH DIFFERENTIAL/PLATELET
Abs Immature Granulocytes: 0.06 10*3/uL (ref 0.00–0.07)
Basophils Absolute: 0.1 10*3/uL (ref 0.0–0.1)
Basophils Relative: 1 %
Eosinophils Absolute: 0.4 10*3/uL (ref 0.0–0.5)
Eosinophils Relative: 3 %
HCT: 36.8 % (ref 36.0–46.0)
Hemoglobin: 12.3 g/dL (ref 12.0–15.0)
Immature Granulocytes: 0 %
Lymphocytes Relative: 10 %
Lymphs Abs: 1.4 10*3/uL (ref 0.7–4.0)
MCH: 30.1 pg (ref 26.0–34.0)
MCHC: 33.4 g/dL (ref 30.0–36.0)
MCV: 90 fL (ref 80.0–100.0)
Monocytes Absolute: 0.9 10*3/uL (ref 0.1–1.0)
Monocytes Relative: 7 %
Neutro Abs: 11 10*3/uL — ABNORMAL HIGH (ref 1.7–7.7)
Neutrophils Relative %: 79 %
Platelets: 236 10*3/uL (ref 150–400)
RBC: 4.09 MIL/uL (ref 3.87–5.11)
RDW: 13.7 % (ref 11.5–15.5)
WBC: 13.9 10*3/uL — ABNORMAL HIGH (ref 4.0–10.5)
nRBC: 0 % (ref 0.0–0.2)

## 2021-09-23 LAB — BASIC METABOLIC PANEL
Anion gap: 11 (ref 5–15)
BUN: 23 mg/dL (ref 8–23)
CO2: 22 mmol/L (ref 22–32)
Calcium: 9.1 mg/dL (ref 8.9–10.3)
Chloride: 101 mmol/L (ref 98–111)
Creatinine, Ser: 1.21 mg/dL — ABNORMAL HIGH (ref 0.44–1.00)
GFR, Estimated: 46 mL/min — ABNORMAL LOW (ref >60)
Glucose, Bld: 80 mg/dL (ref 70–99)
Potassium: 4.1 mmol/L (ref 3.5–5.1)
Sodium: 134 mmol/L — ABNORMAL LOW (ref 135–145)

## 2021-09-23 LAB — TROPONIN I (HIGH SENSITIVITY)
Troponin I (High Sensitivity): 4 ng/L (ref <18)
Troponin I (High Sensitivity): 4 ng/L (ref <18)

## 2021-09-23 MED ORDER — SODIUM CHLORIDE 0.9 % IV BOLUS
500.0000 mL | Freq: Once | INTRAVENOUS | Status: AC
  Administered 2021-09-23: 500 mL via INTRAVENOUS

## 2021-09-23 MED ORDER — ASPIRIN 81 MG PO CHEW: 324.0000 mg | CHEWABLE_TABLET | Freq: Once | ORAL | Status: DC

## 2021-09-23 MED ORDER — NITROGLYCERIN 0.4 MG SL SUBL: 0.4000 mg | SUBLINGUAL_TABLET | SUBLINGUAL | Status: DC | PRN

## 2021-09-23 NOTE — ED Provider Notes (Signed)
[facility] EMERGENCY DEPARTMENT Provider Note   CSN: [id number] Arrival date & time: 09/23/21  1410     History  Chief Complaint  Patient presents with   Chest Pain    [name] is a 76 y.o. female.  HPI 76 year old female presents with chest pain.  Started off as a tightness about 1 hour prior to arrival.  She was hanging up a bird feeder/birdseed when it occurred.  Started sweating, having tightness and pressure, and has had nausea and shortness of breath.  Some discomfort in left armpit and left arm.  She received a cardiac stent at the end of last month.  At that point she was not having too many symptoms and this was found on a preoperative work-up.  Currently she is having moderate to severe discomfort.  She has taken a total of 3 nitroglycerin with no relief. She reports compliance with all her meds, including plavix.   Home Medications Prior to Admission medications   Medication Sig Start Date End Date Taking? Authorizing Provider  albuterol (VENTOLIN HFA) 108 (90 Base) MCG/ACT inhaler Inhale 2 puffs into the lungs every 8 (eight) hours as needed (asthma / chest congestion). 04/20/19   [provider]  ALPRAZolam Duanne Moron) 0.25 MG tablet Take 0.25 mg by mouth 2 (two) times daily as needed for anxiety. 08/23/21   [provider]  BREZTRI AEROSPHERE 160-9-4.8 MCG/ACT AERO Inhale 2 puffs into the lungs 2 (two) times daily. 06/16/19   [provider]  clopidogrel (PLAVIX) 75 MG tablet Take 1 tablet (75 mg total) by mouth daily. 08/30/21   Minus Breeding, MD  Cyanocobalamin (B-12 COMPLIANCE INJECTION) 1000 MCG/ML KIT Vitamin B12 1012mg IM injection daily for 7 days, then weekly for 4 weeks, then monthly thereafter for 1 year. Patient taking differently: Inject 1,000 mcg as directed every 30 (thirty) days. 06/16/21   PNarda AmberK, DO  Cyanocobalamin (VITAMIN B-12) 5000 MCG SUBL Place 5,000 mcg under the tongue daily.    [provider]   donepezil (ARICEPT) 5 MG tablet Take 5 mg by mouth at bedtime.    [provider]  Etanercept (ENBREL MINI) 50 MG/ML SOCT Inject 50 mg into the skin once a week. 08/02/21   DBo Merino MD  insulin glargine, 1 Unit Dial, (TOUJEO SOLOSTAR) 300 UNIT/ML Solostar Pen Inject 0-50 Units into the skin See admin instructions. Sliding scale  Under 120=0 Over 120= 10-20 units in the morning Bedtime 20 units if needed, can take up to 50 units depending in Blood glucose  35 units in the morning and 50 units at bedtime.    [provider]  isosorbide mononitrate (IMDUR) 30 MG 24 hr tablet Take 1 tablet (30 mg total) by mouth daily. 08/30/21   HMinus Breeding MD  levothyroxine (SYNTHROID, LEVOTHROID) 50 MCG tablet Take 50 mcg by mouth daily before breakfast.    [provider]  metFORMIN (GLUCOPHAGE) 1000 MG tablet Take 1 tablet (1,000 mg total) by mouth daily with breakfast. 09/04/21   WRichardson DoppT, PA-C  nitroGLYCERIN (NITROSTAT) 0.4 MG SL tablet Place 1 tablet (0.4 mg total) under the tongue every 5 (five) minutes x 3 doses as needed for chest pain. If no relief after 3rd dose, proceed to the ED for an evaluation 08/09/21   HMinus Breeding MD  [facility] test strip 1 each 4 (four) times daily. 11/10/19   [provider]  sertraline (ZOLOFT) 100 MG tablet Take 100 mg by mouth daily.  [provider]  tiZANidine (ZANAFLEX) 2 MG tablet Start 1 tablet at bedtime x 3 days, then increase to 1 tablet twice daily Patient taking differently: Take 2 mg by mouth at bedtime. 06/07/21   Narda Amber K, DO  triamterene-hydrochlorothiazide (MAXZIDE-25) 37.5-25 MG tablet Take 1 tablet by mouth daily.    [provider]  VITAMIN D PO Take 5,000 Units by mouth daily.    [provider]  zolpidem (AMBIEN) 5 MG tablet Take 5 mg by mouth at bedtime. For sleep    [provider]      Allergies    Erythromycin, Penicillins, Gadolinium  derivatives, Iodinated contrast media, Latex, Morphine, Other, Prednisone, Sulfonamide derivatives, Varenicline tartrate, Aspirin, and Atorvastatin    Review of Systems   Review of Systems  Constitutional:  Positive for diaphoresis.  Respiratory:  Positive for shortness of breath.   Cardiovascular:  Positive for chest pain.  Gastrointestinal:  Positive for nausea. Negative for vomiting.    Physical Exam Updated Vital Signs BP 114/64 (BP Location: Left Arm)   Pulse 67   Temp 98 F (36.7 C) (Oral)   Resp 20   Ht 5' 5"  (1.651 m)   Wt 59 kg   SpO2 98%   BMI 21.64 kg/m  Physical Exam Vitals and nursing note reviewed.  Constitutional:      General: She is not in acute distress.    Appearance: She is well-developed. She is not ill-appearing or diaphoretic.  HENT:     Head: Normocephalic and atraumatic.  Cardiovascular:     Rate and Rhythm: Normal rate and regular rhythm.     Pulses:          Radial pulses are 2+ on the right side and 2+ on the left side.     Heart sounds: Normal heart sounds.  Pulmonary:     Effort: Pulmonary effort is normal.     Breath sounds: Normal breath sounds.  Abdominal:     Palpations: Abdomen is soft.     Tenderness: There is no abdominal tenderness.  Skin:    General: Skin is warm and dry.  Neurological:     Mental Status: She is alert.     ED Results / Procedures / Treatments   Labs (all labs ordered are listed, but only abnormal results are displayed) Labs Reviewed  CBC WITH DIFFERENTIAL/PLATELET  BASIC METABOLIC PANEL  TROPONIN I (HIGH SENSITIVITY)    EKG EKG Interpretation  Date/Time:  Saturday September 23 2021 15:19:10 EDT Ventricular Rate:  66 PR Interval:  176 QRS Duration: 85 QT Interval:  493 QTC Calculation: 517 R Axis:   94 Text Interpretation: Sinus rhythm Atrial premature complex Right axis deviation Nonspecific T abnrm, anterolateral leads Prolonged QT interval T waves similar to earlier in the day Confirmed by Sherwood Gambler [id number]) on 09/23/2021 3:23:34 PM  Radiology DG Chest Portable 1 View  Result Date: 09/23/2021 CLINICAL DATA:  Chest pain EXAM: PORTABLE CHEST 1 VIEW COMPARISON:  09/17/2020 FINDINGS: The heart size and mediastinal contours are within normal limits. Both lungs are clear. The visualized skeletal structures are unremarkable. IMPRESSION: No acute abnormality of the lungs in AP portable projection. Electronically Signed   By: Delanna Ahmadi M.D.   On: 09/23/2021 14:57    Procedures Procedures    Medications Ordered in ED Medications  aspirin chewable tablet 324 mg (has no administration in time range)  nitroGLYCERIN (NITROSTAT) SL tablet 0.4 mg (has no administration in time range)  sodium chloride 0.9 %  bolus 500 mL (500 mLs Intravenous New Bag/Given 09/23/21 1526)    ED Course/ Medical Decision Making/ A&P                           Medical Decision Making Amount and/or Complexity of Data Reviewed Labs: ordered. Radiology: ordered.  Risk OTC drugs. Prescription drug management.   Shortly after arrival and prior to any medications being given, patient's pain has acutely resolved.  She is feeling a lot better.  Initial work-up shows a stable nonspecific EKG and normal x-ray without edema.  I personally viewed/interpreted both of these.  Labs are currently pending. Care will be transferred to Dr. Sabra Heck.        Final Clinical Impression(s) / ED Diagnoses Final diagnoses:  None    Rx / DC Orders ED Discharge Orders     None         Sherwood Gambler, MD 09/23/21 1547

## 2021-09-23 NOTE — ED Triage Notes (Signed)
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

## 2021-09-23 NOTE — ED Notes (Signed)
ED Provider at bedside. 

## 2021-09-23 NOTE — Discharge Instructions (Signed)
I have discussed your care with the on-call cardiologist this evening who has recommended that you follow-up for stress test this coming week.  In the meantime you are to take it very easy with rest and relaxation, no exertion and return immediately to the emergency department if you develop recurrent chest pain or worsening symptoms.

## 2021-09-23 NOTE — ED Provider Notes (Signed)
I have personally examined and evaluated this patient.  She has had 2 negative troponins, unremarkable EKG and has been chest pain-free for the last 5 hours or more.  The patient wants to go home, I told her that I would speak with the cardiologist, I spoke with Dr. Welton Flakes of the cardiology service who recommends that the patient should be seen as an outpatient for a stress test and that this would be unlikely to be obstructive disease given 2 negative troponins, symptom-free and an unremarkable EKG.  The patient is completely agreeable to this plan, desires discharge and is going to return should symptoms worsen.  Stable for discharge at this time   Eber Hong, MD 09/23/21 2001

## 2021-09-25 ENCOUNTER — Telehealth: Payer: Self-pay | Admitting: Cardiology

## 2021-09-25 NOTE — Telephone Encounter (Signed)
Patient went to [facility] on Saturday and was there for about 6 hours. She was told she did not have a heart attack but she should have a stress test. She wanted to talk to Dr. Jenene Slicker Nurse to see what  they recommend

## 2021-09-25 NOTE — Telephone Encounter (Signed)
Called patient back. She was advised in the hospital she should have a stress test. However, she does not want to do this if Dr.Hochrein does not agree. ED visits in epic- she requested MD review those records and I advised if he recommended any changes we would call her back. Otherwise, keep upcoming appointment for 08/09 when Dr.Hochrein returns to office.  Patient aware if any other concerns to call us back.  Patient verbalized understanding.

## 2021-10-03 NOTE — Telephone Encounter (Signed)
Called patient, LVM advising of message from MD.  Patient verbalized understanding.

## 2021-10-09 DIAGNOSIS — Z789 Other specified health status: Secondary | ICD-10-CM | POA: Insufficient documentation

## 2021-10-09 DIAGNOSIS — R072 Precordial pain: Secondary | ICD-10-CM | POA: Insufficient documentation

## 2021-10-09 DIAGNOSIS — R9431 Abnormal electrocardiogram [ECG] [EKG]: Secondary | ICD-10-CM | POA: Insufficient documentation

## 2021-10-09 NOTE — Progress Notes (Unsigned)
Cardiology Office Note   Date:  10/11/2021   ID:  [name], DOB January 05, 1946, MRN [id number]  PCP:  Willey Blade, MD  Cardiologist:   Minus Breeding, MD Referring:  Willey Blade, MD  Chief Complaint  Patient presents with   Chest Pain      History of Present Illness: [name] is a 76 y.o. female who presents for evaluation of chest pain.  When I saw her recently she had a markedly abnormal EKG.  She had a cath with NL EF.  There were no significant valve abnormalities.   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   however, she was found to have some obstructive disease as described below on her catheterization and PCI of an obtuse marginal vessel.  She was in the ED since I last saw her.  She had chest pain.  There was no objective evidence of ischemia.  However, it was suggested that she have a stress test.  She did not want to have this until we discussed it.    She said that morning she had some discomfort.  She started with cold sweat.  She took nitroglycerin which she was on her third when she called her sister.  She went to the emergency room she said that since her angioplasty she actually has had less chest discomfort though she still occasionally getting some.  She might get it with activity but she is not having any severe episodes like she had that precipitated the symptoms.  If she does too much she might get a mild discomfort.  She is not having otherwise to take nitroglycerin then involved.  She is not having any new PND or orthopnea.  She is not having any palpitations, presyncope or syncope.    Past Medical History:  Diagnosis Date   Arthritis    "little bit qwhere" (01/24/2012)   Asthma    Chronic back pain    "neck to tailbone" (01/24/2012)   COPD (chronic obstructive pulmonary disease) ([place])    Depression    takes Zoloft daily   Fibromyalgia    History of bladder infections    sees a urologist--on long term  Macrodantin nightly   History of blood transfusion    "when I was a child" (01/24/2012)   Hyperlipidemia    takes Simvastatin daily   Hypertension    takes Maxzide daily   Hypothyroidism    takes Synthroid daily   Insomnia    takes Ambien nightly   Lupus ([place])    Neck pain    herniated disc and radiculopathy   Peripheral neuropathy    Pneumonia 03/05/2010   "have had it a couple times" (01/24/2012)   Type II diabetes mellitus ([place])    takes Janumet daily    Past Surgical History:  Procedure Laterality Date   ANTERIOR CERVICAL DECOMP/DISCECTOMY FUSION  2002; 01/24/2012   ANTERIOR CERVICAL DECOMP/DISCECTOMY FUSION  01/24/2012   Procedure: ANTERIOR CERVICAL DECOMPRESSION/DISCECTOMY FUSION 1 LEVEL;  Surgeon: Ophelia Charter, MD;  Location: [facility];  Service: Neurosurgery;  Laterality: N/A;  CERVICAL THREE-FOUR anterior cervical decompression with fusion interbody prothesis plating and bonegraft   APPENDECTOMY  11/04/1958   BACK SURGERY  03/06/1999   BLADDER SURGERY  03/06/1975   through abdomen   BLADDER SUSPENSION  03/05/1972   BUNIONECTOMY  11/04/1978   left   CARDIAC CATHETERIZATION  03/05/2006   CATARACT EXTRACTION     COLONOSCOPY     CORONARY  STENT INTERVENTION N/A 09/01/2021   Procedure: CORONARY STENT INTERVENTION;  Surgeon: Burnell Blanks, MD;  Location: [facility];  Service: Cardiovascular;  Laterality: N/A;   ESOPHAGOGASTRODUODENOSCOPY     LEFT HEART CATH AND CORONARY ANGIOGRAPHY N/A 09/01/2021   Procedure: LEFT HEART CATH AND CORONARY ANGIOGRAPHY;  Surgeon: Burnell Blanks, MD;  Location: [facility];  Service: Cardiovascular;  Laterality: N/A;   LUMBAR FUSION  03/06/2007   MOUTH SURGERY     growth removed inside mouth per patient   RECTOCELE REPAIR  03/05/1998   VAGINAL HYSTERECTOMY  03/05/1972     Current Outpatient Medications  Medication Sig Dispense Refill   albuterol (VENTOLIN HFA) 108 (90 Base) MCG/ACT inhaler  Inhale 2 puffs into the lungs every 8 (eight) hours as needed (asthma / chest congestion).     ALPRAZolam (XANAX) 0.25 MG tablet Take 0.25 mg by mouth 2 (two) times daily as needed for anxiety.     BREZTRI AEROSPHERE 160-9-4.8 MCG/ACT AERO Inhale 2 puffs into the lungs 2 (two) times daily.     clopidogrel (PLAVIX) 75 MG tablet Take 1 tablet (75 mg total) by mouth daily. 90 tablet 3   Cyanocobalamin (B-12 COMPLIANCE INJECTION) 1000 MCG/ML KIT Vitamin B12 1061mg IM injection daily for 7 days, then weekly for 4 weeks, then monthly thereafter for 1 year. (Patient taking differently: Inject 1,000 mcg as directed every 30 (thirty) days.) 1 kit 0   Cyanocobalamin (VITAMIN B-12) 5000 MCG SUBL Place 5,000 mcg under the tongue daily.     donepezil (ARICEPT) 5 MG tablet Take 5 mg by mouth at bedtime.     Etanercept (ENBREL MINI) 50 MG/ML SOCT Inject 50 mg into the skin once a week. 12 mL 0   insulin glargine, 1 Unit Dial, (TOUJEO SOLOSTAR) 300 UNIT/ML Solostar Pen Inject 0-50 Units into the skin See admin instructions. Sliding scale  Under 120=0 Over 120= 10-20 units in the morning Bedtime 20 units if needed, can take up to 50 units depending in Blood glucose  35 units in the morning and 50 units at bedtime.     isosorbide mononitrate (IMDUR) 60 MG 24 hr tablet Take 1 tablet (60 mg total) by mouth daily. 90 tablet 3   levothyroxine (SYNTHROID, LEVOTHROID) 50 MCG tablet Take 50 mcg by mouth daily before breakfast.     metFORMIN (GLUCOPHAGE) 1000 MG tablet Take 1 tablet (1,000 mg total) by mouth daily with breakfast.     nitroGLYCERIN (NITROSTAT) 0.4 MG SL tablet Place 1 tablet (0.4 mg total) under the tongue every 5 (five) minutes x 3 doses as needed for chest pain. If no relief after 3rd dose, proceed to the ED for an evaluation 25 tablet 3   sertraline (ZOLOFT) 100 MG tablet Take 100 mg by mouth daily.      triamterene-hydrochlorothiazide (MAXZIDE-25) 37.5-25 MG tablet Take 1 tablet by mouth daily.      VITAMIN D PO Take 5,000 Units by mouth daily.     zolpidem (AMBIEN) 5 MG tablet Take 5 mg by mouth at bedtime. For sleep     ONETOUCH VERIO test strip 1 each 4 (four) times daily.     tiZANidine (ZANAFLEX) 2 MG tablet Start 1 tablet at bedtime x 3 days, then increase to 1 tablet twice daily (Patient not taking: Reported on 10/11/2021) 60 tablet 5   No current facility-administered medications for this visit.    Allergies:   Erythromycin, Penicillins, Gadolinium derivatives, Iodinated contrast media, Latex, Morphine, Other,  Prednisone, Sulfonamide derivatives, Varenicline tartrate, Aspirin, and Atorvastatin    ROS:  Please see the history of present illness.   Otherwise, review of systems are positive for none.   All other systems are reviewed and negative.    PHYSICAL EXAM: VS:  BP 110/68   Pulse 80   Ht 5' 5"  (1.651 m)   Wt 130 lb (59 kg)   SpO2 94%   BMI 21.63 kg/m  , BMI Body mass index is 21.63 kg/m. GENERAL:  Well appearing NECK:  No jugular venous distention, waveform within normal limits, carotid upstroke brisk and symmetric, no bruits, no thyromegaly LUNGS:  Clear to auscultation bilaterally CHEST:  Unremarkable HEART:  PMI not displaced or sustained,S1 and S2 within normal limits, no S3, no S4, no clicks, no rubs, no murmurs ABD:  Flat, positive bowel sounds normal in frequency in pitch, no bruits, no rebound, no guarding, no midline pulsatile mass, no hepatomegaly, no splenomegaly EXT:  2 plus pulses throughout, no edema, no cyanosis no clubbing   EKG:  EKG is not ordered today. The ekg ordered 09/23/2021 demonstrates sinus rhythm, rate 66, axis within normal limits, intervals nonspecific T wave changes improved from previous, QT has shortened  Recent Labs: 08/02/2021: ALT 8 09/23/2021: BUN 23; Creatinine, Ser 1.21; Hemoglobin 12.3; Platelets 236; Potassium 4.1; Sodium 134    Lipid Panel    Component Value Date/Time   CHOL (H) 09/07/2006 0605    271        ATP III  CLASSIFICATION:  <200     mg/dL   Desirable  200-239  mg/dL   Borderline High  >=240    mg/dL   High   TRIG 300 (H) 09/07/2006 0605   HDL 31 (L) 09/07/2006 0605   CHOLHDL 8.7 09/07/2006 0605   VLDL 60 (H) 09/07/2006 0605   LDLCALC (H) 09/07/2006 0605    180        Total Cholesterol/HDL:CHD Risk Coronary Heart Disease Risk Table                     Men   Women  1/2 Average Risk   3.4   3.3      Wt Readings from Last 3 Encounters:  10/11/21 130 lb (59 kg)  09/23/21 130 lb 1.1 oz (59 kg)  09/01/21 131 lb (59.4 kg)     CT  1. Left Main: No significant stenosis: LM FFR =0.9.   2. LAD: No hemodynamically significant lesion: Proximal FFR = 0.97, Mid FFR = 0.89, Distal FFR = 0.81.   3. LCX: Possible hemodynamically significant lesion distally: Proximal FFR = 0.98, Mid FFR = 0.97, Distal FFR = 0.79.   4. RCA: No hemodynamically significant lesion: Proximal FFR = 0.97, Mid FFR = 0.93, Distal FFR =0.83.   CATH 09/01/2021  Diagnostic Dominance: Right  Intervention     Other studies Reviewed: Additional studies/ records that were reviewed today include: ED records Review of the above records demonstrates:  Please see elsewhere in the note.     ASSESSMENT AND PLAN:  Chest pain:   The patient does have still some symptoms.  I did review her catheterization films today.  She has lots of diffuse disease and some small vessel disease.  I think this is most amenable to medical therapy and I am going to increase her Imdur to 60 mg.  There is a possibility that this is a nonanginal etiology and she is going to talk to her gastroenterologist as  she feels like some of this might be similar to GI problems she has had in the past.  We will continue however with aggressive risk reduction and med titration.   HTN:   The blood pressure is at target and she will continue the meds as listed.   Type 2 diabetes: She is due to follow-up with her primary provider soon who manages  this.  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  She might consider eventually phytotoxin with PCSK9 inhibitors..  I am going to ask for a repeat lipid profile at her primary provider office and would like to review this.  Current medicines are reviewed at length with the patient today.  The patient does not have concerns regarding medicines.  The following changes have been made: As above  Labs/ tests ordered today include:        No orders of the defined types were placed in this encounter.    Disposition:   FU with me in 2 months  Signed, Minus Breeding, MD  10/11/2021 12:56 PM    [place]

## 2021-10-11 ENCOUNTER — Encounter: Payer: Self-pay | Admitting: Cardiology

## 2021-10-11 ENCOUNTER — Ambulatory Visit: Payer: Medicare Other | Admitting: Cardiology

## 2021-10-11 VITALS — BP 110/68 | HR 80 | Ht 65.0 in | Wt 130.0 lb

## 2021-10-11 DIAGNOSIS — E118 Type 2 diabetes mellitus with unspecified complications: Secondary | ICD-10-CM | POA: Diagnosis not present

## 2021-10-11 DIAGNOSIS — Z789 Other specified health status: Secondary | ICD-10-CM

## 2021-10-11 DIAGNOSIS — R072 Precordial pain: Secondary | ICD-10-CM

## 2021-10-11 DIAGNOSIS — I10 Essential (primary) hypertension: Secondary | ICD-10-CM | POA: Diagnosis not present

## 2021-10-11 DIAGNOSIS — R9431 Abnormal electrocardiogram [ECG] [EKG]: Secondary | ICD-10-CM

## 2021-10-11 MED ORDER — ISOSORBIDE MONONITRATE ER 60 MG PO TB24: 60.0000 mg | ORAL_TABLET | Freq: Every day | ORAL | 3 refills | Status: DC

## 2021-10-11 NOTE — Patient Instructions (Signed)
Medication Instructions:  Please increase your Isosorbide to 60 mg a day. Continue all other medications as listed.  *If you need a refill on your cardiac medications before your next appointment, please call your pharmacy*  Lab Work: Please have a Lipid profile at your primary care MD office.  Please have the results faxed to Dr Antoine Poche at [phone].  If you have labs (blood work) drawn today and your tests are completely normal, you will receive your results only by: MyChart Message (if you have MyChart) OR A paper copy in the mail If you have any lab test that is abnormal or we need to change your treatment, we will call you to review the results.  Follow-Up: At [facility], you and your health needs are our priority.  As part of our continuing mission to provide you with exceptional heart care, we have created designated Provider Care Teams.  These Care Teams include your primary Cardiologist (physician) and Advanced Practice Providers (APPs -  Physician Assistants and Nurse Practitioners) who all work together to provide you with the care you need, when you need it.  We recommend signing up for the patient portal called "MyChart".  Sign up information is provided on this After Visit Summary.  MyChart is used to connect with patients for Virtual Visits (Telemedicine).  Patients are able to view lab/test results, encounter notes, upcoming appointments, etc.  Non-urgent messages can be sent to your provider as well.   To learn more about what you can do with MyChart, go to [email or web address].    Your next appointment:   2 month(s)  The format for your next appointment:   In Person  Provider:   Rollene Rotunda, MD{   Important Information About Sugar

## 2021-10-20 ENCOUNTER — Encounter (INDEPENDENT_AMBULATORY_CARE_PROVIDER_SITE_OTHER): Payer: Self-pay | Admitting: Ophthalmology

## 2021-10-20 ENCOUNTER — Ambulatory Visit (INDEPENDENT_AMBULATORY_CARE_PROVIDER_SITE_OTHER): Payer: Medicare Other | Admitting: Ophthalmology

## 2021-10-20 DIAGNOSIS — H353132 Nonexudative age-related macular degeneration, bilateral, intermediate dry stage: Secondary | ICD-10-CM

## 2021-10-20 DIAGNOSIS — H35033 Hypertensive retinopathy, bilateral: Secondary | ICD-10-CM

## 2021-10-20 DIAGNOSIS — H43811 Vitreous degeneration, right eye: Secondary | ICD-10-CM | POA: Diagnosis not present

## 2021-10-20 DIAGNOSIS — I10 Essential (primary) hypertension: Secondary | ICD-10-CM

## 2021-10-20 DIAGNOSIS — Z961 Presence of intraocular lens: Secondary | ICD-10-CM

## 2021-10-20 DIAGNOSIS — E119 Type 2 diabetes mellitus without complications: Secondary | ICD-10-CM

## 2021-10-20 NOTE — Progress Notes (Signed)
[facility] Note  10/20/2021     CHIEF COMPLAINT Patient presents for Retina Evaluation   HISTORY OF PRESENT ILLNESS: [name] is a 76 y.o. female who presents to the clinic today for:   HPI     Retina Evaluation   In right eye.  This started 5 days ago.  Duration of 5 days.  Associated Symptoms Floaters and Pain.  Negative for Flashes, Distortion, Blind Spot, Redness, Photophobia, Glare, Trauma, Scalp Tenderness, Jaw Claudication, Shoulder/Hip pain, Fever, Weight Loss and Fatigue.  Context:  distance vision, mid-range vision and near vision.  Treatments tried include no treatments.  I, the attending physician,  performed the HPI with the patient and updated documentation appropriately.        Comments   Patient states new onset of floater OD. Unsure if any new flashes. No curtain/veil. Patient states that OD "hurts", but is unable to describe pain. Pain has gotten some worse throughout the day today. Pain started today. BS was 140 this am. Last a1c was "good", but patient doesn't remember number.       Last edited by Bernarda Caffey, MD on 10/20/2021  4:00 PM.    Pt is here for a problems visit for a new large floaters OD, she states she noticed it 5 days ago, she is not sure whether she has seen any fol or not, she states it stays in her central vision and doesn't move, she watching TV last night and felt like she couldn't see out of the eye, pt is also experiencing pain OD  Referring physician: Willey Blade, MD [place],  [place] [address]  HISTORICAL INFORMATION:   Selected notes from the [facility] referral for DEE -- pt of Dr. Katy Fitch [name]:  Ocular Hx- PMH-    CURRENT MEDICATIONS: No current outpatient medications on file. (Ophthalmic Drugs)   No current facility-administered medications for this visit. (Ophthalmic Drugs)   Current Outpatient Medications (Other)  Medication Sig    albuterol (VENTOLIN HFA) 108 (90 Base) MCG/ACT inhaler Inhale 2 puffs into the lungs every 8 (eight) hours as needed (asthma / chest congestion).   ALPRAZolam (XANAX) 0.25 MG tablet Take 0.25 mg by mouth 2 (two) times daily as needed for anxiety.   BREZTRI AEROSPHERE 160-9-4.8 MCG/ACT AERO Inhale 2 puffs into the lungs 2 (two) times daily.   clopidogrel (PLAVIX) 75 MG tablet Take 1 tablet (75 mg total) by mouth daily.   Cyanocobalamin (B-12 COMPLIANCE INJECTION) 1000 MCG/ML KIT Vitamin B12 1070mg IM injection daily for 7 days, then weekly for 4 weeks, then monthly thereafter for 1 year. (Patient taking differently: Inject 1,000 mcg as directed every 30 (thirty) days.)   Cyanocobalamin (VITAMIN B-12) 5000 MCG SUBL Place 5,000 mcg under the tongue daily.   donepezil (ARICEPT) 5 MG tablet Take 5 mg by mouth at bedtime.   Etanercept (ENBREL MINI) 50 MG/ML SOCT Inject 50 mg into the skin once a week.   insulin glargine, 1 Unit Dial, (TOUJEO SOLOSTAR) 300 UNIT/ML Solostar Pen Inject 0-50 Units into the skin See admin instructions. Sliding scale  Under 120=0 Over 120= 10-20 units in the morning Bedtime 20 units if needed, can take up to 50 units depending in Blood glucose  35 units in the morning and 50 units at bedtime.   isosorbide mononitrate (IMDUR) 60 MG 24 hr tablet Take 1 tablet (60 mg total) by mouth daily.   levothyroxine (SYNTHROID, LEVOTHROID) 50  MCG tablet Take 50 mcg by mouth daily before breakfast.   metFORMIN (GLUCOPHAGE) 1000 MG tablet Take 1 tablet (1,000 mg total) by mouth daily with breakfast.   nitroGLYCERIN (NITROSTAT) 0.4 MG SL tablet Place 1 tablet (0.4 mg total) under the tongue every 5 (five) minutes x 3 doses as needed for chest pain. If no relief after 3rd dose, proceed to the ED for an evaluation   ONETOUCH VERIO test strip 1 each 4 (four) times daily.   sertraline (ZOLOFT) 100 MG tablet Take 100 mg by mouth daily.    triamterene-hydrochlorothiazide (MAXZIDE-25) 37.5-25 MG  tablet Take 1 tablet by mouth daily.   VITAMIN D PO Take 5,000 Units by mouth daily.   zolpidem (AMBIEN) 5 MG tablet Take 5 mg by mouth at bedtime. For sleep   tiZANidine (ZANAFLEX) 2 MG tablet Start 1 tablet at bedtime x 3 days, then increase to 1 tablet twice daily (Patient not taking: Reported on 10/11/2021)   No current facility-administered medications for this visit. (Other)   REVIEW OF SYSTEMS: ROS   Positive for: Musculoskeletal, Endocrine, Cardiovascular, Eyes, Respiratory, Psychiatric Negative for: Constitutional, Gastrointestinal, Neurological, Skin, Genitourinary, HENT, Allergic/Imm, Heme/Lymph Last edited by Roselee Nova D, COT on 10/20/2021  2:00 PM.     ALLERGIES Allergies  Allergen Reactions   Erythromycin Anaphylaxis   Penicillins Anaphylaxis   Gadolinium Derivatives Hives and Rash    Pt broke out in hives all over body.  Has IV steroids and Benadryl in hospital for MRI.   Iodinated Contrast Media Hives    13-hour prep Pt had slight itching of her abd after her injection and had taken her 13 hr prep.  Consult with radiologist before another procedure to see if IV contrast is necessary, per Dr Almyra Free on 01/06/14, JB/   Latex Dermatitis and Other (See Comments)    blisters   Morphine Nausea And Vomiting   Other Itching and Other (See Comments)    Chlorhexin (CHG); "burning"   Prednisone Other (See Comments)    "shakes me out of my frame" IM steroids aren't as bad as PO   Sulfonamide Derivatives Itching and Rash    All over body    Varenicline Tartrate Nausea And Vomiting        Aspirin Nausea Only and Other (See Comments)    Burning in stomach    Atorvastatin Other (See Comments)    System will not process   PAST MEDICAL HISTORY Past Medical History:  Diagnosis Date   Arthritis    "little bit qwhere" (01/24/2012)   Asthma    Chronic back pain    "neck to tailbone" (01/24/2012)   COPD (chronic obstructive pulmonary disease) ([place])    Depression    takes  Zoloft daily   Fibromyalgia    History of bladder infections    sees a urologist--on long term Macrodantin nightly   History of blood transfusion    "when I was a child" (01/24/2012)   Hyperlipidemia    takes Simvastatin daily   Hypertension    takes Maxzide daily   Hypothyroidism    takes Synthroid daily   Insomnia    takes Ambien nightly   Lupus ([place])    Neck pain    herniated disc and radiculopathy   Peripheral neuropathy    Pneumonia 03/05/2010   "have had it a couple times" (01/24/2012)   Type II diabetes mellitus ([place])    takes Janumet daily   Past Surgical History:  Procedure Laterality Date  ANTERIOR CERVICAL DECOMP/DISCECTOMY FUSION  2002; 01/24/2012   ANTERIOR CERVICAL DECOMP/DISCECTOMY FUSION  01/24/2012   Procedure: ANTERIOR CERVICAL DECOMPRESSION/DISCECTOMY FUSION 1 LEVEL;  Surgeon: Ophelia Charter, MD;  Location: [facility];  Service: Neurosurgery;  Laterality: N/A;  CERVICAL THREE-FOUR anterior cervical decompression with fusion interbody prothesis plating and bonegraft   APPENDECTOMY  11/04/1958   BACK SURGERY  03/06/1999   BLADDER SURGERY  03/06/1975   through abdomen   BLADDER SUSPENSION  03/05/1972   BUNIONECTOMY  11/04/1978   left   CARDIAC CATHETERIZATION  03/05/2006   CATARACT EXTRACTION     COLONOSCOPY     CORONARY STENT INTERVENTION N/A 09/01/2021   Procedure: CORONARY STENT INTERVENTION;  Surgeon: Burnell Blanks, MD;  Location: [facility];  Service: Cardiovascular;  Laterality: N/A;   ESOPHAGOGASTRODUODENOSCOPY     LEFT HEART CATH AND CORONARY ANGIOGRAPHY N/A 09/01/2021   Procedure: LEFT HEART CATH AND CORONARY ANGIOGRAPHY;  Surgeon: Burnell Blanks, MD;  Location: [facility];  Service: Cardiovascular;  Laterality: N/A;   LUMBAR FUSION  03/06/2007   MOUTH SURGERY     growth removed inside mouth per patient   RECTOCELE REPAIR  03/05/1998   VAGINAL HYSTERECTOMY  03/05/1972   FAMILY HISTORY Family History   Problem Relation Age of Onset   Heart disease Mother 33       Congestive HF, CABG   Arrhythmia Father    Diabetes Sister    Heart disease Sister    Blindness Brother    Heart attack Brother    Diabetes Brother    SOCIAL HISTORY Social History   Tobacco Use   Smoking status: Every Day    Packs/day: 1.00    Years: 60.00    Total pack years: 60.00    Types: Cigarettes    Passive exposure: Never   Smokeless tobacco: Never  Vaping Use   Vaping Use: Former  Substance Use Topics   Alcohol use: No   Drug use: No       OPHTHALMIC EXAM: Base Eye Exam     Visual Acuity (Snellen - Linear)       Right Left   Dist  20/20 -2 20/20         Tonometry (Tonopen, 2:06 PM)       Right Left   Pressure 10 12         Pupils       Dark Light Shape React APD   Right 2 1 Round Minimal None   Left 2 1 Round Minimal None         Visual Fields       Left Right    Full Full         Extraocular Movement       Right Left    Full, Ortho Full, Ortho         Neuro/Psych     Oriented x3: Yes   Mood/Affect: Normal         Dilation     Both eyes: 1.0% Mydriacyl, 2.5% Phenylephrine @ 2:06 PM           Slit Lamp and Fundus Exam     Slit Lamp Exam       Right Left   Lids/Lashes Dermatochalasis - upper lid Dermatochalasis - upper lid   Conjunctiva/Sclera White and quiet White and quiet   Cornea arcus, trace PEE, mild central haze, well healed cataract wound arcus, trace PEE, mild central haze, well healed cataract  wound   Anterior Chamber deep and clear, no cell or flare deep and clear, no cell or flare   Iris Round and moderately dilated Round and moderately dilated   Lens PC IOL in good position PC IOL in good position   Anterior Vitreous Vitreous syneresis, no cell or pigment, Posterior vitreous detachment, Weiss ring Vitreous syneresis, no cell or pigment         Fundus Exam       Right Left   Disc mild Pallor, Sharp rim mild Pallor, Sharp  rim, +SVP   C/D Ratio 0.3 0.4   Macula Flat, Good foveal reflex, Drusen, mild RPE mottling, No heme or edema Flat, Good foveal reflex, Drusen, RPE mottling and clumping, central vitelliform like lesion, No heme or edema   Vessels attenuated, Tortuous attenuated, Tortuous   Periphery Attached, rare peripheral DBH, No RT/RD on 360 scleral depression Attached, No heme            IMAGING AND PROCEDURES  Imaging and Procedures for 10/20/2021  OCT, Retina - OU - Both Eyes       Right Eye Quality was good. Central Foveal Thickness: 290. Progression has been stable. Findings include normal foveal contour, no IRF, no SRF, retinal drusen , vitreomacular adhesion (Interval release of VMA to full PVD -- tr vit opacities).   Left Eye Quality was good. Central Foveal Thickness: 286. Progression has been stable. Findings include normal foveal contour, no IRF, no SRF, retinal drusen , subretinal hyper-reflective material (Mild central SRHM / vitelliform like lesion, no DME; persistent VMA).   Notes *Images captured and stored on drive  Diagnosis / Impression:  NFP; no IRF/SRF OU No DME OU OD: Interval release of VMA to full PVD -- tr vit opacities OS: Mild central SRHM / vitelliform like lesion, no DME   Clinical management:  See below  Abbreviations: NFP - Normal foveal profile. CME - cystoid macular edema. PED - pigment epithelial detachment. IRF - intraretinal fluid. SRF - subretinal fluid. EZ - ellipsoid zone. ERM - epiretinal membrane. ORA - outer retinal atrophy. ORT - outer retinal tubulation. SRHM - subretinal hyper-reflective material. IRHM - intraretinal hyper-reflective material             ASSESSMENT/PLAN:    ICD-10-CM   1. Posterior vitreous detachment of right eye  H43.811 OCT, Retina - OU - Both Eyes    2. Diabetes mellitus type 2 without retinopathy ([place])  E11.9 OCT, Retina - OU - Both Eyes    3. Intermediate stage nonexudative age-related macular degeneration of  both eyes  H35.3132     4. Essential hypertension  I10     5. Hypertensive retinopathy of both eyes  H35.033     6. Pseudophakia of both eyes  Z96.1      1. PVD OD  - subacute symptomatic floaters, onset Sunday night 8.13.23 - Discussed findings and prognosis - No RT or RD on 360 scleral depressed exam - Reviewed s/s of RT/RD - Strict return precautions for any such RT/RD signs/symptoms - f/u in November as scheduled -- DFE/OCT  2. Diabetes mellitus, type 2 without retinopathy  - exam with no MA or heme OU - The incidence, risk factors for progression, natural history and treatment options for diabetic retinopathy  were discussed with patient.   - The need for close monitoring of blood glucose, blood pressure, and serum lipids, avoiding cigarette or any type of tobacco, and the need for long term follow up was also discussed  with patient. - f/u 6-9 months, sooner PRN  3. Age related macular degeneration, non-exudative, both eyes  - intermediate stage with near confluent drusen and early atrophy -- stable  - no exudative disease on exam or OCT  - discussed ARMD as the most likely cause of visual symptoms  - The incidence, anatomy, and pathology of dry AMD, risk of progression, and the AREDS and AREDS 2 study including smoking risks discussed with patient.  - Recommend amsler grid monitoring  - f/u in 6-9 mos  4,5. Hypertensive retinopathy OU - discussed importance of tight BP control - monitor  6. Pseudophakia OU  - s/p CE/IOL OU (R. [place])  - IOLs in good position, doing well  - monitor   Ophthalmic Meds Ordered this visit:  No orders of the defined types were placed in this encounter.     Return for as scheduled in Nov 2023.  There are no Patient Instructions on file for this visit.   Explained the diagnoses, plan, and follow up with the patient and they expressed understanding.  Patient expressed understanding of the importance of proper follow up care.   This  document serves as a record of services personally performed by Gardiner Sleeper, MD, PhD. It was created on their behalf by San Jetty. Owens Shark, OA an ophthalmic technician. The creation of this record is the provider's dictation and/or activities during the visit.    Electronically signed by: San Jetty. Owens Shark, [place] 08.18.2023 4:04 PM  Gardiner Sleeper, M.D., Ph.D. Diseases & Surgery of the Retina and Vitreous [facility]  I have reviewed the above documentation for accuracy and completeness, and I agree with the above. Gardiner Sleeper, M.D., Ph.D. 10/20/21 4:04 PM   Abbreviations: M myopia (nearsighted); A astigmatism; H hyperopia (farsighted); P presbyopia; Mrx spectacle prescription;  CTL contact lenses; OD right eye; OS left eye; OU both eyes  XT exotropia; ET esotropia; PEK punctate epithelial keratitis; PEE punctate epithelial erosions; DES dry eye syndrome; MGD meibomian gland dysfunction; ATs artificial tears; PFAT's preservative free artificial tears; [place] nuclear sclerotic cataract; PSC posterior subcapsular cataract; ERM epi-retinal membrane; PVD posterior vitreous detachment; RD retinal detachment; DM diabetes mellitus; DR diabetic retinopathy; NPDR non-proliferative diabetic retinopathy; PDR proliferative diabetic retinopathy; CSME clinically significant macular edema; DME diabetic macular edema; dbh dot blot hemorrhages; CWS cotton wool spot; POAG primary open angle glaucoma; C/D cup-to-disc ratio; HVF humphrey visual field; GVF goldmann visual field; OCT optical coherence tomography; IOP intraocular pressure; BRVO Branch retinal vein occlusion; CRVO central retinal vein occlusion; CRAO central retinal artery occlusion; BRAO branch retinal artery occlusion; RT retinal tear; SB scleral buckle; PPV pars plana vitrectomy; VH Vitreous hemorrhage; PRP panretinal laser photocoagulation; IVK intravitreal kenalog; VMT vitreomacular traction; MH Macular hole;  NVD neovascularization of  the disc; NVE neovascularization elsewhere; AREDS age related eye disease study; ARMD age related macular degeneration; POAG primary open angle glaucoma; EBMD epithelial/anterior basement membrane dystrophy; ACIOL anterior chamber intraocular lens; IOL intraocular lens; PCIOL posterior chamber intraocular lens; Phaco/IOL phacoemulsification with intraocular lens placement; [place] photorefractive keratectomy; LASIK laser assisted in situ keratomileusis; HTN hypertension; DM diabetes mellitus; COPD chronic obstructive pulmonary disease

## 2021-10-31 ENCOUNTER — Telehealth: Payer: Self-pay | Admitting: Rheumatology

## 2021-10-31 NOTE — Telephone Encounter (Signed)
I called patient, Enbrel auto injector should be mailed out soon.

## 2021-10-31 NOTE — Telephone Encounter (Signed)
Patient called the office requesting a prescription for a new Enbrel auto injector. Patient states hers is broken.  [phone]

## 2021-12-11 NOTE — Progress Notes (Deleted)
Cardiology Office Note   Date:  12/11/2021   ID:  [name], DOB [birth year], MRN [id number]  PCP:  Willey Blade, MD  Cardiologist:   Minus Breeding, MD Referring:  Willey Blade, MD  No chief complaint on file.     History of Present Illness: [name] is a 76 y.o. female who presents for evaluation of chest pain.  When I saw her recently she had a markedly abnormal EKG.  She had a cath with NL EF.  There were no significant valve abnormalities.   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   however, she was found to have some obstructive disease as described below on her catheterization and PCI of an obtuse marginal vessel.  ***   Before the last visit she was in the ED with chest pain.  I saw her for this and added Imdur for medical management. ***  She was in the ED since I last saw her.  She had chest pain.  There was no objective evidence of ischemia.  However, it was suggested that she have a stress test.  She did not want to have this until we discussed it.    She said that morning she had some discomfort.  She started with cold sweat.  She took nitroglycerin which she was on her third when she called her sister.  She went to the emergency room she said that since her angioplasty she actually has had less chest discomfort though she still occasionally getting some.  She might get it with activity but she is not having any severe episodes like she had that precipitated the symptoms.  If she does too much she might get a mild discomfort.  She is not having otherwise to take nitroglycerin then involved.  She is not having any new PND or orthopnea.  She is not having any palpitations, presyncope or syncope.    Past Medical History:  Diagnosis Date   Arthritis    "little bit qwhere" (01/24/2012)   Asthma    Chronic back pain    "neck to tailbone" (01/24/2012)   COPD (chronic obstructive pulmonary disease) ([place])    Depression     takes Zoloft daily   Fibromyalgia    History of bladder infections    sees a urologist--on long term Macrodantin nightly   History of blood transfusion    "when I was a child" (01/24/2012)   Hyperlipidemia    takes Simvastatin daily   Hypertension    takes Maxzide daily   Hypothyroidism    takes Synthroid daily   Insomnia    takes Ambien nightly   Lupus ([place])    Neck pain    herniated disc and radiculopathy   Peripheral neuropathy    Pneumonia 03/05/2010   "have had it a couple times" (01/24/2012)   Type II diabetes mellitus ([place])    takes Janumet daily    Past Surgical History:  Procedure Laterality Date   ANTERIOR CERVICAL DECOMP/DISCECTOMY FUSION  2002; 01/24/2012   ANTERIOR CERVICAL DECOMP/DISCECTOMY FUSION  01/24/2012   Procedure: ANTERIOR CERVICAL DECOMPRESSION/DISCECTOMY FUSION 1 LEVEL;  Surgeon: Ophelia Charter, MD;  Location: [facility];  Service: Neurosurgery;  Laterality: N/A;  CERVICAL THREE-FOUR anterior cervical decompression with fusion interbody prothesis plating and bonegraft   APPENDECTOMY  11/04/1958   BACK SURGERY  03/06/1999   BLADDER SURGERY  03/06/1975   through abdomen   BLADDER SUSPENSION  03/05/1972   BUNIONECTOMY  11/04/1978   left   CARDIAC CATHETERIZATION  03/05/2006   CATARACT EXTRACTION     COLONOSCOPY     CORONARY STENT INTERVENTION N/A 09/01/2021   Procedure: CORONARY STENT INTERVENTION;  Surgeon: Burnell Blanks, MD;  Location: [facility];  Service: Cardiovascular;  Laterality: N/A;   ESOPHAGOGASTRODUODENOSCOPY     LEFT HEART CATH AND CORONARY ANGIOGRAPHY N/A 09/01/2021   Procedure: LEFT HEART CATH AND CORONARY ANGIOGRAPHY;  Surgeon: Burnell Blanks, MD;  Location: [facility];  Service: Cardiovascular;  Laterality: N/A;   LUMBAR FUSION  03/06/2007   MOUTH SURGERY     growth removed inside mouth per patient   RECTOCELE REPAIR  03/05/1998   VAGINAL HYSTERECTOMY  03/05/1972     Current Outpatient  Medications  Medication Sig Dispense Refill   albuterol (VENTOLIN HFA) 108 (90 Base) MCG/ACT inhaler Inhale 2 puffs into the lungs every 8 (eight) hours as needed (asthma / chest congestion).     ALPRAZolam (XANAX) 0.25 MG tablet Take 0.25 mg by mouth 2 (two) times daily as needed for anxiety.     BREZTRI AEROSPHERE 160-9-4.8 MCG/ACT AERO Inhale 2 puffs into the lungs 2 (two) times daily.     clopidogrel (PLAVIX) 75 MG tablet Take 1 tablet (75 mg total) by mouth daily. 90 tablet 3   Cyanocobalamin (B-12 COMPLIANCE INJECTION) 1000 MCG/ML KIT Vitamin B12 1059mg IM injection daily for 7 days, then weekly for 4 weeks, then monthly thereafter for 1 year. (Patient taking differently: Inject 1,000 mcg as directed every 30 (thirty) days.) 1 kit 0   Cyanocobalamin (VITAMIN B-12) 5000 MCG SUBL Place 5,000 mcg under the tongue daily.     donepezil (ARICEPT) 5 MG tablet Take 5 mg by mouth at bedtime.     Etanercept (ENBREL MINI) 50 MG/ML SOCT Inject 50 mg into the skin once a week. 12 mL 0   insulin glargine, 1 Unit Dial, (TOUJEO SOLOSTAR) 300 UNIT/ML Solostar Pen Inject 0-50 Units into the skin See admin instructions. Sliding scale  Under 120=0 Over 120= 10-20 units in the morning Bedtime 20 units if needed, can take up to 50 units depending in Blood glucose  35 units in the morning and 50 units at bedtime.     isosorbide mononitrate (IMDUR) 60 MG 24 hr tablet Take 1 tablet (60 mg total) by mouth daily. 90 tablet 3   levothyroxine (SYNTHROID, LEVOTHROID) 50 MCG tablet Take 50 mcg by mouth daily before breakfast.     metFORMIN (GLUCOPHAGE) 1000 MG tablet Take 1 tablet (1,000 mg total) by mouth daily with breakfast.     nitroGLYCERIN (NITROSTAT) 0.4 MG SL tablet Place 1 tablet (0.4 mg total) under the tongue every 5 (five) minutes x 3 doses as needed for chest pain. If no relief after 3rd dose, proceed to the ED for an evaluation 25 tablet 3   ONETOUCH VERIO test strip 1 each 4 (four) times daily.      sertraline (ZOLOFT) 100 MG tablet Take 100 mg by mouth daily.      tiZANidine (ZANAFLEX) 2 MG tablet Start 1 tablet at bedtime x 3 days, then increase to 1 tablet twice daily (Patient not taking: Reported on 10/11/2021) 60 tablet 5   triamterene-hydrochlorothiazide (MAXZIDE-25) 37.5-25 MG tablet Take 1 tablet by mouth daily.     VITAMIN D PO Take 5,000 Units by mouth daily.     zolpidem (AMBIEN) 5 MG tablet Take 5 mg by mouth at bedtime. For sleep  No current facility-administered medications for this visit.    Allergies:   Erythromycin, Penicillins, Gadolinium derivatives, Iodinated contrast media, Latex, Morphine, Other, Prednisone, Sulfonamide derivatives, Varenicline tartrate, Aspirin, and Atorvastatin    ROS:  Please see the history of present illness.   Otherwise, review of systems are positive for ***.   All other systems are reviewed and negative.    PHYSICAL EXAM: VS:  There were no vitals taken for this visit. , BMI There is no height or weight on file to calculate BMI. GENERAL:  Well appearing NECK:  No jugular venous distention, waveform within normal limits, carotid upstroke brisk and symmetric, no bruits, no thyromegaly LUNGS:  Clear to auscultation bilaterally CHEST:  Unremarkable HEART:  PMI not displaced or sustained,S1 and S2 within normal limits, no S3, no S4, no clicks, no rubs, *** murmurs ABD:  Flat, positive bowel sounds normal in frequency in pitch, no bruits, no rebound, no guarding, no midline pulsatile mass, no hepatomegaly, no splenomegaly EXT:  2 plus pulses throughout, no edema, no cyanosis no clubbing    ***GENERAL:  Well appearing NECK:  No jugular venous distention, waveform within normal limits, carotid upstroke brisk and symmetric, no bruits, no thyromegaly LUNGS:  Clear to auscultation bilaterally CHEST:  Unremarkable HEART:  PMI not displaced or sustained,S1 and S2 within normal limits, no S3, no S4, no clicks, no rubs, no murmurs ABD:  Flat,  positive bowel sounds normal in frequency in pitch, no bruits, no rebound, no guarding, no midline pulsatile mass, no hepatomegaly, no splenomegaly EXT:  2 plus pulses throughout, no edema, no cyanosis no clubbing   EKG:  EKG is *** ordered today. The ekg ordered 09/23/2021 demonstrates sinus rhythm, rate ***, axis within normal limits, intervals nonspecific T wave changes improved from previous, QT has shortened  Recent Labs: 08/02/2021: ALT 8 09/23/2021: BUN 23; Creatinine, Ser 1.21; Hemoglobin 12.3; Platelets 236; Potassium 4.1; Sodium 134    Lipid Panel    Component Value Date/Time   CHOL (H) 09/07/2006 0605    271        ATP III CLASSIFICATION:  <200     mg/dL   Desirable  200-239  mg/dL   Borderline High  >=240    mg/dL   High   TRIG 300 (H) 09/07/2006 0605   HDL 31 (L) 09/07/2006 0605   CHOLHDL 8.7 09/07/2006 0605   VLDL 60 (H) 09/07/2006 0605   LDLCALC (H) 09/07/2006 0605    180        Total Cholesterol/HDL:CHD Risk Coronary Heart Disease Risk Table                     Men   Women  1/2 Average Risk   3.4   3.3      Wt Readings from Last 3 Encounters:  10/11/21 130 lb (59 kg)  09/23/21 130 lb 1.1 oz (59 kg)  09/01/21 131 lb (59.4 kg)     CT  1. Left Main: No significant stenosis: LM FFR =0.9.   2. LAD: No hemodynamically significant lesion: Proximal FFR = 0.97, Mid FFR = 0.89, Distal FFR = 0.81.   3. LCX: Possible hemodynamically significant lesion distally: Proximal FFR = 0.98, Mid FFR = 0.97, Distal FFR = 0.79.   4. RCA: No hemodynamically significant lesion: Proximal FFR = 0.97, Mid FFR = 0.93, Distal FFR =0.83.   CATH 09/01/2021  Diagnostic Dominance: Right  Intervention     Other studies Reviewed: Additional studies/ records that  were reviewed today include: *** Review of the above records demonstrates:  Please see elsewhere in the note.     ASSESSMENT AND PLAN:  Chest pain:   ***   The patient does have still some symptoms.  I did review  her catheterization films today.  She has lots of diffuse disease and some small vessel disease.  I think this is most amenable to medical therapy and I am going to increase her Imdur to 60 mg.  There is a possibility that this is a nonanginal etiology and she is going to talk to her gastroenterologist as she feels like some of this might be similar to GI problems she has had in the past.  We will continue however with aggressive risk reduction and med titration.   HTN:   The blood pressure is *** at target and she will continue the meds as listed.   Type 2 diabetes: ***  She is due to follow-up with her primary provider soon who manages this.  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  She might consider eventually phytotoxin with PCSK9 inhibitors..  I am going to ask for a repeat lipid profile at her primary provider office and would like to review this.  Current medicines are reviewed at length with the patient today.  The patient does not have concerns regarding medicines.  The following changes have been made: ***  Labs/ tests ordered today include:        No orders of the defined types were placed in this encounter.    Disposition:   FU with me in *** months  Signed, Minus Breeding, MD  12/11/2021 8:43 PM    [place]

## 2021-12-13 ENCOUNTER — Ambulatory Visit: Payer: Medicare Other | Admitting: Cardiology

## 2021-12-13 DIAGNOSIS — R072 Precordial pain: Secondary | ICD-10-CM

## 2021-12-13 DIAGNOSIS — E118 Type 2 diabetes mellitus with unspecified complications: Secondary | ICD-10-CM

## 2021-12-13 DIAGNOSIS — I10 Essential (primary) hypertension: Secondary | ICD-10-CM

## 2021-12-15 ENCOUNTER — Other Ambulatory Visit: Payer: Self-pay | Admitting: Rheumatology

## 2021-12-15 MED ORDER — ENBREL MINI 50 MG/ML ~~LOC~~ SOCT: 50.0000 mg | SUBCUTANEOUS | 0 refills | Status: DC

## 2021-12-15 NOTE — Telephone Encounter (Signed)
Patient called the office requesting a refill of Enbrel 50mg/ml be sent to [facility]. 

## 2021-12-15 NOTE — Telephone Encounter (Signed)
Next Visit: 01/02/2022  Last Visit: 08/02/2021  Last Fill: 08/02/2021  GN:[id number] rheumatoid arthritis   Current Dose per office note 08/02/2021: Enbrel 50 mg sq injections once weekly  Labs: 09/23/2021 WBC 13.9, Neutro Abs 11.0, Sodium 134, Creat. 1.21, GFR 46  TB Gold: 08/02/2021 Neg    Okay to refill Enbrel?

## 2021-12-20 NOTE — Progress Notes (Deleted)
Office Visit Note  Patient: [name]             Date of Birth: 1945-04-25           MRN: [id number]             PCP: Willey Blade, MD Referring: Willey Blade, MD Visit Date: 01/02/2022 Occupation: @GUAROCC @  Subjective:  No chief complaint on file.   History of Present Illness: [name] is a 76 y.o. female ***   Activities of Daily Living:  Patient reports morning stiffness for *** {minute/hour:19697}.   Patient {ACTIONS;DENIES/REPORTS:21021675::"Denies"} nocturnal pain.  Difficulty dressing/grooming: {ACTIONS;DENIES/REPORTS:21021675::"Denies"} Difficulty climbing stairs: {ACTIONS;DENIES/REPORTS:21021675::"Denies"} Difficulty getting out of chair: {ACTIONS;DENIES/REPORTS:21021675::"Denies"} Difficulty using hands for taps, buttons, cutlery, and/or writing: {ACTIONS;DENIES/REPORTS:21021675::"Denies"}  No Rheumatology ROS completed.   PMFS History:  Patient Active Problem List   Diagnosis Date Noted  . Statin intolerance 10/09/2021  . Nonspecific abnormal electrocardiogram (ECG) (EKG) 10/09/2021  . Precordial chest pain 10/09/2021  . CAD (coronary artery disease) 09/02/2021  . Unstable angina ([place])   . Asthma 11/27/2019  . 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 Gastroesophageal reflux disease without esophagitis 11/27/2019  . Generalized anxiety disorder 11/27/2019  . Hypothyroidism 11/27/2019  . Smoker 11/27/2019  . Type 2 diabetes mellitus without complication ([place]) 29/92/4268  . Varicose veins of lower extremity 11/27/2019  . Vitamin D deficiency 11/27/2019  . Arthritis of hand 11/05/2019  . Inflammation of joint of both hands 11/05/2019  . Encounter for orthopedic follow-up care 01/09/2019  . Tenosynovitis of fingers 09/22/2018  . Tenosynovitis of right wrist 09/22/2018  . Pain in right hand 09/08/2018  . Nonintractable headache 08/16/2016  . Carotid artery tenderness 10/26/2015  . Leg pain, left 07/27/2014  . Back pain of  thoracolumbar region 08/11/2012  . Renal cyst 08/04/2012  . Cervical spondylosis with radiculopathy 01/24/2012  . Chronic interstitial cystitis 11/01/2011  . Mixed incontinence 11/01/2011  . EMPHYSEMA 08/05/2008  . LEUKOCYTOSIS 06/25/2008  . COPD 06/25/2008  . GASTROPARESIS 06/25/2008  . DM 06/14/2008  . Hyperlipidemia LDL goal <70 06/14/2008  . Essential hypertension 06/14/2008  . ASTHMATIC BRONCHITIS, ACUTE 06/14/2008  . ALLERGIC RHINITIS 06/14/2008  . LUPUS 06/14/2008    Past Medical History:  Diagnosis Date  . Arthritis    "little bit qwhere" (01/24/2012)  . Asthma   . Chronic back pain    "neck to tailbone" (01/24/2012)  . COPD (chronic obstructive pulmonary disease) ([place])   . Depression    takes Zoloft daily  . Fibromyalgia   . History of bladder infections    sees a urologist--on long term Macrodantin nightly  . History of blood transfusion    "when I was a child" (01/24/2012)  . Hyperlipidemia    takes Simvastatin daily  . Hypertension    takes Maxzide daily  . Hypothyroidism    takes Synthroid daily  . Insomnia    takes Ambien nightly  . Lupus ([place])   . Neck pain    herniated disc and radiculopathy  . Peripheral neuropathy   . Pneumonia 03/05/2010   "have had it a couple times" (01/24/2012)  . Type II diabetes mellitus ([place])    takes Janumet daily    Family History  Problem Relation Age of Onset  . Heart disease Mother 90       Congestive HF, CABG  . Arrhythmia Father   . Diabetes Sister   . Heart disease Sister   . Blindness Brother   . Heart attack Brother   .  Diabetes Brother    Past Surgical History:  Procedure Laterality Date  . ANTERIOR CERVICAL DECOMP/DISCECTOMY FUSION  2002; 01/24/2012  . ANTERIOR CERVICAL DECOMP/DISCECTOMY FUSION  01/24/2012   Procedure: ANTERIOR CERVICAL DECOMPRESSION/DISCECTOMY FUSION 1 LEVEL;  Surgeon: Cristi Loron, MD;  Location: [facility];  Service: Neurosurgery;  Laterality: N/A;  CERVICAL THREE-FOUR  anterior cervical decompression with fusion interbody prothesis plating and bonegraft  . APPENDECTOMY  11/04/1958  . BACK SURGERY  03/06/1999  . BLADDER SURGERY  03/06/1975   through abdomen  . BLADDER SUSPENSION  03/05/1972  . BUNIONECTOMY  11/04/1978   left  . CARDIAC CATHETERIZATION  03/05/2006  . CATARACT EXTRACTION    . COLONOSCOPY    . CORONARY STENT INTERVENTION N/A 09/01/2021   Procedure: CORONARY STENT INTERVENTION;  Surgeon: Kathleene Hazel, MD;  Location: [facility];  Service: Cardiovascular;  Laterality: N/A;  . ESOPHAGOGASTRODUODENOSCOPY    . LEFT HEART CATH AND CORONARY ANGIOGRAPHY N/A 09/01/2021   Procedure: LEFT HEART CATH AND CORONARY ANGIOGRAPHY;  Surgeon: Kathleene Hazel, MD;  Location: [facility];  Service: Cardiovascular;  Laterality: N/A;  . LUMBAR FUSION  03/06/2007  . MOUTH SURGERY     growth removed inside mouth per patient  . RECTOCELE REPAIR  03/05/1998  . VAGINAL HYSTERECTOMY  03/05/1972   Social History   Social History Narrative   Right Handed    Lives in a one story home    Immunization History  Administered Date(s) Administered  . Influenza Split 01/25/2012  . Influenza, High Dose Seasonal PF 01/01/2017  . Influenza-Unspecified 12/03/2012  . Pneumococcal Conjugate-13 12/17/2017  . Pneumococcal Polysaccharide-23 01/25/2012  . Tdap 04/14/2014     Objective: Vital Signs: There were no vitals taken for this visit.   Physical Exam   Musculoskeletal Exam: ***  CDAI Exam: CDAI Score: -- Patient Global: --; Provider Global: -- Swollen: --; Tender: -- Joint Exam 01/02/2022   No joint exam has been documented for this visit   There is currently no information documented on the homunculus. Go to the Rheumatology activity and complete the homunculus joint exam.  Investigation: No additional findings.  Imaging: No results found.  Recent Labs: Lab Results  Component Value Date   WBC 13.9 (H) 09/23/2021    HGB 12.3 09/23/2021   PLT 236 09/23/2021   NA 134 (L) 09/23/2021   K 4.1 09/23/2021   CL 101 09/23/2021   CO2 22 09/23/2021   GLUCOSE 80 09/23/2021   BUN 23 09/23/2021   CREATININE 1.21 (H) 09/23/2021   BILITOT 0.6 08/02/2021   ALKPHOS 71 09/17/2020   AST 14 08/02/2021   ALT 8 08/02/2021   PROT 7.3 08/02/2021   ALBUMIN 3.9 09/17/2020   CALCIUM 9.1 09/23/2021   GFRAA 52 (L) 06/16/2020   QFTBGOLDPLUS NEGATIVE 08/02/2021    Speciality Comments: No specialty comments available.  Procedures:  No procedures performed Allergies: Erythromycin, Penicillins, Gadolinium derivatives, Iodinated contrast media, Latex, Morphine, Other, Prednisone, Sulfonamide derivatives, Varenicline tartrate, Aspirin, and Atorvastatin   Assessment / Plan:     Visit Diagnoses: No diagnosis found.  Orders: No orders of the defined types were placed in this encounter.  No orders of the defined types were placed in this encounter.   Face-to-face time spent with patient was *** minutes. Greater than 50% of time was spent in counseling and coordination of care.  Follow-Up Instructions: No follow-ups on file.   Ellen Henri, CMA  Note - This record has been created  using Editor, commissioning.  Chart creation errors have been sought, but may not always  have been located. Such creation errors do not reflect on  the standard of medical care.

## 2022-01-01 NOTE — Progress Notes (Deleted)
Office Visit Note  Patient: [name]             Date of Birth: October 20, 1945           MRN: [id number]             PCP: Willey Blade, MD Referring: Willey Blade, MD Visit Date: 01/10/2022 Occupation: @GUAROCC @  Subjective:  No chief complaint on file.   History of Present Illness: [name] is a 76 y.o. female ***   Activities of Daily Living:  Patient reports morning stiffness for *** {minute/hour:19697}.   Patient {ACTIONS;DENIES/REPORTS:21021675::"Denies"} nocturnal pain.  Difficulty dressing/grooming: {ACTIONS;DENIES/REPORTS:21021675::"Denies"} Difficulty climbing stairs: {ACTIONS;DENIES/REPORTS:21021675::"Denies"} Difficulty getting out of chair: {ACTIONS;DENIES/REPORTS:21021675::"Denies"} Difficulty using hands for taps, buttons, cutlery, and/or writing: {ACTIONS;DENIES/REPORTS:21021675::"Denies"}  No Rheumatology ROS completed.   PMFS History:  Patient Active Problem List   Diagnosis Date Noted  . Statin intolerance 10/09/2021  . Nonspecific abnormal electrocardiogram (ECG) (EKG) 10/09/2021  . Precordial chest pain 10/09/2021  . CAD (coronary artery disease) 09/02/2021  . Unstable angina ([place])   . Asthma 11/27/2019  . 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 Gastroesophageal reflux disease without esophagitis 11/27/2019  . Generalized anxiety disorder 11/27/2019  . Hypothyroidism 11/27/2019  . Smoker 11/27/2019  . Type 2 diabetes mellitus without complication ([place]) 27/78/2423  . Varicose veins of lower extremity 11/27/2019  . Vitamin D deficiency 11/27/2019  . Arthritis of hand 11/05/2019  . Inflammation of joint of both hands 11/05/2019  . Encounter for orthopedic follow-up care 01/09/2019  . Tenosynovitis of fingers 09/22/2018  . Tenosynovitis of right wrist 09/22/2018  . Pain in right hand 09/08/2018  . Nonintractable headache 08/16/2016  . Carotid artery tenderness 10/26/2015  . Leg pain, left 07/27/2014  . Back pain of  thoracolumbar region 08/11/2012  . Renal cyst 08/04/2012  . Cervical spondylosis with radiculopathy 01/24/2012  . Chronic interstitial cystitis 11/01/2011  . Mixed incontinence 11/01/2011  . EMPHYSEMA 08/05/2008  . LEUKOCYTOSIS 06/25/2008  . COPD 06/25/2008  . GASTROPARESIS 06/25/2008  . DM 06/14/2008  . Hyperlipidemia LDL goal <70 06/14/2008  . Essential hypertension 06/14/2008  . ASTHMATIC BRONCHITIS, ACUTE 06/14/2008  . ALLERGIC RHINITIS 06/14/2008  . LUPUS 06/14/2008    Past Medical History:  Diagnosis Date  . Arthritis    "little bit qwhere" (01/24/2012)  . Asthma   . Chronic back pain    "neck to tailbone" (01/24/2012)  . COPD (chronic obstructive pulmonary disease) ([place])   . Depression    takes Zoloft daily  . Fibromyalgia   . History of bladder infections    sees a urologist--on long term Macrodantin nightly  . History of blood transfusion    "when I was a child" (01/24/2012)  . Hyperlipidemia    takes Simvastatin daily  . Hypertension    takes Maxzide daily  . Hypothyroidism    takes Synthroid daily  . Insomnia    takes Ambien nightly  . Lupus ([place])   . Neck pain    herniated disc and radiculopathy  . Peripheral neuropathy   . Pneumonia 03/05/2010   "have had it a couple times" (01/24/2012)  . Type II diabetes mellitus ([place])    takes Janumet daily    Family History  Problem Relation Age of Onset  . Heart disease Mother 59       Congestive HF, CABG  . Arrhythmia Father   . Diabetes Sister   . Heart disease Sister   . Blindness Brother   . Heart attack Brother   .  Diabetes Brother    Past Surgical History:  Procedure Laterality Date  . ANTERIOR CERVICAL DECOMP/DISCECTOMY FUSION  2002; 01/24/2012  . ANTERIOR CERVICAL DECOMP/DISCECTOMY FUSION  01/24/2012   Procedure: ANTERIOR CERVICAL DECOMPRESSION/DISCECTOMY FUSION 1 LEVEL;  Surgeon: Cristi Loron, MD;  Location: [facility];  Service: Neurosurgery;  Laterality: N/A;  CERVICAL THREE-FOUR  anterior cervical decompression with fusion interbody prothesis plating and bonegraft  . APPENDECTOMY  11/04/1958  . BACK SURGERY  03/06/1999  . BLADDER SURGERY  03/06/1975   through abdomen  . BLADDER SUSPENSION  03/05/1972  . BUNIONECTOMY  11/04/1978   left  . CARDIAC CATHETERIZATION  03/05/2006  . CATARACT EXTRACTION    . COLONOSCOPY    . CORONARY STENT INTERVENTION N/A 09/01/2021   Procedure: CORONARY STENT INTERVENTION;  Surgeon: Kathleene Hazel, MD;  Location: [facility];  Service: Cardiovascular;  Laterality: N/A;  . ESOPHAGOGASTRODUODENOSCOPY    . LEFT HEART CATH AND CORONARY ANGIOGRAPHY N/A 09/01/2021   Procedure: LEFT HEART CATH AND CORONARY ANGIOGRAPHY;  Surgeon: Kathleene Hazel, MD;  Location: [facility];  Service: Cardiovascular;  Laterality: N/A;  . LUMBAR FUSION  03/06/2007  . MOUTH SURGERY     growth removed inside mouth per patient  . RECTOCELE REPAIR  03/05/1998  . VAGINAL HYSTERECTOMY  03/05/1972   Social History   Social History Narrative   Right Handed    Lives in a one story home    Immunization History  Administered Date(s) Administered  . Influenza Split 01/25/2012  . Influenza, High Dose Seasonal PF 01/01/2017  . Influenza-Unspecified 12/03/2012  . Pneumococcal Conjugate-13 12/17/2017  . Pneumococcal Polysaccharide-23 01/25/2012  . Tdap 04/14/2014     Objective: Vital Signs: There were no vitals taken for this visit.   Physical Exam   Musculoskeletal Exam: ***  CDAI Exam: CDAI Score: -- Patient Global: --; Provider Global: -- Swollen: --; Tender: -- Joint Exam 01/10/2022   No joint exam has been documented for this visit   There is currently no information documented on the homunculus. Go to the Rheumatology activity and complete the homunculus joint exam.  Investigation: No additional findings.  Imaging: No results found.  Recent Labs: Lab Results  Component Value Date   WBC 13.9 (H) 09/23/2021    HGB 12.3 09/23/2021   PLT 236 09/23/2021   NA 134 (L) 09/23/2021   K 4.1 09/23/2021   CL 101 09/23/2021   CO2 22 09/23/2021   GLUCOSE 80 09/23/2021   BUN 23 09/23/2021   CREATININE 1.21 (H) 09/23/2021   BILITOT 0.6 08/02/2021   ALKPHOS 71 09/17/2020   AST 14 08/02/2021   ALT 8 08/02/2021   PROT 7.3 08/02/2021   ALBUMIN 3.9 09/17/2020   CALCIUM 9.1 09/23/2021   GFRAA 52 (L) 06/16/2020   QFTBGOLDPLUS NEGATIVE 08/02/2021    Speciality Comments: No specialty comments available.  Procedures:  No procedures performed Allergies: Erythromycin, Penicillins, Gadolinium derivatives, Iodinated contrast media, Latex, Morphine, Other, Prednisone, Sulfonamide derivatives, Varenicline tartrate, Aspirin, and Atorvastatin   Assessment / Plan:     Visit Diagnoses: No diagnosis found.  Orders: No orders of the defined types were placed in this encounter.  No orders of the defined types were placed in this encounter.   Face-to-face time spent with patient was *** minutes. Greater than 50% of time was spent in counseling and coordination of care.  Follow-Up Instructions: No follow-ups on file.   Ellen Henri, CMA  Note - This record has been created  using Editor, commissioning.  Chart creation errors have been sought, but may not always  have been located. Such creation errors do not reflect on  the standard of medical care.

## 2022-01-02 ENCOUNTER — Ambulatory Visit: Payer: Medicare Other | Admitting: Physician Assistant

## 2022-01-02 DIAGNOSIS — Z8639 Personal history of other endocrine, nutritional and metabolic disease: Secondary | ICD-10-CM

## 2022-01-02 DIAGNOSIS — F17200 Nicotine dependence, unspecified, uncomplicated: Secondary | ICD-10-CM

## 2022-01-02 DIAGNOSIS — M5030 Other cervical disc degeneration, unspecified cervical region: Secondary | ICD-10-CM

## 2022-01-02 DIAGNOSIS — G629 Polyneuropathy, unspecified: Secondary | ICD-10-CM

## 2022-01-02 DIAGNOSIS — Z8709 Personal history of other diseases of the respiratory system: Secondary | ICD-10-CM

## 2022-01-02 DIAGNOSIS — L930 Discoid lupus erythematosus: Secondary | ICD-10-CM

## 2022-01-02 DIAGNOSIS — M67449 Ganglion, unspecified hand: Secondary | ICD-10-CM

## 2022-01-02 DIAGNOSIS — Z8739 Personal history of other diseases of the musculoskeletal system and connective tissue: Secondary | ICD-10-CM

## 2022-01-02 DIAGNOSIS — M797 Fibromyalgia: Secondary | ICD-10-CM

## 2022-01-02 DIAGNOSIS — I10 Essential (primary) hypertension: Secondary | ICD-10-CM

## 2022-01-02 DIAGNOSIS — M0600 Rheumatoid arthritis without rheumatoid factor, unspecified site: Secondary | ICD-10-CM

## 2022-01-02 DIAGNOSIS — E538 Deficiency of other specified B group vitamins: Secondary | ICD-10-CM

## 2022-01-02 DIAGNOSIS — M5136 Other intervertebral disc degeneration, lumbar region: Secondary | ICD-10-CM

## 2022-01-02 DIAGNOSIS — Z79899 Other long term (current) drug therapy: Secondary | ICD-10-CM

## 2022-01-02 DIAGNOSIS — Z8659 Personal history of other mental and behavioral disorders: Secondary | ICD-10-CM

## 2022-01-02 DIAGNOSIS — M659 Synovitis and tenosynovitis, unspecified: Secondary | ICD-10-CM

## 2022-01-09 NOTE — Progress Notes (Shared)
[facility] Note  01/10/2022     CHIEF COMPLAINT Patient presents for No chief complaint on file.   HISTORY OF PRESENT ILLNESS: [name] is a 76 y.o. female who presents to the clinic today for:     Referring physician: Willey Blade, MD [address] [place],  [place] [phone]  HISTORICAL INFORMATION:   Selected notes from the [facility] referral for DEE -- pt of Dr. Katy Fitch [name]:  Ocular Hx- PMH-    CURRENT MEDICATIONS: No current outpatient medications on file. (Ophthalmic Drugs)   No current facility-administered medications for this visit. (Ophthalmic Drugs)   Current Outpatient Medications (Other)  Medication Sig   albuterol (VENTOLIN HFA) 108 (90 Base) MCG/ACT inhaler Inhale 2 puffs into the lungs every 8 (eight) hours as needed (asthma / chest congestion).   ALPRAZolam (XANAX) 0.25 MG tablet Take 0.25 mg by mouth 2 (two) times daily as needed for anxiety.   BREZTRI AEROSPHERE 160-9-4.8 MCG/ACT AERO Inhale 2 puffs into the lungs 2 (two) times daily.   clopidogrel (PLAVIX) 75 MG tablet Take 1 tablet (75 mg total) by mouth daily.   Cyanocobalamin (B-12 COMPLIANCE INJECTION) 1000 MCG/ML KIT Vitamin B12 1023mg IM injection daily for 7 days, then weekly for 4 weeks, then monthly thereafter for 1 year. (Patient taking differently: Inject 1,000 mcg as directed every 30 (thirty) days.)   Cyanocobalamin (VITAMIN B-12) 5000 MCG SUBL Place 5,000 mcg under the tongue daily.   donepezil (ARICEPT) 5 MG tablet Take 5 mg by mouth at bedtime.   Etanercept (ENBREL MINI) 50 MG/ML SOCT Inject 50 mg into the skin once a week.   insulin glargine, 1 Unit Dial, (TOUJEO SOLOSTAR) 300 UNIT/ML Solostar Pen Inject 0-50 Units into the skin See admin instructions. Sliding scale  Under 120=0 Over 120= 10-20 units in the morning Bedtime 20 units if needed, can take up to 50 units depending in Blood glucose  35 units in the  morning and 50 units at bedtime.   isosorbide mononitrate (IMDUR) 60 MG 24 hr tablet Take 1 tablet (60 mg total) by mouth daily.   levothyroxine (SYNTHROID, LEVOTHROID) 50 MCG tablet Take 50 mcg by mouth daily before breakfast.   metFORMIN (GLUCOPHAGE) 1000 MG tablet Take 1 tablet (1,000 mg total) by mouth daily with breakfast.   nitroGLYCERIN (NITROSTAT) 0.4 MG SL tablet Place 1 tablet (0.4 mg total) under the tongue every 5 (five) minutes x 3 doses as needed for chest pain. If no relief after 3rd dose, proceed to the ED for an evaluation   ONETOUCH VERIO test strip 1 each 4 (four) times daily.   sertraline (ZOLOFT) 100 MG tablet Take 100 mg by mouth daily.    tiZANidine (ZANAFLEX) 2 MG tablet Start 1 tablet at bedtime x 3 days, then increase to 1 tablet twice daily (Patient not taking: Reported on 10/11/2021)   triamterene-hydrochlorothiazide (MAXZIDE-25) 37.5-25 MG tablet Take 1 tablet by mouth daily.   VITAMIN D PO Take 5,000 Units by mouth daily.   zolpidem (AMBIEN) 5 MG tablet Take 5 mg by mouth at bedtime. For sleep   No current facility-administered medications for this visit. (Other)   REVIEW OF SYSTEMS:   ALLERGIES Allergies  Allergen Reactions   Erythromycin Anaphylaxis   Penicillins Anaphylaxis   Gadolinium Derivatives Hives and Rash    Pt broke out in hives all over body.  Has IV steroids and Benadryl in hospital for MRI.   Iodinated  Contrast Media Hives    13-hour prep Pt had slight itching of her abd after her injection and had taken her 13 hr prep.  Consult with radiologist before another procedure to see if IV contrast is necessary, per Dr Almyra Free on 01/06/14, JB/   Latex Dermatitis and Other (See Comments)    blisters   Morphine Nausea And Vomiting   Other Itching and Other (See Comments)    Chlorhexin (CHG); "burning"   Prednisone Other (See Comments)    "shakes me out of my frame" IM steroids aren't as bad as PO   Sulfonamide Derivatives Itching and Rash    All  over body    Varenicline Tartrate Nausea And Vomiting        Aspirin Nausea Only and Other (See Comments)    Burning in stomach    Atorvastatin Other (See Comments)    System will not process   PAST MEDICAL HISTORY Past Medical History:  Diagnosis Date   Arthritis    "little bit qwhere" (01/24/2012)   Asthma    Chronic back pain    "neck to tailbone" (01/24/2012)   COPD (chronic obstructive pulmonary disease) ([place])    Depression    takes Zoloft daily   Fibromyalgia    History of bladder infections    sees a urologist--on long term Macrodantin nightly   History of blood transfusion    "when I was a child" (01/24/2012)   Hyperlipidemia    takes Simvastatin daily   Hypertension    takes Maxzide daily   Hypothyroidism    takes Synthroid daily   Insomnia    takes Ambien nightly   Lupus ([place])    Neck pain    herniated disc and radiculopathy   Peripheral neuropathy    Pneumonia 03/05/2010   "have had it a couple times" (01/24/2012)   Type II diabetes mellitus ([place])    takes Janumet daily   Past Surgical History:  Procedure Laterality Date   ANTERIOR CERVICAL DECOMP/DISCECTOMY FUSION  2002; 01/24/2012   ANTERIOR CERVICAL DECOMP/DISCECTOMY FUSION  01/24/2012   Procedure: ANTERIOR CERVICAL DECOMPRESSION/DISCECTOMY FUSION 1 LEVEL;  Surgeon: Ophelia Charter, MD;  Location: [facility];  Service: Neurosurgery;  Laterality: N/A;  CERVICAL THREE-FOUR anterior cervical decompression with fusion interbody prothesis plating and bonegraft   APPENDECTOMY  11/04/1958   BACK SURGERY  03/06/1999   BLADDER SURGERY  03/06/1975   through abdomen   BLADDER SUSPENSION  03/05/1972   BUNIONECTOMY  11/04/1978   left   CARDIAC CATHETERIZATION  03/05/2006   CATARACT EXTRACTION     COLONOSCOPY     CORONARY STENT INTERVENTION N/A 09/01/2021   Procedure: CORONARY STENT INTERVENTION;  Surgeon: Burnell Blanks, MD;  Location: [facility];  Service: Cardiovascular;  Laterality:  N/A;   ESOPHAGOGASTRODUODENOSCOPY     LEFT HEART CATH AND CORONARY ANGIOGRAPHY N/A 09/01/2021   Procedure: LEFT HEART CATH AND CORONARY ANGIOGRAPHY;  Surgeon: Burnell Blanks, MD;  Location: [facility];  Service: Cardiovascular;  Laterality: N/A;   LUMBAR FUSION  03/06/2007   MOUTH SURGERY     growth removed inside mouth per patient   RECTOCELE REPAIR  03/05/1998   VAGINAL HYSTERECTOMY  03/05/1972   FAMILY HISTORY Family History  Problem Relation Age of Onset   Heart disease Mother 31       Congestive HF, CABG   Arrhythmia Father    Diabetes Sister    Heart disease Sister    Blindness Brother  Heart attack Brother    Diabetes Brother    SOCIAL HISTORY Social History   Tobacco Use   Smoking status: Every Day    Packs/day: 1.00    Years: 60.00    Total pack years: 60.00    Types: Cigarettes    Passive exposure: Never   Smokeless tobacco: Never  Vaping Use   Vaping Use: Former  Substance Use Topics   Alcohol use: No   Drug use: No       OPHTHALMIC EXAM: Not recorded     IMAGING AND PROCEDURES  Imaging and Procedures for 01/10/2022           ASSESSMENT/PLAN:  No diagnosis found.  1. PVD OD  - subacute symptomatic floaters, onset Sunday night 8.13.23 - Discussed findings and prognosis  - No RT or RD on 360 scleral depressed exam - Reviewed s/s of RT/RD - Strict return precautions for any such RT/RD signs/symptoms - f/u in November as scheduled -- DFE/OCT  2. Diabetes mellitus, type 2 without retinopathy  - exam with no MA or heme OU - The incidence, risk factors for progression, natural history and treatment options for diabetic retinopathy  were discussed with patient.   - The need for close monitoring of blood glucose, blood pressure, and serum lipids, avoiding cigarette or any type of tobacco, and the need for long term follow up was also discussed with patient. - f/u 6-9 months, sooner PRN  3. Age related macular degeneration,  non-exudative, both eyes  - intermediate stage with near confluent drusen and early atrophy -- stable  - no exudative disease on exam or OCT  - discussed ARMD as the most likely cause of visual symptoms  - The incidence, anatomy, and pathology of dry AMD, risk of progression, and the AREDS and AREDS 2 study including smoking risks discussed with patient.  - Recommend amsler grid monitoring   - f/u in 6-9 months  4,5. Hypertensive retinopathy OU - discussed importance of tight BP control - monitor  6. Pseudophakia OU  - s/p CE/IOL OU (R. [place])  - IOLs in good position, doing well  - monitor   Ophthalmic Meds Ordered this visit:  No orders of the defined types were placed in this encounter.     No follow-ups on file.  There are no Patient Instructions on file for this visit.   Explained the diagnoses, plan, and follow up with the patient and they expressed understanding.  Patient expressed understanding of the importance of proper follow up care.   This document serves as a record of services personally performed by Gardiner Sleeper, MD, PhD. It was created on their behalf by Orvan Falconer, an ophthalmic technician. The creation of this record is the provider's dictation and/or activities during the visit.    Electronically signed by: Orvan Falconer, OA, 01/09/22  10:41 AM   Gardiner Sleeper, M.D., Ph.D. Diseases & Surgery of the Retina and Vitreous [facility]  I have reviewed the above documentation for accuracy and completeness, and I agree with the above. Gardiner Sleeper, M.D., Ph.D. 10/20/21 10:05 AM   Abbreviations: M myopia (nearsighted); A astigmatism; H hyperopia (farsighted); P presbyopia; Mrx spectacle prescription;  CTL contact lenses; OD right eye; OS left eye; OU both eyes  XT exotropia; ET esotropia; PEK punctate epithelial keratitis; PEE punctate epithelial erosions; DES dry eye syndrome; MGD meibomian gland dysfunction; ATs  artificial tears; PFAT's preservative free artificial tears; [place] nuclear sclerotic cataract; [place]  posterior subcapsular cataract; ERM epi-retinal membrane; PVD posterior vitreous detachment; RD retinal detachment; DM diabetes mellitus; DR diabetic retinopathy; NPDR non-proliferative diabetic retinopathy; PDR proliferative diabetic retinopathy; CSME clinically significant macular edema; DME diabetic macular edema; dbh dot blot hemorrhages; CWS cotton wool spot; POAG primary open angle glaucoma; C/D cup-to-disc ratio; HVF humphrey visual field; GVF goldmann visual field; OCT optical coherence tomography; IOP intraocular pressure; BRVO Branch retinal vein occlusion; CRVO central retinal vein occlusion; CRAO central retinal artery occlusion; BRAO branch retinal artery occlusion; RT retinal tear; SB scleral buckle; PPV pars plana vitrectomy; VH Vitreous hemorrhage; PRP panretinal laser photocoagulation; IVK intravitreal kenalog; VMT vitreomacular traction; MH Macular hole;  NVD neovascularization of the disc; NVE neovascularization elsewhere; AREDS age related eye disease study; ARMD age related macular degeneration; POAG primary open angle glaucoma; EBMD epithelial/anterior basement membrane dystrophy; ACIOL anterior chamber intraocular lens; IOL intraocular lens; PCIOL posterior chamber intraocular lens; Phaco/IOL phacoemulsification with intraocular lens placement; Blue photorefractive keratectomy; LASIK laser assisted in situ keratomileusis; HTN hypertension; DM diabetes mellitus; COPD chronic obstructive pulmonary disease

## 2022-01-10 ENCOUNTER — Ambulatory Visit: Payer: Medicare Other | Admitting: Rheumatology

## 2022-01-10 ENCOUNTER — Encounter (INDEPENDENT_AMBULATORY_CARE_PROVIDER_SITE_OTHER): Payer: Medicare Other | Admitting: Ophthalmology

## 2022-01-10 DIAGNOSIS — M659 Synovitis and tenosynovitis, unspecified: Secondary | ICD-10-CM

## 2022-01-10 DIAGNOSIS — G629 Polyneuropathy, unspecified: Secondary | ICD-10-CM

## 2022-01-10 DIAGNOSIS — Z8739 Personal history of other diseases of the musculoskeletal system and connective tissue: Secondary | ICD-10-CM

## 2022-01-10 DIAGNOSIS — M797 Fibromyalgia: Secondary | ICD-10-CM

## 2022-01-10 DIAGNOSIS — Z79899 Other long term (current) drug therapy: Secondary | ICD-10-CM

## 2022-01-10 DIAGNOSIS — F17200 Nicotine dependence, unspecified, uncomplicated: Secondary | ICD-10-CM

## 2022-01-10 DIAGNOSIS — I10 Essential (primary) hypertension: Secondary | ICD-10-CM

## 2022-01-10 DIAGNOSIS — Z8709 Personal history of other diseases of the respiratory system: Secondary | ICD-10-CM

## 2022-01-10 DIAGNOSIS — Z8659 Personal history of other mental and behavioral disorders: Secondary | ICD-10-CM

## 2022-01-10 DIAGNOSIS — M0600 Rheumatoid arthritis without rheumatoid factor, unspecified site: Secondary | ICD-10-CM

## 2022-01-10 DIAGNOSIS — Z8639 Personal history of other endocrine, nutritional and metabolic disease: Secondary | ICD-10-CM

## 2022-01-10 DIAGNOSIS — E538 Deficiency of other specified B group vitamins: Secondary | ICD-10-CM

## 2022-01-10 DIAGNOSIS — M5030 Other cervical disc degeneration, unspecified cervical region: Secondary | ICD-10-CM

## 2022-01-10 DIAGNOSIS — L930 Discoid lupus erythematosus: Secondary | ICD-10-CM

## 2022-01-10 DIAGNOSIS — M67449 Ganglion, unspecified hand: Secondary | ICD-10-CM

## 2022-01-10 DIAGNOSIS — M5136 Other intervertebral disc degeneration, lumbar region: Secondary | ICD-10-CM

## 2022-01-17 NOTE — Progress Notes (Signed)
[facility] Note  01/30/2022     CHIEF COMPLAINT Patient presents for Retina Evaluation   HISTORY OF PRESENT ILLNESS: [name] is a 76 y.o. female who presents to the clinic today for:   HPI     Retina Evaluation   In right eye.  This started 5 days ago.  Duration of 5 days.  Associated Symptoms Floaters and Pain.  Negative for Flashes, Distortion, Blind Spot, Redness, Photophobia, Glare, Trauma, Scalp Tenderness, Jaw Claudication, Shoulder/Hip pain, Fever, Weight Loss and Fatigue.  Context:  distance vision, mid-range vision and near vision.  Treatments tried include no treatments.  I, the attending physician,  performed the HPI with the patient and updated documentation appropriately.        Comments   6-9 month retina evaluation and DM exam pt states vision is more blurred OD >OS she is reporting more floaters in the right eye but denies any flashes of light pt is reporting good glycemic control with the last one 97 last night A1C unknown        Last edited by Bernarda Caffey, MD on 01/31/2022  8:21 PM.    Pt feels like vision is down today, she states it is hard to drive at night bc of glare from other cars, she states her floaters are "driving me crazy"   Referring physician: Willey Blade, MD [place],  [place] [address]  HISTORICAL INFORMATION:   Selected notes from the [facility] referral for DEE -- pt of Dr. Katy Fitch [name]:  Ocular Hx- PMH-    CURRENT MEDICATIONS: No current outpatient medications on file. (Ophthalmic Drugs)   No current facility-administered medications for this visit. (Ophthalmic Drugs)   Current Outpatient Medications (Other)  Medication Sig   albuterol (VENTOLIN HFA) 108 (90 Base) MCG/ACT inhaler Inhale 2 puffs into the lungs every 8 (eight) hours as needed (asthma / chest congestion).   ALPRAZolam (XANAX) 0.25 MG tablet Take 0.25 mg by mouth 2 (two) times  daily as needed for anxiety.   BREZTRI AEROSPHERE 160-9-4.8 MCG/ACT AERO Inhale 2 puffs into the lungs 2 (two) times daily.   clopidogrel (PLAVIX) 75 MG tablet Take 1 tablet (75 mg total) by mouth daily.   Cyanocobalamin (B-12 COMPLIANCE INJECTION) 1000 MCG/ML KIT Vitamin B12 1022mg IM injection daily for 7 days, then weekly for 4 weeks, then monthly thereafter for 1 year. (Patient taking differently: Inject 1,000 mcg as directed every 30 (thirty) days.)   Cyanocobalamin (VITAMIN B-12) 5000 MCG SUBL Place 5,000 mcg under the tongue daily.   donepezil (ARICEPT) 5 MG tablet Take 5 mg by mouth at bedtime.   Etanercept (ENBREL MINI) 50 MG/ML SOCT Inject 50 mg into the skin once a week.   insulin glargine, 1 Unit Dial, (TOUJEO SOLOSTAR) 300 UNIT/ML Solostar Pen Inject 0-50 Units into the skin See admin instructions. Sliding scale  Under 120=0 Over 120= 10-20 units in the morning Bedtime 20 units if needed, can take up to 50 units depending in Blood glucose  35 units in the morning and 50 units at bedtime.   isosorbide mononitrate (IMDUR) 60 MG 24 hr tablet Take 1 tablet (60 mg total) by mouth daily.   levothyroxine (SYNTHROID, LEVOTHROID) 50 MCG tablet Take 50 mcg by mouth daily before breakfast.   metFORMIN (GLUCOPHAGE) 1000 MG tablet Take 1 tablet (1,000 mg total) by mouth daily with breakfast.   nitroGLYCERIN (NITROSTAT) 0.4 MG SL tablet Place 1 tablet (  0.4 mg total) under the tongue every 5 (five) minutes x 3 doses as needed for chest pain. If no relief after 3rd dose, proceed to the ED for an evaluation   ONETOUCH VERIO test strip 1 each 4 (four) times daily.   sertraline (ZOLOFT) 100 MG tablet Take 100 mg by mouth daily.    tiZANidine (ZANAFLEX) 2 MG tablet Start 1 tablet at bedtime x 3 days, then increase to 1 tablet twice daily (Patient not taking: Reported on 10/11/2021)   triamterene-hydrochlorothiazide (MAXZIDE-25) 37.5-25 MG tablet Take 1 tablet by mouth daily.   VITAMIN D PO Take 5,000  Units by mouth daily.   zolpidem (AMBIEN) 5 MG tablet Take 5 mg by mouth at bedtime. For sleep   No current facility-administered medications for this visit. (Other)   REVIEW OF SYSTEMS: ROS   Positive for: Eyes Negative for: Constitutional, Gastrointestinal, Neurological, Skin, Genitourinary, Musculoskeletal, HENT, Endocrine, Cardiovascular, Respiratory, Psychiatric, Allergic/Imm, Heme/Lymph Last edited by Bernarda Caffey, MD on 01/31/2022  8:20 PM.     ALLERGIES Allergies  Allergen Reactions   Erythromycin Anaphylaxis   Penicillins Anaphylaxis   Gadolinium Derivatives Hives and Rash    Pt broke out in hives all over body.  Has IV steroids and Benadryl in hospital for MRI.   Iodinated Contrast Media Hives    13-hour prep Pt had slight itching of her abd after her injection and had taken her 13 hr prep.  Consult with radiologist before another procedure to see if IV contrast is necessary, per Dr Almyra Free on 01/06/14, JB/   Latex Dermatitis and Other (See Comments)    blisters   Morphine Nausea And Vomiting   Other Itching and Other (See Comments)    Chlorhexin (CHG); "burning"   Prednisone Other (See Comments)    "shakes me out of my frame" IM steroids aren't as bad as PO   Sulfonamide Derivatives Itching and Rash    All over body    Varenicline Tartrate Nausea And Vomiting        Aspirin Nausea Only and Other (See Comments)    Burning in stomach    Atorvastatin Other (See Comments)    System will not process   PAST MEDICAL HISTORY Past Medical History:  Diagnosis Date   Arthritis    "little bit qwhere" (01/24/2012)   Asthma    Chronic back pain    "neck to tailbone" (01/24/2012)   COPD (chronic obstructive pulmonary disease) ([place])    Depression    takes Zoloft daily   Fibromyalgia    History of bladder infections    sees a urologist--on long term Macrodantin nightly   History of blood transfusion    "when I was a child" (01/24/2012)   Hyperlipidemia    takes  Simvastatin daily   Hypertension    takes Maxzide daily   Hypothyroidism    takes Synthroid daily   Insomnia    takes Ambien nightly   Lupus ([place])    Neck pain    herniated disc and radiculopathy   Peripheral neuropathy    Pneumonia 03/05/2010   "have had it a couple times" (01/24/2012)   Type II diabetes mellitus ([place])    takes Janumet daily   Past Surgical History:  Procedure Laterality Date   ANTERIOR CERVICAL DECOMP/DISCECTOMY FUSION  2002; 01/24/2012   ANTERIOR CERVICAL DECOMP/DISCECTOMY FUSION  01/24/2012   Procedure: ANTERIOR CERVICAL DECOMPRESSION/DISCECTOMY FUSION 1 LEVEL;  Surgeon: Ophelia Charter, MD;  Location: [facility];  Service: Neurosurgery;  Laterality:  N/A;  CERVICAL THREE-FOUR anterior cervical decompression with fusion interbody prothesis plating and bonegraft   APPENDECTOMY  11/04/1958   BACK SURGERY  03/06/1999   BLADDER SURGERY  03/06/1975   through abdomen   BLADDER SUSPENSION  03/05/1972   BUNIONECTOMY  11/04/1978   left   CARDIAC CATHETERIZATION  03/05/2006   CATARACT EXTRACTION     COLONOSCOPY     CORONARY STENT INTERVENTION N/A 09/01/2021   Procedure: CORONARY STENT INTERVENTION;  Surgeon: Burnell Blanks, MD;  Location: [facility];  Service: Cardiovascular;  Laterality: N/A;   ESOPHAGOGASTRODUODENOSCOPY     LEFT HEART CATH AND CORONARY ANGIOGRAPHY N/A 09/01/2021   Procedure: LEFT HEART CATH AND CORONARY ANGIOGRAPHY;  Surgeon: Burnell Blanks, MD;  Location: [facility];  Service: Cardiovascular;  Laterality: N/A;   LUMBAR FUSION  03/06/2007   MOUTH SURGERY     growth removed inside mouth per patient   RECTOCELE REPAIR  03/05/1998   VAGINAL HYSTERECTOMY  03/05/1972   FAMILY HISTORY Family History  Problem Relation Age of Onset   Heart disease Mother 46       Congestive HF, CABG   Arrhythmia Father    Diabetes Sister    Heart disease Sister    Blindness Brother    Heart attack Brother    Diabetes Brother     SOCIAL HISTORY Social History   Tobacco Use   Smoking status: Every Day    Packs/day: 1.00    Years: 60.00    Total pack years: 60.00    Types: Cigarettes    Passive exposure: Never   Smokeless tobacco: Never  Vaping Use   Vaping Use: Former  Substance Use Topics   Alcohol use: No   Drug use: No       OPHTHALMIC EXAM: Base Eye Exam     Visual Acuity (Snellen - Linear)       Right Left   Dist [place] 20/30 -1 20/40   Dist ph cc NI NI         Tonometry (Tonopen, 1:14 PM)       Right Left   Pressure 9 11         Pupils       Pupils Dark Light Shape React APD   Right PERRL 2 1 Round Brisk None   Left PERRL 2 1 Round Brisk None         Visual Fields       Left Right    Full Full         Extraocular Movement       Right Left    Full, Ortho Full, Ortho         Neuro/Psych     Oriented x3: Yes   Mood/Affect: Normal         Dilation     Both eyes: 2.5% Phenylephrine @ 1:14 PM           Slit Lamp and Fundus Exam     Slit Lamp Exam       Right Left   Lids/Lashes Dermatochalasis - upper lid Dermatochalasis - upper lid   Conjunctiva/Sclera White and quiet White and quiet   Cornea arcus, 2+PEE, well healed cataract wound arcus, 2-3+PEE, well healed cataract wound   Anterior Chamber deep and clear, no cell or flare deep and clear, no cell or flare   Iris Round and moderately dilated Round and moderately dilated   Lens PC IOL in good position, 1+ Posterior  capsular opacification PC IOL in good position, trace Posterior capsular opacification   Anterior Vitreous Vitreous syneresis, no cell or pigment, Posterior vitreous detachment, Weiss ring Vitreous syneresis, no cell or pigment         Fundus Exam       Right Left   Disc mild Pallor, Sharp rim mild Pallor, Sharp rim, +SVP   C/D Ratio 0.3 0.4   Macula Flat, Good foveal reflex, Drusen, mild RPE mottling, No heme or edema Flat, Good foveal reflex, Drusen, RPE mottling and clumping,  central vitelliform like lesion, No heme or edema   Vessels attenuated, Tortuous attenuated, Tortuous   Periphery Attached, rare peripheral DBH, No RT/RD on 360 scleral depression Attached, No heme           Refraction     Manifest Refraction       Sphere Cylinder Dist VA   Right      Left -0.50 Sphere 20/25-1            IMAGING AND PROCEDURES  Imaging and Procedures for 01/30/2022  OCT, Retina - OU - Both Eyes       Right Eye Quality was good. Central Foveal Thickness: 289. Progression has been stable. Findings include normal foveal contour, no IRF, no SRF, retinal drusen (stable release of VMA to full PVD, tr vit opacities -- improved).   Left Eye Quality was good. Central Foveal Thickness: 285. Progression has been stable. Findings include normal foveal contour, no IRF, no SRF, retinal drusen , subretinal hyper-reflective material (Mild central SRHM / vitelliform like lesion, no DME; persistent VMA).   Notes *Images captured and stored on drive  Diagnosis / Impression:  NFP; no IRF/SRF OU No DME OU OD: stable release of VMA to full PVD, tr vit opacities -- improved OS: Mild central SRHM / vitelliform like lesion, no DME   Clinical management:  See below  Abbreviations: NFP - Normal foveal profile. CME - cystoid macular edema. PED - pigment epithelial detachment. IRF - intraretinal fluid. SRF - subretinal fluid. EZ - ellipsoid zone. ERM - epiretinal membrane. ORA - outer retinal atrophy. ORT - outer retinal tubulation. SRHM - subretinal hyper-reflective material. IRHM - intraretinal hyper-reflective material              ASSESSMENT/PLAN:    ICD-10-CM   1. Diabetes mellitus type 2 without retinopathy ([place])  E11.9 OCT, Retina - OU - Both Eyes    2. Intermediate stage nonexudative age-related macular degeneration of both eyes  H35.3132     3. Essential hypertension  I10     4. Posterior vitreous detachment of right eye  H43.811     5. Hypertensive  retinopathy of both eyes  H35.033     6. Pseudophakia of both eyes  Z96.1     7. Dry eyes  H04.123      **Pt presents acutely for decreased vision and increased floaters** - exam stable except for mild increase in PEE / dry eyes OU - BCVA decreased OU to 20/30 OD, 20/40 OS from 20/20 OU - recommend artificial tears and lubricating ointment as needed   1. Diabetes mellitus, type 2 without retinopathy  - exam with no MA or heme OU - The incidence, risk factors for progression, natural history and treatment options for diabetic retinopathy  were discussed with patient.   - The need for close monitoring of blood glucose, blood pressure, and serum lipids, avoiding cigarette or any type of tobacco, and the need for long term  follow up was also discussed with patient. - f/u 6-9 months, sooner PRN  2. Age related macular degeneration, non-exudative, both eyes  - intermediate stage with near confluent drusen and early atrophy -- stable  - no exudative disease on exam or OCT  - discussed ARMD as the most likely cause of visual symptoms  - The incidence, anatomy, and pathology of dry AMD, risk of progression, and the AREDS and AREDS 2 study including smoking risks discussed with patient.  - Recommend amsler grid monitoring  - f/u in 6-9 mos  3. PVD OD  - subacute symptomatic floaters, onset Sunday night 8.13.23  - subjective increase in floaters -- exam stable - Discussed findings and prognosis - No RT or RD on 360 scleral depressed exam - Reviewed s/s of RT/RD - Strict return precautions for any such RT/RD signs/symptoms - monitor  4,5. Hypertensive retinopathy OU - discussed importance of tight BP control - monitor  6. Pseudophakia OU  - s/p CE/IOL OU (R. [place])  - IOLs in good position, doing well  - monitor   Ophthalmic Meds Ordered this visit:  No orders of the defined types were placed in this encounter.     Return for f/u 6-9 months, DM exam, DFE, OCT.  There are no  Patient Instructions on file for this visit.   Explained the diagnoses, plan, and follow up with the patient and they expressed understanding.  Patient expressed understanding of the importance of proper follow up care.   This document serves as a record of services personally performed by Gardiner Sleeper, MD, PhD. It was created on their behalf by Renaldo Reel, [place] an ophthalmic technician. The creation of this record is the provider's dictation and/or activities during the visit.    Electronically signed by:  Renaldo Reel, COT  11.15.23 8:25 PM  This document serves as a record of services personally performed by Gardiner Sleeper, MD, PhD. It was created on their behalf by San Jetty. Owens Shark, OA an ophthalmic technician. The creation of this record is the provider's dictation and/or activities during the visit.    Electronically signed by: San Jetty. Owens Shark, [place] 11.28.2023 8:25 PM  Gardiner Sleeper, M.D., Ph.D. Diseases & Surgery of the Retina and Vitreous [facility]  I have reviewed the above documentation for accuracy and completeness, and I agree with the above. Gardiner Sleeper, M.D., Ph.D. 01/31/22 8:25 PM  Abbreviations: M myopia (nearsighted); A astigmatism; H hyperopia (farsighted); P presbyopia; Mrx spectacle prescription;  CTL contact lenses; OD right eye; OS left eye; OU both eyes  XT exotropia; ET esotropia; PEK punctate epithelial keratitis; PEE punctate epithelial erosions; DES dry eye syndrome; MGD meibomian gland dysfunction; ATs artificial tears; PFAT's preservative free artificial tears; [place] nuclear sclerotic cataract; PSC posterior subcapsular cataract; ERM epi-retinal membrane; PVD posterior vitreous detachment; RD retinal detachment; DM diabetes mellitus; DR diabetic retinopathy; NPDR non-proliferative diabetic retinopathy; PDR proliferative diabetic retinopathy; CSME clinically significant macular edema; DME diabetic macular edema; dbh dot blot  hemorrhages; CWS cotton wool spot; POAG primary open angle glaucoma; C/D cup-to-disc ratio; HVF humphrey visual field; GVF goldmann visual field; OCT optical coherence tomography; IOP intraocular pressure; BRVO Branch retinal vein occlusion; CRVO central retinal vein occlusion; CRAO central retinal artery occlusion; BRAO branch retinal artery occlusion; RT retinal tear; SB scleral buckle; PPV pars plana vitrectomy; VH Vitreous hemorrhage; PRP panretinal laser photocoagulation; IVK intravitreal kenalog; VMT vitreomacular traction; MH Macular hole;  NVD neovascularization of the disc; NVE  neovascularization elsewhere; AREDS age related eye disease study; ARMD age related macular degeneration; POAG primary open angle glaucoma; EBMD epithelial/anterior basement membrane dystrophy; ACIOL anterior chamber intraocular lens; IOL intraocular lens; PCIOL posterior chamber intraocular lens; Phaco/IOL phacoemulsification with intraocular lens placement; [place] photorefractive keratectomy; LASIK laser assisted in situ keratomileusis; HTN hypertension; DM diabetes mellitus; COPD chronic obstructive pulmonary disease

## 2022-01-29 ENCOUNTER — Ambulatory Visit: Payer: Medicare Other | Admitting: Neurology

## 2022-01-30 ENCOUNTER — Ambulatory Visit (INDEPENDENT_AMBULATORY_CARE_PROVIDER_SITE_OTHER): Payer: Medicare Other | Admitting: Ophthalmology

## 2022-01-30 DIAGNOSIS — H43811 Vitreous degeneration, right eye: Secondary | ICD-10-CM

## 2022-01-30 DIAGNOSIS — H353132 Nonexudative age-related macular degeneration, bilateral, intermediate dry stage: Secondary | ICD-10-CM

## 2022-01-30 DIAGNOSIS — H04123 Dry eye syndrome of bilateral lacrimal glands: Secondary | ICD-10-CM

## 2022-01-30 DIAGNOSIS — H35033 Hypertensive retinopathy, bilateral: Secondary | ICD-10-CM | POA: Diagnosis not present

## 2022-01-30 DIAGNOSIS — E119 Type 2 diabetes mellitus without complications: Secondary | ICD-10-CM

## 2022-01-30 DIAGNOSIS — Z961 Presence of intraocular lens: Secondary | ICD-10-CM

## 2022-01-30 DIAGNOSIS — I10 Essential (primary) hypertension: Secondary | ICD-10-CM | POA: Diagnosis not present

## 2022-01-31 ENCOUNTER — Encounter (INDEPENDENT_AMBULATORY_CARE_PROVIDER_SITE_OTHER): Payer: Self-pay | Admitting: Ophthalmology

## 2022-01-31 ENCOUNTER — Telehealth: Payer: Self-pay

## 2022-01-31 NOTE — Telephone Encounter (Signed)
Received completed patient portion of ENBREL PAP renewal. Provider portion placed in Dr. Deveshwar's folder for signature.   [name] [employer] PharmD Candidate 2024  

## 2022-02-07 NOTE — Telephone Encounter (Signed)
Signed provider portion received. PAP submission is pending updated PA for 2024  Submitted a Prior Authorization RENEWAL request to [facility] for ENBREL via CoverMyMeds. Will update once we receive a response.  Key: [id number]  Chesley Mires, PharmD, MPH, BCPS, CPP Clinical Pharmacist (Rheumatology and Pulmonology)

## 2022-02-07 NOTE — Telephone Encounter (Signed)
Received notification from [facility] regarding a prior authorization for ENBREL. Authorization has been APPROVED from 02/07/22 to 03/05/2023. Approval letter sent to scan center.  Authorization # [id number]  Submitted Patient Assistance RENEWAL Application to [employer] for ENBREL along with provider portion, patient portion, med list, insurance card copy, and PA. Will update patient when we receive a response.  Fax# [phone] Phone# [phone]  Chesley Mires, PharmD, MPH, BCPS, CPP Clinical Pharmacist (Rheumatology and Pulmonology)

## 2022-02-16 ENCOUNTER — Telehealth: Payer: Self-pay

## 2022-02-16 NOTE — Telephone Encounter (Signed)
   Pre-operative Risk Assessment    Patient Name: [name]  DOB: 08-Apr-1945 MRN: [id number]     Request for Surgical Clearance    Procedure:  Colonoscopy   Date of Surgery:  Clearance TBD                                 Surgeon:  Dr. Phillips Climes Group or Practice Name:  [facility]  Phone number:  [phone] Fax number:  [phone]   Type of Clearance Requested:   - Medical    Type of Anesthesia:  Propofol    Additional requests/questions:    Scarlette Shorts   02/16/2022, 4:23 PM

## 2022-02-19 ENCOUNTER — Other Ambulatory Visit (HOSPITAL_COMMUNITY): Payer: Self-pay | Admitting: Internal Medicine

## 2022-02-19 DIAGNOSIS — E2839 Other primary ovarian failure: Secondary | ICD-10-CM

## 2022-02-19 NOTE — Telephone Encounter (Signed)
Hi Dr. Antoine Poche! Patient has an upcoming coloscopy planned and will need to hold Plavix. She has a history of CAD. LHC on 09/01/2021 showed 90% stenosis of OM2 which was successfully treated with PTCA and DES. Otherwise, she only had moderate non-obstructive disease of LAD and RCA. She was last seen by you in 10/2021 at which time she reported occasional chest discomfort associated with exertion and Imdur was increased. It was also felt that some of her chest discomfort may not be cardiac in nature and she was advised to follow-up with GI. She is just shy of being 6 months out from her PCI. She is only on Plavix monotherapy as she is unable to tolerate Aspirin.   Is it okay if patient holds Plavix for 5 days prior to procedure? Please route response back to P CV DIV PREOP.  Thank you! Johnte Portnoy

## 2022-02-21 NOTE — Telephone Encounter (Signed)
   Patient Name: [name]  DOB: 12-11-1945 MRN: [id number]  Primary Cardiologist: Rollene Rotunda, MD  Chart reviewed as part of pre-operative protocol coverage. Patient has an upcoming colonoscopy planned for further evaluation of chronic diarrhea. She was last seen by Dr. Antoine Poche in 10/2021 at which time she reported occasional chest discomfort associated with exertion and Imdur was increased. It was also felt that some of her chest discomfort may not be cardiac in nature and she was advised to follow-up with GI. I called and spoke with patient today for further pre-op evaluation. She reports no real chest pain since addition of Imdur. She has some chronic shortness of breath when she over exerts herself which she attributes to her long smoking history but this is not new and is stable. No shortness of breath at rest or with routine activities around the house. No orthopnea/PND, palpitations, or syncope. She is able to complete >4.0 METs without any anginal symptoms. Given past medical history and time since last visit, based on ACC/AHA guidelines, [name] is at acceptable risk for the planned procedure without further cardiovascular testing.   In regards to holding Plavix: She had a cardiac stent placed on 09/01/2021. We ideally like to wait a full year for any elective procedures before temporarily holding Plavix. However, discussed with Dr. Antoine Poche who felt like procedure did not need to be delayed given patient's symptoms. Therefore, OK to hold Plavix for 5 days prior to procedure. If this can wait until January 2024, that would be great since this would get to the 6 month mark after her PCI. However, if procedure cannot wait, OK to do before. Please restart her Plavix as soon as safely possible afterwards.   I will route this recommendation to the requesting party via Epic fax function and remove from pre-op pool.  Please call with questions.  Corrin Parker,  PA-C 02/21/2022, 8:35 AM

## 2022-03-02 NOTE — Telephone Encounter (Signed)
Called [employer] for update on patient's Enbrel renewal application.  Received a fax from  [employer] regarding an approval for ENBREL patient assistance from 03/05/2022 to 03/05/2023.  Approval letter has been requested to be faxed again to our clinic  Phone# [phone]  Chesley Mires, PharmD, MPH, BCPS, CPP Clinical Pharmacist (Rheumatology and Pulmonology)

## 2022-03-07 ENCOUNTER — Other Ambulatory Visit (HOSPITAL_COMMUNITY): Payer: Medicare Other

## 2022-03-14 ENCOUNTER — Other Ambulatory Visit (HOSPITAL_COMMUNITY): Payer: Medicare Other

## 2022-03-14 ENCOUNTER — Encounter (HOSPITAL_COMMUNITY): Payer: Self-pay

## 2022-03-14 ENCOUNTER — Ambulatory Visit (HOSPITAL_COMMUNITY)
Admission: RE | Admit: 2022-03-14 | Discharge: 2022-03-14 | Disposition: A | Discharge status: Home / Self Care | Payer: Medicare Other | Source: Ambulatory Visit | Attending: Internal Medicine | Admitting: Internal Medicine

## 2022-03-14 DIAGNOSIS — E2839 Other primary ovarian failure: Secondary | ICD-10-CM | POA: Insufficient documentation

## 2022-03-16 ENCOUNTER — Other Ambulatory Visit: Payer: Self-pay | Admitting: *Deleted

## 2022-03-16 DIAGNOSIS — R0989 Other specified symptoms and signs involving the circulatory and respiratory systems: Secondary | ICD-10-CM

## 2022-04-02 ENCOUNTER — Ambulatory Visit: Payer: Medicare Other | Admitting: Surgery

## 2022-04-02 ENCOUNTER — Ambulatory Visit (HOSPITAL_COMMUNITY)
Admission: RE | Admit: 2022-04-02 | Discharge: 2022-04-02 | Disposition: A | Discharge status: Home / Self Care | Payer: Medicare Other | Source: Ambulatory Visit | Attending: Surgery | Admitting: Surgery

## 2022-04-02 ENCOUNTER — Encounter: Payer: Self-pay | Admitting: Surgery

## 2022-04-02 VITALS — BP 115/69 | HR 56 | Temp 97.9°F | Resp 20 | Ht 65.0 in | Wt 126.0 lb

## 2022-04-02 DIAGNOSIS — R0989 Other specified symptoms and signs involving the circulatory and respiratory systems: Secondary | ICD-10-CM | POA: Diagnosis not present

## 2022-04-02 DIAGNOSIS — I6523 Occlusion and stenosis of bilateral carotid arteries: Secondary | ICD-10-CM

## 2022-04-02 NOTE — Progress Notes (Signed)
[facility]   Patient name: [name] MRN: [id number] DOB: July 01, 1945 Sex: female   REASON FOR VISIT:    Follow up  HISOTRY OF PRESENT ILLNESS:    [name] is a 77 y.o. female who I initially met in February 2018.  She had approximately a 4 month history of neck pain that initially began on the right side.  It became unbearable.  She described it as an earache.  She was unable to bend over.  It got so bad that she had her husband take her to the emergency department.  She underwent a CT scan that showed a small mass.  She was referred to Dr. Jenne [name] (ENT).  This was felt to be inflammation and she was given 2 weeks of steroids.  She returned for repeat CT scan 2 weeks later and there was very little change.  She was placed on Toradol shots daily for 7 days.  On approximately the 11th day of the steroids, she developed shingles.   She denied any history of neck trauma she does have a history of 2 spinal fusions. These were done through the neck.   I referred the patient to rheumatology.  A biopsy had been requested but I was reluctant to proceed with biopsy due to the location of the lesion.  The patient was ultimately sent to [facility].  There, she underwent a PET scan as well as a full body CT scan which she tells me were unremarkable.  She was sent to a neuro ophthalmologist who ruled out Graves' disease.  She also had a temporal artery biopsy which was negative.  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   More recently she developed swelling in her right hand. Dr. Amanda [name] operated on her and noted severe inflammation. She was sent to see Dr. Corliss [name] who diagnosed her with rheumatoid and tried her on methotrexate and ultimately and more recently Enbrel. This appears to be helping.  She has been doing fairly well.  She did have a fall several months ago which she is recovering from  that.  Unfortunately her husband, [name], passed away approximately 2 months ago.  She has not had any carotid symptoms.   PAST MEDICAL HISTORY:   Past Medical History:  Diagnosis Date   Arthritis    "little bit qwhere" (01/24/2012)   Asthma    Chronic back pain    "neck to tailbone" (01/24/2012)   COPD (chronic obstructive pulmonary disease) (HCC)    Depression    takes Zoloft daily   Fibromyalgia    History of bladder infections    sees a urologist--on long term Macrodantin nightly   History of blood transfusion    "when I was a child" (01/24/2012)   Hyperlipidemia    takes Simvastatin daily   Hypertension    takes Maxzide daily   Hypothyroidism    takes Synthroid daily   Insomnia    takes Ambien nightly   Lupus (HCC)    Neck pain    herniated disc and radiculopathy   Peripheral neuropathy    Pneumonia 03/05/2010   "have had it a couple times" (01/24/2012)   Type II diabetes mellitus (HCC)    takes Janumet daily     FAMILY HISTORY:   Family History  Problem Relation Age of Onset   Heart disease Mother 61       Congestive HF, CABG   Arrhythmia Father  Diabetes Sister    Heart disease Sister    Blindness Brother    Heart attack Brother    Diabetes Brother     SOCIAL HISTORY:   Social History   Tobacco Use   Smoking status: Every Day    Packs/day: 1.00    Years: 60.00    Total pack years: 60.00    Types: Cigarettes    Passive exposure: Never   Smokeless tobacco: Never  Substance Use Topics   Alcohol use: No     ALLERGIES:   Allergies  Allergen Reactions   Erythromycin Anaphylaxis   Penicillins Anaphylaxis   Gadolinium Derivatives Hives and Rash    Pt broke out in hives all over body.  Has IV steroids and Benadryl in hospital for MRI.   Iodinated Contrast Media Hives    13-hour prep Pt had slight itching of her abd after her injection and had taken her 13 hr prep.  Consult with radiologist before another procedure to see if IV contrast  is necessary, per Dr Almyra Free on 01/06/14, JB/   Latex Dermatitis and Other (See Comments)    blisters   Morphine Nausea And Vomiting   Other Itching and Other (See Comments)    Chlorhexin (CHG); "burning"   Prednisone Other (See Comments)    "shakes me out of my frame" IM steroids aren't as bad as PO   Sulfonamide Derivatives Itching and Rash    All over body    Varenicline Tartrate Nausea And Vomiting        Aspirin Nausea Only and Other (See Comments)    Burning in stomach    Atorvastatin Other (See Comments)    System will not process     CURRENT MEDICATIONS:   Current Outpatient Medications  Medication Sig Dispense Refill   albuterol (VENTOLIN HFA) 108 (90 Base) MCG/ACT inhaler Inhale 2 puffs into the lungs every 8 (eight) hours as needed (asthma / chest congestion).     ALPRAZolam (XANAX) 0.25 MG tablet Take 0.25 mg by mouth 2 (two) times daily as needed for anxiety.     BREZTRI AEROSPHERE 160-9-4.8 MCG/ACT AERO Inhale 2 puffs into the lungs 2 (two) times daily.     clopidogrel (PLAVIX) 75 MG tablet Take 1 tablet (75 mg total) by mouth daily. 90 tablet 3   Cyanocobalamin (B-12 COMPLIANCE INJECTION) 1000 MCG/ML KIT Vitamin B12 1057mcg IM injection daily for 7 days, then weekly for 4 weeks, then monthly thereafter for 1 year. (Patient taking differently: Inject 1,000 mcg as directed every 30 (thirty) days.) 1 kit 0   donepezil (ARICEPT) 5 MG tablet Take 5 mg by mouth at bedtime.     Etanercept (ENBREL MINI) 50 MG/ML SOCT Inject 50 mg into the skin once a week. 12 mL 0   insulin glargine, 1 Unit Dial, (TOUJEO SOLOSTAR) 300 UNIT/ML Solostar Pen Inject 0-50 Units into the skin See admin instructions. Sliding scale  Under 120=0 Over 120= 10-20 units in the morning Bedtime 20 units if needed, can take up to 50 units depending in Blood glucose  35 units in the morning and 50 units at bedtime.     isosorbide mononitrate (IMDUR) 60 MG 24 hr tablet Take 1 tablet (60 mg total) by mouth  daily. 90 tablet 3   levothyroxine (SYNTHROID, LEVOTHROID) 50 MCG tablet Take 50 mcg by mouth daily before breakfast.     metFORMIN (GLUCOPHAGE) 1000 MG tablet Take 1 tablet (1,000 mg total) by mouth daily with breakfast.  nitroGLYCERIN (NITROSTAT) 0.4 MG SL tablet Place 1 tablet (0.4 mg total) under the tongue every 5 (five) minutes x 3 doses as needed for chest pain. If no relief after 3rd dose, proceed to the ED for an evaluation 25 tablet 3   ONETOUCH VERIO test strip 1 each 4 (four) times daily.     sertraline (ZOLOFT) 100 MG tablet Take 100 mg by mouth daily.      triamterene-hydrochlorothiazide (MAXZIDE-25) 37.5-25 MG tablet Take 1 tablet by mouth daily.     VITAMIN D PO Take 5,000 Units by mouth daily.     zolpidem (AMBIEN) 5 MG tablet Take 5 mg by mouth at bedtime. For sleep     No current facility-administered medications for this visit.    REVIEW OF SYSTEMS:   [X]  denotes positive finding, [ ]  denotes negative finding Cardiac  Comments:  Chest pain or chest pressure:    Shortness of breath upon exertion:    Short of breath when lying flat:    Irregular heart rhythm:        Vascular    Pain in calf, thigh, or hip brought on by ambulation:    Pain in feet at night that wakes you up from your sleep:     Blood clot in your veins:    Leg swelling:         Pulmonary    Oxygen at home:    Productive cough:     Wheezing:         Neurologic    Sudden weakness in arms or legs:     Sudden numbness in arms or legs:     Sudden onset of difficulty speaking or slurred speech:    Temporary loss of vision in one eye:     Problems with dizziness:         Gastrointestinal    Blood in stool:     Vomited blood:         Genitourinary    Burning when urinating:     Blood in urine:        Psychiatric    Major depression:         Hematologic    Bleeding problems:    Problems with blood clotting too easily:        Skin    Rashes or ulcers:        Constitutional    Fever  or chills:      PHYSICAL EXAM:   Vitals:   04/02/22 1039 04/02/22 1041  BP: 117/61 115/69  Pulse: (!) 56   Resp: 20   Temp: 97.9 F (36.6 C)   SpO2: 93%   Weight: 126 lb (57.2 kg)   Height: 5\' 5"  (1.651 m)     GENERAL: The patient is a well-nourished female, in no acute distress. The vital signs are documented above. CARDIAC: There is a regular rate and rhythm.  VASCULAR: Palpable dorsalis pedis pulses bilaterally PULMONARY: Non-labored respirations MUSCULOSKELETAL: There are no major deformities or cyanosis. NEUROLOGIC: No focal weakness or paresthesias are detected. SKIN: There are no ulcers or rashes noted. PSYCHIATRIC: The patient has a normal affect.  STUDIES:   I have reviewed the following carotid duplex: Right Carotid: Velocities in the right ICA are consistent with a 1-39%  stenosis.   Left Carotid: Velocities in the left ICA are consistent with a 1-39%  stenosis.   Vertebrals: Bilateral vertebral arteries demonstrate antegrade flow.  Subclavians: Normal flow hemodynamics were seen in bilateral subclavian  arteries.   MEDICAL ISSUES:   Carotid: No significant stenosis.  I will plan on repeating her study in 2 years    Annamarie Major, IV, MD, FACS [facility] [phone] Pager [phone]

## 2022-04-04 ENCOUNTER — Telehealth: Payer: Self-pay

## 2022-04-04 NOTE — Telephone Encounter (Signed)
Patient left a voicemail requesting a refill of enbrel. Patient is very overdue for labs and also in need of a follow up appointment.  Last labs: 09/23/2021  I called patient and left message to advise patient she is need of labs and a follow up appointment. Advised patient to call the office.

## 2022-04-05 NOTE — Progress Notes (Signed)
Office Visit Note  Patient: [name]             Date of Birth: 22-Mar-1945           MRN: [id number]             PCP: Willey Blade, MD Referring: Willey Blade, MD Visit Date: 04/06/2022 Occupation: @GUAROCC @  Subjective:  Medication management  History of Present Illness: [name] is a 77 y.o. female history of seronegative rheumatoid arthritis, discoid lupus and osteoarthritis.  She states she has been taking Enbrel 50 mg subcu weekly without much interruption.  She states she has missed only 1 dose of Enbrel.  She denies any joint pain or joint swelling.  She states that she has recently noticed rash on her face neck and her arms.  She has not seen the dermatologist for the rash.  She states she has been having diarrhea for the last 9 months.  She is scheduled to have a colonoscopy with Dr. Collene Mares.  She also had some chest symptoms for which she was evaluated by cardiologist and had a stent placement.    Activities of Daily Living:  Patient reports morning stiffness for 20 minute.   Patient Reports nocturnal pain.  Difficulty dressing/grooming: Reports Difficulty climbing stairs: Reports Difficulty getting out of chair: Reports Difficulty using hands for taps, buttons, cutlery, and/or writing: Reports  Review of Systems  Constitutional:  Positive for fatigue.  HENT:  Positive for mouth sores and mouth dryness.   Eyes:  Positive for dryness.  Respiratory:  Negative for difficulty breathing.   Cardiovascular:  Positive for palpitations.  Gastrointestinal:  Positive for diarrhea. Negative for blood in stool and constipation.  Endocrine: Negative.  Negative for increased urination.  Genitourinary:  Positive for involuntary urination.  Musculoskeletal:  Positive for joint pain, gait problem, joint pain, joint swelling, myalgias, morning stiffness and myalgias. Negative for muscle weakness and muscle tenderness.  Skin:  Positive for rash, hair loss and  sensitivity to sunlight.  Allergic/Immunologic: Positive for susceptible to infections.  Neurological:  Positive for dizziness and headaches.  Hematological: Negative.  Negative for swollen glands.  Psychiatric/Behavioral:  Positive for depressed mood and sleep disturbance. The patient is nervous/anxious.     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   Gastroesophageal reflux disease without esophagitis 11/27/2019   Generalized anxiety disorder 11/27/2019   Hypothyroidism 11/27/2019   Smoker 11/27/2019   Type 2 diabetes mellitus without complication ([place]) 29/51/8841   Varicose veins of lower extremity 11/27/2019   Vitamin D deficiency 11/27/2019   Arthritis of hand 11/05/2019   Inflammation of joint of both hands 11/05/2019   Encounter for orthopedic follow-up care 01/09/2019   Tenosynovitis of fingers 09/22/2018   Tenosynovitis of right wrist 09/22/2018   Pain in right hand 09/08/2018   Nonintractable headache 08/16/2016   Carotid artery tenderness 10/26/2015   Leg pain, left 07/27/2014   Back pain of thoracolumbar region 08/11/2012   Renal cyst 08/04/2012   Cervical spondylosis with radiculopathy 01/24/2012   Chronic interstitial cystitis 11/01/2011   Mixed incontinence 11/01/2011   EMPHYSEMA 08/05/2008   LEUKOCYTOSIS 06/25/2008   COPD 06/25/2008   GASTROPARESIS 06/25/2008   DM 06/14/2008   Hyperlipidemia  LDL goal <70 06/14/2008   Essential hypertension 06/14/2008   ASTHMATIC BRONCHITIS, ACUTE 06/14/2008   ALLERGIC RHINITIS 06/14/2008   LUPUS 06/14/2008    Past Medical History:  Diagnosis Date   Arthritis    "little bit qwhere" (01/24/2012)   Asthma    Chronic back pain    "neck  to tailbone" (01/24/2012)   COPD (chronic obstructive pulmonary disease) (HCC)    Depression    takes Zoloft daily   Fibromyalgia    History of bladder infections    sees a urologist--on long term Macrodantin nightly   History of blood transfusion    "when I was a child" (01/24/2012)   Hyperlipidemia    takes Simvastatin daily   Hypertension    takes Maxzide daily   Hypothyroidism    takes Synthroid daily   Insomnia    takes Ambien nightly   Lupus (HCC)    Neck pain    herniated disc and radiculopathy   Peripheral neuropathy    Pneumonia 03/05/2010   "have had it a couple times" (01/24/2012)   Type II diabetes mellitus (HCC)    takes Janumet daily    Family History  Problem Relation Age of Onset   Heart disease Mother 6460       Congestive HF, CABG   Arrhythmia Father    Diabetes Sister    Heart disease Sister    Blindness Brother    Heart attack Brother    Diabetes Brother    Past Surgical History:  Procedure Laterality Date   ANTERIOR CERVICAL DECOMP/DISCECTOMY FUSION  2002; 01/24/2012   ANTERIOR CERVICAL DECOMP/DISCECTOMY FUSION  01/24/2012   Procedure: ANTERIOR CERVICAL DECOMPRESSION/DISCECTOMY FUSION 1 LEVEL;  Surgeon: Cristi LoronJeffrey D Jenkins, MD;  Location: [facility];  Service: Neurosurgery;  Laterality: N/A;  CERVICAL THREE-FOUR anterior cervical decompression with fusion interbody prothesis plating and bonegraft   APPENDECTOMY  11/04/1958   BACK SURGERY  03/06/1999   BLADDER SURGERY  03/06/1975   through abdomen   BLADDER SUSPENSION  03/05/1972   BUNIONECTOMY  11/04/1978   left   CARDIAC CATHETERIZATION  03/05/2006   CATARACT EXTRACTION     COLONOSCOPY     CORONARY STENT INTERVENTION N/A 09/01/2021   Procedure: CORONARY STENT INTERVENTION;  Surgeon: Kathleene HazelMcAlhany, Christopher D, MD;  Location: [facility];  Service: Cardiovascular;  Laterality: N/A;   ESOPHAGOGASTRODUODENOSCOPY     LEFT HEART CATH AND CORONARY ANGIOGRAPHY N/A 09/01/2021   Procedure: LEFT  HEART CATH AND CORONARY ANGIOGRAPHY;  Surgeon: Kathleene HazelMcAlhany, Christopher D, MD;  Location: [facility];  Service: Cardiovascular;  Laterality: N/A;   LUMBAR FUSION  03/06/2007   MOUTH SURGERY     growth removed inside mouth per patient   RECTOCELE REPAIR  03/05/1998   VAGINAL HYSTERECTOMY  03/05/1972   Social History   Social History Narrative   Right Handed    Lives in a one story home    Immunization History  Administered Date(s) Administered   Influenza Split 01/25/2012   Influenza, High Dose Seasonal PF 01/01/2017   Influenza-Unspecified 12/03/2012   Pneumococcal Conjugate-13 12/17/2017   Pneumococcal Polysaccharide-23 01/25/2012   Tdap 04/14/2014     Objective: Vital Signs: BP 126/61 (BP Location: Left Arm, Patient Position: Sitting, Cuff Size: Normal)   Pulse 62   Resp 16   Ht 5\' 5"  (1.651 m)   Wt 125 lb 9.6 oz (57 kg)   BMI 20.90 kg/m    Physical Exam Vitals and nursing note reviewed.  Constitutional:  Appearance: She is well-developed.  HENT:     Head: Normocephalic and atraumatic.  Eyes:     Conjunctiva/sclera: Conjunctivae normal.  Cardiovascular:     Rate and Rhythm: Normal rate and regular rhythm.     Heart sounds: Normal heart sounds.  Pulmonary:     Effort: Pulmonary effort is normal.     Breath sounds: Normal breath sounds.  Abdominal:     General: Bowel sounds are normal.     Palpations: Abdomen is soft.  Musculoskeletal:     Cervical back: Normal range of motion.  Lymphadenopathy:     Cervical: No cervical adenopathy.  Skin:    General: Skin is warm and dry.     Capillary Refill: Capillary refill takes less than 2 seconds.  Neurological:     Mental Status: She is alert and oriented to person, place, and time.  Psychiatric:        Behavior: Behavior normal.      Musculoskeletal Exam: She had limited lateral rotation of the cervical spine.  She had limited with range of motion of the lumbar spine.  Shoulders, elbows, wrist joints in  good range of motion.  She had bilateral CMC PIP and DIP thickening.  No synovitis was noted.  She had mucinous cyst over the left ring finger DIP joint.  She had ganglion cyst over the volar aspect of her left wrist.  Hip joints and knee joints in good range of motion.  She had no tenderness over ankles or MTPs.  CDAI Exam: CDAI Score: -- Patient Global: --; Provider Global: -- Swollen: --; Tender: -- Joint Exam 04/06/2022   No joint exam has been documented for this visit   There is currently no information documented on the homunculus. Go to the Rheumatology activity and complete the homunculus joint exam.  Investigation: No additional findings.  Imaging: VAS US CAROTID  Result Date: 04/02/2022 Carotid Arterial Duplex Study Patient Name:  LEYAN BRANDEN  Date of Exam:   04/02/2022 Medical Rec #: [id number]             Accession #:    [id number] Date of Birth: August 28, 1945             Patient Gender: F Patient Age:   63 years Exam Location:  [facility] Procedure:      VAS US CAROTID Referring Phys: Harold Barban --------------------------------------------------------------------------------  Indications:       Carotid artery disease. Comparison Study:  12/14/2019                    R=1-39, L=1-39 Performing Technologist: Ronal Fear RVS, RCS  Examination Guidelines: A complete evaluation includes B-mode imaging, spectral Doppler, color Doppler, and power Doppler as needed of all accessible portions of each vessel. Bilateral testing is considered an integral part of a complete examination. Limited examinations for reoccurring indications may be performed as noted.  Right Carotid Findings: +----------+--------+--------+--------+------------------+--------+           PSV cm/sEDV cm/sStenosisPlaque DescriptionComments +----------+--------+--------+--------+------------------+--------+ CCA Prox  48      11                                          +----------+--------+--------+--------+------------------+--------+ CCA Mid   62      17                                         +----------+--------+--------+--------+------------------+--------+  CCA Distal89      15              calcific                   +----------+--------+--------+--------+------------------+--------+ ICA Prox  115     24      1-39%   calcific                   +----------+--------+--------+--------+------------------+--------+ ICA Mid   88      21                                         +----------+--------+--------+--------+------------------+--------+ ICA Distal94      22                                         +----------+--------+--------+--------+------------------+--------+ ECA       135     12              calcific                   +----------+--------+--------+--------+------------------+--------+ +----------+--------+-------+----------------+-------------------+           PSV cm/sEDV cmsDescribe        Arm Pressure (mmHG) +----------+--------+-------+----------------+-------------------+ [id number]            Multiphasic, [id number]                 +----------+--------+-------+----------------+-------------------+ +---------+--------+--+--------+-+---------+ VertebralPSV cm/s33EDV cm/s9Antegrade +---------+--------+--+--------+-+---------+  Left Carotid Findings: +----------+--------+--------+--------+------------------+--------+           PSV cm/sEDV cm/sStenosisPlaque DescriptionComments +----------+--------+--------+--------+------------------+--------+ CCA Prox  57      14                                         +----------+--------+--------+--------+------------------+--------+ CCA Mid   64      14                                         +----------+--------+--------+--------+------------------+--------+ CCA Distal69      19              heterogenous                +----------+--------+--------+--------+------------------+--------+ ICA Prox  44      14      1-39%   calcific                   +----------+--------+--------+--------+------------------+--------+ ICA Mid   63      17                                         +----------+--------+--------+--------+------------------+--------+ ICA Distal70      21                                         +----------+--------+--------+--------+------------------+--------+ ECA       76      12  calcific                   +----------+--------+--------+--------+------------------+--------+ +----------+--------+--------+----------------+-------------------+           PSV cm/sEDV cm/sDescribe        Arm Pressure (mmHG) +----------+--------+--------+----------------+-------------------+ [id number]             Multiphasic, YIF027                 +----------+--------+--------+----------------+-------------------+ +---------+--------+--+--------+--+---------+ VertebralPSV cm/s49EDV cm/s15Antegrade +---------+--------+--+--------+--+---------+   Summary: Right Carotid: Velocities in the right ICA are consistent with a 1-39% stenosis. Left Carotid: Velocities in the left ICA are consistent with a 1-39% stenosis. Vertebrals:  Bilateral vertebral arteries demonstrate antegrade flow. Subclavians: Normal flow hemodynamics were seen in bilateral subclavian              arteries. *See table(s) above for measurements and observations.  Electronically signed by Harold Barban MD on 04/02/2022 at 3:52:46 PM.    Final    DG BONE DENSITY (DXA)  Result Date: 03/14/2022 EXAM: DUAL X-RAY ABSORPTIOMETRY (DXA) FOR BONE MINERAL DENSITY IMPRESSION: Your patient [place] Yanko completed a BMD test on 03/14/2022 using the [place] (software version: 14.10) manufactured by [employer]. The following summarizes the results of our evaluation. Technologist:[facility] PATIENT  BIOGRAPHICAL: Name: Kanyah, Matsushima Patient ID: [id number] Birth Date: 1945-11-11 Height: 65.0 in. Gender: Female Exam Date: 03/14/2022 Weight: 120.0 lbs. Indications: Back surgery, Caucasian, Hyperthyroid, Partial Hysterectomy, Post Menopausal Fractures: Treatments: Plavix, Synthroid, Vitamin D DENSITOMETRY RESULTS: Site      Region     Measured Date Measured Age WHO Classification Young Adult T-score BMD         %Change vs. Previous Significant Change (*) AP Spine L1-L3 2/14/2021 77.1 Normal -0.4 1.116 g/cm2 - - DualFemur Neck Left 03/14/2022 77.1 Osteopenia -1.9 0.773 g/cm2 - - DualFemur Total Mean 03/14/2022 77.1 Osteopenia -1.6 0.804 g/cm2 - - ASSESSMENT: The BMD measured at Femur Neck Left is 0.773 g/cm2 with a T-score of -1.9. This patient is considered osteopenic according to [facility]) criteria. The scan quality is good. L4 was excluded due to surgical repair. World [facility]) criteria for post-menopausal, Caucasian Women: Normal:       T-score at or above -1 SD Osteopenia:   T-score between -1 and -2.5 SD Osteoporosis: T-score at or below -2.5 SD RECOMMENDATIONS: 1. All patients should optimize calcium and vitamin D intake. 2. Consider FDA-approved medical therapies in postmenopausal women and med aged 20 years and older, based on the following: a. A hip or vertebral (clinical or morphometric) fracture b. T-score< -2.5 at the femoral neck or spine after appropriate evaluation to exclude secondary causes c. Low bone mass (T-score between -1.0 and -2.5 at the femoral neck or spine) and a 10-year probability of a hip fracture > 3% or a 10-year probability of a major osteoporosis-related fracture > 20% based on the [place]-adapted WHO algorithm d. Clinician judgment and/or patient preferences may indicate treatment for people with 10-year fracture probabilities above or below these levels FOLLOW-UP: Patients with diagnosis of osteoporsis or at high risk for fracture should  have regular bone mineral density tests. For patients eligible for Medicare, routine testing is allowed once every 2 years. The testing frequency can be increased to one year for patients who have rapidly progressing disease, those who are receiving or discontinuing medical therapy to restore bone mass, or have additional risk factors. I have reviewed this report, and agree with the above  findings. [facility] Your patient IYANNI HEPP completed a FRAX assessment on 03/14/2022 using the [employer] DXA System (analysis version: 14.10) manufactured by [employer]. The following summarizes the results of our evaluation. PATIENT BIOGRAPHICAL: Name: Conny, Situ Patient ID: [id number] Birth Date: 26-Jul-1945 Height:    65.0 in. Gender:     [age]    [age]       Weight:    120.0 lbs. Ethnicity:  White                            Exam Date: 03/14/2022 FRAX* RESULTS:  (version: 3.5) 10-year Probability of Fracture1 Major Osteoporotic Fracture2 Hip Fracture 12.0% 3.4% Population: [place] (Caucasian) Risk Factors: None Based on Femur (Left) Neck BMD 1 -The 10-year probability of fracture may be lower than reported if the patient has received treatment. 2 -Major Osteoporotic Fracture: Clinical Spine, Forearm, Hip or Shoulder *FRAX is a Armed forces logistics/support/administrative officer of the [employer] of [employer] for Metabolic Bone Disease, a World Science writer (WHO) [employer]. ASSESSMENT: The probability of a major osteoporotic fracture is 12.0% within the next ten years. The probability of a hip fracture is 3.4% within the next ten years. Electronically Signed   By: Bary Richard M.D.   On: 03/14/2022 13:42    Recent Labs: Lab Results  Component Value Date   WBC 13.9 (H) 09/23/2021   HGB 12.3 09/23/2021   PLT 236 09/23/2021   NA 134 (L) 09/23/2021   K 4.1 09/23/2021   CL 101 09/23/2021   CO2 22 09/23/2021   GLUCOSE 80 09/23/2021   BUN 23 09/23/2021   CREATININE 1.21  (H) 09/23/2021   BILITOT 0.6 08/02/2021   ALKPHOS 71 09/17/2020   AST 14 08/02/2021   ALT 8 08/02/2021   PROT 7.3 08/02/2021   ALBUMIN 3.9 09/17/2020   CALCIUM 9.1 09/23/2021   GFRAA 52 (L) 06/16/2020   QFTBGOLDPLUS NEGATIVE 08/02/2021    Speciality Comments: No specialty comments available.  Procedures:  No procedures performed Allergies: Erythromycin, Penicillins, Gadolinium derivatives, Iodinated contrast media, Latex, Morphine, Other, Prednisone, Sulfonamide derivatives, Varenicline tartrate, Aspirin, and Atorvastatin   Assessment / Plan:     Visit Diagnoses: Seronegative rheumatoid arthritis (HCC)-she has been doing well on Enbrel 50 mg subcu weekly.  She had no interruption in the therapy.  Patient states she missed 1 dose of Enbrel since the last visit.  She had no synovitis on my examination.  Prescription refill for Enbrel was sent today.  High risk medication use - Enbrel 50 mg sq injections once weekly-started on 11/05/19.  D/c MTX 0.4 ml sq wkly inj-low dose- elevated creatinine and recurrent bronchitis.  Labs obtained on September 23, 2021 were reviewed.  She had elevated WBC count.  Creatinine was elevated.  Patient has been noncompliant for the last 4.  Patient states that she has been under a lot of stress.  She lost her husband in November 2023 from metastatic melanoma.  Will obtain labs today.  TB Gold will be obtained in May 2024.  Information on immunization was placed in the AVS.  She was advised to hold Enbrel if she develops an infection resume after the infection resolves.  Annual skin examination to screen for skin cancer was advised.  Discoid lupus -she gives history of and frequent rash on her chest back and face.  I did not see any discoid lesions today.  She had dry skin.  Use of moisturizing agents were discussed.  Digital mucinous cyst-patient plans to see Dr. [name] in the future.  Tenosynovitis of fingers - Resolved  Tenosynovitis of right wrist -  Resolved  DDD (degenerative disc disease), cervical - Followed by Dr. Lovell Sheehan.  She had limited range of motion with discomfort.  DDD (degenerative disc disease), lumbar - Dr. Lovell Sheehan: She continues to have chronic pain and discomfort.  Patient has been experiencing chronic diarrhea.  She has an appointment coming up with Dr. Loreta Ave for colonoscopy.  Fibromyalgia-she continues to have some generalized pain and discomfort from fibromyalgia.  Need for stretching exercises were discussed.  History of muscle spasm-related to fibromyalgia.  Other medical problems are listed as follows:  History of type 2 diabetes mellitus  History of diabetic gastroparesis  History of asthma  History of COPD  B12 deficiency  Neuropathy - Patient reports having neuropathy secondary to diabetes.  Essential hypertension  History of hyperlipidemia  History of hypothyroidism  History of depression-she has been under a lot of stress.  She recently lost her husband in November.  Her son is suffering from metastatic lung cancer and her daughter has been recently diagnosed with breast cancer.  Smoker  Orders: Orders Placed This Encounter  Procedures   CBC with Differential/Platelet   COMPLETE METABOLIC PANEL WITH GFR   Meds ordered this encounter  Medications   Etanercept (ENBREL MINI) 50 MG/ML SOCT    Sig: Inject 50 mg into the skin once a week.    Dispense:  12 mL    Refill:  0     Follow-Up Instructions: Return in about 5 months (around 09/04/2022) for Rheumatoid arthritis.   Pollyann Savoy, MD  Note - This record has been created using Animal nutritionist.  Chart creation errors have been sought, but may not always  have been located. Such creation errors do not reflect on  the standard of medical care.

## 2022-04-06 ENCOUNTER — Ambulatory Visit: Payer: Medicare Other | Attending: Rheumatology | Admitting: Rheumatology

## 2022-04-06 ENCOUNTER — Encounter: Payer: Self-pay | Admitting: Rheumatology

## 2022-04-06 VITALS — BP 126/61 | HR 62 | Resp 16 | Ht 65.0 in | Wt 125.6 lb

## 2022-04-06 DIAGNOSIS — M0600 Rheumatoid arthritis without rheumatoid factor, unspecified site: Secondary | ICD-10-CM

## 2022-04-06 DIAGNOSIS — M5136 Other intervertebral disc degeneration, lumbar region: Secondary | ICD-10-CM

## 2022-04-06 DIAGNOSIS — M5030 Other cervical disc degeneration, unspecified cervical region: Secondary | ICD-10-CM

## 2022-04-06 DIAGNOSIS — L930 Discoid lupus erythematosus: Secondary | ICD-10-CM

## 2022-04-06 DIAGNOSIS — Z8739 Personal history of other diseases of the musculoskeletal system and connective tissue: Secondary | ICD-10-CM

## 2022-04-06 DIAGNOSIS — Z79899 Other long term (current) drug therapy: Secondary | ICD-10-CM | POA: Diagnosis not present

## 2022-04-06 DIAGNOSIS — M67449 Ganglion, unspecified hand: Secondary | ICD-10-CM | POA: Diagnosis not present

## 2022-04-06 DIAGNOSIS — G629 Polyneuropathy, unspecified: Secondary | ICD-10-CM

## 2022-04-06 DIAGNOSIS — E538 Deficiency of other specified B group vitamins: Secondary | ICD-10-CM

## 2022-04-06 DIAGNOSIS — M659 Synovitis and tenosynovitis, unspecified: Secondary | ICD-10-CM

## 2022-04-06 DIAGNOSIS — Z8709 Personal history of other diseases of the respiratory system: Secondary | ICD-10-CM

## 2022-04-06 DIAGNOSIS — K529 Noninfective gastroenteritis and colitis, unspecified: Secondary | ICD-10-CM

## 2022-04-06 DIAGNOSIS — M51369 Other intervertebral disc degeneration, lumbar region without mention of lumbar back pain or lower extremity pain: Secondary | ICD-10-CM

## 2022-04-06 DIAGNOSIS — M65949 Unspecified synovitis and tenosynovitis, unspecified hand: Secondary | ICD-10-CM

## 2022-04-06 DIAGNOSIS — Z8639 Personal history of other endocrine, nutritional and metabolic disease: Secondary | ICD-10-CM

## 2022-04-06 DIAGNOSIS — Z8659 Personal history of other mental and behavioral disorders: Secondary | ICD-10-CM

## 2022-04-06 DIAGNOSIS — M797 Fibromyalgia: Secondary | ICD-10-CM

## 2022-04-06 DIAGNOSIS — I10 Essential (primary) hypertension: Secondary | ICD-10-CM

## 2022-04-06 DIAGNOSIS — F17200 Nicotine dependence, unspecified, uncomplicated: Secondary | ICD-10-CM

## 2022-04-06 DIAGNOSIS — M65931 Unspecified synovitis and tenosynovitis, right forearm: Secondary | ICD-10-CM

## 2022-04-06 MED ORDER — ENBREL MINI 50 MG/ML ~~LOC~~ SOCT: 50.0000 mg | SUBCUTANEOUS | 0 refills | Status: DC

## 2022-04-06 NOTE — Patient Instructions (Signed)
Standing Labs We placed an order today for your standing lab work.   Please have your standing labs drawn in May and every 3 months TB Gold with next lab  Please have your labs drawn 2 weeks prior to your appointment so that the provider can discuss your lab results at your appointment.  Please note that you may see your imaging and lab results in [place] before we have reviewed them. We will contact you once all results are reviewed. Please allow our office up to 72 hours to thoroughly review all of the results before contacting the office for clarification of your results.  Lab hours are:   Monday through Thursday from 8:00 am -12:30 pm and 1:00 pm-5:00 pm and Friday from 8:00 am-12:00 pm.  Please be advised, all patients with office appointments requiring lab work will take precedent over walk-in lab work.   Labs are drawn by [facility]. Please bring your co-pay at the time of your lab draw.  You may receive a bill from [facility] for your lab work.  Please note if you are on Hydroxychloroquine and and an order has been placed for a Hydroxychloroquine level, you will need to have it drawn 4 hours or more after your last dose.  If you wish to have your labs drawn at another location, please call the office 24 hours in advance so we can fax the orders.  The office is located at [address], [place], [place], [place] [address] No appointment is necessary.    If you have any questions regarding directions or hours of operation,  please call [phone].   As a reminder, please drink plenty of water prior to coming for your lab work. Thanks!   Vaccines You are taking a medication(s) that can suppress your immune system.  The following immunizations are recommended: Flu annually Covid-19  RSV Td/Tdap (tetanus, diphtheria, pertussis) every 10 years Pneumonia (Prevnar 15 then Pneumovax 23 at least 1 year apart.  Alternatively, can take Prevnar 20 without needing additional  dose) Shingrix: 2 doses from 4 weeks to 6 months apart  Please check with your PCP to make sure you are up to date.   If you have signs or symptoms of an infection or start antibiotics: First, call your PCP for workup of your infection. Hold your medication through the infection, until you complete your antibiotics, and until symptoms resolve if you take the following: Injectable medication (Actemra, Benlysta, Cimzia, Cosentyx, Enbrel, Humira, Kevzara, Orencia, Remicade, Simponi, Stelara, Taltz, Tremfya) Methotrexate Leflunomide (Arava) Mycophenolate (Cellcept) Morrie Sheldon, Olumiant, or Rinvoq  Please get an annual skin exam to screen for skin cancer while you are on Enbrel.

## 2022-04-07 LAB — COMPLETE METABOLIC PANEL WITH GFR
AG Ratio: 1.2 (calc) (ref 1.0–2.5)
ALT: 8 U/L (ref 6–29)
AST: 13 U/L (ref 10–35)
Albumin: 4 g/dL (ref 3.6–5.1)
Alkaline phosphatase (APISO): 62 U/L (ref 37–153)
BUN/Creatinine Ratio: 19 (calc) (ref 6–22)
BUN: 20 mg/dL (ref 7–25)
CO2: 26 mmol/L (ref 20–32)
Calcium: 9.1 mg/dL (ref 8.6–10.4)
Chloride: 101 mmol/L (ref 98–110)
Creat: 1.08 mg/dL — ABNORMAL HIGH (ref 0.60–1.00)
Globulin: 3.4 g/dL (calc) (ref 1.9–3.7)
Glucose, Bld: 103 mg/dL — ABNORMAL HIGH (ref 65–99)
Potassium: 4.1 mmol/L (ref 3.5–5.3)
Sodium: 137 mmol/L (ref 135–146)
Total Bilirubin: 0.5 mg/dL (ref 0.2–1.2)
Total Protein: 7.4 g/dL (ref 6.1–8.1)
eGFR: 53 mL/min/{1.73_m2} — ABNORMAL LOW (ref >=60)

## 2022-04-07 LAB — CBC WITH DIFFERENTIAL/PLATELET
Absolute Monocytes: 708 cells/uL (ref 200–950)
Basophils Absolute: 139 cells/uL (ref 0–200)
Basophils Relative: 1.2 %
Eosinophils Absolute: 905 cells/uL — ABNORMAL HIGH (ref 15–500)
Eosinophils Relative: 7.8 %
HCT: 39.4 % (ref 35.0–45.0)
Hemoglobin: 13.5 g/dL (ref 11.7–15.5)
Lymphs Abs: 1844 cells/uL (ref 850–3900)
MCH: 30.9 pg (ref 27.0–33.0)
MCHC: 34.3 g/dL (ref 32.0–36.0)
MCV: 90.2 fL (ref 80.0–100.0)
MPV: 9.6 fL (ref 7.5–12.5)
Monocytes Relative: 6.1 %
Neutro Abs: 8004 cells/uL — ABNORMAL HIGH (ref 1500–7800)
Neutrophils Relative %: 69 %
Platelets: 239 10*3/uL (ref 140–400)
RBC: 4.37 10*6/uL (ref 3.80–5.10)
RDW: 13.1 % (ref 11.0–15.0)
Total Lymphocyte: 15.9 %
WBC: 11.6 10*3/uL — ABNORMAL HIGH (ref 3.8–10.8)

## 2022-04-07 NOTE — Progress Notes (Signed)
Creatinine is elevated most likely due to diuretic use.  White cell count is elevated and stable.  Please forward results to her PCP.

## 2022-05-22 ENCOUNTER — Telehealth: Payer: Self-pay | Admitting: *Deleted

## 2022-05-22 NOTE — Telephone Encounter (Signed)
Dr. Percival Spanish,   [place] last saw this patient in August 2023. She is pending left volar radial wrist mass excision with arthrotomy synovectomy and left ring finger dorsal mass excision with arthrotomy synovectomy and rotation flap was necessary.  She had PCI with DES to [facility] 09/01/2021.  It was documented that you wanted patient to wait a full year prior to temporarily holding Plavix for elective procedures.  Do you recommend her waiting until after 09/02/2022 to hold Plavix for this procedure?  Please route your response to P CV DIV Preop. I will communicate with requesting office once you have given recommendations.   Thank you!  Mayra Reel, NP

## 2022-05-22 NOTE — Telephone Encounter (Signed)
   Pre-operative Risk Assessment    Patient Name: [name]  DOB: [birth year] MRN: [id number]      Request for Surgical Clearance    Procedure:   LEFT VOLAR RADIAL WRIST MASS EXCISION WITH ARTHROTOMY SYNOVECTOMY AND LEFT RING FINGER DORSAL MASS EXCISION WITH ARTHROTOMY SYNOVECTOMY AND ROTATION FLAP AS NECESSARY   Date of Surgery:  Clearance TBD                                 Surgeon:  DR. Roseanne Kaufman Surgeon's Group or Practice Name:  [facility] Phone number:  [phone] Fax number:  [phone] ATTN: [name]   Type of Clearance Requested:   - Medical  - Pharmacy:  Hold Clopidogrel (Plavix)     Type of Anesthesia:   ANESTHESIA-BLOCK WITH IV SEDATION   Additional requests/questions:    Jiles Prows   05/22/2022, 12:10 PM

## 2022-05-23 ENCOUNTER — Emergency Department (HOSPITAL_BASED_OUTPATIENT_CLINIC_OR_DEPARTMENT_OTHER)
Admission: EM | Admit: 2022-05-23 | Discharge: 2022-05-23 | Disposition: A | Discharge status: Home / Self Care | Payer: Medicare Other | Attending: Emergency Medicine | Admitting: Emergency Medicine

## 2022-05-23 ENCOUNTER — Other Ambulatory Visit: Payer: Self-pay

## 2022-05-23 ENCOUNTER — Encounter (HOSPITAL_BASED_OUTPATIENT_CLINIC_OR_DEPARTMENT_OTHER): Payer: Self-pay

## 2022-05-23 ENCOUNTER — Emergency Department (HOSPITAL_BASED_OUTPATIENT_CLINIC_OR_DEPARTMENT_OTHER): Payer: Medicare Other | Admitting: Radiology

## 2022-05-23 DIAGNOSIS — R058 Other specified cough: Secondary | ICD-10-CM

## 2022-05-23 DIAGNOSIS — Z794 Long term (current) use of insulin: Secondary | ICD-10-CM | POA: Diagnosis not present

## 2022-05-23 DIAGNOSIS — Z91040 Latex allergy status: Secondary | ICD-10-CM | POA: Insufficient documentation

## 2022-05-23 DIAGNOSIS — Z7902 Long term (current) use of antithrombotics/antiplatelets: Secondary | ICD-10-CM | POA: Insufficient documentation

## 2022-05-23 DIAGNOSIS — F17200 Nicotine dependence, unspecified, uncomplicated: Secondary | ICD-10-CM | POA: Diagnosis not present

## 2022-05-23 DIAGNOSIS — E876 Hypokalemia: Secondary | ICD-10-CM | POA: Diagnosis not present

## 2022-05-23 DIAGNOSIS — Z1152 Encounter for screening for COVID-19: Secondary | ICD-10-CM | POA: Diagnosis not present

## 2022-05-23 DIAGNOSIS — J441 Chronic obstructive pulmonary disease with (acute) exacerbation: Secondary | ICD-10-CM | POA: Insufficient documentation

## 2022-05-23 DIAGNOSIS — R0602 Shortness of breath: Secondary | ICD-10-CM | POA: Diagnosis present

## 2022-05-23 LAB — CBC
HCT: 40.8 % (ref 36.0–46.0)
Hemoglobin: 13.9 g/dL (ref 12.0–15.0)
MCH: 30.6 pg (ref 26.0–34.0)
MCHC: 34.1 g/dL (ref 30.0–36.0)
MCV: 89.9 fL (ref 80.0–100.0)
Platelets: 264 10*3/uL (ref 150–400)
RBC: 4.54 MIL/uL (ref 3.87–5.11)
RDW: 13.4 % (ref 11.5–15.5)
WBC: 14.7 10*3/uL — ABNORMAL HIGH (ref 4.0–10.5)
nRBC: 0 % (ref 0.0–0.2)

## 2022-05-23 LAB — BASIC METABOLIC PANEL
Anion gap: 11 (ref 5–15)
BUN: 22 mg/dL (ref 8–23)
CO2: 28 mmol/L (ref 22–32)
Calcium: 9.2 mg/dL (ref 8.9–10.3)
Chloride: 97 mmol/L — ABNORMAL LOW (ref 98–111)
Creatinine, Ser: 1.11 mg/dL — ABNORMAL HIGH (ref 0.44–1.00)
GFR, Estimated: 51 mL/min — ABNORMAL LOW (ref >60)
Glucose, Bld: 131 mg/dL — ABNORMAL HIGH (ref 70–99)
Potassium: 3.2 mmol/L — ABNORMAL LOW (ref 3.5–5.1)
Sodium: 136 mmol/L (ref 135–145)

## 2022-05-23 LAB — RESP PANEL BY RT-PCR (RSV, FLU A&B, COVID)  RVPGX2
Influenza A by PCR: NEGATIVE
Influenza B by PCR: NEGATIVE
Resp Syncytial Virus by PCR: NEGATIVE
SARS Coronavirus 2 by RT PCR: NEGATIVE

## 2022-05-23 LAB — TROPONIN I (HIGH SENSITIVITY)
Troponin I (High Sensitivity): 4 ng/L (ref <18)
Troponin I (High Sensitivity): 3 ng/L (ref <18)

## 2022-05-23 MED ORDER — ALBUTEROL SULFATE (2.5 MG/3ML) 0.083% IN NEBU
5.0000 mg | INHALATION_SOLUTION | Freq: Once | RESPIRATORY_TRACT | Status: AC
  Administered 2022-05-23: 5 mg via RESPIRATORY_TRACT
  Filled 2022-05-23: qty 6

## 2022-05-23 MED ORDER — IPRATROPIUM-ALBUTEROL 0.5-2.5 (3) MG/3ML IN SOLN
3.0000 mL | RESPIRATORY_TRACT | Status: AC
  Administered 2022-05-23: 3 mL via RESPIRATORY_TRACT
  Filled 2022-05-23: qty 3

## 2022-05-23 MED ORDER — POTASSIUM CHLORIDE CRYS ER 20 MEQ PO TBCR
40.0000 meq | EXTENDED_RELEASE_TABLET | Freq: Once | ORAL | Status: AC
  Administered 2022-05-23: 40 meq via ORAL
  Filled 2022-05-23: qty 2

## 2022-05-23 MED ORDER — METHYLPREDNISOLONE 4 MG PO TBPK: ORAL_TABLET | ORAL | 0 refills | Status: DC

## 2022-05-23 MED ORDER — IPRATROPIUM BROMIDE 0.02 % IN SOLN
0.5000 mg | Freq: Once | RESPIRATORY_TRACT | Status: AC
  Administered 2022-05-23: 0.5 mg via RESPIRATORY_TRACT
  Filled 2022-05-23: qty 2.5

## 2022-05-23 NOTE — ED Notes (Signed)
Discharge paperwork given and verbally understood. 

## 2022-05-23 NOTE — ED Triage Notes (Signed)
Patient here POV from Home.  Endorses SOB that began 2 Weeks ago. Also notes some Tightness in Chest that is located through Back.  Has taken Prednisone and Antibiotics which has not been effective. No Fever.   NAD Noted during Triage. A&Ox4. GCS 15. Ambulatory.

## 2022-05-23 NOTE — Discharge Instructions (Addendum)
It was our pleasure to provide your ER care today - we hope that you feel better.  Take medrol dose pack. Use albuterol treatment every 4 hours as need. Avoid any smoking.  Follow up closely with primary care doctor/pulmonary physician in the next 1-2 weeks.   Your potassium level is mildly low - eat plenty of fruits and vegetables, and follow up with primary care doctor.   Return to ER if worse, new symptoms, fevers, chest pain, increased trouble breathing, or other concern.

## 2022-05-23 NOTE — Telephone Encounter (Signed)
Patient made aware that clearance will be addressed at appointment on 07/25/22 and that her surgery should be postponed until after 09/02/22. Attempted to reach requesting office to make then aware but no answer. Lvm.  Will route this note as a [place]

## 2022-05-23 NOTE — Telephone Encounter (Signed)
   Name: [name]  DOB: 1945/11/29  MRN: [id number]  Primary Cardiologist: Minus Breeding, MD  Chart reviewed as part of pre-operative protocol coverage. Because of [name]'s past medical history she will need to postpone elective surgery until she is one year out from her PCI (09/01/2021), per Dr. Percival Spanish. She is scheduled for a follow-up visit with Dr. Percival Spanish on 07/25/2022. I have updated appointment notes to reflect need for preoperative clearance.    Pre-op covering staff:  - Please contact requesting surgeon's office via preferred method (i.e, phone, fax) to inform them of need to postpone date of surgery until after 09/02/2022.   Mayra Reel, NP  05/23/2022, 3:08 PM

## 2022-05-23 NOTE — ED Provider Notes (Signed)
[facility] Provider Note   CSN: [id number] Arrival date & time: 05/23/22  1723     History  Chief Complaint  Patient presents with   Shortness of [place] is a 77 y.o. female.  Pt with hx copd, c/o feeling sob in past few weeks. Has completed two course of abx, zithromax and cipro, and recent course prednisone. +smoker. Denies chest pain - no nv or diaphoresis. Occasional non prod cough. No sore throat. No specific known ill contacts. No fever or chills. No abd pain or nausea/vomiting. Denies leg pain or swelling.   The history is provided by the patient, medical records and a friend.  Shortness of Breath Associated symptoms: cough   Associated symptoms: no abdominal pain, no chest pain, no fever, no headaches, no neck pain, no rash, no sore throat and no vomiting        Home Medications Prior to Admission medications   Medication Sig Start Date End Date Taking? Authorizing Provider  albuterol (VENTOLIN HFA) 108 (90 Base) MCG/ACT inhaler Inhale 2 puffs into the lungs every 8 (eight) hours as needed (asthma / chest congestion). 04/20/19   [provider]  ALPRAZolam Duanne Moron) 0.25 MG tablet Take 0.25 mg by mouth 2 (two) times daily as needed for anxiety. 08/23/21   [provider]  BREZTRI AEROSPHERE 160-9-4.8 MCG/ACT AERO Inhale 2 puffs into the lungs 2 (two) times daily. 06/16/19   [provider]  clopidogrel (PLAVIX) 75 MG tablet Take 1 tablet (75 mg total) by mouth daily. 08/30/21   Minus Breeding, MD  Cyanocobalamin (B-12 COMPLIANCE INJECTION) 1000 MCG/ML KIT Vitamin B12 1038mcg IM injection daily for 7 days, then weekly for 4 weeks, then monthly thereafter for 1 year. Patient taking differently: Inject 1,000 mcg as directed every 30 (thirty) days. 06/16/21   Patel, Arvin Collard K, DO  donepezil (ARICEPT) 5 MG tablet Take 5 mg by mouth at bedtime.    [provider]  Etanercept (ENBREL  MINI) 50 MG/ML SOCT Inject 50 mg into the skin once a week. 04/06/22   Bo Merino, MD  insulin glargine, 1 Unit Dial, (TOUJEO SOLOSTAR) 300 UNIT/ML Solostar Pen Inject 0-50 Units into the skin See admin instructions. Sliding scale  Under 120=0 Over 120= 10-20 units in the morning Bedtime 20 units if needed, can take up to 50 units depending in Blood glucose  35 units in the morning and 50 units at bedtime.    [provider]  isosorbide mononitrate (IMDUR) 60 MG 24 hr tablet Take 1 tablet (60 mg total) by mouth daily. 10/11/21   Minus Breeding, MD  levothyroxine (SYNTHROID, LEVOTHROID) 50 MCG tablet Take 50 mcg by mouth daily before breakfast.    [provider]  metFORMIN (GLUCOPHAGE) 1000 MG tablet Take 1 tablet (1,000 mg total) by mouth daily with breakfast. 09/04/21   Richardson Dopp T, PA-C  nitroGLYCERIN (NITROSTAT) 0.4 MG SL tablet Place 1 tablet (0.4 mg total) under the tongue every 5 (five) minutes x 3 doses as needed for chest pain. If no relief after 3rd dose, proceed to the ED for an evaluation 08/09/21   Minus Breeding, MD  [facility] VERIO test strip 1 each 4 (four) times daily. 11/10/19   [provider]  sertraline (ZOLOFT) 100 MG tablet Take 100 mg by mouth daily.     [provider]  triamterene-hydrochlorothiazide (MAXZIDE-25) 37.5-25 MG tablet Take 1 tablet by mouth daily.    [provider]  VITAMIN D PO Take 5,000 Units by mouth daily.    [provider]  zolpidem (AMBIEN) 5 MG tablet Take 5 mg by mouth at bedtime. For sleep    [provider]      Allergies    Erythromycin, Penicillins, Gadolinium derivatives, Iodinated contrast media, Latex, Morphine, Other, Prednisone, Sulfonamide derivatives, Varenicline tartrate, Aspirin, and Atorvastatin    Review of Systems   Review of Systems  Constitutional:  Negative for chills and fever.  HENT:  Negative for sore throat.   Eyes:  Negative for redness.  Respiratory:   Positive for cough and shortness of breath.   Cardiovascular:  Negative for chest pain, palpitations and leg swelling.  Gastrointestinal:  Negative for abdominal pain and vomiting.  Genitourinary:  Negative for flank pain.  Musculoskeletal:  Negative for back pain and neck pain.  Skin:  Negative for rash.  Neurological:  Negative for headaches.  Hematological:  Does not bruise/bleed easily.  Psychiatric/Behavioral:  Negative for confusion.     Physical Exam Updated Vital Signs BP 121/65 (BP Location: Right Arm)   Pulse 75   Temp 97.6 F (36.4 C) (Oral)   Resp (!) 22   Ht 1.651 m (5\' 5" )   Wt 57 kg   SpO2 96%   BMI 20.91 kg/m  Physical Exam Vitals and nursing note reviewed.  Constitutional:      Appearance: Normal appearance. She is well-developed.  HENT:     Head: Atraumatic.     Nose: Nose normal.     Mouth/Throat:     Mouth: Mucous membranes are moist.  Eyes:     General: No scleral icterus.    Conjunctiva/sclera: Conjunctivae normal.     Pupils: Pupils are equal, round, and reactive to light.  Neck:     Trachea: No tracheal deviation.  Cardiovascular:     Rate and Rhythm: Normal rate and regular rhythm.     Pulses: Normal pulses.     Heart sounds: Normal heart sounds. No murmur heard.    No friction rub. No gallop.  Pulmonary:     Effort: Pulmonary effort is normal. No respiratory distress.     Breath sounds: Wheezing present.  Abdominal:     General: Bowel sounds are normal. There is no distension.     Palpations: Abdomen is soft.     Tenderness: There is no abdominal tenderness.  Genitourinary:    Comments: No cva tenderness.  Musculoskeletal:        General: No swelling or tenderness.     Cervical back: Normal range of motion and neck supple. No rigidity. No muscular tenderness.  Skin:    General: Skin is warm and dry.     Findings: No rash.  Neurological:     Mental Status: She is alert.     Comments: Alert, speech normal.   Psychiatric:         Mood and Affect: Mood normal.     ED Results / Procedures / Treatments   Labs (all labs ordered are listed, but only abnormal results are displayed) Results for orders placed or performed during the hospital encounter of 05/23/22  Resp panel by RT-PCR (RSV, Flu A&B, Covid) Anterior Nasal Swab   Specimen: Anterior Nasal Swab  Result Value Ref Range   SARS Coronavirus 2 by RT PCR NEGATIVE NEGATIVE   Influenza A by PCR NEGATIVE NEGATIVE   Influenza B by PCR NEGATIVE NEGATIVE   Resp Syncytial Virus by PCR NEGATIVE NEGATIVE  Basic  metabolic panel  Result Value Ref Range   Sodium 136 135 - 145 mmol/L   Potassium 3.2 (L) 3.5 - 5.1 mmol/L   Chloride 97 (L) 98 - 111 mmol/L   CO2 28 22 - 32 mmol/L   Glucose, Bld 131 (H) 70 - 99 mg/dL   BUN 22 8 - 23 mg/dL   Creatinine, Ser 1.11 (H) 0.44 - 1.00 mg/dL   Calcium 9.2 8.9 - 10.3 mg/dL   GFR, Estimated 51 (L) >60 mL/min   Anion gap 11 5 - 15  CBC  Result Value Ref Range   WBC 14.7 (H) 4.0 - 10.5 K/uL   RBC 4.54 3.87 - 5.11 MIL/uL   Hemoglobin 13.9 12.0 - 15.0 g/dL   HCT 40.8 36.0 - 46.0 %   MCV 89.9 80.0 - 100.0 fL   MCH 30.6 26.0 - 34.0 pg   MCHC 34.1 30.0 - 36.0 g/dL   RDW 13.4 11.5 - 15.5 %   Platelets 264 150 - 400 K/uL   nRBC 0.0 0.0 - 0.2 %  Troponin I (High Sensitivity)  Result Value Ref Range   Troponin I (High Sensitivity) 4 <18 ng/L  Troponin I (High Sensitivity)  Result Value Ref Range   Troponin I (High Sensitivity) 3 <18 ng/L   DG Chest 2 View  Result Date: 05/23/2022 CLINICAL DATA:  Chest tightness and shortness of breath. EXAM: CHEST - 2 VIEW COMPARISON:  September 17, 2020 FINDINGS: The heart size and mediastinal contours are within normal limits. There is mild calcification of the aortic arch. The lungs are hyperinflated. Mild, diffuse, chronic appearing increased interstitial lung markings are seen. The visualized skeletal structures are unremarkable. IMPRESSION: COPD without evidence of acute or active cardiopulmonary  disease. Electronically Signed   By: Virgina Norfolk M.D.   On: 05/23/2022 18:39     EKG EKG Interpretation  Date/Time:  Wednesday May 23 2022 17:47:05 EDT Ventricular Rate:  85 PR Interval:  178 QRS Duration: 76 QT Interval:  360 QTC Calculation: 428 R Axis:   91 Text Interpretation: Sinus rhythm with Premature atrial complexes Non-specific ST-t changes Confirmed by Lajean Saver [phone]) on 05/23/2022 6:55:52 PM  Radiology DG Chest 2 View  Result Date: 05/23/2022 CLINICAL DATA:  Chest tightness and shortness of breath. EXAM: CHEST - 2 VIEW COMPARISON:  September 17, 2020 FINDINGS: The heart size and mediastinal contours are within normal limits. There is mild calcification of the aortic arch. The lungs are hyperinflated. Mild, diffuse, chronic appearing increased interstitial lung markings are seen. The visualized skeletal structures are unremarkable. IMPRESSION: COPD without evidence of acute or active cardiopulmonary disease. Electronically Signed   By: Virgina Norfolk M.D.   On: 05/23/2022 18:39    Procedures Procedures    Medications Ordered in ED Medications  ipratropium-albuterol (DUONEB) 0.5-2.5 (3) MG/3ML nebulizer solution 3 mL (3 mLs Nebulization Given 05/23/22 1757)  potassium chloride SA (KLOR-CON M) CR tablet 40 mEq (40 mEq Oral Given 05/23/22 2012)  albuterol (PROVENTIL) (2.5 MG/3ML) 0.083% nebulizer solution 5 mg (5 mg Nebulization Given 05/23/22 2023)  ipratropium (ATROVENT) nebulizer solution 0.5 mg (0.5 mg Nebulization Given 05/23/22 2023)    ED Course/ Medical Decision Making/ A&P                             Medical Decision Making Problems Addressed: COPD exacerbation [facility]): acute illness or injury with systemic symptoms that poses a threat to life or bodily functions Hypokalemia: acute  illness or injury Non-productive cough: chronic illness or injury Smoking: chronic illness or injury that poses a threat to life or bodily functions  Amount and/or  Complexity of Data Reviewed Independent Historian: friend    Details: hx External Data Reviewed: notes. Labs: ordered. Decision-making details documented in ED Course. Radiology: ordered and independent interpretation performed. Decision-making details documented in ED Course. ECG/medicine tests: ordered and independent interpretation performed. Decision-making details documented in ED Course.  Risk Prescription drug management. Decision regarding hospitalization.   Iv ns. Continuous pulse ox and cardiac monitoring. Labs ordered/sent. Imaging ordered.   Differential diagnosis includes viral syndrome, copd exacerbation, pna, etc  . Dispo decision including potential need for admission considered - will get labs and imaging and reassess.   Reviewed nursing notes and prior charts for additional history. External reports reviewed. Additional history from: friend.   Cardiac monitor: sinus rhythm, rate 74.  Labs reviewed/interpreted by me - k mildly low. Kcl po. Covid and flu neg.   Xrays reviewed/interpreted by me - no pna.   Pt indicates does better with medrol dose pack.  Also rec pulm f/u, possible screening ct, optimization outpt meds, etc.   Pt currently appears stable for d/c.   Return precautions provided.          Final Clinical Impression(s) / ED Diagnoses Final diagnoses:  None    Rx / DC Orders ED Discharge Orders     None         Lajean Saver, MD 05/23/22 2034

## 2022-07-09 ENCOUNTER — Other Ambulatory Visit: Payer: Self-pay | Admitting: Cardiology

## 2022-07-22 NOTE — Progress Notes (Unsigned)
  Cardiology Office Note:   Date:  07/25/2022  ID:  JANELY RAPALO, DOB Sep 07, 1945, MRN [id number]  History of Present Illness:   [name] is a 77 y.o. 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  She has a history of coronary artery disease.  She had catheterization in June 2023.  Right coronary artery has 40 and 60% stenosis.  PDA has 60% stenosis.  Second diagonal has 90% stenosis and had a stent.  There was nonobstructive 50% disease in the LAD.  Since that procedure she has had no cardiovascular complaints.  She is having none of the chest discomfort that she was having.  The patient denies any new symptoms such as chest discomfort, neck or arm discomfort. There has been no new shortness of breath, PND or orthopnea. There have been no reported palpitations, presyncope or syncope.  Of note she had lots of fatigue and aches and other complaints that she describes to isosorbide and wants this medicine.  She finally started to recover.  ROS: Back pain.  Studies Reviewed:    EKG: Sinus rhythm, rate 71, axis within normal limits, intervals within normal limits, no acute ST-T wave changes.     Risk Assessment/Calculations:         Physical Exam:   VS:  BP 116/70   Pulse 71   Ht 5\' 5"  (1.651 m)   Wt 120 lb (54.4 kg)   BMI 19.97 kg/m    Wt Readings from Last 3 Encounters:  07/25/22 120 lb (54.4 kg)  05/23/22 125 lb 10.6 oz (57 kg)  04/06/22 125 lb 9.6 oz (57 kg)     GEN: Well nourished, well developed in no acute distress NECK: No JVD; No carotid bruits CARDIAC: RRR, no murmurs, rubs, gallops RESPIRATORY:  Clear to auscultation without rales, wheezing or rhonchi  ABDOMEN: Soft, non-tender, non-distended EXTREMITIES:  No edema; No deformity   ASSESSMENT AND PLAN:    Preop: The patient is going to have surgery as above.  She has no high risk cardiovascular findings.  She is physically active.  It is not a high risk procedure from a  cardiovascular standpoint.  No further testing is indicated and based on ACC/AHA guidelines she is at acceptable risk for the planned procedure.  CAD: She has no new symptoms.  She does not think she is tolerating the Plavix and wants to be switched to an aspirin and I will switch her to low-dose.  She will continue with risk reduction.  HTN:   The blood pressure is at target.  No change in therapy.    Type 2 diabetes: She says this is followed by her primary provider and they are happy with her A1c.  I will try to get these records.    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  She would consider PCSK9 and I will make referral to our pharmacist to see if we can get this started.  She is not at target with her lipids per her primary provider and I will get those results scanned in.  I do not have them in the office with me today.       Signed, Rollene Rotunda, MD

## 2022-07-25 ENCOUNTER — Ambulatory Visit: Payer: Medicare Other | Admitting: Cardiology

## 2022-07-25 ENCOUNTER — Encounter: Payer: Self-pay | Admitting: Cardiology

## 2022-07-25 VITALS — BP 116/70 | HR 71 | Ht 65.0 in | Wt 120.0 lb

## 2022-07-25 DIAGNOSIS — E785 Hyperlipidemia, unspecified: Secondary | ICD-10-CM

## 2022-07-25 DIAGNOSIS — E118 Type 2 diabetes mellitus with unspecified complications: Secondary | ICD-10-CM | POA: Diagnosis not present

## 2022-07-25 DIAGNOSIS — Z789 Other specified health status: Secondary | ICD-10-CM

## 2022-07-25 DIAGNOSIS — I10 Essential (primary) hypertension: Secondary | ICD-10-CM | POA: Diagnosis not present

## 2022-07-25 DIAGNOSIS — Z01810 Encounter for preprocedural cardiovascular examination: Secondary | ICD-10-CM | POA: Diagnosis not present

## 2022-07-25 DIAGNOSIS — R072 Precordial pain: Secondary | ICD-10-CM

## 2022-07-25 DIAGNOSIS — Z7984 Long term (current) use of oral hypoglycemic drugs: Secondary | ICD-10-CM

## 2022-07-25 MED ORDER — ASPIRIN 81 MG PO TBEC: 81.0000 mg | DELAYED_RELEASE_TABLET | Freq: Every day | ORAL | 3 refills | Status: DC

## 2022-07-25 NOTE — Patient Instructions (Signed)
Medication Instructions:  Please discontinue your Plavix and start Asprin daily. Continue all other medications as listed.  *If you need a refill on your cardiac medications before your next appointment, please call your pharmacy*  You have been referred to our pharmacy team to discuss Repatha treatment.  You will be contacted to be scheduled.  Follow-Up: At [facility], you and your health needs are our priority.  As part of our continuing mission to provide you with exceptional heart care, we have created designated Provider Care Teams.  These Care Teams include your primary Cardiologist (physician) and Advanced Practice Providers (APPs -  Physician Assistants and Nurse Practitioners) who all work together to provide you with the care you need, when you need it.  We recommend signing up for the patient portal called "MyChart".  Sign up information is provided on this After Visit Summary.  MyChart is used to connect with patients for Virtual Visits (Telemedicine).  Patients are able to view lab/test results, encounter notes, upcoming appointments, etc.  Non-urgent messages can be sent to your provider as well.   To learn more about what you can do with MyChart, go to [email or web address].    Your next appointment:   1 year(s)  Provider:   Rollene Rotunda, MD

## 2022-07-26 ENCOUNTER — Other Ambulatory Visit: Payer: Self-pay

## 2022-07-26 MED ORDER — ENBREL MINI 50 MG/ML ~~LOC~~ SOCT: 50.0000 mg | SUBCUTANEOUS | 0 refills | Status: DC

## 2022-07-26 NOTE — Telephone Encounter (Signed)
Patient called requesting refill of enbrel to [facility].   Last Fill: 04/06/2022  Labs: 05/23/2022 CBC & BMP: WBC 14.7, potassium 3.2, chloride 97, glucose 131, creatinine 1.11, GFR 51  TB Gold: 08/02/2021 negative    Next Visit: 09/19/2022  Last Visit: 04/06/2022  [id number] rheumatoid arthritis    Current Dose per office note on 04/06/2022: Enbrel 50 mg sq injections once weekly   Okay to refill Enbrel?

## 2022-08-02 ENCOUNTER — Ambulatory Visit: Payer: Medicare Other

## 2022-08-15 ENCOUNTER — Other Ambulatory Visit: Payer: Self-pay | Admitting: Neurosurgery

## 2022-08-15 DIAGNOSIS — M5441 Lumbago with sciatica, right side: Secondary | ICD-10-CM

## 2022-08-20 ENCOUNTER — Other Ambulatory Visit (HOSPITAL_COMMUNITY): Payer: Self-pay | Admitting: Neurosurgery

## 2022-08-20 DIAGNOSIS — M5441 Lumbago with sciatica, right side: Secondary | ICD-10-CM

## 2022-08-28 ENCOUNTER — Ambulatory Visit: Payer: Medicare Other | Attending: Cardiology | Admitting: Pharmacist Clinician (PhC)/ Clinical Pharmacy Specialist

## 2022-08-28 DIAGNOSIS — E785 Hyperlipidemia, unspecified: Secondary | ICD-10-CM

## 2022-08-28 NOTE — Patient Instructions (Signed)
Your Results:             Your most recent labs Goal  Total Cholesterol  < 200  Triglycerides  < 150  HDL (happy/good cholesterol)  > 40  LDL (lousy/bad cholesterol  < 55   Medication changes:  We will start the process to get Repatha covered by your insurance.  Once the prior authorization is complete, I will call/send a MyChart message to let you know and confirm pharmacy information.   You will take Repatha  Lab orders:  We want to repeat labs after 2-3 months.  We will send you a lab order to remind you once we get closer to that time.    Patient Assistance:    We will sign you up for a [employer] Grant once your medication is approved by [employer].  I will call you with the ID number, then you will take this information to the pharmacy.  They will bill it after your insurance, bringing your copay to $0.  The grant will pay the first $2,500 in a one year period.    ID   BIN [id number]  PCN [id number]  GRP [id number]    Thank you for choosing [facility]

## 2022-08-28 NOTE — Progress Notes (Unsigned)
Office Visit    Patient Name: [name] Date of Encounter: 08/29/2022  Primary Care Provider:  Andi Devon, MD Primary Cardiologist:  Rollene Rotunda, MD  Chief Complaint    Hyperlipidemia   Significant Past Medical History   CAD 6/23 cath - 40, 60% stenosis of RCA, 60% PDA stenosis, stent to 2dx, 50% to LAD  DM2 5.6 per pt recall - followed by PCP  HTN Currently controlled with lifestyle     Allergies  Allergen Reactions   Erythromycin Anaphylaxis   Penicillins Anaphylaxis   Gadolinium Derivatives Hives and Rash    Pt broke out in hives all over body.  Has IV steroids and Benadryl in hospital for MRI.   Iodinated Contrast Media Hives    13-hour prep Pt had slight itching of her abd after her injection and had taken her 13 hr prep.  Consult with radiologist before another procedure to see if IV contrast is necessary, per Dr Roswell Nickel on 01/06/14, JB/   Latex Dermatitis and Other (See Comments)    blisters   Morphine Nausea And Vomiting   Other Itching and Other (See Comments)    Chlorhexin (CHG); "burning"   Prednisone Other (See Comments)    "shakes me out of my frame" IM steroids aren't as bad as PO   Sulfonamide Derivatives Itching and Rash    All over body    Varenicline Tartrate Nausea And Vomiting        Isosorbide Nausea Only    "Short of breath and extremely tired and week also"   Aspirin Nausea Only and Other (See Comments)    Burning in stomach    Atorvastatin Other (See Comments)    System will not process    History of Present Illness    [name] is a 77 y.o. female patient of Dr Antoine Poche, in the office today to discuss options for cholesterol management.     Insurance Carrier: [employer] Medicare  LDL Cholesterol goal:  LDL < 55  Current Medications:   none  Previously tried:  simvastatin , another previously  Family Hx:  mother died from heart disease at 66, post CABG in late 19s, CHF, DM; father had pacemaker, died  multiple myeloma; brother had MI at 75, ssiter has heart issues, now 62; son had CABG, mitral valve in his 58's, lung cancer now   Social Hx: Tobacco: up to ppd; not interested in quitting Alcohol: no     Diet:   home cooked meals, makes meals ahead and freezes, prefers vegetables, protein is fish, chicken occasional beef, lots of beans, doesn't snack much  Exercise: none, house work, gardening,   Accessory Clinical Findings   Lab Results  Component Value Date   CHOL (H) 09/07/2006    271        ATP III CLASSIFICATION:  <200     mg/dL   Desirable  295-621  mg/dL   Borderline High  >=308    mg/dL   High   HDL 31 (L) 65/78/4696   LDLCALC (H) 09/07/2006    180        Total Cholesterol/HDL:CHD Risk Coronary Heart Disease Risk Table                     Men   Women  1/2 Average Risk   3.4   3.3   TRIG 300 (H) 09/07/2006   CHOLHDL 8.7 09/07/2006    Lipoprotein (a)  Date/Time Value Ref Range Status  09/02/2021 12:21 AM 104.3 (H) <75.0 nmol/L Final    Comment:    (NOTE) Note:  Values greater than or equal to 75.0 nmol/L may       indicate an independent risk factor for CHD,       but must be evaluated with caution when applied       to non-Caucasian populations due to the       influence of genetic factors on Lp(a) across       ethnicities. Performed At: [facility] [address] [place], [name] [id number] Jolene Schimke MD [id number]     Lab Results  Component Value Date   ALT 8 04/06/2022   AST 13 04/06/2022   ALKPHOS 71 09/17/2020   BILITOT 0.5 04/06/2022   Lab Results  Component Value Date   CREATININE 1.11 (H) 05/23/2022   BUN 22 05/23/2022   NA 136 05/23/2022   K 3.2 (L) 05/23/2022   CL 97 (L) 05/23/2022   CO2 28 05/23/2022   Lab Results  Component Value Date   HGBA1C 6.0 (H) 09/01/2021    Home Medications    Current Outpatient Medications  Medication Sig Dispense Refill   albuterol (VENTOLIN HFA) 108 (90 Base) MCG/ACT inhaler Inhale  2 puffs into the lungs every 8 (eight) hours as needed (asthma / chest congestion).     ALPRAZolam (XANAX) 0.25 MG tablet Take 0.25 mg by mouth 2 (two) times daily as needed for anxiety.     aspirin EC 81 MG tablet Take 1 tablet (81 mg total) by mouth daily. Swallow whole. 90 tablet 3   BREZTRI AEROSPHERE 160-9-4.8 MCG/ACT AERO Inhale 2 puffs into the lungs 2 (two) times daily.     donepezil (ARICEPT) 5 MG tablet Take 5 mg by mouth at bedtime.     Etanercept (ENBREL MINI) 50 MG/ML SOCT Inject 50 mg into the skin once a week. 12 mL 0   insulin glargine, 1 Unit Dial, (TOUJEO SOLOSTAR) 300 UNIT/ML Solostar Pen Inject 0-50 Units into the skin See admin instructions. Sliding scale  Under 120=0 Over 120= 10-20 units in the morning Bedtime 20 units if needed, can take up to 50 units depending in Blood glucose  35 units in the morning and 50 units at bedtime.     levothyroxine (SYNTHROID, LEVOTHROID) 50 MCG tablet Take 50 mcg by mouth daily before breakfast.     metFORMIN (GLUCOPHAGE) 1000 MG tablet Take 1 tablet (1,000 mg total) by mouth daily with breakfast.     nitroGLYCERIN (NITROSTAT) 0.4 MG SL tablet Place 1 tablet (0.4 mg total) under the tongue every 5 (five) minutes x 3 doses as needed for chest pain. If no relief after 3rd dose, proceed to the ED for an evaluation 25 tablet 3   ONETOUCH VERIO test strip 1 each 4 (four) times daily.     sertraline (ZOLOFT) 100 MG tablet Take 100 mg by mouth daily.      triamterene-hydrochlorothiazide (MAXZIDE-25) 37.5-25 MG tablet Take 1 tablet by mouth daily.     VITAMIN D PO Take 5,000 Units by mouth daily.     zolpidem (AMBIEN) 5 MG tablet Take 5 mg by mouth at bedtime. For sleep     No current facility-administered medications for this visit.     Assessment & Plan    Hyperlipidemia LDL goal <70 Assessment: Patient with ASCVD not at LDL goal of < 55 Will reach out to PCP for most recent lipid labs Not able to tolerate statins secondary  to  myalgias Reviewed options for lowering LDL cholesterol, including ezetimibe, PCSK-9 inhibitors, bempedoic acid and inclisiran.  Discussed mechanisms of action, dosing, side effects, potential decreases in LDL cholesterol and costs.  Also reviewed potential options for patient assistance.  Plan: Patient agreeable to starting Repatha 140 mg Repeat labs after:  3 months Lipid Liver function Patient was given information on [employer] - will sign patient up when PA approved Marital status - widowed Income < $72,000 (single) or < $102,000 (married) - yes   Phillips Hay, PharmD CPP [facility] [address]  [place], [name] [address] [phone]  08/29/2022, 6:25 AM

## 2022-08-29 ENCOUNTER — Encounter: Payer: Self-pay | Admitting: Pharmacist Clinician (PhC)/ Clinical Pharmacy Specialist

## 2022-08-29 NOTE — Assessment & Plan Note (Signed)
Assessment: Patient with ASCVD not at LDL goal of < 55 Will reach out to PCP for most recent lipid labs Not able to tolerate statins secondary to myalgias Reviewed options for lowering LDL cholesterol, including ezetimibe, PCSK-9 inhibitors, bempedoic acid and inclisiran.  Discussed mechanisms of action, dosing, side effects, potential decreases in LDL cholesterol and costs.  Also reviewed potential options for patient assistance.  Plan: Patient agreeable to starting Repatha 140 mg Repeat labs after:  3 months Lipid Liver function Patient was given information on [employer] - will sign patient up when PA approved Marital status - widowed Income < $72,000 (single) or < $102,000 (married) - yes

## 2022-09-10 NOTE — Progress Notes (Signed)
Office Visit Note  Patient: [name]             Date of Birth: [birth year]           MRN: [id number]             PCP: [name] Devon, MD Referring: [name] Devon, MD Visit Date: 09/19/2022 Occupation: @GUAROCC @  Subjective:  Medication management  History of Present Illness: [name] is a 77 y.o. female with seronegative rheumatoid arthritis, discoid lupus and osteoarthritis.   She is planning to have ganglion cyst and mucinous cyst removed by [name] in July.  She continues to have some discomfort in her right CMC joint.  She states she has been having pain and discomfort in her bilateral feet which ascends to her bilateral lower extremities at nighttime.  She states the pain is so severe that she has to get up and walk around.  The pain improves after walking.  Patient states that she has been feeling weakness of the right lower extremity.  She has difficulty walking.  She was evaluated by [name] and had MRI of the lumbar spine.  She has not had recurrence of rash from discoid lupus.  She is followed by dermatologist.  Patient states that she had colonoscopy and had polypectomy.  She states that diarrhea has improved.  He had a stent placement by the cardiologist about a year ago.    Activities of Daily Living:  Patient reports morning stiffness for 30 minutes.   Patient Reports nocturnal pain.  Difficulty dressing/grooming: Reports Difficulty climbing stairs: Reports Difficulty getting out of chair: Reports Difficulty using hands for taps, buttons, cutlery, and/or writing: Reports  Review of Systems  Constitutional:  Positive for fatigue.  HENT:  Positive for mouth dryness. Negative for mouth sores.   Eyes:  Positive for dryness.  Respiratory:  Negative for shortness of breath.   Cardiovascular:  Negative for chest pain and palpitations.  Gastrointestinal:  Positive for diarrhea. Negative for blood in stool and constipation.  Endocrine:  Positive for increased urination.  Genitourinary:  Positive for involuntary urination.  Musculoskeletal:  Positive for joint pain, gait problem, joint pain, myalgias, muscle weakness, morning stiffness and myalgias. Negative for joint swelling and muscle tenderness.  Skin:  Positive for rash, hair loss and sensitivity to sunlight. Negative for color change.  Allergic/Immunologic: Positive for susceptible to infections.  Neurological:  Negative for dizziness and headaches.  Hematological:  Negative for swollen glands.  Psychiatric/Behavioral:  Negative for depressed mood and sleep disturbance. The patient is not nervous/anxious.     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   Gastroesophageal reflux disease without esophagitis 11/27/2019   Generalized anxiety disorder 11/27/2019   Hypothyroidism 11/27/2019   Smoker 11/27/2019   Type 2 diabetes mellitus without complication (HCC) 11/27/2019   Varicose veins of lower extremity 11/27/2019   Vitamin D deficiency 11/27/2019   Arthritis of hand 11/05/2019   Inflammation of joint of both hands 11/05/2019   Encounter for orthopedic follow-up care 01/09/2019   Tenosynovitis of fingers 09/22/2018   Tenosynovitis of right wrist 09/22/2018   Pain in right hand 09/08/2018   Nonintractable headache 08/16/2016   Carotid artery tenderness 10/26/2015  Leg pain, left 07/27/2014   Back pain of thoracolumbar region 08/11/2012   Renal cyst 08/04/2012   Cervical spondylosis with radiculopathy 01/24/2012   Chronic interstitial cystitis 11/01/2011   Mixed incontinence 11/01/2011   EMPHYSEMA 08/05/2008   LEUKOCYTOSIS 06/25/2008   COPD 06/25/2008   GASTROPARESIS  06/25/2008   DM 06/14/2008   Hyperlipidemia LDL goal <70 06/14/2008   Essential hypertension 06/14/2008   ASTHMATIC BRONCHITIS, ACUTE 06/14/2008   ALLERGIC RHINITIS 06/14/2008   LUPUS 06/14/2008    Past Medical History:  Diagnosis Date   Arthritis    "little bit qwhere" (01/24/2012)   Asthma    Chronic back pain    "neck to tailbone" (01/24/2012)   COPD (chronic obstructive pulmonary disease) (HCC)    Depression    takes Zoloft daily   Fibromyalgia    History of bladder infections    sees a urologist--on long term Macrodantin nightly   History of blood transfusion    "when I was a child" (01/24/2012)   Hyperlipidemia    takes Simvastatin daily   Hypertension    takes Maxzide daily   Hypothyroidism    takes Synthroid daily   Insomnia    takes Ambien nightly   Lupus (HCC)    Neck pain    herniated disc and radiculopathy   Peripheral neuropathy    Pneumonia 03/05/2010   "have had it a couple times" (01/24/2012)   Type II diabetes mellitus (HCC)    takes Janumet daily    Family History  Problem Relation Age of Onset   Heart disease Mother 36       Congestive HF, CABG   Arrhythmia Father    Diabetes Sister    Heart disease Sister    Blindness Brother    Heart attack Brother    Diabetes Brother    Past Surgical History:  Procedure Laterality Date   ANTERIOR CERVICAL DECOMP/DISCECTOMY FUSION  2002; 01/24/2012   ANTERIOR CERVICAL DECOMP/DISCECTOMY FUSION  01/24/2012   Procedure: ANTERIOR CERVICAL DECOMPRESSION/DISCECTOMY FUSION 1 LEVEL;  Surgeon: [name] Loron, MD;  Location: [facility];  Service: Neurosurgery;  Laterality: N/A;  CERVICAL THREE-FOUR anterior cervical decompression with fusion interbody prothesis plating and bonegraft   APPENDECTOMY  11/04/1958   BACK SURGERY  03/06/1999   BLADDER SURGERY  03/06/1975   through abdomen   BLADDER SUSPENSION  03/05/1972   BUNIONECTOMY  11/04/1978   left   CARDIAC CATHETERIZATION  03/05/2006   CATARACT  EXTRACTION     COLONOSCOPY     CORONARY STENT INTERVENTION N/A 09/01/2021   Procedure: CORONARY STENT INTERVENTION;  Surgeon: [name] Hazel, MD;  Location: [facility];  Service: Cardiovascular;  Laterality: N/A;   ESOPHAGOGASTRODUODENOSCOPY     LEFT HEART CATH AND CORONARY ANGIOGRAPHY N/A 09/01/2021   Procedure: LEFT HEART CATH AND CORONARY ANGIOGRAPHY;  Surgeon: [name] Hazel, MD;  Location: [facility];  Service: Cardiovascular;  Laterality: N/A;   LUMBAR FUSION  03/06/2007   MOUTH SURGERY     growth removed inside mouth per patient   RECTOCELE REPAIR  03/05/1998   VAGINAL HYSTERECTOMY  03/05/1972   Social History   Social History Narrative   Right Handed    Lives in a one story home    Immunization History  Administered Date(s) Administered   Influenza Split 01/25/2012   Influenza, High Dose Seasonal PF 01/01/2017   Influenza-Unspecified 12/03/2012   Pneumococcal Conjugate-13 12/17/2017   Pneumococcal Polysaccharide-23 01/25/2012   Tdap 04/14/2014  Objective: Vital Signs: BP (!) 147/77 (BP Location: Left Arm, Patient Position: Sitting, Cuff Size: Normal)   Pulse 61   Resp 16   Ht 5\' 5"  (1.651 m)   Wt 121 lb (54.9 kg)   BMI 20.14 kg/m    Physical Exam Vitals and nursing note reviewed.  Constitutional:      Appearance: She is well-developed.  HENT:     Head: Normocephalic and atraumatic.  Eyes:     Conjunctiva/sclera: Conjunctivae normal.  Cardiovascular:     Rate and Rhythm: Normal rate and regular rhythm.     Heart sounds: Normal heart sounds.  Pulmonary:     Effort: Pulmonary effort is normal.     Breath sounds: Normal breath sounds.  Abdominal:     General: Bowel sounds are normal.     Palpations: Abdomen is soft.  Musculoskeletal:     Cervical back: Normal range of motion.  Lymphadenopathy:     Cervical: No cervical adenopathy.  Skin:    General: Skin is warm and dry.     Capillary Refill: Capillary refill takes  less than 2 seconds.  Neurological:     Mental Status: She is alert and oriented to person, place, and time.  Psychiatric:        Behavior: Behavior normal.      Musculoskeletal Exam: She had limited lateral rotation of the cervical spine.  She had painful limited range of motion of her lumbar spine.  Shoulder joints, elbow joints, wrist joints were in good range of motion.  She had tenderness over right CMC joint with no swelling.  No synovitis was noted over MCPs PIPs or DIPs.  Ganglion cyst was noted over the volar aspect of her left wrist.  Mucinous cyst was noted over her left ring finger.  Hip joints and knee joints in good range of motion.  She had bilateral dorsal spurs send PIP and DIP thickening.  No synovitis was noted.  CDAI Exam: CDAI Score: -- Patient Global: 2 / 100; Provider Global: 2 / 100 Swollen: --; Tender: -- Joint Exam 09/19/2022   No joint exam has been documented for this visit   There is currently no information documented on the homunculus. Go to the Rheumatology activity and complete the homunculus joint exam.  Investigation: No additional findings.  Imaging: No results found.  Recent Labs: Lab Results  Component Value Date   WBC 14.7 (H) 05/23/2022   HGB 13.9 05/23/2022   PLT 264 05/23/2022   NA 136 05/23/2022   K 3.2 (L) 05/23/2022   CL 97 (L) 05/23/2022   CO2 28 05/23/2022   GLUCOSE 131 (H) 05/23/2022   BUN 22 05/23/2022   CREATININE 1.11 (H) 05/23/2022   BILITOT 0.5 04/06/2022   ALKPHOS 71 09/17/2020   AST 13 04/06/2022   ALT 8 04/06/2022   PROT 7.4 04/06/2022   ALBUMIN 3.9 09/17/2020   CALCIUM 9.2 05/23/2022   GFRAA 52 (L) 06/16/2020   QFTBGOLDPLUS NEGATIVE 08/02/2021    Speciality Comments: No specialty comments available.  Procedures:  No procedures performed Allergies: Erythromycin, Penicillins, Gadolinium derivatives, Iodinated contrast media, Latex, Morphine, Other, Prednisone, Sulfonamide derivatives, Varenicline tartrate,  Isosorbide, Aspirin, and Atorvastatin   Assessment / Plan:     Visit Diagnoses: Seronegative rheumatoid arthritis (HCC)-she continues to have some discomfort in her hands and her feet.  She denies any history of joint swelling.  No synovitis was noted.  She has been taking Enbrel 50 mg subcu weekly without any interruption.  High  risk medication use - Enbrel 50 mg sq injections once weekly-started on 11/05/19.  (D/c MTX 0.4 ml sq wkly inj-low dose- elevated creatinine and recurrent bronchitis). -CBC and CMP were obtained in February which showed mildly elevated creatinine and white cell count.  Patient has been noncompliant with the labs.  Getting labs every 3 months was emphasized.  TB Gold was negative on Aug 02, 2021.  Will check CBC, CMP and TB Gold today.  Plan: CBC with Differential/Platelet, COMPLETE METABOLIC PANEL WITH GFR, QuantiFERON-TB Gold Plus.  Information on immunization was placed in the AVS.  She was advised to hold Enbrel if she develops an infection resume after the infection resolves.  His skin examination was advised to annually to screen for skin cancer.  Use of sunscreen and sun protection was discussed.  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  She is followed by dermatologist.  Digital mucinous cyst -patient states that her surgery is a scheduled later this month.  She was advised to stop Enbrel 1 month prior to the surgery and resume 2 weeks after the surgery if there is no infection.  Risk of having a rheumatoid arthritis flare were discussed.  Ganglion cyst-over the volar aspect of her left wrist.  She will be having surgery by [name] this month.  DDD (degenerative disc disease), cervical-she had limited range of motion without discomfort.  DDD (degenerative disc disease), lumbar-she had MRI by [name] and the results are pending.  She has been experiencing right lower extremity weakness and instability.  Fibromyalgia-she continues to have some  generalized pain and discomfort.  History of type 2 diabetes mellitus  History of diabetic gastroparesis  Chronic diarrhea-improved per patient.  Patient states she had colonoscopy and polypectomy.  History of COPD  History of asthma  Neuropathy-she complains of discomfort in the bilateral feet at night which gets worse.  She states the pain gets better after moving around.  Dorsal spurs and PIP and DIP thickening consistent with osteoarthritis was noted.  No synovitis was noted.  Essential hypertension-blood pressure was elevated at 147/77.  Repeat blood pressure was 139/74.  She was advised to monitor blood pressure closely and follow-up with the PCP.  History of hyperlipidemia  History of hypothyroidism  History of depression - She recently lost her husband in November.  Her son is suffering from metastatic lung cancer and her daughter has been recently diagnosed with breast cancer.  Smoker  B12 deficiency  Orders: Orders Placed This Encounter  Procedures   CBC with Differential/Platelet   COMPLETE METABOLIC PANEL WITH GFR   QuantiFERON-TB Gold Plus   No orders of the defined types were placed in this encounter.    Follow-Up Instructions: Return in about 5 months (around 02/19/2023) for Rheumatoid arthritis.   Pollyann Savoy, MD  Note - This record has been created using Animal nutritionist.  Chart creation errors have been sought, but may not always  have been located. Such creation errors do not reflect on  the standard of medical care.

## 2022-09-13 ENCOUNTER — Ambulatory Visit (HOSPITAL_COMMUNITY)
Admission: RE | Admit: 2022-09-13 | Discharge: 2022-09-13 | Disposition: A | Discharge status: Home / Self Care | Payer: Medicare Other | Source: Ambulatory Visit | Attending: Neurosurgery | Admitting: Neurosurgery

## 2022-09-13 DIAGNOSIS — M5441 Lumbago with sciatica, right side: Secondary | ICD-10-CM | POA: Insufficient documentation

## 2022-09-14 ENCOUNTER — Telehealth: Payer: Self-pay

## 2022-09-14 ENCOUNTER — Telehealth: Payer: Self-pay | Admitting: *Deleted

## 2022-09-14 ENCOUNTER — Other Ambulatory Visit (HOSPITAL_COMMUNITY): Payer: Self-pay

## 2022-09-14 ENCOUNTER — Telehealth: Payer: Self-pay | Admitting: Pharmacist Clinician (PhC)/ Clinical Pharmacy Specialist

## 2022-09-14 NOTE — Telephone Encounter (Signed)
Pharmacy Patient Advocate Encounter   Received notification from [facility] that prior authorization for REPATHA is required/requested.   Insurance verification completed.   The patient is insured through [facility] .       PA submitted to [facility] via CoverMyMeds Key/confirmation #/EOC [id number]                                                                                                                   Status is pending

## 2022-09-14 NOTE — Telephone Encounter (Signed)
   Pre-operative Risk Assessment    Patient Name: [name]  DOB: 1945/09/13 MRN: [id number]      Request for Surgical Clearance    Procedure:   EXCISION LEFT VOLAR WRIST MASS WITH RADICAL ARTERY DISSECTION AND ARTHROTOMY SYNOVECTOMY. EXCISION LEFT RING FINGER DORSAL MASS WITH ARTHROTOMY SYNOVECTOMY    Date of Surgery:  Clearance 10/02/22                                 Surgeon:  DR. Dominica Severin Surgeon's Group or Practice Name:  [facility] Phone number:  [phone] Fax number:  [phone] ATTN: Cordelia Pen WILLS   Type of Clearance Requested:   - Medical ; ASA    Type of Anesthesia:   ANESTHESIA BLOCK   Additional requests/questions:    Elpidio Anis   09/14/2022, 9:37 AM

## 2022-09-14 NOTE — Telephone Encounter (Signed)
Labs have been received from PCP and sent to be scanned in Epic.  Drawn 08/14/2022  Total cholesterol - 208 Triglycerides - 188 HDL  - 46 LDL   - 129   Please do PA for Repatha.

## 2022-09-14 NOTE — Telephone Encounter (Addendum)
   Patient Name: [name]  DOB: 01-26-1946 MRN: [id number]  Primary Cardiologist: Rollene Rotunda, MD  Chart reviewed as part of pre-operative protocol coverage. Pre-op clearance already addressed by colleagues in earlier phone notes. To summarize recommendations:  -Preop: The patient is going to have surgery as above.  She has no high risk cardiovascular findings.  She is physically active.  It is not a high risk procedure from a cardiovascular standpoint.  No further testing is indicated and based on ACC/AHA guidelines she is at acceptable risk for the planned procedure.   -Dr. Antoine Poche   Can hold ASA x 5 to 7 days prior to procedure.  Please restart when medically safe to do so.  Will route this bundled recommendation to requesting provider via Epic fax function and remove from pre-op pool. Please call with questions.  Sharlene Dory, PA-C 09/14/2022, 11:26 AM

## 2022-09-17 MED ORDER — REPATHA SURECLICK 140 MG/ML ~~LOC~~ SOAJ: 140.0000 mg | SUBCUTANEOUS | 3 refills | Status: DC

## 2022-09-17 NOTE — Telephone Encounter (Signed)
Pharmacy Patient Advocate Encounter  Received notification from [facility] that Prior Authorization for REPATHA  has been APPROVED from 7.12.24 to 1.12.25.Marland Kitchen  PA #/Case ID/Reference #: [id number]

## 2022-09-17 NOTE — Addendum Note (Signed)
Addended by: Rosalee Kaufman on: 09/17/2022 02:43 PM   Modules accepted: Orders

## 2022-09-19 ENCOUNTER — Encounter: Payer: Self-pay | Admitting: Rheumatology

## 2022-09-19 ENCOUNTER — Ambulatory Visit: Payer: Medicare Other | Attending: Rheumatology | Admitting: Rheumatology

## 2022-09-19 VITALS — BP 139/74 | HR 60 | Resp 16 | Ht 65.0 in | Wt 121.0 lb

## 2022-09-19 DIAGNOSIS — Z79899 Other long term (current) drug therapy: Secondary | ICD-10-CM

## 2022-09-19 DIAGNOSIS — M6740 Ganglion, unspecified site: Secondary | ICD-10-CM

## 2022-09-19 DIAGNOSIS — Z8659 Personal history of other mental and behavioral disorders: Secondary | ICD-10-CM

## 2022-09-19 DIAGNOSIS — L930 Discoid lupus erythematosus: Secondary | ICD-10-CM

## 2022-09-19 DIAGNOSIS — M51369 Other intervertebral disc degeneration, lumbar region without mention of lumbar back pain or lower extremity pain: Secondary | ICD-10-CM

## 2022-09-19 DIAGNOSIS — Z8639 Personal history of other endocrine, nutritional and metabolic disease: Secondary | ICD-10-CM

## 2022-09-19 DIAGNOSIS — M67449 Ganglion, unspecified hand: Secondary | ICD-10-CM | POA: Diagnosis not present

## 2022-09-19 DIAGNOSIS — M5030 Other cervical disc degeneration, unspecified cervical region: Secondary | ICD-10-CM

## 2022-09-19 DIAGNOSIS — K529 Noninfective gastroenteritis and colitis, unspecified: Secondary | ICD-10-CM

## 2022-09-19 DIAGNOSIS — M0600 Rheumatoid arthritis without rheumatoid factor, unspecified site: Secondary | ICD-10-CM | POA: Diagnosis not present

## 2022-09-19 DIAGNOSIS — E538 Deficiency of other specified B group vitamins: Secondary | ICD-10-CM

## 2022-09-19 DIAGNOSIS — F17200 Nicotine dependence, unspecified, uncomplicated: Secondary | ICD-10-CM

## 2022-09-19 DIAGNOSIS — Z8709 Personal history of other diseases of the respiratory system: Secondary | ICD-10-CM

## 2022-09-19 DIAGNOSIS — M797 Fibromyalgia: Secondary | ICD-10-CM

## 2022-09-19 DIAGNOSIS — M5136 Other intervertebral disc degeneration, lumbar region: Secondary | ICD-10-CM

## 2022-09-19 DIAGNOSIS — I10 Essential (primary) hypertension: Secondary | ICD-10-CM

## 2022-09-19 DIAGNOSIS — G629 Polyneuropathy, unspecified: Secondary | ICD-10-CM

## 2022-09-19 NOTE — Patient Instructions (Signed)
Standing Labs We placed an order today for your standing lab work.   Please have your standing labs drawn in October and every 3 months  Please have your labs drawn 2 weeks prior to your appointment so that the provider can discuss your lab results at your appointment, if possible.  Please note that you may see your imaging and lab results in MyChart before we have reviewed them. We will contact you once all results are reviewed. Please allow our office up to 72 hours to thoroughly review all of the results before contacting the office for clarification of your results.  WALK-IN LAB HOURS  Monday through Thursday from 8:00 am -12:30 pm and 1:00 pm-5:00 pm and Friday from 8:00 am-12:00 pm.  Patients with office visits requiring labs will be seen before walk-in labs.  You may encounter longer than normal wait times. Please allow additional time. Wait times may be shorter on  Monday and Thursday afternoons.  We do not book appointments for walk-in labs. We appreciate your patience and understanding with our staff.   Labs are drawn by [facility]. Please bring your co-pay at the time of your lab draw.  You may receive a bill from [facility] for your lab work.  Please note if you are on Hydroxychloroquine and and an order has been placed for a Hydroxychloroquine level,  you will need to have it drawn 4 hours or more after your last dose.  If you wish to have your labs drawn at another location, please call the office 24 hours in advance so we can fax the orders.  The office is located at [address], [place], [name] [address]   If you have any questions regarding directions or hours of operation,  please call [phone].   As a reminder, please drink plenty of water prior to coming for your lab work. Thanks!  Vaccines You are taking a medication(s) that can suppress your immune system.  The following immunizations are recommended: Flu annually Covid-19  Td/Tdap (tetanus,  diphtheria, pertussis) every 10 years Pneumonia (Prevnar 15 then Pneumovax 23 at least 1 year apart.  Alternatively, can take Prevnar 20 without needing additional dose) Shingrix: 2 doses from 4 weeks to 6 months apart  Please check with your PCP to make sure you are up to date.   If you have signs or symptoms of an infection or start antibiotics: First, call your PCP for workup of your infection. Hold your medication through the infection, until you complete your antibiotics, and until symptoms resolve if you take the following: Injectable medication (Actemra, Benlysta, Cimzia, Cosentyx, Enbrel, Humira, Kevzara, Orencia, Remicade, Simponi, Stelara, Taltz, Tremfya) Methotrexate Leflunomide (Arava) Mycophenolate (Cellcept) Harriette Ohara, Olumiant, or Rinvoq   Please a stop Enbrel 1 week prior to the surgery and resume 2 weeks after the surgery if there is no infection and you have clearance by the surgeon.  Please get an annual skin examination to screen for skin cancer while you are on Enbrel.  Please use sunscreen and sun protection

## 2022-09-20 LAB — COMPLETE METABOLIC PANEL WITH GFR
ALT: 9 U/L (ref 6–29)
Alkaline phosphatase (APISO): 63 U/L (ref 37–153)
CO2: 27 mmol/L (ref 20–32)
Calcium: 9.2 mg/dL (ref 8.6–10.4)
Chloride: 103 mmol/L (ref 98–110)
Glucose, Bld: 55 mg/dL — ABNORMAL LOW (ref 65–99)
Total Bilirubin: 0.4 mg/dL (ref 0.2–1.2)
Total Protein: 7 g/dL (ref 6.1–8.1)
eGFR: 58 mL/min/{1.73_m2} — ABNORMAL LOW (ref >=60)

## 2022-09-20 LAB — CBC WITH DIFFERENTIAL/PLATELET
Absolute Monocytes: 771 cells/uL (ref 200–950)
Eosinophils Relative: 14.7 %
Lymphs Abs: 1748 cells/uL (ref 850–3900)
MCV: 91.6 fL (ref 80.0–100.0)
MPV: 9.8 fL (ref 7.5–12.5)
Platelets: 224 10*3/uL (ref 140–400)
RDW: 14 % (ref 11.0–15.0)
WBC: 11.5 10*3/uL — ABNORMAL HIGH (ref 3.8–10.8)

## 2022-09-20 NOTE — Progress Notes (Signed)
White cell count is elevated and stable GFR is low and stable.

## 2022-09-22 LAB — COMPLETE METABOLIC PANEL WITH GFR
AG Ratio: 1.4 (calc) (ref 1.0–2.5)
AST: 12 U/L (ref 10–35)
Albumin: 4.1 g/dL (ref 3.6–5.1)
BUN: 14 mg/dL (ref 7–25)
Creat: 1 mg/dL (ref 0.60–1.00)
Globulin: 2.9 g/dL (calc) (ref 1.9–3.7)
Potassium: 4 mmol/L (ref 3.5–5.3)
Sodium: 139 mmol/L (ref 135–146)

## 2022-09-22 LAB — CBC WITH DIFFERENTIAL/PLATELET
Basophils Absolute: 173 cells/uL (ref 0–200)
Basophils Relative: 1.5 %
Eosinophils Absolute: 1691 cells/uL — ABNORMAL HIGH (ref 15–500)
HCT: 42.5 % (ref 35.0–45.0)
Hemoglobin: 14 g/dL (ref 11.7–15.5)
MCH: 30.2 pg (ref 27.0–33.0)
MCHC: 32.9 g/dL (ref 32.0–36.0)
Monocytes Relative: 6.7 %
Neutro Abs: 7119 cells/uL (ref 1500–7800)
Neutrophils Relative %: 61.9 %
RBC: 4.64 10*6/uL (ref 3.80–5.10)
Total Lymphocyte: 15.2 %

## 2022-09-22 LAB — QUANTIFERON-TB GOLD PLUS
Mitogen-NIL: 8.47 IU/mL
NIL: 0.02 IU/mL
QuantiFERON-TB Gold Plus: NEGATIVE
TB1-NIL: 0.01 IU/mL
TB2-NIL: 0.02 IU/mL

## 2022-09-22 NOTE — Progress Notes (Signed)
TB Gold is negative.

## 2022-09-24 NOTE — Progress Notes (Signed)
We have noted elevated eosinophil count and will continue to follow it.  Eosinophils are usually high due to allergic response.

## 2022-10-16 NOTE — Progress Notes (Shared)
[facility] - Clinic Note  10/30/2022     CHIEF COMPLAINT Patient presents for Retina Follow Up   HISTORY OF PRESENT ILLNESS: [name] is a 77 y.o. female who presents to the clinic today for:   HPI     Retina Follow Up   Patient presents with  Diabetic Retinopathy.  This started months ago.  Duration of 9 months.  I, the attending physician,  performed the HPI with the patient and updated documentation appropriately.        Comments   Patient states that there has been no changes in her vision. She is seeing a floater in both eyes. She is using AT's PRN. Her blood sugar was 120.      Last edited by Rennis Chris, MD on 11/01/2022  2:03 AM.     Pt states vision is stable, she states she has a floater in both eyes, but has not seen any fol   Referring physician: Andi Devon, MD [address] [place],  [name] [address]  HISTORICAL INFORMATION:   Selected notes from the MEDICAL RECORD NUMBER Self referral for DEE -- pt of Dr. Dione Booze [name]:  Ocular Hx- PMH-    CURRENT MEDICATIONS: No current outpatient medications on file. (Ophthalmic Drugs)   No current facility-administered medications for this visit. (Ophthalmic Drugs)   Current Outpatient Medications (Other)  Medication Sig   albuterol (VENTOLIN HFA) 108 (90 Base) MCG/ACT inhaler Inhale 2 puffs into the lungs every 8 (eight) hours as needed (asthma / chest congestion).   ALPRAZolam (XANAX) 0.25 MG tablet Take 0.25 mg by mouth 2 (two) times daily as needed for anxiety.   aspirin EC 81 MG tablet Take 1 tablet (81 mg total) by mouth daily. Swallow whole.   BREZTRI AEROSPHERE 160-9-4.8 MCG/ACT AERO Inhale 2 puffs into the lungs 2 (two) times daily.   donepezil (ARICEPT) 5 MG tablet Take 5 mg by mouth at bedtime.   Etanercept (ENBREL MINI) 50 MG/ML SOCT Inject 50 mg into the skin once a week.   HYDROcodone-acetaminophen (NORCO/VICODIN) 5-325 MG tablet Take by mouth.    insulin glargine, 1 Unit Dial, (TOUJEO SOLOSTAR) 300 UNIT/ML Solostar Pen Inject 0-50 Units into the skin See admin instructions. Sliding scale  Under 120=0 Over 120= 10-20 units in the morning Bedtime 20 units if needed, can take up to 50 units depending in Blood glucose  35 units in the morning and 50 units at bedtime.   levothyroxine (SYNTHROID, LEVOTHROID) 50 MCG tablet Take 50 mcg by mouth daily before breakfast.   metFORMIN (GLUCOPHAGE) 1000 MG tablet Take 1 tablet (1,000 mg total) by mouth daily with breakfast.   nitroGLYCERIN (NITROSTAT) 0.4 MG SL tablet Place 1 tablet (0.4 mg total) under the tongue every 5 (five) minutes x 3 doses as needed for chest pain. If no relief after 3rd dose, proceed to the ED for an evaluation   ONETOUCH VERIO test strip 1 each 4 (four) times daily.   sertraline (ZOLOFT) 100 MG tablet Take 100 mg by mouth daily.    triamterene-hydrochlorothiazide (MAXZIDE-25) 37.5-25 MG tablet Take 1 tablet by mouth daily.   VITAMIN D PO Take 5,000 Units by mouth daily.   zolpidem (AMBIEN) 5 MG tablet Take 5 mg by mouth at bedtime. For sleep   Evolocumab (REPATHA SURECLICK) 140 MG/ML SOAJ Inject 140 mg into the skin every 14 (fourteen) days.   No current facility-administered medications for this visit. (Other)   REVIEW OF SYSTEMS:  ROS   Positive for: Eyes Negative for: Constitutional, Gastrointestinal, Neurological, Skin, Genitourinary, Musculoskeletal, HENT, Endocrine, Cardiovascular, Respiratory, Psychiatric, Allergic/Imm, Heme/Lymph Last edited by Charlette Caffey, COT on 10/30/2022  1:02 PM.      ALLERGIES Allergies  Allergen Reactions   Erythromycin Anaphylaxis   Penicillins Anaphylaxis   Gadolinium Derivatives Hives and Rash    Pt broke out in hives all over body.  Has IV steroids and Benadryl in hospital for MRI.   Iodinated Contrast Media Hives    13-hour prep Pt had slight itching of her abd after her injection and had taken her 13 hr prep.  Consult  with radiologist before another procedure to see if IV contrast is necessary, per Dr Roswell Nickel on 01/06/14, JB/   Latex Dermatitis and Other (See Comments)    blisters   Morphine Nausea And Vomiting   Other Itching and Other (See Comments)    Chlorhexin (CHG); "burning"   Prednisone Other (See Comments)    "shakes me out of my frame" IM steroids aren't as bad as PO   Sulfonamide Derivatives Itching and Rash    All over body    Varenicline Tartrate Nausea And Vomiting        Isosorbide Nausea Only    "Short of breath and extremely tired and week also"   Aspirin Nausea Only and Other (See Comments)    Burning in stomach    Atorvastatin Other (See Comments)    System will not process   PAST MEDICAL HISTORY Past Medical History:  Diagnosis Date   Arthritis    "little bit qwhere" (01/24/2012)   Asthma    Chronic back pain    "neck to tailbone" (01/24/2012)   COPD (chronic obstructive pulmonary disease) (HCC)    Depression    takes Zoloft daily   Fibromyalgia    History of bladder infections    sees a urologist--on long term Macrodantin nightly   History of blood transfusion    "when I was a child" (01/24/2012)   Hyperlipidemia    takes Simvastatin daily   Hypertension    takes Maxzide daily   Hypothyroidism    takes Synthroid daily   Insomnia    takes Ambien nightly   Lupus (HCC)    Neck pain    herniated disc and radiculopathy   Peripheral neuropathy    Pneumonia 03/05/2010   "have had it a couple times" (01/24/2012)   Type II diabetes mellitus (HCC)    takes Janumet daily   Past Surgical History:  Procedure Laterality Date   ANTERIOR CERVICAL DECOMP/DISCECTOMY FUSION  2002; 01/24/2012   ANTERIOR CERVICAL DECOMP/DISCECTOMY FUSION  01/24/2012   Procedure: ANTERIOR CERVICAL DECOMPRESSION/DISCECTOMY FUSION 1 LEVEL;  Surgeon: Cristi Loron, MD;  Location: [facility];  Service: Neurosurgery;  Laterality: N/A;  CERVICAL THREE-FOUR anterior cervical decompression  with fusion interbody prothesis plating and bonegraft   APPENDECTOMY  11/04/1958   BACK SURGERY  03/06/1999   BLADDER SURGERY  03/06/1975   through abdomen   BLADDER SUSPENSION  03/05/1972   BUNIONECTOMY  11/04/1978   left   CARDIAC CATHETERIZATION  03/05/2006   CATARACT EXTRACTION     COLONOSCOPY     CORONARY STENT INTERVENTION N/A 09/01/2021   Procedure: CORONARY STENT INTERVENTION;  Surgeon: Kathleene Hazel, MD;  Location: [facility] INVASIVE CV LAB;  Service: Cardiovascular;  Laterality: N/A;   ESOPHAGOGASTRODUODENOSCOPY     LEFT HEART CATH AND CORONARY ANGIOGRAPHY N/A 09/01/2021   Procedure: LEFT HEART CATH AND CORONARY ANGIOGRAPHY;  Surgeon: Kathleene Hazel, MD;  Location: [facility] INVASIVE CV LAB;  Service: Cardiovascular;  Laterality: N/A;   LUMBAR FUSION  03/06/2007   MOUTH SURGERY     growth removed inside mouth per patient   RECTOCELE REPAIR  03/05/1998   VAGINAL HYSTERECTOMY  03/05/1972   FAMILY HISTORY Family History  Problem Relation Age of Onset   Heart disease Mother 72       Congestive HF, CABG   Arrhythmia Father    Diabetes Sister    Heart disease Sister    Blindness Brother    Heart attack Brother    Diabetes Brother    SOCIAL HISTORY Social History   Tobacco Use   Smoking status: Every Day    Current packs/day: 1.00    Average packs/day: 1 pack/day for 60.0 years (60.0 ttl pk-yrs)    Types: Cigarettes    Passive exposure: Never   Smokeless tobacco: Never  Vaping Use   Vaping status: Former  Substance Use Topics   Alcohol use: No   Drug use: No       OPHTHALMIC EXAM: Base Eye Exam     Visual Acuity (Snellen - Linear)       Right Left   Dist cc 20/30 20/30   Dist ph cc NI NI         Tonometry (Tonopen, 1:06 PM)       Right Left   Pressure 10 12         Pupils       Dark Light Shape React APD   Right 2 1 Round Brisk None   Left 2 1 Round Brisk None         Visual Fields       Left Right    Full Full          Extraocular Movement       Right Left    Full, Ortho Full, Ortho         Neuro/Psych     Oriented x3: Yes   Mood/Affect: Normal         Dilation     Both eyes: 2.5% Phenylephrine, 1.0% Mydriacyl @ 1:03 PM           Slit Lamp and Fundus Exam     Slit Lamp Exam       Right Left   Lids/Lashes Dermatochalasis - upper lid, Meibomian gland dysfunction Dermatochalasis - upper lid   Conjunctiva/Sclera White and quiet White and quiet   Cornea arcus, trace PEE inferiorly, well healed cataract wound arcus, 1+PEE inferiorly, well healed cataract wound   Anterior Chamber deep and clear, no cell or flare deep and clear, no cell or flare   Iris round and poorly dilated Round and dilated   Lens PC IOL in good position, 1+ Posterior capsular opacification, iridodonesis PC IOL in good position, trace Posterior capsular opacification   Anterior Vitreous Vitreous syneresis, no cell or pigment, Posterior vitreous detachment, Weiss ring Vitreous syneresis, no cell or pigment, Posterior vitreous detachment         Fundus Exam       Right Left   Disc mild Pallor, Sharp rim mild Pallor, Sharp rim, +SVP   C/D Ratio 0.3 0.4   Macula Flat, Good foveal reflex, Drusen, mild RPE mottling, No heme or edema Flat, Good foveal reflex, Drusen, RPE mottling and clumping, central vitelliform like lesion, No heme or edema, early atrophy   Vessels attenuated, Tortuous attenuated, Tortuous   Periphery Attached,  rare peripheral DBH, No RT/RD Attached, No heme, No RT/RD            IMAGING AND PROCEDURES  Imaging and Procedures for 10/30/2022  OCT, Retina - OU - Both Eyes       Right Eye Quality was good. Central Foveal Thickness: 283. Progression has been stable. Findings include normal foveal contour, no IRF, no SRF, retinal drusen (stable release of VMA to full PVD, trace vitreous opacities -- improved, trace ERM).   Left Eye Quality was good. Central Foveal Thickness: 273. Progression has  been stable. Findings include normal foveal contour, no IRF, no SRF, retinal drusen , subretinal hyper-reflective material (Interval release of VMA to full PVD, Mild central SRHM / vitelliform like lesion, no DME).   Notes *Images captured and stored on drive  Diagnosis / Impression:  NFP; no IRF/SRF OU No DME OU OD: stable release of VMA to full PVD, trace vitreous opacities -- improved, trace ERM OS: Interval release of VMA to full PVD, Mild central SRHM / vitelliform like lesion, no DME   Clinical management:  See below  Abbreviations: NFP - Normal foveal profile. CME - cystoid macular edema. PED - pigment epithelial detachment. IRF - intraretinal fluid. SRF - subretinal fluid. EZ - ellipsoid zone. ERM - epiretinal membrane. ORA - outer retinal atrophy. ORT - outer retinal tubulation. SRHM - subretinal hyper-reflective material. IRHM - intraretinal hyper-reflective material            ASSESSMENT/PLAN:    ICD-10-CM   1. Diabetes mellitus type 2 without retinopathy (HCC)  E11.9 OCT, Retina - OU - Both Eyes    2. Encounter for long-term (current) use of insulin (HCC)  Z79.4     3. Diabetes mellitus treated with oral medication (HCC)  E11.9    Z79.84     4. Intermediate stage nonexudative age-related macular degeneration of both eyes  H35.3132     5. Hypertensive retinopathy of both eyes  H35.033     6. Essential hypertension  I10     7. Posterior vitreous detachment of both eyes  H43.813     8. Pseudophakia of both eyes  Z96.1      1-3. Diabetes mellitus, type 2 without retinopathy  - exam with no MA or heme OU - The incidence, risk factors for progression, natural history and treatment options for diabetic retinopathy  were discussed with patient.   - The need for close monitoring of blood glucose, blood pressure, and serum lipids, avoiding cigarette or any type of tobacco, and the need for long term follow up was also discussed with patient. - f/u 6-9 months, sooner  PRN -- DFE/OCT  4. Age related macular degeneration, non-exudative, both eyes  - intermediate stage with near confluent drusen and early atrophy OU -- stable  - no exudative disease on exam or OCT OU  - discussed ARMD as the most likely cause of visual symptoms  - The incidence, anatomy, and pathology of dry AMD, risk of progression, and the AREDS and AREDS 2 study including smoking risks discussed with patient.  - Recommend amsler grid monitoring  - f/u in 6-9 mos  5,6. Hypertensive retinopathy OU - discussed importance of tight BP control - monitor  7. PVD OU  - subacute symptomatic floaters, onset Sunday night 8.13.23  - subjective increase in floaters -- exam stable  - OCT shows interval release of VMA to full PVD OS  - Discussed findings and prognosis - No RT or RD on 360  peripheral exam OU - Reviewed s/s of RT/RD - Strict return precautions for any such RT/RD signs/symptoms - monitor  8. Pseudophakia OU  - s/p CE/IOL OU (R. Groat)  - IOLs in good position, doing well  - monitor  Ophthalmic Meds Ordered this visit:  No orders of the defined types were placed in this encounter.    Return for f/u 6-9 months, non-exu ARMD OU, DFE, OCT.  There are no Patient Instructions on file for this visit.   Explained the diagnoses, plan, and follow up with the patient and they expressed understanding.  Patient expressed understanding of the importance of proper follow up care.   This document serves as a record of services personally performed by Karie Chimera, MD, PhD. It was created on their behalf by Laurey Morale, COT an ophthalmic technician. The creation of this record is the provider's dictation and/or activities during the visit.    Electronically signed by:  Charlette Caffey, COT  11/01/22 2:08 AM  This document serves as a record of services personally performed by Karie Chimera, MD, PhD. It was created on their behalf by Glee Arvin. Manson Passey, OA an ophthalmic  technician. The creation of this record is the provider's dictation and/or activities during the visit.    Electronically signed by: Glee Arvin. Manson Passey, OA 11/01/22 2:08 AM  Karie Chimera, M.D., Ph.D. Diseases & Surgery of the Retina and Vitreous [facility]  I have reviewed the above documentation for accuracy and completeness, and I agree with the above. Karie Chimera, M.D., Ph.D. 11/01/22 2:08 AM  Abbreviations: M myopia (nearsighted); A astigmatism; H hyperopia (farsighted); P presbyopia; Mrx spectacle prescription;  CTL contact lenses; OD right eye; OS left eye; OU both eyes  XT exotropia; ET esotropia; PEK punctate epithelial keratitis; PEE punctate epithelial erosions; DES dry eye syndrome; MGD meibomian gland dysfunction; ATs artificial tears; PFAT's preservative free artificial tears; NSC nuclear sclerotic cataract; PSC posterior subcapsular cataract; ERM epi-retinal membrane; PVD posterior vitreous detachment; RD retinal detachment; DM diabetes mellitus; DR diabetic retinopathy; NPDR non-proliferative diabetic retinopathy; PDR proliferative diabetic retinopathy; CSME clinically significant macular edema; DME diabetic macular edema; dbh dot blot hemorrhages; CWS cotton wool spot; POAG primary open angle glaucoma; C/D cup-to-disc ratio; HVF humphrey visual field; GVF goldmann visual field; OCT optical coherence tomography; IOP intraocular pressure; BRVO Branch retinal vein occlusion; CRVO central retinal vein occlusion; CRAO central retinal artery occlusion; BRAO branch retinal artery occlusion; RT retinal tear; SB scleral buckle; PPV pars plana vitrectomy; VH Vitreous hemorrhage; PRP panretinal laser photocoagulation; IVK intravitreal kenalog; VMT vitreomacular traction; MH Macular hole;  NVD neovascularization of the disc; NVE neovascularization elsewhere; AREDS age related eye disease study; ARMD age related macular degeneration; POAG primary open angle glaucoma; EBMD  epithelial/anterior basement membrane dystrophy; ACIOL anterior chamber intraocular lens; IOL intraocular lens; PCIOL posterior chamber intraocular lens; Phaco/IOL phacoemulsification with intraocular lens placement; PRK photorefractive keratectomy; LASIK laser assisted in situ keratomileusis; HTN hypertension; DM diabetes mellitus; COPD chronic obstructive pulmonary disease

## 2022-10-30 ENCOUNTER — Encounter (INDEPENDENT_AMBULATORY_CARE_PROVIDER_SITE_OTHER): Payer: Self-pay | Admitting: Ophthalmology

## 2022-10-30 ENCOUNTER — Ambulatory Visit (INDEPENDENT_AMBULATORY_CARE_PROVIDER_SITE_OTHER): Payer: Medicare Other | Admitting: Ophthalmology

## 2022-10-30 DIAGNOSIS — H35033 Hypertensive retinopathy, bilateral: Secondary | ICD-10-CM

## 2022-10-30 DIAGNOSIS — E119 Type 2 diabetes mellitus without complications: Secondary | ICD-10-CM

## 2022-10-30 DIAGNOSIS — Z961 Presence of intraocular lens: Secondary | ICD-10-CM

## 2022-10-30 DIAGNOSIS — H353132 Nonexudative age-related macular degeneration, bilateral, intermediate dry stage: Secondary | ICD-10-CM

## 2022-10-30 DIAGNOSIS — H43813 Vitreous degeneration, bilateral: Secondary | ICD-10-CM | POA: Diagnosis not present

## 2022-10-30 DIAGNOSIS — H43811 Vitreous degeneration, right eye: Secondary | ICD-10-CM

## 2022-10-30 DIAGNOSIS — Z794 Long term (current) use of insulin: Secondary | ICD-10-CM

## 2022-10-30 DIAGNOSIS — I10 Essential (primary) hypertension: Secondary | ICD-10-CM

## 2022-10-30 DIAGNOSIS — Z7984 Long term (current) use of oral hypoglycemic drugs: Secondary | ICD-10-CM

## 2022-11-01 ENCOUNTER — Encounter (INDEPENDENT_AMBULATORY_CARE_PROVIDER_SITE_OTHER): Payer: Self-pay | Admitting: Ophthalmology

## 2022-11-07 ENCOUNTER — Encounter: Payer: Self-pay | Admitting: Pharmacist

## 2022-11-08 ENCOUNTER — Other Ambulatory Visit: Payer: Self-pay

## 2022-11-08 MED ORDER — REPATHA SURECLICK 140 MG/ML ~~LOC~~ SOAJ
140.0000 mg | SUBCUTANEOUS | 3 refills | Status: DC
  Filled 2022-11-08: qty 6, 84d supply, fill #0
  Filled 2023-03-14: qty 6, 84d supply, fill #1
  Filled 2023-06-05: qty 2, 28d supply, fill #2
  Filled 2023-08-20 – 2023-08-27 (×2): qty 2, 28d supply, fill #3
  Filled 2023-09-23: qty 2, 28d supply, fill #4

## 2022-11-29 ENCOUNTER — Other Ambulatory Visit: Payer: Self-pay | Admitting: *Deleted

## 2022-11-29 MED ORDER — ENBREL MINI 50 MG/ML ~~LOC~~ SOCT: 50.0000 mg | SUBCUTANEOUS | 0 refills | Status: DC

## 2022-11-29 NOTE — Telephone Encounter (Signed)
Patient left message requesting a refill on Enbrel to be sent to [facility].  Last Fill: 07/26/2022  Labs: 09/19/2022 White cell count is elevated and stable GFR is low and stable.   TB Gold: 09/19/2022 Neg    Next Visit: 02/19/2023  Last Visit: 09/19/2022  ON:[id number] rheumatoid arthritis   Current Dose per office note 09/19/2022: Enbrel 50 mg sq injections once weekly   Okay to refill Enbrel?

## 2022-12-04 DIAGNOSIS — U071 COVID-19: Secondary | ICD-10-CM

## 2022-12-04 HISTORY — DX: COVID-19: U07.1

## 2022-12-22 ENCOUNTER — Observation Stay (HOSPITAL_COMMUNITY)
Admission: EM | Admit: 2022-12-22 | Discharge: 2022-12-24 | Disposition: A | Discharge status: Home / Self Care | Payer: Medicare Other | Attending: Internal Medicine | Admitting: Internal Medicine

## 2022-12-22 ENCOUNTER — Other Ambulatory Visit: Payer: Self-pay

## 2022-12-22 ENCOUNTER — Emergency Department (HOSPITAL_COMMUNITY): Payer: Medicare Other

## 2022-12-22 ENCOUNTER — Encounter (HOSPITAL_COMMUNITY): Payer: Self-pay

## 2022-12-22 DIAGNOSIS — Z91040 Latex allergy status: Secondary | ICD-10-CM | POA: Diagnosis not present

## 2022-12-22 DIAGNOSIS — E860 Dehydration: Secondary | ICD-10-CM | POA: Diagnosis not present

## 2022-12-22 DIAGNOSIS — E871 Hypo-osmolality and hyponatremia: Secondary | ICD-10-CM | POA: Insufficient documentation

## 2022-12-22 DIAGNOSIS — F329 Major depressive disorder, single episode, unspecified: Secondary | ICD-10-CM | POA: Insufficient documentation

## 2022-12-22 DIAGNOSIS — I10 Essential (primary) hypertension: Secondary | ICD-10-CM | POA: Insufficient documentation

## 2022-12-22 DIAGNOSIS — R531 Weakness: Secondary | ICD-10-CM | POA: Diagnosis present

## 2022-12-22 DIAGNOSIS — N179 Acute kidney failure, unspecified: Secondary | ICD-10-CM | POA: Diagnosis present

## 2022-12-22 DIAGNOSIS — Z7951 Long term (current) use of inhaled steroids: Secondary | ICD-10-CM | POA: Insufficient documentation

## 2022-12-22 DIAGNOSIS — Z794 Long term (current) use of insulin: Secondary | ICD-10-CM | POA: Insufficient documentation

## 2022-12-22 DIAGNOSIS — Z7982 Long term (current) use of aspirin: Secondary | ICD-10-CM | POA: Diagnosis not present

## 2022-12-22 DIAGNOSIS — U071 COVID-19: Secondary | ICD-10-CM | POA: Diagnosis not present

## 2022-12-22 DIAGNOSIS — E1140 Type 2 diabetes mellitus with diabetic neuropathy, unspecified: Secondary | ICD-10-CM | POA: Diagnosis not present

## 2022-12-22 DIAGNOSIS — Z7984 Long term (current) use of oral hypoglycemic drugs: Secondary | ICD-10-CM | POA: Insufficient documentation

## 2022-12-22 DIAGNOSIS — J4489 Other specified chronic obstructive pulmonary disease: Secondary | ICD-10-CM | POA: Insufficient documentation

## 2022-12-22 DIAGNOSIS — E039 Hypothyroidism, unspecified: Secondary | ICD-10-CM | POA: Insufficient documentation

## 2022-12-22 DIAGNOSIS — F17210 Nicotine dependence, cigarettes, uncomplicated: Secondary | ICD-10-CM | POA: Insufficient documentation

## 2022-12-22 LAB — CBC WITH DIFFERENTIAL/PLATELET
Abs Immature Granulocytes: 0.03 10*3/uL (ref 0.00–0.07)
Basophils Absolute: 0 10*3/uL (ref 0.0–0.1)
Basophils Relative: 1 %
Eosinophils Absolute: 0.1 10*3/uL (ref 0.0–0.5)
Eosinophils Relative: 1 %
HCT: 44.3 % (ref 36.0–46.0)
Hemoglobin: 15 g/dL (ref 12.0–15.0)
Immature Granulocytes: 0 %
Lymphocytes Relative: 15 %
Lymphs Abs: 1.1 10*3/uL (ref 0.7–4.0)
MCH: 30.7 pg (ref 26.0–34.0)
MCHC: 33.9 g/dL (ref 30.0–36.0)
MCV: 90.8 fL (ref 80.0–100.0)
Monocytes Absolute: 0.5 10*3/uL (ref 0.1–1.0)
Monocytes Relative: 7 %
Neutro Abs: 5.4 10*3/uL (ref 1.7–7.7)
Neutrophils Relative %: 76 %
Platelets: 260 10*3/uL (ref 150–400)
RBC: 4.88 MIL/uL (ref 3.87–5.11)
RDW: 13.7 % (ref 11.5–15.5)
WBC: 7.2 10*3/uL (ref 4.0–10.5)
nRBC: 0 % (ref 0.0–0.2)

## 2022-12-22 LAB — COMPREHENSIVE METABOLIC PANEL
ALT: 17 U/L (ref 0–44)
AST: 20 U/L (ref 15–41)
Albumin: 3.7 g/dL (ref 3.5–5.0)
Alkaline Phosphatase: 67 U/L (ref 38–126)
Anion gap: 11 (ref 5–15)
BUN: 44 mg/dL — ABNORMAL HIGH (ref 8–23)
CO2: 25 mmol/L (ref 22–32)
Calcium: 8.2 mg/dL — ABNORMAL LOW (ref 8.9–10.3)
Chloride: 93 mmol/L — ABNORMAL LOW (ref 98–111)
Creatinine, Ser: 1.48 mg/dL — ABNORMAL HIGH (ref 0.44–1.00)
GFR, Estimated: 36 mL/min — ABNORMAL LOW (ref >60)
Glucose, Bld: 112 mg/dL — ABNORMAL HIGH (ref 70–99)
Potassium: 3.6 mmol/L (ref 3.5–5.1)
Sodium: 129 mmol/L — ABNORMAL LOW (ref 135–145)
Total Bilirubin: 0.7 mg/dL (ref 0.3–1.2)
Total Protein: 7.5 g/dL (ref 6.5–8.1)

## 2022-12-22 LAB — URINALYSIS, ROUTINE W REFLEX MICROSCOPIC
Bilirubin Urine: NEGATIVE
Glucose, UA: NEGATIVE mg/dL
Hgb urine dipstick: NEGATIVE
Ketones, ur: NEGATIVE mg/dL
Leukocytes,Ua: NEGATIVE
Nitrite: NEGATIVE
Protein, ur: NEGATIVE mg/dL
Specific Gravity, Urine: 1.018 (ref 1.005–1.030)
pH: 5 (ref 5.0–8.0)

## 2022-12-22 LAB — GLUCOSE, CAPILLARY: Glucose-Capillary: 93 mg/dL (ref 70–99)

## 2022-12-22 LAB — CBG MONITORING, ED: Glucose-Capillary: 93 mg/dL (ref 70–99)

## 2022-12-22 LAB — SARS CORONAVIRUS 2 BY RT PCR: SARS Coronavirus 2 by RT PCR: POSITIVE — AB

## 2022-12-22 MED ORDER — ZOLPIDEM TARTRATE 5 MG PO TABS
5.0000 mg | ORAL_TABLET | Freq: Every day | ORAL | Status: DC
  Administered 2022-12-22 – 2022-12-23 (×2): 5 mg via ORAL
  Filled 2022-12-22 (×2): qty 1

## 2022-12-22 MED ORDER — ACETAMINOPHEN 650 MG RE SUPP: 650.0000 mg | Freq: Four times a day (QID) | RECTAL | Status: DC | PRN

## 2022-12-22 MED ORDER — SENNOSIDES-DOCUSATE SODIUM 8.6-50 MG PO TABS: 1.0000 | ORAL_TABLET | Freq: Every evening | ORAL | Status: DC | PRN

## 2022-12-22 MED ORDER — LACTATED RINGERS IV SOLN: INTRAVENOUS | Status: AC

## 2022-12-22 MED ORDER — INSULIN ASPART 100 UNIT/ML IJ SOLN
0.0000 [IU] | Freq: Three times a day (TID) | INTRAMUSCULAR | Status: DC
  Administered 2022-12-24: 3 [IU] via SUBCUTANEOUS

## 2022-12-22 MED ORDER — INSULIN ASPART 100 UNIT/ML IJ SOLN: 0.0000 [IU] | Freq: Every day | INTRAMUSCULAR | Status: DC

## 2022-12-22 MED ORDER — ACETAMINOPHEN 325 MG PO TABS
650.0000 mg | ORAL_TABLET | Freq: Four times a day (QID) | ORAL | Status: DC | PRN
  Administered 2022-12-22 – 2022-12-23 (×2): 650 mg via ORAL
  Filled 2022-12-22 (×3): qty 2

## 2022-12-22 MED ORDER — ONDANSETRON HCL 4 MG PO TABS
4.0000 mg | ORAL_TABLET | Freq: Four times a day (QID) | ORAL | Status: DC | PRN
  Administered 2022-12-22: 4 mg via ORAL
  Filled 2022-12-22: qty 1

## 2022-12-22 MED ORDER — FLUTICASONE FUROATE-VILANTEROL 100-25 MCG/ACT IN AEPB
1.0000 | INHALATION_SPRAY | Freq: Every day | RESPIRATORY_TRACT | Status: DC
  Administered 2022-12-23 – 2022-12-24 (×2): 1 via RESPIRATORY_TRACT
  Filled 2022-12-22: qty 28

## 2022-12-22 MED ORDER — METFORMIN HCL 500 MG PO TABS
1000.0000 mg | ORAL_TABLET | Freq: Every day | ORAL | Status: DC
  Filled 2022-12-22: qty 2

## 2022-12-22 MED ORDER — ENOXAPARIN SODIUM 30 MG/0.3ML IJ SOSY
30.0000 mg | PREFILLED_SYRINGE | INTRAMUSCULAR | Status: DC
  Administered 2022-12-22 – 2022-12-23 (×2): 30 mg via SUBCUTANEOUS
  Filled 2022-12-22 (×2): qty 0.3

## 2022-12-22 MED ORDER — UMECLIDINIUM BROMIDE 62.5 MCG/ACT IN AEPB
1.0000 | INHALATION_SPRAY | Freq: Every day | RESPIRATORY_TRACT | Status: DC
  Administered 2022-12-23 – 2022-12-24 (×2): 1 via RESPIRATORY_TRACT
  Filled 2022-12-22: qty 7

## 2022-12-22 MED ORDER — LEVOTHYROXINE SODIUM 50 MCG PO TABS
50.0000 ug | ORAL_TABLET | Freq: Every day | ORAL | Status: DC
  Administered 2022-12-23 – 2022-12-24 (×2): 50 ug via ORAL
  Filled 2022-12-22 (×2): qty 1

## 2022-12-22 MED ORDER — ONDANSETRON HCL 4 MG/2ML IJ SOLN
4.0000 mg | Freq: Four times a day (QID) | INTRAMUSCULAR | Status: DC | PRN
  Administered 2022-12-23: 4 mg via INTRAVENOUS
  Filled 2022-12-22: qty 2

## 2022-12-22 MED ORDER — GUAIFENESIN ER 600 MG PO TB12
600.0000 mg | ORAL_TABLET | Freq: Two times a day (BID) | ORAL | Status: DC
  Administered 2022-12-22 – 2022-12-23 (×2): 600 mg via ORAL
  Filled 2022-12-22 (×2): qty 1

## 2022-12-22 MED ORDER — MOMETASONE FURO-FORMOTEROL FUM 200-5 MCG/ACT IN AERO
2.0000 | INHALATION_SPRAY | Freq: Two times a day (BID) | RESPIRATORY_TRACT | Status: DC
  Administered 2022-12-22: 2 via RESPIRATORY_TRACT
  Filled 2022-12-22: qty 8.8

## 2022-12-22 MED ORDER — OXYMETAZOLINE HCL 0.05 % NA SOLN
1.0000 | Freq: Two times a day (BID) | NASAL | Status: DC
  Administered 2022-12-23 – 2022-12-24 (×4): 1 via NASAL
  Filled 2022-12-22: qty 30

## 2022-12-22 MED ORDER — KETOROLAC TROMETHAMINE 15 MG/ML IJ SOLN
15.0000 mg | Freq: Once | INTRAMUSCULAR | Status: AC
  Administered 2022-12-22: 15 mg via INTRAVENOUS
  Filled 2022-12-22: qty 1

## 2022-12-22 MED ORDER — IPRATROPIUM-ALBUTEROL 0.5-2.5 (3) MG/3ML IN SOLN: 3.0000 mL | RESPIRATORY_TRACT | Status: DC | PRN

## 2022-12-22 MED ORDER — SERTRALINE HCL 50 MG PO TABS: 100.0000 mg | ORAL_TABLET | Freq: Every day | ORAL | Status: DC

## 2022-12-22 MED ORDER — BUDESON-GLYCOPYRROL-FORMOTEROL 160-9-4.8 MCG/ACT IN AERO: 2.0000 | INHALATION_SPRAY | Freq: Two times a day (BID) | RESPIRATORY_TRACT | Status: DC

## 2022-12-22 MED ORDER — SODIUM CHLORIDE 0.9 % IV BOLUS
1000.0000 mL | Freq: Once | INTRAVENOUS | Status: AC
  Administered 2022-12-22: 1000 mL via INTRAVENOUS

## 2022-12-22 MED ORDER — SERTRALINE HCL 50 MG PO TABS
100.0000 mg | ORAL_TABLET | Freq: Every day | ORAL | Status: DC
  Administered 2022-12-23 – 2022-12-24 (×2): 100 mg via ORAL
  Filled 2022-12-22 (×3): qty 2

## 2022-12-22 MED ORDER — ALBUTEROL SULFATE HFA 108 (90 BASE) MCG/ACT IN AERS: 2.0000 | INHALATION_SPRAY | RESPIRATORY_TRACT | Status: DC | PRN

## 2022-12-22 NOTE — ED Triage Notes (Signed)
Pt comes in with complaints of Covid like symptoms starting over a week and half ago. Pt states fever, chills, nausea, lt lower flank pain, and abdominal pain. Pt states she went to [facility] and was treated for UTI and wheezing and was given an antibiotic with no relief. Pt f/u w/ her PCP and put on additional antibiotic that was finished today and no help.

## 2022-12-22 NOTE — H&P (Signed)
History and Physical    Patient: [name] [name]:[id number] DOB: Apr 21, 1945 DOA: 12/22/2022 DOS: the patient was seen and examined on 12/22/2022 PCP: Andi Devon, MD  Patient coming from: Home  Chief Complaint:  Chief Complaint  Patient presents with   Nausea   HPI: [name] is a 77 y.o. female with medical history significant of T2DM, HTN, depression, chronic interstitial cystitis, HLD, COPD, lupus, hypothyroidism, and fibromyalgia who presents to the ED for evaluation of generalized weakness.  She was in her general state of health until 2 weeks ago when she contracted COVID and started having cough and cold symptoms.  She was seen at the urgent care a few days later and diagnosed with UTI.  She was given Macrobid for the UTI but she did not feel any better.  She called her PCP last Friday and she was prescribed doxycycline and Medrol Dosepak for her respiratory symptoms. Her respiratory symptoms have improved somewhat but she continues to have generalized weakness and low energy. She currently endorses generalized bodyaches, severe nausea without vomiting, cough that is occasionally productive, loss of appetite but denies any fevers, abdominal pain, chest pain or shortness of breath. She has not tolerated any p.o. intake the last few days.  ED course: Afebrile, hemodynamically stable.  Labs significant for AKI with creatinine of 1.48, mild hyponatremia of 129 and positive COVID 19 screen. Normal CBC and UA. CXR shows evidence of COPD but no evidence of pneumonia.  Hospitalist paged to admit for management of AKI.   Review of Systems: As mentioned in the history of present illness. All other systems reviewed and are negative. Past Medical History:  Diagnosis Date   Arthritis    "little bit qwhere" (01/24/2012)   Asthma    Chronic back pain    "neck to tailbone" (01/24/2012)   COPD (chronic obstructive pulmonary disease) (HCC)    Depression    takes Zoloft  daily   Fibromyalgia    History of bladder infections    sees a urologist--on long term Macrodantin nightly   History of blood transfusion    "when I was a child" (01/24/2012)   Hyperlipidemia    takes Simvastatin daily   Hypertension    takes Maxzide daily   Hypothyroidism    takes Synthroid daily   Insomnia    takes Ambien nightly   Lupus    Neck pain    herniated disc and radiculopathy   Peripheral neuropathy    Pneumonia 03/05/2010   "have had it a couple times" (01/24/2012)   Type II diabetes mellitus (HCC)    takes Janumet daily   Past Surgical History:  Procedure Laterality Date   ANTERIOR CERVICAL DECOMP/DISCECTOMY FUSION  2002; 01/24/2012   ANTERIOR CERVICAL DECOMP/DISCECTOMY FUSION  01/24/2012   Procedure: ANTERIOR CERVICAL DECOMPRESSION/DISCECTOMY FUSION 1 LEVEL;  Surgeon: Cristi Loron, MD;  Location: [facility];  Service: Neurosurgery;  Laterality: N/A;  CERVICAL THREE-FOUR anterior cervical decompression with fusion interbody prothesis plating and bonegraft   APPENDECTOMY  11/04/1958   BACK SURGERY  03/06/1999   BLADDER SURGERY  03/06/1975   through abdomen   BLADDER SUSPENSION  03/05/1972   BUNIONECTOMY  11/04/1978   left   CARDIAC CATHETERIZATION  03/05/2006   CATARACT EXTRACTION     COLONOSCOPY     CORONARY STENT INTERVENTION N/A 09/01/2021   Procedure: CORONARY STENT INTERVENTION;  Surgeon: Kathleene Hazel, MD;  Location: [facility];  Service: Cardiovascular;  Laterality: N/A;   ESOPHAGOGASTRODUODENOSCOPY  LEFT HEART CATH AND CORONARY ANGIOGRAPHY N/A 09/01/2021   Procedure: LEFT HEART CATH AND CORONARY ANGIOGRAPHY;  Surgeon: Kathleene Hazel, MD;  Location: [facility];  Service: Cardiovascular;  Laterality: N/A;   LUMBAR FUSION  03/06/2007   MOUTH SURGERY     growth removed inside mouth per patient   RECTOCELE REPAIR  03/05/1998   VAGINAL HYSTERECTOMY  03/05/1972   Social History:  reports that she has been  smoking cigarettes. She has a 60 pack-year smoking history. She has never been exposed to tobacco smoke. She has never used smokeless tobacco. She reports that she does not drink alcohol and does not use drugs.  Allergies  Allergen Reactions   Erythromycin Anaphylaxis   Penicillins Anaphylaxis   Gadolinium Derivatives Hives and Rash    Pt broke out in hives all over body.  Has IV steroids and Benadryl in hospital for MRI.   Iodinated Contrast Media Hives    13-hour prep Pt had slight itching of her abd after her injection and had taken her 13 hr prep.  Consult with radiologist before another procedure to see if IV contrast is necessary, per Dr Roswell Nickel on 01/06/14, JB/   Latex Dermatitis and Other (See Comments)    blisters   Morphine Nausea And Vomiting   Other Itching and Other (See Comments)    Chlorhexin (CHG); "burning"   Prednisone Other (See Comments)    "shakes me out of my frame" IM steroids aren't as bad as PO   Sulfonamide Derivatives Itching and Rash    All over body    Varenicline Tartrate Nausea And Vomiting        Isosorbide Nausea Only    "Short of breath and extremely tired and week also"   Aspirin Nausea Only and Other (See Comments)    Burning in stomach    Atorvastatin Other (See Comments)    System will not process    Family History  Problem Relation Age of Onset   Heart disease Mother 23       Congestive HF, CABG   Arrhythmia Father    Diabetes Sister    Heart disease Sister    Blindness Brother    Heart attack Brother    Diabetes Brother     Prior to Admission medications   Medication Sig Start Date End Date Taking? Authorizing Provider  albuterol (VENTOLIN HFA) 108 (90 Base) MCG/ACT inhaler Inhale 2 puffs into the lungs every 8 (eight) hours as needed (asthma / chest congestion). 04/20/19   [provider]  ALPRAZolam Prudy Feeler) 0.25 MG tablet Take 0.25 mg by mouth 2 (two) times daily as needed for anxiety. 08/23/21   [provider]   aspirin EC 81 MG tablet Take 1 tablet (81 mg total) by mouth daily. Swallow whole. 07/25/22   Rollene Rotunda, MD  BREZTRI AEROSPHERE 160-9-4.8 MCG/ACT AERO Inhale 2 puffs into the lungs 2 (two) times daily. 06/16/19   [provider]  donepezil (ARICEPT) 5 MG tablet Take 5 mg by mouth at bedtime.    [provider]  etanercept (ENBREL MINI) 50 MG/ML injection Inject 1 mL (50 mg total) into the skin once a week. 11/29/22   Pollyann Savoy, MD  Evolocumab (REPATHA SURECLICK) 140 MG/ML SOAJ Inject 140 mg into the skin every 14 (fourteen) days. 11/08/22   Rollene Rotunda, MD  HYDROcodone-acetaminophen (NORCO/VICODIN) 5-325 MG tablet Take by mouth. 08/15/22   [provider]  insulin glargine, 1 Unit Dial, (TOUJEO SOLOSTAR) 300 UNIT/ML Solostar  Pen Inject 0-50 Units into the skin See admin instructions. Sliding scale  Under 120=0 Over 120= 10-20 units in the morning Bedtime 20 units if needed, can take up to 50 units depending in Blood glucose  35 units in the morning and 50 units at bedtime.    [provider]  levothyroxine (SYNTHROID, LEVOTHROID) 50 MCG tablet Take 50 mcg by mouth daily before breakfast.    [provider]  metFORMIN (GLUCOPHAGE) 1000 MG tablet Take 1 tablet (1,000 mg total) by mouth daily with breakfast. 09/04/21   Tereso Newcomer T, PA-C  nitroGLYCERIN (NITROSTAT) 0.4 MG SL tablet Place 1 tablet (0.4 mg total) under the tongue every 5 (five) minutes x 3 doses as needed for chest pain. If no relief after 3rd dose, proceed to the ED for an evaluation 08/09/21   Rollene Rotunda, MD  [facility] VERIO test strip 1 each 4 (four) times daily. 11/10/19   [provider]  sertraline (ZOLOFT) 100 MG tablet Take 100 mg by mouth daily.     [provider]  triamterene-hydrochlorothiazide (MAXZIDE-25) 37.5-25 MG tablet Take 1 tablet by mouth daily.    [provider]  VITAMIN D PO Take 5,000 Units by mouth daily.    [provider]  zolpidem (AMBIEN) 5 MG tablet Take 5 mg by mouth at bedtime. For sleep    [provider]    Physical Exam: Vitals:   12/22/22 1129 12/22/22 1133 12/22/22 1442 12/22/22 1500  BP: 133/82  (!) 148/70 133/67  Pulse: (!) 56  (!) 59 66  Resp: (!) 21     Temp: 97.8 F (36.6 C)     TempSrc: Oral     SpO2: 96%  97% 99%  Weight:  53.1 kg    Height:  5\' 5"  (1.651 m)    General: Pleasant, well-appearing elderly woman laying in bed. No acute distress. HEENT: PERRLA.  Dry mucous membrane. CV: Mild bradycardia.  Normal rhythm. No murmurs, rubs, or gallops. No LE edema Pulmonary: Lungs CTAB. Normal effort. No wheezing, rhonchi or rales. Abdominal: Soft, nontender, nondistended. Normal bowel sounds. Extremities: Palpable radial and DP pulses. Normal ROM. Skin: Warm and dry. Decreased skin turgor.  Data Reviewed:  Chest x-ray: No acute cardiopulmonary disease. CMP shows creatinine 1.48, sodium 129 Positive SARS coronavirus 2  Assessment and Plan: [name] is a 77 y.o. female with medical history significant of T2DM, HTN, depression, chronic interstitial cystitis, HLD, COPD, lupus, hypothyroidism, and fibromyalgia who presents to the ED for evaluation of generalized weakness and admitted for management of AKI.  #AKI #Generalized weakness Patient was found to have creatinine bump of 1.48 on admission from normal baseline creatinine. AKI likely in the setting of dehydration from decreased p.o. intake over the last week. She is hemodynamically stable with SBP in the 100s to 130s.  Will provide IV hydration and repeat labs in the morning. Status post 1 L normal saline. -IV LR 100 cc/h for 10 hours -AM BMP -PT/OT eval and treat  #Mild hyponatremia Sodium of 129 on admission. This is likely secondary to hypovolemic hyponatremia.  Anticipate improvement with IV hydration. -Follow-up a.m. BMP  #Positive COVID-19 Patient reports contracting COVID about 2 weeks  ago.  She has completed antibiotics and Medrol Dosepak and currently does not show any respiratory symptoms to indicate further treatment for this.  Her respiratory exam is unremarkable. -Airborne and contact precautions  #COPD Radiological evidence of COPD on CXR but no evidence of pneumonia. No wheezing  on exam but continues to have cough with occasional sputum production.  -As needed DuoNeb -Resume home Breztri inhaler -Mucinex 600 mg twice daily for cough -Supplemental O2 as needed  #T2DM Last A1c 6.0% 1 year ago.  CBG in the 90s due to poor p.o. intake. -Metformin 1000 mg daily with breakfast -SSI with CBG monitoring -Carb modified diet  #HTN -Hold BP meds  #Hypothyroidism -Resume Synthroid 50 mcg daily  #Depression -Resume sertraline 100 mg daily   Advance Care Planning:   Code Status: Full Code   Consults: None  Family Communication: Discussed admission with son-in-law at bedside  Severity of Illness: The appropriate patient status for this patient is INPATIENT. Inpatient status is judged to be reasonable and necessary in order to provide the required intensity of service to ensure the patient's safety. The patient's presenting symptoms, physical exam findings, and initial radiographic and laboratory data in the context of their chronic comorbidities is felt to place them at high risk for further clinical deterioration. Furthermore, it is not anticipated that the patient will be medically stable for discharge from the hospital within 2 midnights of admission.   * I certify that at the point of admission it is my clinical judgment that the patient will require inpatient hospital care spanning beyond 2 midnights from the point of admission due to high intensity of service, high risk for further deterioration and high frequency of surveillance required.*  Author: Steffanie Rainwater, MD 12/22/2022 3:37 PM  For on call review [email or web address].

## 2022-12-22 NOTE — ED Provider Notes (Signed)
[place] EMERGENCY DEPARTMENT AT [facility] Provider Note   CSN: [id number] Arrival date & time: 12/22/22  1041     History  Chief Complaint  Patient presents with   Nausea    [name] is a 77 y.o. female.  Patient to ED with symptoms that started 2 weeks ago of generalized body aches, severe nausea without vomiting, cough; hot/cold chills. She was seen on day 2 of symptoms at Urgent Care and given Macrobid for a UTI (subsequent cultures +Strep pyogenes). She was no better one week later (6 days ago) and had a telephone consultation with her PCP who started Doxycyline and a medrol dosepack to treat respiratory symptoms. She continues to have persistent symptoms, is unable to eat or drink much due to nausea and is feeling weak. She reports her sister has similar symptoms as well.   The history is provided by the patient. No language interpreter was used.       Home Medications Prior to Admission medications   Medication Sig Start Date End Date Taking? Authorizing Provider  albuterol (VENTOLIN HFA) 108 (90 Base) MCG/ACT inhaler Inhale 2 puffs into the lungs every 8 (eight) hours as needed (asthma / chest congestion). 04/20/19   [provider]  ALPRAZolam Prudy Feeler) 0.25 MG tablet Take 0.25 mg by mouth 2 (two) times daily as needed for anxiety. 08/23/21   [provider]  aspirin EC 81 MG tablet Take 1 tablet (81 mg total) by mouth daily. Swallow whole. 07/25/22   Rollene Rotunda, MD  BREZTRI AEROSPHERE 160-9-4.8 MCG/ACT AERO Inhale 2 puffs into the lungs 2 (two) times daily. 06/16/19   [provider]  donepezil (ARICEPT) 5 MG tablet Take 5 mg by mouth at bedtime.    [provider]  etanercept (ENBREL MINI) 50 MG/ML injection Inject 1 mL (50 mg total) into the skin once a week. 11/29/22   Pollyann Savoy, MD  Evolocumab (REPATHA SURECLICK) 140 MG/ML SOAJ Inject 140 mg into the skin every 14 (fourteen) days. 11/08/22   Rollene Rotunda, MD  HYDROcodone-acetaminophen (NORCO/VICODIN) 5-325 MG tablet Take by mouth. 08/15/22   [provider]  insulin glargine, 1 Unit Dial, (TOUJEO SOLOSTAR) 300 UNIT/ML Solostar Pen Inject 0-50 Units into the skin See admin instructions. Sliding scale  Under 120=0 Over 120= 10-20 units in the morning Bedtime 20 units if needed, can take up to 50 units depending in Blood glucose  35 units in the morning and 50 units at bedtime.    [provider]  levothyroxine (SYNTHROID, LEVOTHROID) 50 MCG tablet Take 50 mcg by mouth daily before breakfast.    [provider]  metFORMIN (GLUCOPHAGE) 1000 MG tablet Take 1 tablet (1,000 mg total) by mouth daily with breakfast. 09/04/21   Tereso Newcomer T, PA-C  nitroGLYCERIN (NITROSTAT) 0.4 MG SL tablet Place 1 tablet (0.4 mg total) under the tongue every 5 (five) minutes x 3 doses as needed for chest pain. If no relief after 3rd dose, proceed to the ED for an evaluation 08/09/21   Rollene Rotunda, MD  [facility] VERIO test strip 1 each 4 (four) times daily. 11/10/19   [provider]  sertraline (ZOLOFT) 100 MG tablet Take 100 mg by mouth daily.     [provider]  triamterene-hydrochlorothiazide (MAXZIDE-25) 37.5-25 MG tablet Take 1 tablet by mouth daily.    [provider]  VITAMIN D PO Take 5,000 Units by mouth daily.    [provider]  zolpidem (AMBIEN) 5  MG tablet Take 5 mg by mouth at bedtime. For sleep    [provider]      Allergies    Erythromycin, Penicillins, Gadolinium derivatives, Iodinated contrast media, Latex, Morphine, Other, Prednisone, Sulfonamide derivatives, Varenicline tartrate, Isosorbide, Aspirin, and Atorvastatin    Review of Systems   Review of Systems  Physical Exam Updated Vital Signs BP 133/67   Pulse 66   Temp 97.8 F (36.6 C) (Oral)   Resp (!) 21   Ht 5\' 5"  (1.651 m)   Wt 53.1 kg   SpO2 99%   BMI 19.47 kg/m  Physical Exam Vitals and nursing note  reviewed.  Constitutional:      General: She is not in acute distress. HENT:     Mouth/Throat:     Mouth: Mucous membranes are dry.  Eyes:     Conjunctiva/sclera: Conjunctivae normal.  Cardiovascular:     Rate and Rhythm: Normal rate and regular rhythm.     Heart sounds: No murmur heard. Pulmonary:     Effort: Pulmonary effort is normal.     Breath sounds: No wheezing, rhonchi or rales.  Abdominal:     General: There is no distension.     Palpations: Abdomen is soft.     Tenderness: There is no abdominal tenderness.  Musculoskeletal:        General: Normal range of motion.     Cervical back: Normal range of motion and neck supple.  Skin:    General: Skin is warm and dry.  Neurological:     General: No focal deficit present.     Mental Status: She is alert and oriented to person, place, and time.     ED Results / Procedures / Treatments   Labs (all labs ordered are listed, but only abnormal results are displayed) Labs Reviewed  SARS CORONAVIRUS 2 BY RT PCR - Abnormal; Notable for the following components:      Result Value   SARS Coronavirus 2 by RT PCR POSITIVE (*)    All other components within normal limits  COMPREHENSIVE METABOLIC PANEL - Abnormal; Notable for the following components:   Sodium 129 (*)    Chloride 93 (*)    Glucose, Bld 112 (*)    BUN 44 (*)    Creatinine, Ser 1.48 (*)    Calcium 8.2 (*)    GFR, Estimated 36 (*)    All other components within normal limits  CBC WITH DIFFERENTIAL/PLATELET  URINALYSIS, ROUTINE W REFLEX MICROSCOPIC   Results for orders placed or performed during the hospital encounter of 12/22/22 (from the past 24 hour(s))  SARS Coronavirus 2 by RT PCR (hospital order, performed in [facility] hospital lab) *cepheid single result test* Anterior Nasal Swab     Status: Abnormal   Collection Time: 12/22/22 11:40 AM   Specimen: Anterior Nasal Swab  Result Value Ref Range   SARS Coronavirus 2 by RT PCR POSITIVE (A) NEGATIVE  CBC with  Differential     Status: None   Collection Time: 12/22/22  2:34 PM  Result Value Ref Range   WBC 7.2 4.0 - 10.5 K/uL   RBC 4.88 3.87 - 5.11 MIL/uL   Hemoglobin 15.0 12.0 - 15.0 g/dL   HCT 01.0 27.2 - 53.6 %   MCV 90.8 80.0 - 100.0 fL   MCH 30.7 26.0 - 34.0 pg   MCHC 33.9 30.0 - 36.0 g/dL   RDW 64.4 03.4 - 74.2 %   Platelets 260 150 - 400 K/uL  nRBC 0.0 0.0 - 0.2 %   Neutrophils Relative % 76 %   Neutro Abs 5.4 1.7 - 7.7 K/uL   Lymphocytes Relative 15 %   Lymphs Abs 1.1 0.7 - 4.0 K/uL   Monocytes Relative 7 %   Monocytes Absolute 0.5 0.1 - 1.0 K/uL   Eosinophils Relative 1 %   Eosinophils Absolute 0.1 0.0 - 0.5 K/uL   Basophils Relative 1 %   Basophils Absolute 0.0 0.0 - 0.1 K/uL   Immature Granulocytes 0 %   Abs Immature Granulocytes 0.03 0.00 - 0.07 K/uL  Comprehensive metabolic panel     Status: Abnormal   Collection Time: 12/22/22  2:34 PM  Result Value Ref Range   Sodium 129 (L) 135 - 145 mmol/L   Potassium 3.6 3.5 - 5.1 mmol/L   Chloride 93 (L) 98 - 111 mmol/L   CO2 25 22 - 32 mmol/L   Glucose, Bld 112 (H) 70 - 99 mg/dL   BUN 44 (H) 8 - 23 mg/dL   Creatinine, Ser 7.25 (H) 0.44 - 1.00 mg/dL   Calcium 8.2 (L) 8.9 - 10.3 mg/dL   Total Protein 7.5 6.5 - 8.1 g/dL   Albumin 3.7 3.5 - 5.0 g/dL   AST 20 15 - 41 U/L   ALT 17 0 - 44 U/L   Alkaline Phosphatase 67 38 - 126 U/L   Total Bilirubin 0.7 0.3 - 1.2 mg/dL   GFR, Estimated 36 (L) >60 mL/min   Anion gap 11 5 - 15    Results for orders placed or performed during the hospital encounter of 12/22/22 (from the past 24 hour(s))  SARS Coronavirus 2 by RT PCR (hospital order, performed in [facility] hospital lab) *cepheid single result test* Anterior Nasal Swab     Status: Abnormal   Collection Time: 12/22/22 11:40 AM   Specimen: Anterior Nasal Swab  Result Value Ref Range   SARS Coronavirus 2 by RT PCR POSITIVE (A) NEGATIVE  CBC with Differential     Status: None   Collection Time: 12/22/22  2:34 PM  Result Value Ref  Range   WBC 7.2 4.0 - 10.5 K/uL   RBC 4.88 3.87 - 5.11 MIL/uL   Hemoglobin 15.0 12.0 - 15.0 g/dL   HCT 36.6 44.0 - 34.7 %   MCV 90.8 80.0 - 100.0 fL   MCH 30.7 26.0 - 34.0 pg   MCHC 33.9 30.0 - 36.0 g/dL   RDW 42.5 95.6 - 38.7 %   Platelets 260 150 - 400 K/uL   nRBC 0.0 0.0 - 0.2 %   Neutrophils Relative % 76 %   Neutro Abs 5.4 1.7 - 7.7 K/uL   Lymphocytes Relative 15 %   Lymphs Abs 1.1 0.7 - 4.0 K/uL   Monocytes Relative 7 %   Monocytes Absolute 0.5 0.1 - 1.0 K/uL   Eosinophils Relative 1 %   Eosinophils Absolute 0.1 0.0 - 0.5 K/uL   Basophils Relative 1 %   Basophils Absolute 0.0 0.0 - 0.1 K/uL   Immature Granulocytes 0 %   Abs Immature Granulocytes 0.03 0.00 - 0.07 K/uL  Comprehensive metabolic panel     Status: Abnormal   Collection Time: 12/22/22  2:34 PM  Result Value Ref Range   Sodium 129 (L) 135 - 145 mmol/L   Potassium 3.6 3.5 - 5.1 mmol/L   Chloride 93 (L) 98 - 111 mmol/L   CO2 25 22 - 32 mmol/L   Glucose, Bld 112 (H) 70 - 99 mg/dL  BUN 44 (H) 8 - 23 mg/dL   Creatinine, Ser 1.76 (H) 0.44 - 1.00 mg/dL   Calcium 8.2 (L) 8.9 - 10.3 mg/dL   Total Protein 7.5 6.5 - 8.1 g/dL   Albumin 3.7 3.5 - 5.0 g/dL   AST 20 15 - 41 U/L   ALT 17 0 - 44 U/L   Alkaline Phosphatase 67 38 - 126 U/L   Total Bilirubin 0.7 0.3 - 1.2 mg/dL   GFR, Estimated 36 (L) >60 mL/min   Anion gap 11 5 - 15     EKG None  Radiology DG Chest Port 1 View  Result Date: 12/22/2022 CLINICAL DATA:  [id number] COVID [id number]. Fever, chills, nausea. History of COPD and asthma. EXAM: PORTABLE CHEST 1 VIEW COMPARISON:  12/10/2022. FINDINGS: Bilateral lungs appear hyperexpanded with coarse bronchovascular markings, in keeping with COPD. Bilateral lungs otherwise appear clear. No dense consolidation or lung collapse. Bilateral costophrenic angles are clear. Normal cardio-mediastinal silhouette. No acute osseous abnormalities. The soft tissues are within normal limits. IMPRESSION: *No radiographic evidence of  acute cardiopulmonary disease. COPD. Electronically Signed   By: Jules Schick M.D.   On: 12/22/2022 12:57    Procedures Procedures    Medications Ordered in ED Medications  albuterol (VENTOLIN HFA) 108 (90 Base) MCG/ACT inhaler 2 puff (has no administration in time range)  sodium chloride 0.9 % bolus 1,000 mL (1,000 mLs Intravenous New Bag/Given 12/22/22 1437)  ketorolac (TORADOL) 15 MG/ML injection 15 mg (15 mg Intravenous Given 12/22/22 1431)    ED Course/ Medical Decision Making/ A&P Clinical Course as of 12/22/22 1536  Sat Dec 22, 2022  1342 Patient with COVID test today. Appears dehydrated, dry oral mucosa, unable to eat/drink substantially x 2 weeks. Labs pending. IV fluids ordered with Toradol for aches.  [SU]    Clinical Course User Index [SU] Elpidio Anis, PA-C                                 Medical Decision Making This patient presents to the ED for concern of chills, cough, body aches, cough, this involves an extensive number of treatment options, and is a complaint that carries with it a high risk of complications and morbidity.  The differential diagnosis includes PNA, COVID, viral syndrome, sepsis   Co morbidities that complicate the patient evaluation  Diabetic history (NIDDM)   Additional history obtained:  Additional history obtained from NA External records from outside source obtained and reviewed including [facility]   Lab Tests:  I Ordered, and personally interpreted labs.  The pertinent results include:  COVID positive, Cr 1.48 GFR 36 representing mild AKI   Imaging Studies ordered:  I ordered imaging studies including CXR  I independently visualized and interpreted imaging which showed clear - no infiltrates or consolidations I agree with the radiologist interpretation   Cardiac Monitoring: / EKG:   Consultations Obtained:  I requested consultation with the hospitalist for admission,  and discussed lab and imaging findings as  well as pertinent plan - they recommend: accepted for admission   Problem List / ED Course / Critical interventions / Medication management  Appears dry - not eating or drinking in 2 weeks IV fluids ordered, labs pending  I ordered medication including Toradol  for aches/discomfort  Reevaluation of the patient after these medicines showed that the patient improved I have reviewed the patients home medicines and have made adjustments as needed  Test / Admission - Considered:  Patient with an AKI, dehydration, COVID+, h/o diabetes. Will admit for stabilization.     Amount and/or Complexity of Data Reviewed Labs: ordered. Radiology: ordered.  Risk Prescription drug management. Decision regarding hospitalization.           Final Clinical Impression(s) / ED Diagnoses Final diagnoses:  COVID  Dehydration  AKI (acute kidney injury) [facility])    Rx / DC Orders ED Discharge Orders     None         Elpidio Anis, PA-C 12/22/22 1536    Sloan Leiter, DO 12/23/22 [phone]

## 2022-12-23 DIAGNOSIS — U071 COVID-19: Secondary | ICD-10-CM | POA: Diagnosis not present

## 2022-12-23 DIAGNOSIS — E860 Dehydration: Secondary | ICD-10-CM | POA: Diagnosis not present

## 2022-12-23 DIAGNOSIS — N179 Acute kidney failure, unspecified: Secondary | ICD-10-CM

## 2022-12-23 LAB — GLUCOSE, CAPILLARY
Glucose-Capillary: 80 mg/dL (ref 70–99)
Glucose-Capillary: 68 mg/dL — ABNORMAL LOW (ref 70–99)
Glucose-Capillary: 69 mg/dL — ABNORMAL LOW (ref 70–99)
Glucose-Capillary: 98 mg/dL (ref 70–99)
Glucose-Capillary: 99 mg/dL (ref 70–99)
Glucose-Capillary: 171 mg/dL — ABNORMAL HIGH (ref 70–99)

## 2022-12-23 LAB — BASIC METABOLIC PANEL
Anion gap: 7 (ref 5–15)
BUN: 45 mg/dL — ABNORMAL HIGH (ref 8–23)
CO2: 26 mmol/L (ref 22–32)
Calcium: 7.8 mg/dL — ABNORMAL LOW (ref 8.9–10.3)
Chloride: 99 mmol/L (ref 98–111)
Creatinine, Ser: 1.46 mg/dL — ABNORMAL HIGH (ref 0.44–1.00)
GFR, Estimated: 37 mL/min — ABNORMAL LOW (ref >60)
Glucose, Bld: 79 mg/dL (ref 70–99)
Potassium: 3.8 mmol/L (ref 3.5–5.1)
Sodium: 132 mmol/L — ABNORMAL LOW (ref 135–145)

## 2022-12-23 LAB — HEMOGLOBIN A1C
Hgb A1c MFr Bld: 5.7 % — ABNORMAL HIGH (ref 4.8–5.6)
Mean Plasma Glucose: 116.89 mg/dL

## 2022-12-23 MED ORDER — BENZONATATE 100 MG PO CAPS: 200.0000 mg | ORAL_CAPSULE | Freq: Three times a day (TID) | ORAL | Status: DC | PRN

## 2022-12-23 MED ORDER — SODIUM CHLORIDE 0.9 % IV SOLN: 12.5000 mg | Freq: Four times a day (QID) | INTRAVENOUS | Status: DC | PRN

## 2022-12-23 MED ORDER — GUAIFENESIN-DM 100-10 MG/5ML PO SYRP
15.0000 mL | ORAL_SOLUTION | ORAL | Status: DC | PRN
  Administered 2022-12-23: 15 mL via ORAL
  Filled 2022-12-23: qty 15

## 2022-12-23 MED ORDER — METHYLPREDNISOLONE 4 MG PO TABS
8.0000 mg | ORAL_TABLET | Freq: Every day | ORAL | Status: DC
  Administered 2022-12-23: 8 mg via ORAL
  Filled 2022-12-23 (×3): qty 2

## 2022-12-23 MED ORDER — TRAMADOL HCL 50 MG PO TABS: 50.0000 mg | ORAL_TABLET | Freq: Two times a day (BID) | ORAL | Status: DC | PRN

## 2022-12-23 MED ORDER — TRAMADOL HCL 50 MG PO TABS
100.0000 mg | ORAL_TABLET | Freq: Two times a day (BID) | ORAL | Status: DC | PRN
  Administered 2022-12-23: 100 mg via ORAL
  Filled 2022-12-23: qty 2

## 2022-12-23 MED ORDER — PANTOPRAZOLE SODIUM 40 MG PO TBEC
40.0000 mg | DELAYED_RELEASE_TABLET | Freq: Every day | ORAL | Status: DC
  Administered 2022-12-23 – 2022-12-24 (×2): 40 mg via ORAL
  Filled 2022-12-23 (×2): qty 1

## 2022-12-23 MED ORDER — BENZONATATE 100 MG PO CAPS: 200.0000 mg | ORAL_CAPSULE | Freq: Three times a day (TID) | ORAL | Status: DC

## 2022-12-23 NOTE — Care Management Obs Status (Signed)
MEDICARE OBSERVATION STATUS NOTIFICATION   Patient Details  Name: CHARNIECE KWIAT MRN: [id number] Date of Birth: 12-15-1945   Medicare Observation Status Notification Given:  Yes    Macey Wurtz A Cherylin Waguespack, RN 12/23/2022, 4:24 PM

## 2022-12-23 NOTE — Care Management CC44 (Signed)
Condition Code 44 Documentation Completed  Patient Details  Name: [name] MRN: [id number] Date of Birth: November 04, 1945   Condition Code 44 given:  Yes Patient signature on Condition Code 44 notice:  Yes Documentation of 2 MD's agreement:  Yes Code 44 added to claim:  Yes    Joushua Dugar A Pasqualina Colasurdo, RN 12/23/2022, 4:24 PM

## 2022-12-23 NOTE — Progress Notes (Signed)
[facility]                                                                               [employer], is a 77 y.o. female, DOB - 1945/05/20, WGN:[id number] Admit date - 12/22/2022    Outpatient Primary MD for the patient is Andi Devon, MD  LOS - 1  days    Brief summary   [name] is a 77 y.o. female with medical history significant of T2DM, HTN, depression, chronic interstitial cystitis, HLD, COPD, lupus, hypothyroidism, and fibromyalgia who presents to the ED for evaluation of generalized weakness.  She was in her general state of health until 2 weeks ago when she contracted COVID and started having cough and cold symptoms.  She was seen at the urgent care a few days later and diagnosed with UTI.  She was given Macrobid for the UTI but she did not feel any better.  She called her PCP last Friday and she was prescribed doxycycline and Medrol Dosepak for her respiratory symptoms.   She was found to be In mild AKI and hyponatremia. Admitted for further evaluation.    Assessment & Plan    Assessment and Plan:  AKI/ Generalized weakness;  Admitted for a creatinine of 1.4 Baseline creatinine of 1 to 1.1 Started on IV fluids,  Recheck renal parameters in am.    Hyponatremia;  Improved form 129 to 132.    COVID infection S/p medrol dose pack. Duonebs prn.  Pt on RA.  Cough meds.    Hypothyroidism:  Resume synthroid.    Hypertension:  Optimal BP parameters.    Fibromyalgia; Stable.    Type 2 DM, non insulin dependent.  CBG (last 3)  Recent Labs    12/23/22 1305 12/23/22 1324 12/23/22 1719  GLUCAP 69* 98 99   With hypoglycemia.   H/o chronic interstitial cystitis.  Stable.     Estimated body mass index is 19.74 kg/m as calculated from the following:   Height as of this encounter: 5\' 5"  (1.651 m).   Weight as of this encounter: 53.8 kg.  Code Status: full code.  DVT Prophylaxis:  enoxaparin (LOVENOX) injection  30 mg Start: 12/22/22 2200   Level of Care: Level of care: Med-Surg Family Communication: none at bedside.   Disposition Plan:     Remains inpatient appropriate:  dehydration, hyponatremia and AKI.   Procedures:  None.   Consultants:   None.   Antimicrobials:   Anti-infectives (From admission, onward)    None        Medications  Scheduled Meds:  enoxaparin (LOVENOX) injection  30 mg Subcutaneous Q24H   fluticasone furoate-vilanterol  1 puff Inhalation Daily   And   umeclidinium bromide  1 puff Inhalation Daily   insulin aspart  0-15 Units Subcutaneous TID WC   insulin aspart  0-5 Units Subcutaneous QHS   levothyroxine  50 mcg Oral Q0600   metFORMIN  1,000 mg Oral Q breakfast   methylPREDNISolone  8 mg Oral Daily   oxymetazoline  1 spray Each Nare BID   pantoprazole  40 mg Oral Daily   sertraline  100 mg Oral Daily   zolpidem  5 mg Oral QHS   Continuous Infusions:  promethazine (PHENERGAN) injection (IM or IVPB)     PRN Meds:.acetaminophen **OR** acetaminophen, benzonatate, guaiFENesin-dextromethorphan, ipratropium-albuterol, ondansetron **OR** ondansetron (ZOFRAN) IV, promethazine (PHENERGAN) injection (IM or IVPB), senna-docusate, traMADol    Subjective:   [place] was seen and examined today.  Feeling better.   Objective:   Vitals:   12/23/22 0516 12/23/22 0812 12/23/22 0842 12/23/22 1722  BP: (!) 116/51 136/68  (!) 132/56  Pulse: (!) 58 60  (!) 55  Resp: 16 18  18   Temp: 97.9 F (36.6 C) 98.1 F (36.7 C)  98.2 F (36.8 C)  TempSrc: Oral Oral  Oral  SpO2: 96% 98% 99% 95%  Weight:      Height:        Intake/Output Summary (Last 24 hours) at 12/23/2022 1745 Last data filed at 12/23/2022 1308 Gross per 24 hour  Intake 2155.13 ml  Output --  Net 2155.13 ml   Filed Weights   12/22/22 1133 12/22/22 1734  Weight: 53.1 kg 53.8 kg     Exam General exam: Appears calm and comfortable  Respiratory system: Clear to auscultation.  Respiratory effort normal. Cardiovascular system: S1 & S2 heard, RRR.  Gastrointestinal system: Abdomen is nondistended, soft and nontender.  Central nervous system: Alert and oriented. No focal neurological deficits. Extremities: Symmetric 5 x 5 power. Skin: No rashes,    Data Reviewed:  I have personally reviewed following labs and imaging studies   CBC Lab Results  Component Value Date   WBC 7.2 12/22/2022   RBC 4.88 12/22/2022   HGB 15.0 12/22/2022   HCT 44.3 12/22/2022   MCV 90.8 12/22/2022   MCH 30.7 12/22/2022   PLT 260 12/22/2022   MCHC 33.9 12/22/2022   RDW 13.7 12/22/2022   LYMPHSABS 1.1 12/22/2022   MONOABS 0.5 12/22/2022   EOSABS 0.1 12/22/2022   BASOSABS 0.0 12/22/2022     Last metabolic panel Lab Results  Component Value Date   NA 132 (L) 12/23/2022   K 3.8 12/23/2022   CL 99 12/23/2022   CO2 26 12/23/2022   BUN 45 (H) 12/23/2022   CREATININE 1.46 (H) 12/23/2022   GLUCOSE 79 12/23/2022   GFRNONAA 37 (L) 12/23/2022   GFRAA 52 (L) 06/16/2020   CALCIUM 7.8 (L) 12/23/2022   PROT 7.5 12/22/2022   ALBUMIN 3.7 12/22/2022   BILITOT 0.7 12/22/2022   ALKPHOS 67 12/22/2022   AST 20 12/22/2022   ALT 17 12/22/2022   ANIONGAP 7 12/23/2022    CBG (last 3)  Recent Labs    12/23/22 1305 12/23/22 1324 12/23/22 1719  GLUCAP 69* 98 99      Coagulation Profile: No results for input(s): "INR", "PROTIME" in the last 168 hours.   Radiology Studies: DG Chest Port 1 View  Result Date: 12/22/2022 CLINICAL DATA:  [id number] COVID [id number]. Fever, chills, nausea. History of COPD and asthma. EXAM: PORTABLE CHEST 1 VIEW COMPARISON:  12/10/2022. FINDINGS: Bilateral lungs appear hyperexpanded with coarse bronchovascular markings, in keeping with COPD. Bilateral lungs otherwise appear clear. No dense consolidation or lung collapse. Bilateral costophrenic angles are clear. Normal cardio-mediastinal silhouette. No acute osseous abnormalities. The soft tissues are within  normal limits. IMPRESSION: *No radiographic evidence of acute cardiopulmonary disease. COPD. Electronically Signed   By: Jules Schick M.D.   On: 12/22/2022 12:57       Kathlen Mody M.D. [facility] 12/23/2022, 5:45 PM  Available via Epic secure chat 7am-7pm After 7 pm, please refer to  night coverage provider listed on amion.

## 2022-12-23 NOTE — TOC CM/SW Note (Signed)
Transition of Care [facility]) - Inpatient Brief Assessment   Patient Details  Name: [name] MRN: [id number] Date of Birth: 03-31-1945  Transition of Care [facility]) CM/SW Contact:    Villa Herb, LCSWA Phone Number: 12/23/2022, 11:41 AM   Clinical Narrative: Transition of Care Department [facility]) has reviewed patient and no TOC needs have been identified at this time. We will continue to monitor patient advancement through interdisciplinary progression rounds. If new patient transition needs arise, please place a TOC consult.  Transition of Care Asessment: Insurance and Status: Insurance coverage has been reviewed Patient has primary care physician: Yes Home environment has been reviewed: from home Prior level of function:: independent Prior/Current Home Services: No current home services Social Determinants of Health Reivew: SDOH reviewed no interventions necessary Readmission risk has been reviewed: Yes Transition of care needs: no transition of care needs at this time

## 2022-12-23 NOTE — Plan of Care (Signed)
  Problem: Activity: Goal: Risk for activity intolerance will decrease Outcome: Progressing   Problem: Nutrition: Goal: Adequate nutrition will be maintained Outcome: Progressing   Problem: Coping: Goal: Level of anxiety will decrease Outcome: Progressing   Problem: Pain Managment: Goal: General experience of comfort will improve Outcome: Progressing   Problem: Safety: Goal: Ability to remain free from injury will improve Outcome: Progressing   Problem: Respiratory: Goal: Complications related to the disease process, condition or treatment will be avoided or minimized Outcome: Progressing   Problem: Metabolic: Goal: Ability to maintain appropriate glucose levels will improve Outcome: Progressing

## 2022-12-24 DIAGNOSIS — E860 Dehydration: Secondary | ICD-10-CM | POA: Diagnosis not present

## 2022-12-24 DIAGNOSIS — U071 COVID-19: Secondary | ICD-10-CM | POA: Diagnosis not present

## 2022-12-24 DIAGNOSIS — N179 Acute kidney failure, unspecified: Secondary | ICD-10-CM | POA: Diagnosis not present

## 2022-12-24 LAB — BASIC METABOLIC PANEL
Anion gap: 8 (ref 5–15)
BUN: 36 mg/dL — ABNORMAL HIGH (ref 8–23)
CO2: 27 mmol/L (ref 22–32)
Calcium: 8 mg/dL — ABNORMAL LOW (ref 8.9–10.3)
Chloride: 98 mmol/L (ref 98–111)
Creatinine, Ser: 1.29 mg/dL — ABNORMAL HIGH (ref 0.44–1.00)
GFR, Estimated: 43 mL/min — ABNORMAL LOW (ref >60)
Glucose, Bld: 87 mg/dL (ref 70–99)
Potassium: 3.8 mmol/L (ref 3.5–5.1)
Sodium: 133 mmol/L — ABNORMAL LOW (ref 135–145)

## 2022-12-24 LAB — GLUCOSE, CAPILLARY
Glucose-Capillary: 104 mg/dL — ABNORMAL HIGH (ref 70–99)
Glucose-Capillary: 64 mg/dL — ABNORMAL LOW (ref 70–99)

## 2022-12-24 MED ORDER — GUAIFENESIN-DM 100-10 MG/5ML PO SYRP: 15.0000 mL | ORAL_SOLUTION | ORAL | 0 refills | Status: DC | PRN

## 2022-12-24 MED ORDER — BENZONATATE 200 MG PO CAPS: 200.0000 mg | ORAL_CAPSULE | Freq: Three times a day (TID) | ORAL | 0 refills | Status: DC | PRN

## 2022-12-24 MED ORDER — METHYLPREDNISOLONE 8 MG PO TABS: 4.0000 mg | ORAL_TABLET | Freq: Every day | ORAL | 0 refills | Status: AC

## 2022-12-24 NOTE — Evaluation (Signed)
Physical Therapy Evaluation Patient Details Name: [name] MRN: [id number] DOB: 1945/03/16 Today's Date: 12/24/2022  History of Present Illness  [name] is a 77 y.o. female with medical history significant of T2DM, HTN, depression, chronic interstitial cystitis, HLD, COPD, lupus, hypothyroidism, and fibromyalgia who presents to the ED for evaluation of generalized weakness.  She was in her general state of health until 2 weeks ago when she contracted COVID and started having cough and cold symptoms.  She was seen at the urgent care a few days later and diagnosed with UTI.  She was given Macrobid for the UTI but she did not feel any better.  She called her PCP last Friday and she was prescribed doxycycline and Medrol Dosepak for her respiratory symptoms. Her respiratory symptoms have improved somewhat but she continues to have generalized weakness and low energy. She currently endorses generalized bodyaches, severe nausea without vomiting, cough that is occasionally productive, loss of appetite but denies any fevers, abdominal pain, chest pain or shortness of breath. She has not tolerated any p.o. intake the last few days.   Clinical Impression  Patient functioning near baseline for functional mobility and gait demonstrating good return for ambulating in room without loss of balance or need for an AD.  Plan:  Patient discharged from physical therapy to care of nursing for ambulation daily as tolerated for length of stay.          If plan is discharge home, recommend the following: Help with stairs or ramp for entrance   Can travel by private vehicle        Equipment Recommendations None recommended by PT  Recommendations for Other Services       Functional Status Assessment Patient has had a recent decline in their functional status and demonstrates the ability to make significant improvements in function in a reasonable and predictable amount of time.      Precautions / Restrictions Precautions Precautions: Fall Restrictions Weight Bearing Restrictions: No      Mobility  Bed Mobility Overal bed mobility: Independent                  Transfers Overall transfer level: Needs assistance   Transfers: Sit to/from Stand, Bed to chair/wheelchair/BSC Sit to Stand: Independent, Supervision   Step pivot transfers: Independent, Supervision       General transfer comment: slightly labored movement, increased time    Ambulation/Gait Ambulation/Gait assistance: Supervision, Modified independent (Device/Increase time) Gait Distance (Feet): 35 Feet Assistive device: None Gait Pattern/deviations: Decreased step length - right, Decreased step length - left, WFL(Within Functional Limits) Gait velocity: decreased     General Gait Details: slightly labored cadence without loss of balance or need for an AD demonstrating good return for walking in room  Stairs            Wheelchair Mobility     Tilt Bed    Modified Rankin (Stroke Patients Only)       Balance Overall balance assessment: Mild deficits observed, not formally tested                                           Pertinent Vitals/Pain Pain Assessment Pain Assessment: No/denies pain    Home Living Family/patient expects to be discharged to:: Private residence Living Arrangements: Alone Available Help at Discharge: Family;Available PRN/intermittently Type of Home: House Home Access: Stairs to enter  Entrance Stairs-Rails: Left Entrance Stairs-Number of Steps: 3   Home Layout: One level Home Equipment: Cane - quad;Other (comment);Grab bars - tub/shower;Rolling Walker (2 wheels)      Prior Function Prior Level of Function : Independent/Modified Independent             Mobility Comments: Independent ADLs Comments: Independent     Extremity/Trunk Assessment   Upper Extremity Assessment Upper Extremity Assessment: Defer to OT  evaluation    Lower Extremity Assessment Lower Extremity Assessment: Overall WFL for tasks assessed    Cervical / Trunk Assessment Cervical / Trunk Assessment: Normal  Communication   Communication Communication: No apparent difficulties  Cognition Arousal: Alert Behavior During Therapy: WFL for tasks assessed/performed Overall Cognitive Status: Within Functional Limits for tasks assessed                                          General Comments      Exercises     Assessment/Plan    PT Assessment Patient does not need any further PT services  PT Problem List         PT Treatment Interventions      PT Goals (Current goals can be found in the Care Plan section)  Acute Rehab PT Goals Patient Stated Goal: return home with family to assist PT Goal Formulation: With patient Time For Goal Achievement: 12/24/22 Potential to Achieve Goals: Good    Frequency       Co-evaluation PT/OT/SLP Co-Evaluation/Treatment: Yes Reason for Co-Treatment: To address functional/ADL transfers PT goals addressed during session: Mobility/safety with mobility;Balance         AM-PAC PT "6 Clicks" Mobility  Outcome Measure Help needed turning from your back to your side while in a flat bed without using bedrails?: None Help needed moving from lying on your back to sitting on the side of a flat bed without using bedrails?: None Help needed moving to and from a bed to a chair (including a wheelchair)?: None Help needed standing up from a chair using your arms (e.g., wheelchair or bedside chair)?: None Help needed to walk in hospital room?: None Help needed climbing 3-5 steps with a railing? : A Little 6 Click Score: 23    End of Session   Activity Tolerance: Patient tolerated treatment well Patient left: in chair;with call bell/phone within reach Nurse Communication: Mobility status PT Visit Diagnosis: Unsteadiness on feet (R26.81);Other abnormalities of gait and  mobility (R26.89);Muscle weakness (generalized) (M62.81)    Time: 0347-4259 PT Time Calculation (min) (ACUTE ONLY): 20 min   Charges:   PT Evaluation $PT Eval Moderate Complexity: 1 Mod PT Treatments $Therapeutic Activity: 8-22 mins PT General Charges $$ ACUTE PT VISIT: 1 Visit         12:15 PM, 12/24/22 Ocie Bob, MPT Physical Therapist with [facility] 336 [phone] office [phone] mobile phone

## 2022-12-24 NOTE — Evaluation (Signed)
Occupational Therapy Evaluation Patient Details Name: [name] MRN: [id number] DOB: 1945-11-21 Today's Date: 12/24/2022   History of Present Illness [name] is a 77 y.o. female with medical history significant of T2DM, HTN, depression, chronic interstitial cystitis, HLD, COPD, lupus, hypothyroidism, and fibromyalgia who presents to the ED for evaluation of generalized weakness.  She was in her general state of health until 2 weeks ago when she contracted COVID and started having cough and cold symptoms.  She was seen at the urgent care a few days later and diagnosed with UTI.  She was given Macrobid for the UTI but she did not feel any better.  She called her PCP last Friday and she was prescribed doxycycline and Medrol Dosepak for her respiratory symptoms. Her respiratory symptoms have improved somewhat but she continues to have generalized weakness and low energy. She currently endorses generalized bodyaches, severe nausea without vomiting, cough that is occasionally productive, loss of appetite but denies any fevers, abdominal pain, chest pain or shortness of breath. She has not tolerated any p.o. intake the last few days. (per MD)   Clinical Impression   Pt agreeable to OT and PT co-evaluation. Pt appears to be near baseline function with some unsteadiness in standing, but no additional physical assist needed. Pt reports feeling good about going home. Pt was able to don shoes and ambulate about the room without assist. Pt left in bed. Pt is not recommended for further acute OT services and will be discharged to care of nursing staff for remaining length of stay.       If plan is discharge home, recommend the following: Assist for transportation    Functional Status Assessment  Patient has not had a recent decline in their functional status  Equipment Recommendations  None recommended by OT           Precautions / Restrictions Precautions Precautions:  Fall Restrictions Weight Bearing Restrictions: No      Mobility Bed Mobility Overal bed mobility: Independent                  Transfers Overall transfer level: Needs assistance   Transfers: Sit to/from Stand, Bed to chair/wheelchair/BSC Sit to Stand: Independent, Supervision     Step pivot transfers: Independent, Supervision     General transfer comment: Mildly unsteady in standing.      Balance Overall balance assessment: Mild deficits observed, not formally tested                                         ADL either performed or assessed with clinical judgement   ADL Overall ADL's : Independent                                       General ADL Comments: Supervision assist today for safety at times, but pt is able to do basic ADL's without physical assist. Able to ambualte several feet in the room.     Vision Baseline Vision/History: 1 Wears glasses Ability to See in Adequate Light: 1 Impaired Patient Visual Report: No change from baseline (reports increase in floaters) Vision Assessment?: No apparent visual deficits;Wears glasses for reading     Perception Perception: Not tested       Praxis Praxis: Not tested  Pertinent Vitals/Pain Pain Assessment Pain Assessment: No/denies pain     Extremity/Trunk Assessment Upper Extremity Assessment Upper Extremity Assessment: Generalized weakness   Lower Extremity Assessment Lower Extremity Assessment: Defer to PT evaluation   Cervical / Trunk Assessment Cervical / Trunk Assessment: Normal   Communication Communication Communication: No apparent difficulties   Cognition Arousal: Alert Behavior During Therapy: WFL for tasks assessed/performed Overall Cognitive Status: Within Functional Limits for tasks assessed                                                        Home Living Family/patient expects to be discharged to:: Private  residence Living Arrangements: Alone Available Help at Discharge: Family;Available PRN/intermittently (Sister who is also in the hospital.) Type of Home: House Home Access: Stairs to enter [employer] of Steps: 3 Entrance Stairs-Rails: Left (going up) Home Layout: One level     Bathroom Shower/Tub: Tub/shower unit;Walk-in shower   Bathroom Toilet: Handicapped height Bathroom Accessibility: Yes How Accessible: Accessible via wheelchair;Accessible via walker Home Equipment: Cane - quad;Other (comment);Grab bars - tub/shower;Rolling Walker (2 wheels) (hurry cane)          Prior Functioning/Environment Prior Level of Function : Independent/Modified Independent             Mobility Comments: Independent ADLs Comments: Independent                                Co-evaluation PT/OT/SLP Co-Evaluation/Treatment: Yes Reason for Co-Treatment: To address functional/ADL transfers   OT goals addressed during session: ADL's and self-care      AM-PAC OT "6 Clicks" Daily Activity     Outcome Measure Help from another person eating meals?: None Help from another person taking care of personal grooming?: None Help from another person toileting, which includes using toliet, bedpan, or urinal?: None Help from another person bathing (including washing, rinsing, drying)?: None Help from another person to put on and taking off regular upper body clothing?: None Help from another person to put on and taking off regular lower body clothing?: None 6 Click Score: 24   End of Session    Activity Tolerance: Patient tolerated treatment well Patient left: in bed;with call bell/phone within reach  OT Visit Diagnosis: Unsteadiness on feet (R26.81);Other abnormalities of gait and mobility (R26.89);Muscle weakness (generalized) (M62.81)                Time: 1610-9604 OT Time Calculation (min): 11 min Charges:  OT General Charges $OT Visit: 1 Visit OT Evaluation $OT Eval  Low Complexity: 1 Low  Casidy Alberta OT, MOT  Danie Chandler 12/24/2022, 9:06 AM

## 2022-12-25 NOTE — Discharge Summary (Signed)
Physician Discharge Summary   Patient: [name] MRN: [id number] DOB: 1945-12-06  Admit date:     12/22/2022  Discharge date: 12/24/2022  Discharge Physician: Kathlen Mody   PCP: Andi Devon, MD   Recommendations at discharge:  {Please follow up with PCP in one week.  Please follow up with cbc and BMP in one week.   Discharge Diagnoses: Principal Problem:   AKI (acute kidney injury) [facility])    Hospital Course: [name] is a 77 y.o. female with medical history significant of T2DM, HTN, depression, chronic interstitial cystitis, HLD, COPD, lupus, hypothyroidism, and fibromyalgia who presents to the ED for evaluation of generalized weakness.  She was in her general state of health until 2 weeks ago when she contracted COVID and started having cough and cold symptoms.  She was seen at the urgent care a few days later and diagnosed with UTI.  She was given Macrobid for the UTI but she did not feel any better.  She called her PCP last Friday and she was prescribed doxycycline and Medrol Dosepak for her respiratory symptoms.    She was found to be In mild AKI and hyponatremia. Admitted for further evaluation.  Assessment and Plan:    AKI/ Generalized weakness;  Admitted for a creatinine of 1.4 Baseline creatinine of 1 to 1.1 Started on IV fluids,  Improving renal parameters. Creatinine at 1.2 on discharge.      Hyponatremia;  Improved form 129 to 132 To 133      COVID infection S/p medrol dose pack. Duonebs prn.  Pt on RA.  Cough meds.      Hypothyroidism:  Resume synthroid.      Hypertension:  Optimal BP parameters.      Fibromyalgia; Stable.      Type 2 DM, non insulin dependent.     H/o chronic interstitial cystitis.  Stable.          Consultants: none.  Procedures performed: none.   Disposition: Home Diet recommendation:  Discharge Diet Orders (From admission, onward)     Start     Ordered   12/24/22 0000  Diet - low  sodium heart healthy        12/24/22 1042           Regular diet DISCHARGE MEDICATION: Allergies as of 12/24/2022       Reactions   Erythromycin Anaphylaxis   Penicillins Anaphylaxis   Gadolinium Derivatives Hives, Rash   Pt broke out in hives all over body.  Has IV steroids and Benadryl in hospital for MRI.   Iodinated Contrast Media Hives   13-hour prep Pt had slight itching of her abd after her injection and had taken her 13 hr prep.  Consult with radiologist before another procedure to see if IV contrast is necessary, per Dr Roswell Nickel on 01/06/14, JB/   Latex Dermatitis, Other (See Comments)   blisters   Morphine Nausea And Vomiting   Other Itching, Other (See Comments)   Chlorhexin (CHG); "burning"   Prednisone Other (See Comments)   "shakes me out of my frame" IM steroids aren't as bad as PO   Sulfonamide Derivatives Itching, Rash   All over body   Varenicline Tartrate Nausea And Vomiting      Isosorbide Nausea Only   "Short of breath and extremely tired and week also"   Aspirin Nausea Only, Other (See Comments)   Burning in stomach   Atorvastatin Other (See Comments)   System will not process  Medication List     STOP taking these medications    doxycycline 100 MG capsule Commonly known as: VIBRAMYCIN   triamterene-hydrochlorothiazide 37.5-25 MG tablet Commonly known as: MAXZIDE-25       TAKE these medications    albuterol 108 (90 Base) MCG/ACT inhaler Commonly known as: VENTOLIN HFA Inhale 2 puffs into the lungs every 8 (eight) hours as needed (asthma / chest congestion).   ALPRAZolam 0.25 MG tablet Commonly known as: XANAX Take 0.25 mg by mouth 2 (two) times daily as needed for anxiety.   aspirin EC 81 MG tablet Take 1 tablet (81 mg total) by mouth daily. Swallow whole.   benzonatate 200 MG capsule Commonly known as: TESSALON Take 1 capsule (200 mg total) by mouth 3 (three) times daily as needed (intractable cough).   Breztri  Aerosphere 160-9-4.8 MCG/ACT Aero Generic drug: Budeson-Glycopyrrol-Formoterol Inhale 2 puffs into the lungs 2 (two) times daily.   donepezil 5 MG tablet Commonly known as: ARICEPT Take 5 mg by mouth at bedtime.   Enbrel Mini 50 MG/ML injection Generic drug: etanercept Inject 1 mL (50 mg total) into the skin once a week.   guaiFENesin-dextromethorphan 100-10 MG/5ML syrup Commonly known as: ROBITUSSIN DM Take 15 mLs by mouth every 4 (four) hours as needed for cough.   levothyroxine 50 MCG tablet Commonly known as: SYNTHROID Take 50 mcg by mouth daily before breakfast.   metFORMIN 1000 MG tablet Commonly known as: GLUCOPHAGE Take 1 tablet (1,000 mg total) by mouth daily with breakfast.   methylPREDNISolone 8 MG tablet Commonly known as: MEDROL Take 0.5 tablets (4 mg total) by mouth daily for 3 days.   nitroGLYCERIN 0.4 MG SL tablet Commonly known as: NITROSTAT Place 1 tablet (0.4 mg total) under the tongue every 5 (five) minutes x 3 doses as needed for chest pain. If no relief after 3rd dose, proceed to the ED for an evaluation   ondansetron 4 MG tablet Commonly known as: ZOFRAN Take 4 mg by mouth every 8 (eight) hours as needed for nausea or vomiting.   Repatha SureClick 140 MG/ML Soaj Generic drug: Evolocumab Inject 140 mg into the skin every 14 (fourteen) days.   sertraline 100 MG tablet Commonly known as: ZOLOFT Take 100 mg by mouth daily.   Toujeo SoloStar 300 UNIT/ML Solostar Pen Generic drug: insulin glargine (1 Unit Dial) Inject 0-50 Units into the skin See admin instructions. Sliding scale  Under 120=0 Over 120= 10-20 units in the morning Bedtime 20 units if needed, can take up to 50 units depending in Blood glucose  35 units in the morning and 50 units at bedtime.   traMADol 50 MG tablet Commonly known as: ULTRAM Take 50 mg by mouth every 6 (six) hours as needed.   VITAMIN D PO Take 5,000 Units by mouth daily.   zolpidem 5 MG tablet Commonly known  as: AMBIEN Take 5 mg by mouth at bedtime. For sleep        Discharge Exam: Filed Weights   12/22/22 1133 12/22/22 1734  Weight: 53.1 kg 53.8 kg   General exam: Appears calm and comfortable  Respiratory system: Clear to auscultation. Respiratory effort normal. Cardiovascular system: S1 & S2 heard, RRR.  Gastrointestinal system: Abdomen is nondistended, soft and nontender.  Central nervous system: Alert and oriented. No focal neurological deficits. Extremities: Symmetric 5 x 5 power. Skin: No rashes, lesions or ulcers Psychiatry:  Mood & affect appropriate.    Condition at discharge: fair  The results of significant diagnostics  from this hospitalization (including imaging, microbiology, ancillary and laboratory) are listed below for reference.   Imaging Studies: DG Chest Port 1 View  Result Date: 12/22/2022 CLINICAL DATA:  [id number] COVID [id number]. Fever, chills, nausea. History of COPD and asthma. EXAM: PORTABLE CHEST 1 VIEW COMPARISON:  12/10/2022. FINDINGS: Bilateral lungs appear hyperexpanded with coarse bronchovascular markings, in keeping with COPD. Bilateral lungs otherwise appear clear. No dense consolidation or lung collapse. Bilateral costophrenic angles are clear. Normal cardio-mediastinal silhouette. No acute osseous abnormalities. The soft tissues are within normal limits. IMPRESSION: *No radiographic evidence of acute cardiopulmonary disease. COPD. Electronically Signed   By: Jules Schick M.D.   On: 12/22/2022 12:57    Microbiology: Results for orders placed or performed during the hospital encounter of 12/22/22  SARS Coronavirus 2 by RT PCR (hospital order, performed in [facility] hospital lab) *cepheid single result test* Anterior Nasal Swab     Status: Abnormal   Collection Time: 12/22/22 11:40 AM   Specimen: Anterior Nasal Swab  Result Value Ref Range Status   SARS Coronavirus 2 by RT PCR POSITIVE (A) NEGATIVE Final    Comment: (NOTE) SARS-CoV-2 target nucleic  acids are DETECTED  SARS-CoV-2 RNA is generally detectable in upper respiratory specimens  during the acute phase of infection.  Positive results are indicative  of the presence of the identified virus, but do not rule out bacterial infection or co-infection with other pathogens not detected by the test.  Clinical correlation with patient history and  other diagnostic information is necessary to determine patient infection status.  The expected result is negative.  Fact Sheet for Patients:   RoadLapTop.co.za   Fact Sheet for Healthcare Providers:   http://kim-miller.com/    This test is not yet approved or cleared by the [place] FDA and  has been authorized for detection and/or diagnosis of SARS-CoV-2 by FDA under an Emergency Use Authorization (EUA).  This EUA will remain in effect (meaning this test can be used) for the duration of  the COVID-19 declaration under Section 564(b)(1)  of the Act, 21 U.S.C. section 360-bbb-3(b)(1), unless the authorization is terminated or revoked sooner.   Performed at [facility], [address], [place], [name] [address]     Labs: CBC: Recent Labs  Lab 12/22/22 1434  WBC 7.2  NEUTROABS 5.4  HGB 15.0  HCT 44.3  MCV 90.8  PLT 260   Basic Metabolic Panel: Recent Labs  Lab 12/22/22 1434 12/23/22 0508 12/24/22 0524  NA 129* 132* 133*  K 3.6 3.8 3.8  CL 93* 99 98  CO2 25 26 27   GLUCOSE 112* 79 87  BUN 44* 45* 36*  CREATININE 1.48* 1.46* 1.29*  CALCIUM 8.2* 7.8* 8.0*   Liver Function Tests: Recent Labs  Lab 12/22/22 1434  AST 20  ALT 17  ALKPHOS 67  BILITOT 0.7  PROT 7.5  ALBUMIN 3.7   CBG: Recent Labs  Lab 12/23/22 1324 12/23/22 1719 12/23/22 2146 12/24/22 0748 12/24/22 1100  GLUCAP 98 99 171* 104* 64*    Discharge time spent: 40 minutes  Signed: Kathlen Mody, MD [facility] 12/25/2022

## 2023-02-05 NOTE — Progress Notes (Deleted)
Office Visit Note  Patient: [name]             Date of Birth: 05/07/1945           MRN: [id number]             PCP: Andi Devon, MD Referring: Andi Devon, MD Visit Date: 02/19/2023 Occupation: @GUAROCC @  Subjective:  No chief complaint on file.   History of Present Illness: [name] is a 77 y.o. female ***     Activities of Daily Living:  Patient reports morning stiffness for *** {minute/hour:19697}.   Patient {ACTIONS;DENIES/REPORTS:21021675::"Denies"} nocturnal pain.  Difficulty dressing/grooming: {ACTIONS;DENIES/REPORTS:21021675::"Denies"} Difficulty climbing stairs: {ACTIONS;DENIES/REPORTS:21021675::"Denies"} Difficulty getting out of chair: {ACTIONS;DENIES/REPORTS:21021675::"Denies"} Difficulty using hands for taps, buttons, cutlery, and/or writing: {ACTIONS;DENIES/REPORTS:21021675::"Denies"}  No Rheumatology ROS completed.   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   Gastroesophageal reflux disease without esophagitis 11/27/2019   Generalized anxiety disorder 11/27/2019   Hypothyroidism 11/27/2019   Smoker 11/27/2019   Type 2 diabetes mellitus without complication (HCC) 11/27/2019   Varicose veins of lower extremity 11/27/2019   Vitamin D deficiency 11/27/2019   Arthritis of hand 11/05/2019   Inflammation of joint of both hands 11/05/2019   Encounter for orthopedic follow-up care 01/09/2019   Tenosynovitis of fingers 09/22/2018   Tenosynovitis of right wrist 09/22/2018   Pain in right hand 09/08/2018   Nonintractable headache 08/16/2016   Carotid artery tenderness 10/26/2015   Leg pain, left  07/27/2014   Back pain of thoracolumbar region 08/11/2012   Renal cyst 08/04/2012   Cervical spondylosis with radiculopathy 01/24/2012   Chronic interstitial cystitis 11/01/2011   Mixed incontinence 11/01/2011   EMPHYSEMA 08/05/2008   LEUKOCYTOSIS 06/25/2008   COPD 06/25/2008   GASTROPARESIS 06/25/2008   DM 06/14/2008   Hyperlipidemia LDL goal <70 06/14/2008   Essential hypertension 06/14/2008   ASTHMATIC BRONCHITIS, ACUTE 06/14/2008   ALLERGIC RHINITIS 06/14/2008   LUPUS 06/14/2008    Past Medical History:  Diagnosis Date   Arthritis    "little bit qwhere" (01/24/2012)   Asthma    Chronic back pain    "neck to tailbone" (01/24/2012)   COPD (chronic obstructive pulmonary disease) (HCC)    Depression    takes Zoloft daily   Fibromyalgia    History of bladder infections    sees a urologist--on long term Macrodantin nightly   History of blood transfusion    "when I was a child" (01/24/2012)   Hyperlipidemia    takes Simvastatin daily   Hypertension    takes Maxzide daily   Hypothyroidism    takes Synthroid daily   Insomnia    takes Ambien nightly   Lupus    Neck pain    herniated disc and radiculopathy   Peripheral neuropathy    Pneumonia 03/05/2010   "have had it a couple times" (01/24/2012)   Type II diabetes mellitus (HCC)    takes Janumet daily    Family History  Problem Relation Age of Onset   Heart disease Mother 62       Congestive HF, CABG   Arrhythmia Father    Diabetes Sister    Heart disease Sister    Blindness  Brother    Heart attack Brother    Diabetes Brother    Past Surgical History:  Procedure Laterality Date   ANTERIOR CERVICAL DECOMP/DISCECTOMY FUSION  2002; 01/24/2012   ANTERIOR CERVICAL DECOMP/DISCECTOMY FUSION  01/24/2012   Procedure: ANTERIOR CERVICAL DECOMPRESSION/DISCECTOMY FUSION 1 LEVEL;  Surgeon: Cristi Loron, MD;  Location: [facility];  Service: Neurosurgery;  Laterality: N/A;  CERVICAL THREE-FOUR anterior cervical  decompression with fusion interbody prothesis plating and bonegraft   APPENDECTOMY  11/04/1958   BACK SURGERY  03/06/1999   BLADDER SURGERY  03/06/1975   through abdomen   BLADDER SUSPENSION  03/05/1972   BUNIONECTOMY  11/04/1978   left   CARDIAC CATHETERIZATION  03/05/2006   CATARACT EXTRACTION     COLONOSCOPY     CORONARY STENT INTERVENTION N/A 09/01/2021   Procedure: CORONARY STENT INTERVENTION;  Surgeon: Kathleene Hazel, MD;  Location: [facility];  Service: Cardiovascular;  Laterality: N/A;   ESOPHAGOGASTRODUODENOSCOPY     LEFT HEART CATH AND CORONARY ANGIOGRAPHY N/A 09/01/2021   Procedure: LEFT HEART CATH AND CORONARY ANGIOGRAPHY;  Surgeon: Kathleene Hazel, MD;  Location: [facility];  Service: Cardiovascular;  Laterality: N/A;   LUMBAR FUSION  03/06/2007   MOUTH SURGERY     growth removed inside mouth per patient   RECTOCELE REPAIR  03/05/1998   VAGINAL HYSTERECTOMY  03/05/1972   Social History   Social History Narrative   Right Handed    Lives in a one story home    Immunization History  Administered Date(s) Administered   Influenza Split 01/25/2012   Influenza, High Dose Seasonal PF 01/01/2017   Influenza-Unspecified 12/03/2012   Pneumococcal Conjugate-13 12/17/2017   Pneumococcal Polysaccharide-23 01/25/2012   Tdap 04/14/2014     Objective: Vital Signs: There were no vitals taken for this visit.   Physical Exam   Musculoskeletal Exam: ***  CDAI Exam: CDAI Score: -- Patient Global: --; Provider Global: -- Swollen: --; Tender: -- Joint Exam 02/19/2023   No joint exam has been documented for this visit   There is currently no information documented on the homunculus. Go to the Rheumatology activity and complete the homunculus joint exam.  Investigation: No additional findings.  Imaging: No results found.  Recent Labs: Lab Results  Component Value Date   WBC 7.2 12/22/2022   HGB 15.0 12/22/2022   PLT 260 12/22/2022    NA 133 (L) 12/24/2022   K 3.8 12/24/2022   CL 98 12/24/2022   CO2 27 12/24/2022   GLUCOSE 87 12/24/2022   BUN 36 (H) 12/24/2022   CREATININE 1.29 (H) 12/24/2022   BILITOT 0.7 12/22/2022   ALKPHOS 67 12/22/2022   AST 20 12/22/2022   ALT 17 12/22/2022   PROT 7.5 12/22/2022   ALBUMIN 3.7 12/22/2022   CALCIUM 8.0 (L) 12/24/2022   GFRAA 52 (L) 06/16/2020   QFTBGOLDPLUS NEGATIVE 09/19/2022    Speciality Comments: No specialty comments available.  Procedures:  No procedures performed Allergies: Erythromycin, Penicillins, Gadolinium derivatives, Iodinated contrast media, Latex, Morphine, Other, Prednisone, Sulfonamide derivatives, Varenicline tartrate, Isosorbide, Aspirin, and Atorvastatin   Assessment / Plan:     Visit Diagnoses: Seronegative rheumatoid arthritis (HCC)  High risk medication use  Discoid lupus  Digital mucinous cyst  Ganglion cyst  DDD (degenerative disc disease), cervical  Degeneration of intervertebral disc of lumbar region without discogenic back pain or lower extremity pain  Fibromyalgia  History of type 2 diabetes mellitus  History of diabetic gastroparesis  History of COPD  History of asthma  Neuropathy  Essential hypertension  History of hyperlipidemia  History of hypothyroidism  History of depression  Smoker  B12 deficiency  Orders: No orders of the defined types were placed in this encounter.  No orders of the defined types were placed in this encounter.   Face-to-face time spent with patient was *** minutes. Greater than 50% of time was spent in counseling and coordination of care.  Follow-Up Instructions: No follow-ups on file.   Gearldine Bienenstock, PA-C  Note - This record has been created using Dragon software.  Chart creation errors have been sought, but may not always  have been located. Such creation errors do not reflect on  the standard of medical care.

## 2023-02-19 ENCOUNTER — Ambulatory Visit: Payer: Medicare Other | Admitting: Physician Assistant

## 2023-02-19 DIAGNOSIS — M797 Fibromyalgia: Secondary | ICD-10-CM

## 2023-02-19 DIAGNOSIS — M51369 Other intervertebral disc degeneration, lumbar region without mention of lumbar back pain or lower extremity pain: Secondary | ICD-10-CM

## 2023-02-19 DIAGNOSIS — M5030 Other cervical disc degeneration, unspecified cervical region: Secondary | ICD-10-CM

## 2023-02-19 DIAGNOSIS — E538 Deficiency of other specified B group vitamins: Secondary | ICD-10-CM

## 2023-02-19 DIAGNOSIS — M0600 Rheumatoid arthritis without rheumatoid factor, unspecified site: Secondary | ICD-10-CM

## 2023-02-19 DIAGNOSIS — L930 Discoid lupus erythematosus: Secondary | ICD-10-CM

## 2023-02-19 DIAGNOSIS — M67449 Ganglion, unspecified hand: Secondary | ICD-10-CM

## 2023-02-19 DIAGNOSIS — F17200 Nicotine dependence, unspecified, uncomplicated: Secondary | ICD-10-CM

## 2023-02-19 DIAGNOSIS — Z79899 Other long term (current) drug therapy: Secondary | ICD-10-CM

## 2023-02-19 DIAGNOSIS — Z8639 Personal history of other endocrine, nutritional and metabolic disease: Secondary | ICD-10-CM

## 2023-02-19 DIAGNOSIS — I10 Essential (primary) hypertension: Secondary | ICD-10-CM

## 2023-02-19 DIAGNOSIS — Z8709 Personal history of other diseases of the respiratory system: Secondary | ICD-10-CM

## 2023-02-19 DIAGNOSIS — G629 Polyneuropathy, unspecified: Secondary | ICD-10-CM

## 2023-02-19 DIAGNOSIS — Z8659 Personal history of other mental and behavioral disorders: Secondary | ICD-10-CM

## 2023-02-19 DIAGNOSIS — M6740 Ganglion, unspecified site: Secondary | ICD-10-CM

## 2023-03-07 NOTE — Progress Notes (Signed)
Office Visit Note  Patient: [name]             Date of Birth: October 30, 1945           MRN: [id number]             PCP: Andi Devon, MD Referring: Andi Devon, MD Visit Date: 03/21/2023 Occupation: @GUAROCC @  Subjective:  Fatigue   History of Present Illness: [name] is a 78 y.o. female with history of seronegative rheumatoid arthritis and DDD.  Patient remains on  Enbrel 50 mg sq injections once weekly-started on 11/05/19.  Patient continues to tolerate Enbrel without any side effects or injection site reactions.  Patient reports that she was diagnosed with COVID-19 in October 2024.  She was hospitalized for 4 days at [facility] and during that time held Enbrel.  Patient reports that she resumed Enbrel in mid November 2024.  Patient reports that she has been grieving the loss of her sister who was also hospitalized at the same time with COVID-19.  Patient reports that she has been diagnosed with long COVID and has been following up with her PCP.  She continues to have ongoing fatigue as well as appetite loss.  She has been trying to gain weight by increasing her protein intake as encouraged by her PCP. Patient continues to experience intermittent arthralgias and joint stiffness.  She denies any joint swelling at this time.  She had a ganglion cyst removed from the left wrist by Dr. Juanetta Gosling recurrence.    Activities of Daily Living:  Patient reports morning stiffness for 30-40 minutes.   Patient Reports nocturnal pain.  Difficulty dressing/grooming: Reports Difficulty climbing stairs: Reports Difficulty getting out of chair: Reports Difficulty using hands for taps, buttons, cutlery, and/or writing: Reports  Review of Systems  Constitutional:  Positive for fatigue.  HENT:  Positive for mouth dryness. Negative for mouth sores.   Eyes:  Positive for dryness.  Respiratory:  Positive for shortness of breath.   Cardiovascular:  Negative for chest pain and  palpitations.  Gastrointestinal:  Positive for diarrhea. Negative for blood in stool and constipation.  Endocrine: Positive for increased urination.  Genitourinary:  Positive for involuntary urination.  Musculoskeletal:  Positive for joint pain, gait problem, joint pain, joint swelling, muscle weakness, morning stiffness and muscle tenderness. Negative for myalgias and myalgias.  Skin:  Positive for hair loss and sensitivity to sunlight. Negative for color change and rash.  Allergic/Immunologic: Positive for susceptible to infections.  Neurological:  Negative for dizziness and headaches.  Hematological:  Negative for swollen glands.  Psychiatric/Behavioral:  Negative for depressed mood and sleep disturbance. The patient is not nervous/anxious.     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   Gastroesophageal reflux disease without esophagitis 11/27/2019   Generalized anxiety disorder 11/27/2019   Hypothyroidism 11/27/2019   Smoker 11/27/2019   Type 2 diabetes mellitus without complication (HCC) 11/27/2019   Varicose veins of lower extremity 11/27/2019   Vitamin D deficiency 11/27/2019   Arthritis of hand 11/05/2019   Inflammation of joint of both hands 11/05/2019   Encounter for orthopedic follow-up care 01/09/2019   Tenosynovitis of fingers 09/22/2018  Tenosynovitis of right wrist 09/22/2018   Pain in right hand 09/08/2018   Nonintractable headache 08/16/2016   Carotid artery tenderness 10/26/2015   Leg pain, left 07/27/2014   Back pain of thoracolumbar region 08/11/2012   Renal cyst 08/04/2012   Cervical spondylosis with radiculopathy 01/24/2012    Chronic interstitial cystitis 11/01/2011   Mixed incontinence 11/01/2011   EMPHYSEMA 08/05/2008   LEUKOCYTOSIS 06/25/2008   COPD 06/25/2008   GASTROPARESIS 06/25/2008   DM 06/14/2008   Hyperlipidemia LDL goal <70 06/14/2008   Essential hypertension 06/14/2008   ASTHMATIC BRONCHITIS, ACUTE 06/14/2008   ALLERGIC RHINITIS 06/14/2008   LUPUS 06/14/2008    Past Medical History:  Diagnosis Date   Arthritis    "little bit qwhere" (01/24/2012)   Asthma    Chronic back pain    "neck to tailbone" (01/24/2012)   COPD (chronic obstructive pulmonary disease) (HCC)    COVID 12/2022   Depression    takes Zoloft daily   Fibromyalgia    History of bladder infections    sees a urologist--on long term Macrodantin nightly   History of blood transfusion    "when I was a child" (01/24/2012)   Hyperlipidemia    takes Simvastatin daily   Hypertension    takes Maxzide daily   Hypothyroidism    takes Synthroid daily   Insomnia    takes Ambien nightly   Lupus    Neck pain    herniated disc and radiculopathy   Peripheral neuropathy    Pneumonia 03/05/2010   "have had it a couple times" (01/24/2012)   Post-COVID syndrome    Type II diabetes mellitus (HCC)    takes Janumet daily    Family History  Problem Relation Age of Onset   Heart disease Mother 73       Congestive HF, CABG   Arrhythmia Father    Diabetes Sister    Heart disease Sister    Blindness Brother    Heart attack Brother    Diabetes Brother    Past Surgical History:  Procedure Laterality Date   ANTERIOR CERVICAL DECOMP/DISCECTOMY FUSION  2002; 01/24/2012   ANTERIOR CERVICAL DECOMP/DISCECTOMY FUSION  01/24/2012   Procedure: ANTERIOR CERVICAL DECOMPRESSION/DISCECTOMY FUSION 1 LEVEL;  Surgeon: Cristi Loron, MD;  Location: [facility];  Service: Neurosurgery;  Laterality: N/A;  CERVICAL THREE-FOUR anterior cervical decompression with fusion interbody prothesis plating and bonegraft   APPENDECTOMY  11/04/1958   BACK  SURGERY  03/06/1999   BLADDER SURGERY  03/06/1975   through abdomen   BLADDER SUSPENSION  03/05/1972   BUNIONECTOMY  11/04/1978   left   CARDIAC CATHETERIZATION  03/05/2006   CATARACT EXTRACTION     COLONOSCOPY     CORONARY STENT INTERVENTION N/A 09/01/2021   Procedure: CORONARY STENT INTERVENTION;  Surgeon: Kathleene Hazel, MD;  Location: [facility];  Service: Cardiovascular;  Laterality: N/A;   ESOPHAGOGASTRODUODENOSCOPY     LEFT HEART CATH AND CORONARY ANGIOGRAPHY N/A 09/01/2021   Procedure: LEFT HEART CATH AND CORONARY ANGIOGRAPHY;  Surgeon: Kathleene Hazel, MD;  Location: [facility];  Service: Cardiovascular;  Laterality: N/A;   LUMBAR FUSION  03/06/2007   MOUTH SURGERY     growth removed inside mouth per patient   RECTOCELE REPAIR  03/05/1998   VAGINAL HYSTERECTOMY  03/05/1972   Social History   Social History Narrative   Right Handed    Lives in a one story home    Immunization History  Administered Date(s) Administered  Influenza Split 01/25/2012   Influenza, High Dose Seasonal PF 01/01/2017   Influenza-Unspecified 12/03/2012   Pneumococcal Conjugate-13 12/17/2017   Pneumococcal Polysaccharide-23 01/25/2012   Tdap 04/14/2014     Objective: Vital Signs: BP (!) 152/72 (BP Location: Right Arm, Patient Position: Sitting, Cuff Size: Normal)   Pulse 61   Resp 14   Ht 5\' 5"  (1.651 m)   Wt 114 lb (51.7 kg)   BMI 18.97 kg/m    Physical Exam Vitals and nursing note reviewed.  Constitutional:      Appearance: She is well-developed.  HENT:     Head: Normocephalic and atraumatic.  Eyes:     Conjunctiva/sclera: Conjunctivae normal.  Cardiovascular:     Rate and Rhythm: Normal rate and regular rhythm.     Heart sounds: Normal heart sounds.  Pulmonary:     Effort: Pulmonary effort is normal.     Breath sounds: Wheezing present.  Abdominal:     General: Bowel sounds are normal.     Palpations: Abdomen is soft.  Musculoskeletal:      Cervical back: Normal range of motion.  Skin:    General: Skin is warm and dry.     Capillary Refill: Capillary refill takes less than 2 seconds.  Neurological:     Mental Status: She is alert and oriented to person, place, and time.  Psychiatric:        Behavior: Behavior normal.      Musculoskeletal Exam: C-spine has limited range of motion without rotation.  Painful limited mobility of the lumbar spine.  Shoulder joints, elbow joints, and wrist joints have good range of motion.  Tenderness of the right CMC joint with prominence noted.  No tenderness or synovitis over MCP joints.  Complete fist formation bilaterally.  Hip joints have slightly limited range of motion.  Knee joints have good range of motion no warmth or effusion.  Dorsal spurs noted on bilateral feet.  Ankle joints have good range of motion with no tenderness or joint swelling.  CDAI Exam: CDAI Score: -- Patient Global: 50 / 100; Provider Global: 50 / 100 Swollen: --; Tender: -- Joint Exam 03/21/2023   No joint exam has been documented for this visit   There is currently no information documented on the homunculus. Go to the Rheumatology activity and complete the homunculus joint exam.  Investigation: No additional findings.  Imaging: No results found.  Recent Labs: Lab Results  Component Value Date   WBC 7.2 12/22/2022   HGB 15.0 12/22/2022   PLT 260 12/22/2022   NA 133 (L) 12/24/2022   K 3.8 12/24/2022   CL 98 12/24/2022   CO2 27 12/24/2022   GLUCOSE 87 12/24/2022   BUN 36 (H) 12/24/2022   CREATININE 1.29 (H) 12/24/2022   BILITOT 0.7 12/22/2022   ALKPHOS 67 12/22/2022   AST 20 12/22/2022   ALT 17 12/22/2022   PROT 7.5 12/22/2022   ALBUMIN 3.7 12/22/2022   CALCIUM 8.0 (L) 12/24/2022   GFRAA 52 (L) 06/16/2020   QFTBGOLDPLUS NEGATIVE 09/19/2022    Speciality Comments: No specialty comments available.  Procedures:  No procedures performed Allergies: Erythromycin, Penicillins, Gadolinium  derivatives, Iodinated contrast media, Latex, Morphine, Other, Prednisone, Sulfonamide derivatives, Varenicline tartrate, Isosorbide, Aspirin, and Atorvastatin    Assessment / Plan:     Visit Diagnoses: Seronegative rheumatoid arthritis (HCC): She has no synovitis on examination today.  She has not had any signs or symptoms of a rheumatoid arthritis flare.  She continues to experience intermittent arthralgias  and joint stiffness due to underlying osteoarthritis.  Overall her rheumatoid arthritis remains well-controlled on Enbrel 50 mg subcu days injections once weekly.  She is tolerating Enbrel without any side effects or injection site reactions.  Patient had a gap in therapy in October to mid November while recovering from COVID-19.  She was hospitalized for 4 days in October and has continued to suffer from long covid.   No medication changes will be made at this time. A sample of enbrel was provided today and the enrollment paperwork was completed while in the office.  She was advised to notify us if she develops signs or symptoms of a flare.  She will follow-up in the office in 5 months or sooner if needed.  High risk medication use - Enbrel 50 mg sq injections once weekly-started on 11/05/19.  (D/c MTX 0.4 ml sq wkly inj-low dose- elevated creatinine and recurrent bronchitis).  BMP updated on 12/24/22.  Orders for CBC and CMP released today.  Her next lab work will be due in April and every 3 months to monitor for drug toxicity. TB gold negative on 09/19/22. Discussed the importance of holding Enbrel if she develops signs or symptoms of an infection and to resume once the infection has completely cleared. 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   Digital mucinous cyst: Surgically excised by Dr. Juanetta Gosling recurrence.   Ganglion cyst - Volar aspect of left wrist- Dr. Godfrey Pick excised. No recurrence.   DDD (degenerative  disc disease), cervical: C-spine has limited range of motion with lateral Tatian.  Degeneration of intervertebral disc of lumbar region without discogenic back pain or lower extremity pain - Dr. Hermelinda Medicus pain and limited mobility.   Fibromyalgia: She continues to experience intermittent myalgias and arthralgias particularly with weather changes.  She takes tramadol sparingly for moderate to severe pain relief.  Other medical conditions are listed as follows:  History of type 2 diabetes mellitus  History of diabetic gastroparesis  Chronic diarrhea  History of COPD  History of asthma  Neuropathy  Essential hypertension: Blood pressure was 132/72 today in the office.  Patient was advised to monitor blood pressure closely and to return to PCP if remains elevated.  History of hyperlipidemia  History of hypothyroidism  History of depression  Smoker  B12 deficiency  Orders: Orders Placed This Encounter  Procedures   CBC with Differential/Platelet   COMPLETE METABOLIC PANEL WITH GFR   No orders of the defined types were placed in this encounter.    Follow-Up Instructions: Return in about 5 months (around 08/19/2023) for Rheumatoid arthritis, DDD.   Gearldine Bienenstock, PA-C  Note - This record has been created using Dragon software.  Chart creation errors have been sought, but may not always  have been located. Such creation errors do not reflect on  the standard of medical care.

## 2023-03-14 ENCOUNTER — Other Ambulatory Visit (HOSPITAL_COMMUNITY): Payer: Self-pay

## 2023-03-21 ENCOUNTER — Telehealth: Payer: Self-pay | Admitting: Pharmacist

## 2023-03-21 ENCOUNTER — Ambulatory Visit: Payer: Medicare Other | Attending: Physician Assistant | Admitting: Physician Assistant

## 2023-03-21 ENCOUNTER — Encounter: Payer: Self-pay | Admitting: Physician Assistant

## 2023-03-21 VITALS — BP 152/72 | HR 61 | Resp 14 | Ht 65.0 in | Wt 114.0 lb

## 2023-03-21 DIAGNOSIS — M67449 Ganglion, unspecified hand: Secondary | ICD-10-CM

## 2023-03-21 DIAGNOSIS — Z79899 Other long term (current) drug therapy: Secondary | ICD-10-CM | POA: Diagnosis not present

## 2023-03-21 DIAGNOSIS — M5030 Other cervical disc degeneration, unspecified cervical region: Secondary | ICD-10-CM

## 2023-03-21 DIAGNOSIS — M0600 Rheumatoid arthritis without rheumatoid factor, unspecified site: Secondary | ICD-10-CM | POA: Diagnosis not present

## 2023-03-21 DIAGNOSIS — M51369 Other intervertebral disc degeneration, lumbar region without mention of lumbar back pain or lower extremity pain: Secondary | ICD-10-CM

## 2023-03-21 DIAGNOSIS — M797 Fibromyalgia: Secondary | ICD-10-CM

## 2023-03-21 DIAGNOSIS — E538 Deficiency of other specified B group vitamins: Secondary | ICD-10-CM

## 2023-03-21 DIAGNOSIS — L930 Discoid lupus erythematosus: Secondary | ICD-10-CM | POA: Diagnosis not present

## 2023-03-21 DIAGNOSIS — Z8709 Personal history of other diseases of the respiratory system: Secondary | ICD-10-CM

## 2023-03-21 DIAGNOSIS — K529 Noninfective gastroenteritis and colitis, unspecified: Secondary | ICD-10-CM

## 2023-03-21 DIAGNOSIS — F17200 Nicotine dependence, unspecified, uncomplicated: Secondary | ICD-10-CM

## 2023-03-21 DIAGNOSIS — Z8639 Personal history of other endocrine, nutritional and metabolic disease: Secondary | ICD-10-CM

## 2023-03-21 DIAGNOSIS — M6740 Ganglion, unspecified site: Secondary | ICD-10-CM

## 2023-03-21 DIAGNOSIS — Z8659 Personal history of other mental and behavioral disorders: Secondary | ICD-10-CM

## 2023-03-21 DIAGNOSIS — I10 Essential (primary) hypertension: Secondary | ICD-10-CM

## 2023-03-21 DIAGNOSIS — G629 Polyneuropathy, unspecified: Secondary | ICD-10-CM

## 2023-03-21 NOTE — Progress Notes (Signed)
Medication Samples have been provided to the patient.  Drug name: Enbrel Mini 50mg /mL cartridge  Qty: 1  LOT: [id number]  Exp.Date: 05/02/2024  Dosing instructions: Inject on Sunday, 03/24/2023. DO NOT REFRIGERATE  The patient has been instructed regarding the correct time, dose, and frequency of taking this medication, including desired effects and most common side effects.   Murrell Redden 10:59 AM 03/21/2023

## 2023-03-21 NOTE — Patient Instructions (Signed)
Standing Labs We placed an order today for your standing lab work.   Please have your standing labs drawn in April and every 3 months   Please have your labs drawn 2 weeks prior to your appointment so that the provider can discuss your lab results at your appointment, if possible.  Please note that you may see your imaging and lab results in MyChart before we have reviewed them. We will contact you once all results are reviewed. Please allow our office up to 72 hours to thoroughly review all of the results before contacting the office for clarification of your results.  WALK-IN LAB HOURS  Monday through Thursday from 8:00 am -12:30 pm and 1:00 pm-5:00 pm and Friday from 8:00 am-12:00 pm.  Patients with office visits requiring labs will be seen before walk-in labs.  You may encounter longer than normal wait times. Please allow additional time. Wait times may be shorter on  Monday and Thursday afternoons.  We do not book appointments for walk-in labs. We appreciate your patience and understanding with our staff.   Labs are drawn by [facility]. Please bring your co-pay at the time of your lab draw.  You may receive a bill from [facility] for your lab work.  Please note if you are on Hydroxychloroquine and and an order has been placed for a Hydroxychloroquine level,  you will need to have it drawn 4 hours or more after your last dose.  If you wish to have your labs drawn at another location, please call the office 24 hours in advance so we can fax the orders.  The office is located at [address], [address], [place], [place] [address]   If you have any questions regarding directions or hours of operation,  please call [phone].   As a reminder, please drink plenty of water prior to coming for your lab work. Thanks!  

## 2023-03-21 NOTE — Telephone Encounter (Signed)
Patient had OV today and completed Enbrel PAP renewal application. Provider form signed by [name] PA-C  Submitted a Prior Authorization request to [facility] for ENBREL via CoverMyMeds. Will update once we receive a response.  Key: [id number]  Per automated response: This medication or product was previously approved on [id number] from 2023-03-06 to 2024-03-04. Providers contact us at [phone] for further assistance.  Chesley Mires, PharmD, MPH, BCPS, CPP Clinical Pharmacist (Rheumatology and Pulmonology)

## 2023-03-21 NOTE — Telephone Encounter (Signed)
 Submitted Patient Assistance RENEWAL Application to [employer] for ENBREL along with provider portion, patient portion, PA, medication list, insurance card copy \]. Will update patient when we receive a response.  Phone #: [phone] Fax #: [phone]

## 2023-03-22 LAB — CBC WITH DIFFERENTIAL/PLATELET
Absolute Lymphocytes: 1926 {cells}/uL (ref 850–3900)
Absolute Monocytes: 546 {cells}/uL (ref 200–950)
Basophils Absolute: 107 {cells}/uL (ref 0–200)
Basophils Relative: 1 %
Eosinophils Absolute: 1198 {cells}/uL — ABNORMAL HIGH (ref 15–500)
Eosinophils Relative: 11.2 %
HCT: 41.7 % (ref 35.0–45.0)
Hemoglobin: 13.7 g/dL (ref 11.7–15.5)
MCH: 30.4 pg (ref 27.0–33.0)
MCHC: 32.9 g/dL (ref 32.0–36.0)
MCV: 92.5 fL (ref 80.0–100.0)
MPV: 10 fL (ref 7.5–12.5)
Monocytes Relative: 5.1 %
Neutro Abs: 6923 {cells}/uL (ref 1500–7800)
Neutrophils Relative %: 64.7 %
Platelets: 248 10*3/uL (ref 140–400)
RBC: 4.51 10*6/uL (ref 3.80–5.10)
RDW: 12.5 % (ref 11.0–15.0)
Total Lymphocyte: 18 %
WBC: 10.7 10*3/uL (ref 3.8–10.8)

## 2023-03-22 LAB — COMPLETE METABOLIC PANEL WITH GFR
AG Ratio: 1.4 (calc) (ref 1.0–2.5)
ALT: 7 U/L (ref 6–29)
AST: 12 U/L (ref 10–35)
Albumin: 4.3 g/dL (ref 3.6–5.1)
Alkaline phosphatase (APISO): 64 U/L (ref 37–153)
BUN: 22 mg/dL (ref 7–25)
CO2: 27 mmol/L (ref 20–32)
Calcium: 9.3 mg/dL (ref 8.6–10.4)
Chloride: 99 mmol/L (ref 98–110)
Creat: 0.9 mg/dL (ref 0.60–1.00)
Globulin: 3 g/dL (ref 1.9–3.7)
Glucose, Bld: 95 mg/dL (ref 65–99)
Potassium: 3.9 mmol/L (ref 3.5–5.3)
Sodium: 137 mmol/L (ref 135–146)
Total Bilirubin: 0.4 mg/dL (ref 0.2–1.2)
Total Protein: 7.3 g/dL (ref 6.1–8.1)
eGFR: 65 mL/min/{1.73_m2} (ref >=60)

## 2023-03-22 NOTE — Progress Notes (Signed)
CMP WNL-renal function has improved significantly.   Absolute eosinophils are elevated--can be elevated with allergies.  Her eosinophils have been elevated off and on since march 2024. We will continue to monitor and if they remain elevated she may need further evaluation. Rest of CBC WNL.

## 2023-04-04 NOTE — Telephone Encounter (Signed)
Received a fax from  [employer] regarding an approval for ENBREL patient assistance from 04/03/23 to 03/04/24. Approval letter sent to scan center.  Phone #: [phone] Fax #: [phone]  Chesley Mires, PharmD, MPH, BCPS, CPP Clinical Pharmacist (Rheumatology and Pulmonology)

## 2023-04-17 ENCOUNTER — Other Ambulatory Visit: Payer: Self-pay | Admitting: *Deleted

## 2023-04-17 MED ORDER — ENBREL MINI 50 MG/ML ~~LOC~~ SOCT: 50.0000 mg | SUBCUTANEOUS | 0 refills | Status: DC

## 2023-04-17 NOTE — Telephone Encounter (Signed)
Refill request received via fax from [facility] for Enbrel  Last Fill: 11/29/2022  Labs: 03/21/2023 CMP WNL-renal function has improved significantly.   Absolute eosinophils are elevated--can be elevated with allergies.  Her eosinophils have been elevated off and on since march 2024. We will continue to monitor and if they remain elevated she may need further evaluation. Rest of CBC WNL.    TB Gold: 09/19/2022 Neg    Next Visit: 08/19/2023  Last Visit: 03/21/2023  WJ:[id number] rheumatoid arthritis   Current Dose per office note 03/21/2023: Enbrel 50 mg sq injections once weekly   Okay to refill Enbrel?

## 2023-06-05 ENCOUNTER — Other Ambulatory Visit: Payer: Self-pay

## 2023-06-05 ENCOUNTER — Other Ambulatory Visit (HOSPITAL_COMMUNITY): Payer: Self-pay

## 2023-07-18 NOTE — Progress Notes (Shared)
 [facility] - Clinic Note  07/30/2023     CHIEF COMPLAINT Patient presents for No chief complaint on file.   HISTORY OF PRESENT ILLNESS: [name] is a 78 y.o. female who presents to the clinic today for:     Pt states vision is stable, she states she has a floater in both eyes, but has not seen any fol   Referring physician: Yolanda Hence, MD [address] [place],  [name] [address]  HISTORICAL INFORMATION:   Selected notes from the MEDICAL RECORD NUMBER Self referral for DEE -- pt of Dr. Candi Chafe [name]:  Ocular Hx- PMH-    CURRENT MEDICATIONS: No current outpatient medications on file. (Ophthalmic Drugs)   No current facility-administered medications for this visit. (Ophthalmic Drugs)   Current Outpatient Medications (Other)  Medication Sig   albuterol  (VENTOLIN  HFA) 108 (90 Base) MCG/ACT inhaler Inhale 2 puffs into the lungs every 8 (eight) hours as needed (asthma / chest congestion).   ALPRAZolam  (XANAX ) 0.25 MG tablet Take 0.25 mg by mouth 2 (two) times daily as needed for anxiety.   aspirin  EC 81 MG tablet Take 1 tablet (81 mg total) by mouth daily. Swallow whole. (Patient not taking: Reported on 03/21/2023)   benzonatate  (TESSALON ) 200 MG capsule Take 1 capsule (200 mg total) by mouth 3 (three) times daily as needed (intractable cough).   BREZTRI AEROSPHERE 160-9-4.8 MCG/ACT AERO Inhale 2 puffs into the lungs 2 (two) times daily.   donepezil  (ARICEPT ) 5 MG tablet Take 5 mg by mouth at bedtime.   etanercept  (ENBREL  MINI) 50 MG/ML injection Inject 1 mL (50 mg total) into the skin once a week.   Evolocumab  (REPATHA  SURECLICK) 140 MG/ML SOAJ Inject 140 mg into the skin every 14 (fourteen) days.   guaiFENesin -dextromethorphan  (ROBITUSSIN DM) 100-10 MG/5ML syrup Take 15 mLs by mouth every 4 (four) hours as needed for cough.   insulin  glargine, 1 Unit Dial, (TOUJEO SOLOSTAR) 300 UNIT/ML Solostar Pen Inject 0-50 Units into the skin  See admin instructions. Sliding scale  Under 120=0 Over 120= 10-20 units in the morning Bedtime 20 units if needed, can take up to 50 units depending in Blood glucose  35 units in the morning and 50 units at bedtime.   levothyroxine  (SYNTHROID , LEVOTHROID) 50 MCG tablet Take 50 mcg by mouth daily before breakfast.   metFORMIN  (GLUCOPHAGE ) 1000 MG tablet Take 1 tablet (1,000 mg total) by mouth daily with breakfast.   nitroGLYCERIN  (NITROSTAT ) 0.4 MG SL tablet Place 1 tablet (0.4 mg total) under the tongue every 5 (five) minutes x 3 doses as needed for chest pain. If no relief after 3rd dose, proceed to the ED for an evaluation   ondansetron  (ZOFRAN ) 4 MG tablet Take 4 mg by mouth every 8 (eight) hours as needed for nausea or vomiting.   ondansetron  (ZOFRAN -ODT) 8 MG disintegrating tablet Place 1 tablet twice a day by translingual route as needed.   ONETOUCH VERIO test strip 1 each 4 (four) times daily.   sertraline  (ZOLOFT ) 100 MG tablet Take 100 mg by mouth daily.    traMADol  (ULTRAM ) 50 MG tablet Take 50 mg by mouth every 6 (six) hours as needed.   triamterene -hydrochlorothiazide  (MAXZIDE -25) 37.5-25 MG tablet Take 1 tablet by mouth daily.   VITAMIN D PO Take 5,000 Units by mouth daily.   zolpidem  (AMBIEN ) 5 MG tablet Take 5 mg by mouth at bedtime. For sleep   No current facility-administered medications for this visit. (Other)  REVIEW OF SYSTEMS:    ALLERGIES Allergies  Allergen Reactions   Erythromycin Anaphylaxis   Penicillins Anaphylaxis   Gadolinium Derivatives Hives and Rash    Pt broke out in hives all over body.  Has IV steroids and Benadryl  in hospital for MRI.   Iodinated Contrast Media Hives    13-hour prep Pt had slight itching of her abd after her injection and had taken her 13 hr prep.  Consult with radiologist before another procedure to see if IV contrast is necessary, per Dr Meribeth Standard on 01/06/14, JB/   Latex Dermatitis and Other (See Comments)    blisters    Morphine Nausea And Vomiting   Other Itching and Other (See Comments)    Chlorhexin (CHG); "burning"   Prednisone  Other (See Comments)    "shakes me out of my frame" IM steroids aren't as bad as PO   Sulfonamide Derivatives Itching and Rash    All over body    Varenicline Tartrate Nausea And Vomiting        Isosorbide  Nausea Only    "Short of breath and extremely tired and week also"   Aspirin  Nausea Only and Other (See Comments)    Burning in stomach    Atorvastatin Other (See Comments)    System will not process   PAST MEDICAL HISTORY Past Medical History:  Diagnosis Date   Arthritis    "little bit qwhere" (01/24/2012)   Asthma    Chronic back pain    "neck to tailbone" (01/24/2012)   COPD (chronic obstructive pulmonary disease) (HCC)    COVID 12/2022   Depression    takes Zoloft  daily   Fibromyalgia    History of bladder infections    sees a urologist--on long term Macrodantin  nightly   History of blood transfusion    "when I was a child" (01/24/2012)   Hyperlipidemia    takes Simvastatin  daily   Hypertension    takes Maxzide  daily   Hypothyroidism    takes Synthroid  daily   Insomnia    takes Ambien  nightly   Lupus    Neck pain    herniated disc and radiculopathy   Peripheral neuropathy    Pneumonia 03/05/2010   "have had it a couple times" (01/24/2012)   Post-COVID syndrome    Type II diabetes mellitus (HCC)    takes Janumet  daily   Past Surgical History:  Procedure Laterality Date   ANTERIOR CERVICAL DECOMP/DISCECTOMY FUSION  2002; 01/24/2012   ANTERIOR CERVICAL DECOMP/DISCECTOMY FUSION  01/24/2012   Procedure: ANTERIOR CERVICAL DECOMPRESSION/DISCECTOMY FUSION 1 LEVEL;  Surgeon: Elder Greening, MD;  Location: [facility];  Service: Neurosurgery;  Laterality: N/A;  CERVICAL THREE-FOUR anterior cervical decompression with fusion interbody prothesis plating and bonegraft   APPENDECTOMY  11/04/1958   BACK SURGERY  03/06/1999   BLADDER SURGERY   03/06/1975   through abdomen   BLADDER SUSPENSION  03/05/1972   BUNIONECTOMY  11/04/1978   left   CARDIAC CATHETERIZATION  03/05/2006   CATARACT EXTRACTION     COLONOSCOPY     CORONARY STENT INTERVENTION N/A 09/01/2021   Procedure: CORONARY STENT INTERVENTION;  Surgeon: Odie Benne, MD;  Location: [facility];  Service: Cardiovascular;  Laterality: N/A;   ESOPHAGOGASTRODUODENOSCOPY     LEFT HEART CATH AND CORONARY ANGIOGRAPHY N/A 09/01/2021   Procedure: LEFT HEART CATH AND CORONARY ANGIOGRAPHY;  Surgeon: Odie Benne, MD;  Location: [facility];  Service: Cardiovascular;  Laterality: N/A;   LUMBAR FUSION  03/06/2007  MOUTH SURGERY     growth removed inside mouth per patient   RECTOCELE REPAIR  03/05/1998   VAGINAL HYSTERECTOMY  03/05/1972   FAMILY HISTORY Family History  Problem Relation Age of Onset   Heart disease Mother 68       Congestive HF, CABG   Arrhythmia Father    Diabetes Sister    Heart disease Sister    Blindness Brother    Heart attack Brother    Diabetes Brother    SOCIAL HISTORY Social History   Tobacco Use   Smoking status: Every Day    Current packs/day: 1.00    Average packs/day: 1 pack/day for 60.0 years (60.0 ttl pk-yrs)    Types: Cigarettes    Passive exposure: Never   Smokeless tobacco: Never  Vaping Use   Vaping status: Former  Substance Use Topics   Alcohol use: No   Drug use: No       OPHTHALMIC EXAM: Not recorded     IMAGING AND PROCEDURES  Imaging and Procedures for 07/30/2023          ASSESSMENT/PLAN:    ICD-10-CM   1. Diabetes mellitus type 2 without retinopathy (HCC)  E11.9     2. Encounter for long-term (current) use of insulin  (HCC)  Z79.4     3. Diabetes mellitus treated with oral medication (HCC)  E11.9    Z79.84     4. Intermediate stage nonexudative age-related macular degeneration of both eyes  H35.3132     5. Hypertensive retinopathy of both eyes  H35.033     6.  Essential hypertension  I10     7. Posterior vitreous detachment of both eyes  H43.813     8. Pseudophakia of both eyes  Z96.1       1-3. Diabetes mellitus, type 2 without retinopathy  - exam with no MA or heme OU - The incidence, risk factors for progression, natural history and treatment options for diabetic retinopathy  were discussed with patient.   - The need for close monitoring of blood glucose, blood pressure, and serum lipids, avoiding cigarette or any type of tobacco, and the need for long term follow up was also discussed with patient. - f/u 6-9 months, sooner PRN -- DFE/OCT  4. Age related macular degeneration, non-exudative, both eyes  - intermediate stage with near confluent drusen and early atrophy OU -- stable  - no exudative disease on exam or OCT OU  - discussed ARMD as the most likely cause of visual symptoms  - The incidence, anatomy, and pathology of dry AMD, risk of progression, and the AREDS and AREDS 2 study including smoking risks discussed with patient.  - Recommend amsler grid monitoring  - f/u in 6-9 mos  5,6. Hypertensive retinopathy OU - discussed importance of tight BP control - monitor  7. PVD OU  - subacute symptomatic floaters, onset Sunday night 8.13.23  - subjective increase in floaters -- exam stable  - OCT shows interval release of VMA to full PVD OS  - Discussed findings and prognosis - No RT or RD on 360 peripheral exam OU - Reviewed s/s of RT/RD - Strict return precautions for any such RT/RD signs/symptoms - monitor  8. Pseudophakia OU  - s/p CE/IOL OU (R. Groat)  - IOLs in good position, doing well  - monitor  Ophthalmic Meds Ordered this visit:  No orders of the defined types were placed in this encounter.    No follow-ups on file.  There are no  Patient Instructions on file for this visit.   Explained the diagnoses, plan, and follow up with the patient and they expressed understanding.  Patient expressed understanding of the  importance of proper follow up care.   This document serves as a record of services personally performed by Jeanice Millard, MD, PhD. It was created on their behalf by Arn Lane, COT an ophthalmic technician. The creation of this record is the provider's dictation and/or activities during the visit.    Electronically signed by:  Olene Berne, COT  07/18/23 9:01 AM  Jeanice Millard, M.D., Ph.D. Diseases & Surgery of the Retina and Vitreous [facility]    Abbreviations: M myopia (nearsighted); A astigmatism; H hyperopia (farsighted); P presbyopia; Mrx spectacle prescription;  CTL contact lenses; OD right eye; OS left eye; OU both eyes  XT exotropia; ET esotropia; PEK punctate epithelial keratitis; PEE punctate epithelial erosions; DES dry eye syndrome; MGD meibomian gland dysfunction; ATs artificial tears; PFAT's preservative free artificial tears; NSC nuclear sclerotic cataract; PSC posterior subcapsular cataract; ERM epi-retinal membrane; PVD posterior vitreous detachment; RD retinal detachment; DM diabetes mellitus; DR diabetic retinopathy; NPDR non-proliferative diabetic retinopathy; PDR proliferative diabetic retinopathy; CSME clinically significant macular edema; DME diabetic macular edema; dbh dot blot hemorrhages; CWS cotton wool spot; POAG primary open angle glaucoma; C/D cup-to-disc ratio; HVF humphrey visual field; GVF goldmann visual field; OCT optical coherence tomography; IOP intraocular pressure; BRVO Branch retinal vein occlusion; CRVO central retinal vein occlusion; CRAO central retinal artery occlusion; BRAO branch retinal artery occlusion; RT retinal tear; SB scleral buckle; PPV pars plana vitrectomy; VH Vitreous hemorrhage; PRP panretinal laser photocoagulation; IVK intravitreal kenalog; VMT vitreomacular traction; MH Macular hole;  NVD neovascularization of the disc; NVE neovascularization elsewhere; AREDS age related eye disease study; ARMD age  related macular degeneration; POAG primary open angle glaucoma; EBMD epithelial/anterior basement membrane dystrophy; ACIOL anterior chamber intraocular lens; IOL intraocular lens; PCIOL posterior chamber intraocular lens; Phaco/IOL phacoemulsification with intraocular lens placement; PRK photorefractive keratectomy; LASIK laser assisted in situ keratomileusis; HTN hypertension; DM diabetes mellitus; COPD chronic obstructive pulmonary disease

## 2023-07-24 ENCOUNTER — Ambulatory Visit: Payer: Medicare Other | Admitting: Cardiology

## 2023-07-30 ENCOUNTER — Encounter (INDEPENDENT_AMBULATORY_CARE_PROVIDER_SITE_OTHER): Payer: Medicare Other | Admitting: Ophthalmology

## 2023-07-30 DIAGNOSIS — H43813 Vitreous degeneration, bilateral: Secondary | ICD-10-CM

## 2023-07-30 DIAGNOSIS — E119 Type 2 diabetes mellitus without complications: Secondary | ICD-10-CM

## 2023-07-30 DIAGNOSIS — H353132 Nonexudative age-related macular degeneration, bilateral, intermediate dry stage: Secondary | ICD-10-CM

## 2023-07-30 DIAGNOSIS — Z794 Long term (current) use of insulin: Secondary | ICD-10-CM

## 2023-07-30 DIAGNOSIS — I10 Essential (primary) hypertension: Secondary | ICD-10-CM

## 2023-07-30 DIAGNOSIS — H35033 Hypertensive retinopathy, bilateral: Secondary | ICD-10-CM

## 2023-07-30 DIAGNOSIS — Z961 Presence of intraocular lens: Secondary | ICD-10-CM

## 2023-08-05 NOTE — Progress Notes (Deleted)
 Office Visit Note  Patient: [name]  JASEY CORTEZ             Date of Birth: 1945-12-19           MRN: [id number]             PCP: Yolanda Hence, MD Referring: Yolanda Hence, MD Visit Date: 08/19/2023 Occupation: @GUAROCC @  Subjective:     History of Present Illness: Jennika  RACHELANN ENLOE is a 78 y.o. female with history of seropositive rheumatoid arthritis and discoid lupus.  Patient remains on enbrel  50 mg sq injections once weekly.   CBC and CMP updated on 03/21/23.   TB gold negative on 09/19/22.  Discussed the importance of holding enbrel  if she develops signs or symptoms of an infection and to resume once the infection has completely cleared.   Activities of Daily Living:  Patient reports morning stiffness for *** {minute/hour:19697}.   Patient {ACTIONS;DENIES/REPORTS:21021675::Denies} nocturnal pain.  Difficulty dressing/grooming: {ACTIONS;DENIES/REPORTS:21021675::Denies} Difficulty climbing stairs: {ACTIONS;DENIES/REPORTS:21021675::Denies} Difficulty getting out of chair: {ACTIONS;DENIES/REPORTS:21021675::Denies} Difficulty using hands for taps, buttons, cutlery, and/or writing: {ACTIONS;DENIES/REPORTS:21021675::Denies}  No Rheumatology ROS completed.   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   Gastroesophageal reflux disease without esophagitis 11/27/2019   Generalized anxiety disorder 11/27/2019   Hypothyroidism 11/27/2019   Smoker 11/27/2019   Type 2 diabetes mellitus without complication (HCC) 11/27/2019   Varicose veins of lower extremity 11/27/2019   Vitamin D deficiency 11/27/2019   Arthritis of hand 11/05/2019    Inflammation of joint of both hands 11/05/2019   Encounter for orthopedic follow-up care 01/09/2019   Tenosynovitis of fingers 09/22/2018   Tenosynovitis of right wrist 09/22/2018   Pain in right hand 09/08/2018   Nonintractable headache 08/16/2016   Carotid artery tenderness 10/26/2015   Leg pain, left 07/27/2014   Back pain of thoracolumbar region 08/11/2012   Renal cyst 08/04/2012   Cervical spondylosis with radiculopathy 01/24/2012   Chronic interstitial cystitis 11/01/2011   Mixed incontinence 11/01/2011   EMPHYSEMA 08/05/2008   LEUKOCYTOSIS 06/25/2008   COPD 06/25/2008   GASTROPARESIS 06/25/2008   DM 06/14/2008   Hyperlipidemia LDL goal <70 06/14/2008   Essential hypertension 06/14/2008   ASTHMATIC BRONCHITIS, ACUTE 06/14/2008   ALLERGIC RHINITIS 06/14/2008   LUPUS 06/14/2008    Past Medical History:  Diagnosis Date   Arthritis    little bit qwhere (01/24/2012)   Asthma    Chronic back pain    neck to tailbone (01/24/2012)   COPD (chronic obstructive pulmonary disease) (HCC)    COVID 12/2022   Depression    takes Zoloft  daily   Fibromyalgia    History of bladder infections    sees a urologist--on long term Macrodantin  nightly   History of blood transfusion    when I was a child (01/24/2012)   Hyperlipidemia    takes Simvastatin  daily   Hypertension    takes Maxzide  daily   Hypothyroidism    takes Synthroid  daily   Insomnia    takes Ambien  nightly   Lupus    Neck pain    herniated disc and radiculopathy   Peripheral neuropathy    Pneumonia 03/05/2010   have had it a couple times (01/24/2012)  Post-COVID syndrome    Type II diabetes mellitus (HCC)    takes Janumet  daily    Family History  Problem Relation Age of Onset   Heart disease Mother 41       Congestive HF, CABG   Arrhythmia Father    Diabetes Sister    Heart disease Sister    Blindness Brother    Heart attack Brother    Diabetes Brother    Past Surgical History:  Procedure  Laterality Date   ANTERIOR CERVICAL DECOMP/DISCECTOMY FUSION  2002; 01/24/2012   ANTERIOR CERVICAL DECOMP/DISCECTOMY FUSION  01/24/2012   Procedure: ANTERIOR CERVICAL DECOMPRESSION/DISCECTOMY FUSION 1 LEVEL;  Surgeon: Elder Greening, MD;  Location: [facility];  Service: Neurosurgery;  Laterality: N/A;  CERVICAL THREE-FOUR anterior cervical decompression with fusion interbody prothesis plating and bonegraft   APPENDECTOMY  11/04/1958   BACK SURGERY  03/06/1999   BLADDER SURGERY  03/06/1975   through abdomen   BLADDER SUSPENSION  03/05/1972   BUNIONECTOMY  11/04/1978   left   CARDIAC CATHETERIZATION  03/05/2006   CATARACT EXTRACTION     COLONOSCOPY     CORONARY STENT INTERVENTION N/A 09/01/2021   Procedure: CORONARY STENT INTERVENTION;  Surgeon: Odie Benne, MD;  Location: [facility];  Service: Cardiovascular;  Laterality: N/A;   ESOPHAGOGASTRODUODENOSCOPY     LEFT HEART CATH AND CORONARY ANGIOGRAPHY N/A 09/01/2021   Procedure: LEFT HEART CATH AND CORONARY ANGIOGRAPHY;  Surgeon: Odie Benne, MD;  Location: [facility];  Service: Cardiovascular;  Laterality: N/A;   LUMBAR FUSION  03/06/2007   MOUTH SURGERY     growth removed inside mouth per patient   RECTOCELE REPAIR  03/05/1998   VAGINAL HYSTERECTOMY  03/05/1972   Social History   Social History Narrative   Right Handed    Lives in a one story home    Immunization History  Administered Date(s) Administered   Influenza Split 01/25/2012   Influenza, High Dose Seasonal PF 01/01/2017   Influenza-Unspecified 12/03/2012   Pneumococcal Conjugate-13 12/17/2017   Pneumococcal Polysaccharide-23 01/25/2012   Tdap 04/14/2014     Objective: Vital Signs: There were no vitals taken for this visit.   Physical Exam Vitals and nursing note reviewed.  Constitutional:      Appearance: She is well-developed.  HENT:     Head: Normocephalic and atraumatic.   Eyes:     Conjunctiva/sclera:  Conjunctivae normal.    Cardiovascular:     Rate and Rhythm: Normal rate and regular rhythm.     Heart sounds: Normal heart sounds.  Pulmonary:     Effort: Pulmonary effort is normal.     Breath sounds: Normal breath sounds.  Abdominal:     General: Bowel sounds are normal.     Palpations: Abdomen is soft.   Musculoskeletal:     Cervical back: Normal range of motion.  Lymphadenopathy:     Cervical: No cervical adenopathy.   Skin:    General: Skin is warm and dry.     Capillary Refill: Capillary refill takes less than 2 seconds.   Neurological:     Mental Status: She is alert and oriented to person, place, and time.   Psychiatric:        Behavior: Behavior normal.      Musculoskeletal Exam: ***  CDAI Exam: CDAI Score: -- Patient Global: --; Provider Global: -- Swollen: --; Tender: -- Joint Exam 08/19/2023   No joint exam has been documented for this visit   There  is currently no information documented on the homunculus. Go to the Rheumatology activity and complete the homunculus joint exam.  Investigation: No additional findings.  Imaging: No results found.  Recent Labs: Lab Results  Component Value Date   WBC 10.7 03/21/2023   HGB 13.7 03/21/2023   PLT 248 03/21/2023   NA 137 03/21/2023   K 3.9 03/21/2023   CL 99 03/21/2023   CO2 27 03/21/2023   GLUCOSE 95 03/21/2023   BUN 22 03/21/2023   CREATININE 0.90 03/21/2023   BILITOT 0.4 03/21/2023   ALKPHOS 67 12/22/2022   AST 12 03/21/2023   ALT 7 03/21/2023   PROT 7.3 03/21/2023   ALBUMIN  3.7 12/22/2022   CALCIUM 9.3 03/21/2023   GFRAA 52 (L) 06/16/2020   QFTBGOLDPLUS NEGATIVE 09/19/2022    Speciality Comments: No specialty comments available.  Procedures:  No procedures performed Allergies: Erythromycin, Penicillins, Gadolinium derivatives, Iodinated contrast media, Latex, Morphine, Other, Prednisone , Sulfonamide derivatives, Varenicline tartrate, Isosorbide , Aspirin , and Atorvastatin    Assessment / Plan:     Visit Diagnoses: Seronegative rheumatoid arthritis (HCC)  High risk medication use  Discoid lupus  Digital mucinous cyst  Ganglion cyst  DDD (degenerative disc disease), cervical  Degeneration of intervertebral disc of lumbar region without discogenic back pain or lower extremity pain  Fibromyalgia  History of type 2 diabetes mellitus  History of diabetic gastroparesis  Chronic diarrhea  History of COPD  History of asthma  Neuropathy  Essential hypertension  History of hyperlipidemia  History of hypothyroidism  History of depression  Smoker  B12 deficiency  Orders: No orders of the defined types were placed in this encounter.  No orders of the defined types were placed in this encounter.   Face-to-face time spent with patient was *** minutes. Greater than 50% of time was spent in counseling and coordination of care.  Follow-Up Instructions: No follow-ups on file.   Romayne Clubs, PA-C  Note - This record has been created using Dragon software.  Chart creation errors have been sought, but may not always  have been located. Such creation errors do not reflect on  the standard of medical care.

## 2023-08-19 ENCOUNTER — Other Ambulatory Visit: Payer: Self-pay

## 2023-08-19 ENCOUNTER — Ambulatory Visit: Payer: Medicare Other | Admitting: Physician Assistant

## 2023-08-19 DIAGNOSIS — M0600 Rheumatoid arthritis without rheumatoid factor, unspecified site: Secondary | ICD-10-CM

## 2023-08-19 DIAGNOSIS — Z8639 Personal history of other endocrine, nutritional and metabolic disease: Secondary | ICD-10-CM

## 2023-08-19 DIAGNOSIS — G629 Polyneuropathy, unspecified: Secondary | ICD-10-CM

## 2023-08-19 DIAGNOSIS — Z79899 Other long term (current) drug therapy: Secondary | ICD-10-CM

## 2023-08-19 DIAGNOSIS — M67449 Ganglion, unspecified hand: Secondary | ICD-10-CM

## 2023-08-19 DIAGNOSIS — Z8659 Personal history of other mental and behavioral disorders: Secondary | ICD-10-CM

## 2023-08-19 DIAGNOSIS — M6740 Ganglion, unspecified site: Secondary | ICD-10-CM

## 2023-08-19 DIAGNOSIS — M797 Fibromyalgia: Secondary | ICD-10-CM

## 2023-08-19 DIAGNOSIS — E538 Deficiency of other specified B group vitamins: Secondary | ICD-10-CM

## 2023-08-19 DIAGNOSIS — I10 Essential (primary) hypertension: Secondary | ICD-10-CM

## 2023-08-19 DIAGNOSIS — K529 Noninfective gastroenteritis and colitis, unspecified: Secondary | ICD-10-CM

## 2023-08-19 DIAGNOSIS — Z8709 Personal history of other diseases of the respiratory system: Secondary | ICD-10-CM

## 2023-08-19 DIAGNOSIS — L930 Discoid lupus erythematosus: Secondary | ICD-10-CM

## 2023-08-19 DIAGNOSIS — M51369 Other intervertebral disc degeneration, lumbar region without mention of lumbar back pain or lower extremity pain: Secondary | ICD-10-CM

## 2023-08-19 DIAGNOSIS — F17200 Nicotine dependence, unspecified, uncomplicated: Secondary | ICD-10-CM

## 2023-08-19 DIAGNOSIS — M5030 Other cervical disc degeneration, unspecified cervical region: Secondary | ICD-10-CM

## 2023-08-19 MED ORDER — ENBREL MINI 50 MG/ML ~~LOC~~ SOCT: 50.0000 mg | SUBCUTANEOUS | 0 refills | Status: DC

## 2023-08-19 NOTE — Telephone Encounter (Signed)
 Last Fill: 04/17/2023  Labs: 03/21/2023 CMP WNL-renal function has improved significantly.   Absolute eosinophils are elevated--can be elevated with allergies.  Her eosinophils have been elevated off and on since march 2024. We will continue to monitor and if they remain elevated she may need further evaluation. Rest of CBC WNL.     Will update labs at next appointment  TB Gold: 09/19/2022 TB Gold Negative   Next Visit: 08/28/2023  Last Visit: 03/21/2023  ZO:[id number] rheumatoid arthritis   Current Dose per office note 03/21/2023: Enbrel  50 mg sq injections once weekly   Okay to refill Enbrel ?

## 2023-08-20 ENCOUNTER — Other Ambulatory Visit (HOSPITAL_COMMUNITY): Payer: Self-pay

## 2023-08-20 NOTE — Progress Notes (Unsigned)
 Office Visit Note  Patient: [name]  DEVINE DANT             Date of Birth: Dec 18, 1945           MRN: [id number]             PCP: Theo Iha, MD Referring: Theo Iha, MD Visit Date: 08/28/2023 Occupation: @GUAROCC @  Subjective:  Medication monitoring   History of Present Illness: [name]  RYLEEANN [name] is a 78 y.o. female with history of seronegative rheumatoid arthritis.  Patient remains on  Enbrel  50 mg sq injections once weekly-started on 11/05/19.  She is tolerating Enbrel  without any side effects or injection site reactions.  She continues to find Enbrel  to be effective at managing her symptoms.  She experiences chronic pain and stiffness in the right hand but denies any joint swelling at this time.  Patient is been taking Tylenol  as needed for pain relief.  Patient states that she has developed an allergy to tramadol .  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  She has a chronic cough and fatigue.  Patient states that she has been trying to work on gaining weight and has increased her protein intake.   Activities of Daily Living:  Patient reports morning stiffness for 15 minutes.   Patient Reports nocturnal pain.  Difficulty dressing/grooming: Reports Difficulty climbing stairs: Reports Difficulty getting out of chair: Reports Difficulty using hands for taps, buttons, cutlery, and/or writing: Reports  Review of Systems  Constitutional:  Positive for fatigue.  HENT: Negative.  Negative for mouth sores and mouth dryness.   Eyes: Negative.  Negative for dryness.  Respiratory:  Positive for shortness of breath.   Cardiovascular:  Positive for chest pain and palpitations.  Gastrointestinal:  Positive for diarrhea. Negative for blood in stool and constipation.  Endocrine: Negative.  Negative for increased urination.  Genitourinary: Negative.  Negative for involuntary urination.  Musculoskeletal:  Positive for joint pain, gait problem, joint pain,  joint swelling, myalgias, muscle weakness, morning stiffness, muscle tenderness and myalgias.  Skin:  Negative for color change, rash, hair loss and sensitivity to sunlight.  Allergic/Immunologic: Negative.  Negative for susceptible to infections.  Neurological:  Negative for dizziness and headaches.  Hematological:  Positive for swollen glands.  Psychiatric/Behavioral:  Negative for depressed mood and sleep disturbance. The patient is not nervous/anxious.     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   Gastroesophageal reflux disease without esophagitis 11/27/2019   Generalized anxiety disorder 11/27/2019   Hypothyroidism 11/27/2019   Smoker 11/27/2019   Type 2 diabetes mellitus without complication (HCC) 11/27/2019   Varicose veins of lower extremity 11/27/2019   Vitamin D deficiency 11/27/2019   Arthritis of hand 11/05/2019   Inflammation of joint of both hands 11/05/2019   Encounter for orthopedic follow-up care 01/09/2019   Tenosynovitis of fingers 09/22/2018   Tenosynovitis of right wrist 09/22/2018   Pain in right hand 09/08/2018   Nonintractable headache 08/16/2016   Carotid artery tenderness 10/26/2015   Leg pain, left 07/27/2014   Back pain of thoracolumbar region 08/11/2012   Renal cyst 08/04/2012   Cervical spondylosis with radiculopathy 01/24/2012   Chronic interstitial cystitis 11/01/2011  Mixed incontinence 11/01/2011   EMPHYSEMA 08/05/2008   LEUKOCYTOSIS 06/25/2008   COPD 06/25/2008   GASTROPARESIS 06/25/2008   DM 06/14/2008   Hyperlipidemia LDL goal <70 06/14/2008   Essential hypertension 06/14/2008   ASTHMATIC BRONCHITIS, ACUTE  06/14/2008   ALLERGIC RHINITIS 06/14/2008   LUPUS 06/14/2008    Past Medical History:  Diagnosis Date   Arthritis    little bit qwhere (01/24/2012)   Asthma    Chronic back pain    neck to tailbone (01/24/2012)   COPD (chronic obstructive pulmonary disease) (HCC)    COVID 12/2022   Depression    takes Zoloft  daily   Fibromyalgia    History of bladder infections    sees a urologist--on long term Macrodantin  nightly   History of blood transfusion    when I was a child (01/24/2012)   Hyperlipidemia    takes Simvastatin  daily   Hypertension    takes Maxzide  daily   Hypothyroidism    takes Synthroid  daily   Insomnia    takes Ambien  nightly   Lupus    Neck pain    herniated disc and radiculopathy   Peripheral neuropathy    Pneumonia 03/05/2010   have had it a couple times (01/24/2012)   Post-COVID syndrome    Type II diabetes mellitus (HCC)    takes Janumet  daily    Family History  Problem Relation Age of Onset   Heart disease Mother 41       Congestive HF, CABG   Arrhythmia Father    Diabetes Sister    Heart disease Sister    Blindness Brother    Heart attack Brother    Diabetes Brother    Past Surgical History:  Procedure Laterality Date   ANTERIOR CERVICAL DECOMP/DISCECTOMY FUSION  2002; 01/24/2012   ANTERIOR CERVICAL DECOMP/DISCECTOMY FUSION  01/24/2012   Procedure: ANTERIOR CERVICAL DECOMPRESSION/DISCECTOMY FUSION 1 LEVEL;  Surgeon: Reyes JONETTA Budge, MD;  Location: [facility];  Service: Neurosurgery;  Laterality: N/A;  CERVICAL THREE-FOUR anterior cervical decompression with fusion interbody prothesis plating and bonegraft   APPENDECTOMY  11/04/1958   BACK SURGERY  03/06/1999   BLADDER SURGERY  03/06/1975   through abdomen   BLADDER SUSPENSION  03/05/1972   BUNIONECTOMY  11/04/1978   left   CARDIAC CATHETERIZATION  03/05/2006   CATARACT EXTRACTION     COLONOSCOPY     CORONARY STENT INTERVENTION N/A 09/01/2021   Procedure: CORONARY STENT  INTERVENTION;  Surgeon: Verlin Lonni JONETTA, MD;  Location: [facility];  Service: Cardiovascular;  Laterality: N/A;   ESOPHAGOGASTRODUODENOSCOPY     LEFT HEART CATH AND CORONARY ANGIOGRAPHY N/A 09/01/2021   Procedure: LEFT HEART CATH AND CORONARY ANGIOGRAPHY;  Surgeon: Verlin Lonni JONETTA, MD;  Location: [facility];  Service: Cardiovascular;  Laterality: N/A;   LUMBAR FUSION  03/06/2007   MOUTH SURGERY     growth removed inside mouth per patient   RECTOCELE REPAIR  03/05/1998   VAGINAL HYSTERECTOMY  03/05/1972   Social History   Social History Narrative   Right Handed    Lives in a one story home    Immunization History  Administered Date(s) Administered   Influenza Split 01/25/2012   Influenza, High Dose Seasonal PF 01/01/2017   Influenza-Unspecified 12/03/2012, 01/01/2017   Pneumococcal Conjugate-13 12/17/2017   Pneumococcal Polysaccharide-23 01/25/2012   Tdap 04/14/2014, 01/29/2021     Objective: Vital Signs: BP (!) 148/74 (BP Location: Left Arm, Patient Position: Sitting)   Pulse 91   Resp 16  Ht 5' 5 (1.651 m)   Wt 112 lb 4.8 oz (50.9 kg)   BMI 18.69 kg/m    Physical Exam Vitals and nursing note reviewed.  Constitutional:      Appearance: She is well-developed.  HENT:     Head: Normocephalic and atraumatic.   Eyes:     Conjunctiva/sclera: Conjunctivae normal.    Cardiovascular:     Rate and Rhythm: Normal rate and regular rhythm.     Heart sounds: Normal heart sounds.  Pulmonary:     Effort: Pulmonary effort is normal.     Breath sounds: Wheezing and rhonchi present.  Abdominal:     General: Bowel sounds are normal.     Palpations: Abdomen is soft.   Musculoskeletal:     Cervical back: Normal range of motion.   Skin:    General: Skin is warm and dry.     Capillary Refill: Capillary refill takes less than 2 seconds.   Neurological:     Mental Status: She is alert and oriented to person, place, and time.   Psychiatric:         Behavior: Behavior normal.      Musculoskeletal Exam: C-spine has limited range of motion.  Limited mobility and discomfort with range of motion of the lumbar spine.  Shoulder joints have discomfort range of motion with some tenderness in the right shoulder.  No tenderness or swelling along the elbow joint line.  Tenderness of both CMC joints.  PIP and DIP thickening consistent with osteoarthritis of both hands.  Complete fist formation bilaterally.  Hip joints have slightly limited ROM.  Knee joints have good ROM with no warmth or effusion.  Dorsal spurs noted bilaterally.  No tenderness or swelling over the ankle joints.   CDAI Exam: CDAI Score: -- Patient Global: --; Provider Global: -- Swollen: --; Tender: -- Joint Exam 08/28/2023   No joint exam has been documented for this visit   There is currently no information documented on the homunculus. Go to the Rheumatology activity and complete the homunculus joint exam.  Investigation: No additional findings.  Imaging: No results found.  Recent Labs: Lab Results  Component Value Date   WBC 10.7 03/21/2023   HGB 13.7 03/21/2023   PLT 248 03/21/2023   NA 137 03/21/2023   K 3.9 03/21/2023   CL 99 03/21/2023   CO2 27 03/21/2023   GLUCOSE 95 03/21/2023   BUN 22 03/21/2023   CREATININE 0.90 03/21/2023   BILITOT 0.4 03/21/2023   ALKPHOS 67 12/22/2022   AST 12 03/21/2023   ALT 7 03/21/2023   PROT 7.3 03/21/2023   ALBUMIN  3.7 12/22/2022   CALCIUM 9.3 03/21/2023   GFRAA 52 (L) 06/16/2020   QFTBGOLDPLUS NEGATIVE 09/19/2022    Speciality Comments: No specialty comments available.  Procedures:  No procedures performed Allergies: Erythromycin, Penicillins, Gadolinium derivatives, Iodinated contrast media, Latex, Morphine, Other, Prednisone , Sulfonamide derivatives, Varenicline tartrate, Isosorbide , Aspirin , and Atorvastatin    Assessment / Plan:     Visit Diagnoses: Seronegative rheumatoid arthritis (HCC) - She has no  synovitis on examination today.  She continues to have chronic pain in both hands due to underlying osteoarthritis.  She has some tenderness over both CMC joints but no active inflammation was noted.  She remains on Enbrel  50 mg sq injections once weekly.  She is tolerating Enbrel  without any side effects or injection site reactions.  She continues to find Enbrel  to be effective at managing her symptoms.  No medication changes will  be made at this time.  She was advised to notify us  if she develops signs or symptoms of a flare.  She will follow-up in the office in 5 months or sooner if needed. Plan: Lipid panel  High risk medication use - Enbrel  50 mg sq injections once weekly-started on 11/05/19.  (D/c MTX 0.4 ml sq wkly inj-low dose- elevated creatinine and recurrent bronchitis). CBC and CMP updated on 03/21/23. Orders for CBC and CMP released today.  Her next lab work will be due in September and every 3 months to monitor for drug toxicity. TB gold negative on 09/19/22.  Order for TB gold released today. Order for lipid panel released today.  She is currently prescribed Repatha .  Discussed the importance of holding enbrel  if she develops signs or symptoms of an infection and to resume once the infection has completely cleared.   - Plan: CBC with Differential/Platelet, Comprehensive metabolic panel with GFR, QuantiFERON-TB Gold Plus, Lipid panel  Screening for tuberculosis - Order for TB gold released today.  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 Discussed the importance of wearing sunscreen daily and avoiding direct UV exposure.   Digital mucinous cyst - Surgically excised by Dr. Meredeth recurrence.  Ganglion cyst - Volar aspect of left wrist- Dr. Laquita excised. No recurrence.  DDD (degenerative disc disease), cervical: C-spine has limited ROM with lateral rotation.   Degeneration of intervertebral disc of lumbar region without discogenic back pain or lower  extremity pain: Chronic pain and limited mobility.  Fibromyalgia: She has intermittent myalgias and muscle tenderness due to fibromyalgia.  No longer taking tramadol  due to experiencing diffuse itching.  She is been taking Tylenol  as needed for pain relief.  Other medical conditions are listed as follows:   History of type 2 diabetes mellitus  History of diabetic gastroparesis  Chronic diarrhea  History of COPD  History of asthma  Neuropathy  Essential hypertension: BP was 148/74 today in the office.   History of hyperlipidemia - Lipid panel obtained today.  Currently prescribed Repatha .  Plan: Lipid panel  History of hypothyroidism  History of depression  Smoker  B12 deficiency   Orders: Orders Placed This Encounter  Procedures   CBC with Differential/Platelet   Comprehensive metabolic panel with GFR   QuantiFERON-TB Gold Plus   Lipid panel   No orders of the defined types were placed in this encounter.   Follow-Up Instructions: Return in about 5 months (around 01/28/2024).   Waddell CHRISTELLA Craze, PA-C  Note - This record has been created using Dragon software.  Chart creation errors have been sought, but may not always  have been located. Such creation errors do not reflect on  the standard of medical care.

## 2023-08-21 ENCOUNTER — Other Ambulatory Visit (HOSPITAL_COMMUNITY): Payer: Self-pay

## 2023-08-21 NOTE — Progress Notes (Signed)
 [facility] - Clinic Note  08/28/2023     CHIEF COMPLAINT Patient presents for Retina Follow Up   HISTORY OF PRESENT ILLNESS: [name]  VICKEY [name] is a 78 y.o. female who presents to the clinic today for:   HPI     Retina Follow Up   Patient presents with  Diabetic Retinopathy.  In both eyes.  This started 10 months ago.  Duration of 10 months.  Since onset it is stable.  I, the attending physician,  performed the HPI with the patient and updated documentation appropriately.        Comments   10 month retina follow up DM exam pt is reporting vision is not as sharp after COVID since her last visit she denies any flashes some floaters pt last reading A1C 5.6      Last edited by Valdemar Rogue, MD on 09/01/2023 12:04 PM.    Pt had covid in October and spent 4 days in the hospital  Referring physician: Theo Iha, MD [address] [place],  [name] [address]  HISTORICAL INFORMATION:   Selected notes from the MEDICAL RECORD NUMBER Self referral for DEE -- pt of Dr. Octavia [name]:  Ocular Hx- PMH-    CURRENT MEDICATIONS: No current outpatient medications on file. (Ophthalmic Drugs)   No current facility-administered medications for this visit. (Ophthalmic Drugs)   Current Outpatient Medications (Other)  Medication Sig   albuterol  (VENTOLIN  HFA) 108 (90 Base) MCG/ACT inhaler Inhale 2 puffs into the lungs every 8 (eight) hours as needed (asthma / chest congestion).   ALPRAZolam  (XANAX ) 0.25 MG tablet Take 0.25 mg by mouth 2 (two) times daily as needed for anxiety.   benzonatate  (TESSALON ) 200 MG capsule Take 1 capsule (200 mg total) by mouth 3 (three) times daily as needed (intractable cough).   BREZTRI AEROSPHERE 160-9-4.8 MCG/ACT AERO Inhale 2 puffs into the lungs 2 (two) times daily.   donepezil  (ARICEPT ) 5 MG tablet Take 5 mg by mouth at bedtime.   etanercept  (ENBREL  MINI) 50 MG/ML injection Inject 1 mL (50 mg total) into the skin once  a week.   Evolocumab  (REPATHA  SURECLICK) 140 MG/ML SOAJ Inject 140 mg into the skin every 14 (fourteen) days.   guaiFENesin -dextromethorphan  (ROBITUSSIN DM) 100-10 MG/5ML syrup Take 15 mLs by mouth every 4 (four) hours as needed for cough.   insulin  glargine, 1 Unit Dial, (TOUJEO SOLOSTAR) 300 UNIT/ML Solostar Pen Inject 0-50 Units into the skin See admin instructions. Sliding scale  Under 120=0 Over 120= 10-20 units in the morning Bedtime 20 units if needed, can take up to 50 units depending in Blood glucose  35 units in the morning and 50 units at bedtime.   levothyroxine  (SYNTHROID , LEVOTHROID) 50 MCG tablet Take 50 mcg by mouth daily before breakfast.   metFORMIN  (GLUCOPHAGE ) 1000 MG tablet Take 1 tablet (1,000 mg total) by mouth daily with breakfast.   nitroGLYCERIN  (NITROSTAT ) 0.4 MG SL tablet Place 1 tablet (0.4 mg total) under the tongue every 5 (five) minutes x 3 doses as needed for chest pain. If no relief after 3rd dose, proceed to the ED for an evaluation   ondansetron  (ZOFRAN ) 4 MG tablet Take 4 mg by mouth every 8 (eight) hours as needed for nausea or vomiting.   ondansetron  (ZOFRAN -ODT) 8 MG disintegrating tablet Place 1 tablet twice a day by translingual route as needed.   ONETOUCH VERIO test strip 1 each 4 (four) times daily.   sertraline  (ZOLOFT ) 100 MG  tablet Take 100 mg by mouth daily.    traMADol  (ULTRAM ) 50 MG tablet Take 50 mg by mouth every 6 (six) hours as needed.   triamterene -hydrochlorothiazide  (MAXZIDE -25) 37.5-25 MG tablet Take 1 tablet by mouth daily.   VITAMIN D PO Take 5,000 Units by mouth daily.   zolpidem  (AMBIEN ) 5 MG tablet Take 5 mg by mouth at bedtime. For sleep   aspirin  EC 81 MG tablet Take 1 tablet (81 mg total) by mouth daily. Swallow whole. (Patient not taking: Reported on 08/28/2023)   No current facility-administered medications for this visit. (Other)   REVIEW OF SYSTEMS: ROS   Positive for: Eyes Negative for: Constitutional, Gastrointestinal,  Neurological, Skin, Genitourinary, Musculoskeletal, HENT, Endocrine, Cardiovascular, Respiratory, Psychiatric, Allergic/Imm, Heme/Lymph Last edited by Resa Delon ORN, COT on 08/28/2023 12:55 PM.       ALLERGIES Allergies  Allergen Reactions   Erythromycin Anaphylaxis   Penicillins Anaphylaxis   Gadolinium Derivatives Hives and Rash    Pt broke out in hives all over body.  Has IV steroids and Benadryl  in hospital for MRI.   Iodinated Contrast Media Hives    13-hour prep Pt had slight itching of her abd after her injection and had taken her 13 hr prep.  Consult with radiologist before another procedure to see if IV contrast is necessary, per Dr Eugene on 01/06/14, JB/   Latex Dermatitis and Other (See Comments)    blisters   Morphine Nausea And Vomiting   Other Itching and Other (See Comments)    Chlorhexin (CHG); burning   Prednisone  Other (See Comments)    shakes me out of my frame IM steroids aren't as bad as PO   Sulfonamide Derivatives Itching and Rash    All over body    Varenicline Tartrate Nausea And Vomiting        Isosorbide  Nausea Only    Short of breath and extremely tired and week also   Aspirin  Nausea Only and Other (See Comments)    Burning in stomach    Atorvastatin Other (See Comments)    System will not process   PAST MEDICAL HISTORY Past Medical History:  Diagnosis Date   Arthritis    little bit qwhere (01/24/2012)   Asthma    Chronic back pain    neck to tailbone (01/24/2012)   COPD (chronic obstructive pulmonary disease) (HCC)    COVID 12/2022   Depression    takes Zoloft  daily   Fibromyalgia    History of bladder infections    sees a urologist--on long term Macrodantin  nightly   History of blood transfusion    when I was a child (01/24/2012)   Hyperlipidemia    takes Simvastatin  daily   Hypertension    takes Maxzide  daily   Hypothyroidism    takes Synthroid  daily   Insomnia    takes Ambien  nightly   Lupus    Neck  pain    herniated disc and radiculopathy   Peripheral neuropathy    Pneumonia 03/05/2010   have had it a couple times (01/24/2012)   Post-COVID syndrome    Type II diabetes mellitus (HCC)    takes Janumet  daily   Past Surgical History:  Procedure Laterality Date   ANTERIOR CERVICAL DECOMP/DISCECTOMY FUSION  2002; 01/24/2012   ANTERIOR CERVICAL DECOMP/DISCECTOMY FUSION  01/24/2012   Procedure: ANTERIOR CERVICAL DECOMPRESSION/DISCECTOMY FUSION 1 LEVEL;  Surgeon: Reyes JONETTA Budge, MD;  Location: [facility];  Service: Neurosurgery;  Laterality: N/A;  CERVICAL THREE-FOUR anterior cervical decompression with  fusion interbody prothesis plating and bonegraft   APPENDECTOMY  11/04/1958   BACK SURGERY  03/06/1999   BLADDER SURGERY  03/06/1975   through abdomen   BLADDER SUSPENSION  03/05/1972   BUNIONECTOMY  11/04/1978   left   CARDIAC CATHETERIZATION  03/05/2006   CATARACT EXTRACTION     COLONOSCOPY     CORONARY STENT INTERVENTION N/A 09/01/2021   Procedure: CORONARY STENT INTERVENTION;  Surgeon: Verlin Lonni BIRCH, MD;  Location: [facility];  Service: Cardiovascular;  Laterality: N/A;   ESOPHAGOGASTRODUODENOSCOPY     LEFT HEART CATH AND CORONARY ANGIOGRAPHY N/A 09/01/2021   Procedure: LEFT HEART CATH AND CORONARY ANGIOGRAPHY;  Surgeon: Verlin Lonni BIRCH, MD;  Location: [facility];  Service: Cardiovascular;  Laterality: N/A;   LUMBAR FUSION  03/06/2007   MOUTH SURGERY     growth removed inside mouth per patient   RECTOCELE REPAIR  03/05/1998   VAGINAL HYSTERECTOMY  03/05/1972   FAMILY HISTORY Family History  Problem Relation Age of Onset   Heart disease Mother 58       Congestive HF, CABG   Arrhythmia Father    Diabetes Sister    Heart disease Sister    Blindness Brother    Heart attack Brother    Diabetes Brother    SOCIAL HISTORY Social History   Tobacco Use   Smoking status: Every Day    Current packs/day: 1.00    Average packs/day: 1  pack/day for 60.0 years (60.0 ttl pk-yrs)    Types: Cigarettes    Passive exposure: Never   Smokeless tobacco: Never  Vaping Use   Vaping status: Former  Substance Use Topics   Alcohol use: No   Drug use: No       OPHTHALMIC EXAM: Base Eye Exam     Visual Acuity (Snellen - Linear)       Right Left   Dist [place] 20/25 -1 20/25 -3   Dist ph [place] NI NI         Tonometry (Tonopen, 1:00 PM)       Right Left   Pressure 12 13         Pupils       Pupils Dark Light Shape React APD   Right PERRL 2 1 Round Brisk None   Left PERRL 2 1 Round Brisk None         Visual Fields       Left Right    Full Full         Extraocular Movement       Right Left    Full, Ortho Full, Ortho         Neuro/Psych     Oriented x3: Yes   Mood/Affect: Normal         Dilation     Both eyes: 2.5% Phenylephrine  @ 1:00 PM           Slit Lamp and Fundus Exam     Slit Lamp Exam       Right Left   Lids/Lashes Dermatochalasis - upper lid, Meibomian gland dysfunction Dermatochalasis - upper lid   Conjunctiva/Sclera White and quiet White and quiet   Cornea arcus, trace PEE inferiorly, well healed cataract wound arcus, trace PEE inferiorly, well healed cataract wound   Anterior Chamber deep and clear, no cell or flare deep and clear, no cell or flare   Iris round and poorly dilated Round and dilated   Lens PC IOL in good position with  open PC PC IOL in good position, trace Posterior capsular opacification   Anterior Vitreous Vitreous syneresis, no cell or pigment, Posterior vitreous detachment, Weiss ring Vitreous syneresis, no cell or pigment, Posterior vitreous detachment         Fundus Exam       Right Left   Disc Pink and Sharp Pink and Sharp   C/D Ratio 0.3 0.4   Macula Flat, Good foveal reflex, Drusen, mild RPE mottling, No heme or edema Flat, Good foveal reflex, Drusen, RPE mottling and clumping, central vitelliform like lesion -- improved, No heme or edema, early  atrophy   Vessels mild attenuation, mild tortuosity attenuated, Tortuous   Periphery Attached, rare peripheral DBH, No RT/RD Attached, No heme, No RT/RD            IMAGING AND PROCEDURES  Imaging and Procedures for 08/28/2023  OCT, Retina - OU - Both Eyes       Right Eye Quality was good. Central Foveal Thickness: 283. Progression has been stable. Findings include normal foveal contour, no IRF, no SRF, retinal drusen (stable release of VMA to full PVD, trace vitreous opacities -- improved, trace ERM).   Left Eye Quality was good. Central Foveal Thickness: 271. Progression has been stable. Findings include normal foveal contour, no IRF, no SRF, retinal drusen , subretinal hyper-reflective material (Stable release of VMA to full PVD, Mild central SRHM / vitelliform like lesion, no DME).   Notes *Images captured and stored on drive  Diagnosis / Impression:  NFP; no IRF/SRF OU No DME OU OD: stable release of VMA to full PVD, trace vitreous opacities -- improved, trace ERM OS: stable release of VMA to full PVD, Mild central SRHM / vitelliform like lesion, no DME   Clinical management:  See below  Abbreviations: NFP - Normal foveal profile. CME - cystoid macular edema. PED - pigment epithelial detachment. IRF - intraretinal fluid. SRF - subretinal fluid. EZ - ellipsoid zone. ERM - epiretinal membrane. ORA - outer retinal atrophy. ORT - outer retinal tubulation. SRHM - subretinal hyper-reflective material. IRHM - intraretinal hyper-reflective material      CBC with Differential/Platelet      Component Value Flag Ref Range Units Status   WBC 12.6      3.8 - 10.8 Thousand/uL Final   RBC 4.48      3.80 - 5.10 Million/uL Final   Hemoglobin 14.5      11.7 - 15.5 g/dL Final   HCT 56.5      64.9 - 45.0 % Final   MCV 96.9      80.0 - 100.0 fL Final   MCH 32.4      27.0 - 33.0 pg Final   MCHC 33.4      32.0 - 36.0 g/dL Final   Comment:   For adults, a slight decrease in the  calculated MCHC value (in the range of 30 to 32 g/dL) is most likely not clinically significant; however, it should be interpreted with caution in correlation with other red cell parameters and the patient's clinical condition.    RDW 13.1      11.0 - 15.0 % Final   Platelets 234      140 - 400 Thousand/uL Final   MPV 9.5      7.5 - 12.5 fL Final   Neutro Abs 8,921      1,500 - 7,800 cells/uL Final   Absolute Lymphocytes 1,424      850 - 3,900 cells/uL Final  Absolute Monocytes 743      200 - 950 cells/uL Final   Eosinophils Absolute 1,373      15 - 500 cells/uL Final   Basophils Absolute 139      0 - 200 cells/uL Final   Neutrophils Relative % 70.8       % Final   Total Lymphocyte 11.3       % Final   Monocytes Relative 5.9       % Final   Eosinophils Relative 10.9       % Final   Basophils Relative 1.1       % Final           Comprehensive metabolic panel with GFR      Component Value Flag Ref Range Units Status   Glucose, Bld 118      65 - 99 mg/dL Final   Comment:   .            Fasting reference interval . For someone without known diabetes, a glucose value between 100 and 125 mg/dL is consistent with prediabetes and should be confirmed with a follow-up test. .    BUN 21      7 - 25 mg/dL Final   Creat 8.99      0.60 - 1.00 mg/dL Final   eGFR 58      > OR = 60 mL/min/1.8m2 Final   BUN/Creatinine Ratio SEE NOTE:      6 - 22 (calc) Final   Comment:      Not Reported: BUN and Creatinine are within    reference range. .    Sodium 136      135 - 146 mmol/L Final   Potassium 4.1      3.5 - 5.3 mmol/L Final   Chloride 98      98 - 110 mmol/L Final   CO2 26      20 - 32 mmol/L Final   Calcium 9.0      8.6 - 10.4 mg/dL Final   Total Protein 7.0      6.1 - 8.1 g/dL Final   Albumin  4.3      3.6 - 5.1 g/dL Final   Globulin 2.7      1.9 - 3.7 g/dL (calc) Final   AG Ratio 1.6      1.0 - 2.5 (calc) Final   Total Bilirubin 0.6      0.2 - 1.2 mg/dL Final   Alkaline  phosphatase (APISO) 56      37 - 153 U/L Final   AST 16      10 - 35 U/L Final   ALT 11      6 - 29 U/L Final           QuantiFERON-TB Gold Plus      Component Value Flag Ref Range Units Status   QuantiFERON-TB Gold Plus NEGATIVE      NEGATIVE  Final   Comment:   Negative test result. M. tuberculosis complex  infection unlikely.    NIL 0.02       IU/mL Final   Mitogen-NIL >10.00       IU/mL Final   TB1-NIL 0.00       IU/mL Final   TB2-NIL 0.00       IU/mL Final   Comment:   . The Nil tube value reflects the background interferon gamma immune response of the patient's blood sample. This value has been subtracted from the  patient's displayed TB and Mitogen results. . Lower than expected results with the Mitogen tube prevent false-negative Quantiferon readings by detecting a patient with a potential immune suppressive condition and/or suboptimal pre-analytical specimen handling. . The TB1 Antigen tube is coated with the M. tuberculosis-specific antigens designed to elicit responses from TB antigen primed CD4+ helper T-lymphocytes. . The TB2 Antigen tube is coated with the M. tuberculosis-specific antigens designed to elicit responses from TB antigen primed CD4+ helper and CD8+ cytotoxic T-lymphocytes. . For additional information, please refer to https://education.[facility].com/faq/FAQ204 (This link is being provided for informational/ educational purposes only.) .            Lipid panel      Component Value Flag Ref Range Units Status   Cholesterol 114      <200 mg/dL Final   HDL 59      > OR = 50 mg/dL Final   Triglycerides 99      <150 mg/dL Final   LDL Cholesterol (Calc) 37       mg/dL (calc) Final   Comment:   Reference range: <100 . Desirable range <100 mg/dL for primary prevention;   <70 mg/dL for patients with CHD or diabetic patients  with > or = 2 CHD risk factors. SABRA LDL-C is now calculated using the Martin-Hopkins  calculation,  which is a validated novel method providing  better accuracy than the Friedewald equation in the  estimation of LDL-C.  Gladis APPLETHWAITE et al. SANDREA. 7986;689(80): 2061-2068  (http://education.[facility].com/faq/FAQ164)    Total CHOL/HDL Ratio 1.9      <5.0 (calc) Final   Non-HDL Cholesterol (Calc) 55      <130 mg/dL (calc) Final   Comment:   For patients with diabetes plus 1 major ASCVD risk  factor, treating to a non-HDL-C goal of <100 mg/dL  (LDL-C of <29 mg/dL) is considered a therapeutic  option.                   ASSESSMENT/PLAN:    ICD-10-CM   1. Diabetes mellitus type 2 without retinopathy (HCC)  E11.9 OCT, Retina - OU - Both Eyes    2. Encounter for long-term (current) use of insulin  (HCC)  Z79.4     3. Diabetes mellitus treated with oral medication (HCC)  E11.9    Z79.84     4. Intermediate stage nonexudative age-related macular degeneration of both eyes  H35.3132 OCT, Retina - OU - Both Eyes    5. Hypertensive retinopathy of both eyes  H35.033     6. Essential hypertension  I10     7. Posterior vitreous detachment of both eyes  H43.813     8. Pseudophakia of both eyes  Z96.1       1-3. Diabetes mellitus, type 2 without retinopathy  - exam with no MA or heme OU - The incidence, risk factors for progression, natural history and treatment options for diabetic retinopathy  were discussed with patient.   - The need for close monitoring of blood glucose, blood pressure, and serum lipids, avoiding cigarette or any type of tobacco, and the need for long term follow up was also discussed with patient. - f/u 9-12 months, sooner PRN -- DFE/OCT  4. Age related macular degeneration, non-exudative, both eyes  - intermediate stage with near confluent drusen and early atrophy OU -- stable  - no exudative disease on exam or OCT OU  - discussed ARMD as the most likely cause of visual symptoms  - The incidence, anatomy,  and pathology of dry AMD, risk of progression, and  the AREDS and AREDS 2 study including smoking risks discussed with patient.  - Recommend amsler grid monitoring  - f/u in 6-9 mos  5,6. Hypertensive retinopathy OU - discussed importance of tight BP control - monitor  7. PVD OU  - subacute symptomatic floaters, onset Sunday night 8.13.23  - subjective increase in floaters -- exam stable  - OCT shows interval release of VMA to full PVD OS  - Discussed findings and prognosis - No RT or RD on 360 peripheral exam OU - Reviewed s/s of RT/RD - Strict return precautions for any such RT/RD signs/symptoms - monitor  8. Pseudophakia OU  - s/p CE/IOL OU (R. Groat)  - IOLs in good position, doing well  - monitor  Ophthalmic Meds Ordered this visit:  No orders of the defined types were placed in this encounter.    Return for f/u 9-12 months, DM exam, DFE, OCT.  There are no Patient Instructions on file for this visit.   Explained the diagnoses, plan, and follow up with the patient and they expressed understanding.  Patient expressed understanding of the importance of proper follow up care.   This document serves as a record of services personally performed by Redell JUDITHANN Hans, MD, PhD. It was created on their behalf by Almetta Pesa, an ophthalmic technician. The creation of this record is the provider's dictation and/or activities during the visit.    Electronically signed by: Almetta Pesa, OA, 09/01/23  12:05 PM  This document serves as a record of services personally performed by Redell JUDITHANN Hans, MD, PhD. It was created on their behalf by Alan PARAS. Delores, OA an ophthalmic technician. The creation of this record is the provider's dictation and/or activities during the visit.    Electronically signed by: Alan PARAS. Delores, OA 09/01/23 12:05 PM  Redell JUDITHANN Hans, M.D., Ph.D. Diseases & Surgery of the Retina and Vitreous [facility]  I have reviewed the above documentation for accuracy and completeness, and I  agree with the above. Redell JUDITHANN Hans, M.D., Ph.D. 09/01/23 12:06 PM   Abbreviations: M myopia (nearsighted); A astigmatism; H hyperopia (farsighted); P presbyopia; Mrx spectacle prescription;  CTL contact lenses; OD right eye; OS left eye; OU both eyes  XT exotropia; ET esotropia; PEK punctate epithelial keratitis; PEE punctate epithelial erosions; DES dry eye syndrome; MGD meibomian gland dysfunction; ATs artificial tears; PFAT's preservative free artificial tears; NSC nuclear sclerotic cataract; PSC posterior subcapsular cataract; ERM epi-retinal membrane; PVD posterior vitreous detachment; RD retinal detachment; DM diabetes mellitus; DR diabetic retinopathy; NPDR non-proliferative diabetic retinopathy; PDR proliferative diabetic retinopathy; CSME clinically significant macular edema; DME diabetic macular edema; dbh dot blot hemorrhages; CWS cotton wool spot; POAG primary open angle glaucoma; C/D cup-to-disc ratio; HVF humphrey visual field; GVF goldmann visual field; OCT optical coherence tomography; IOP intraocular pressure; BRVO Branch retinal vein occlusion; CRVO central retinal vein occlusion; CRAO central retinal artery occlusion; BRAO branch retinal artery occlusion; RT retinal tear; SB scleral buckle; PPV pars plana vitrectomy; VH Vitreous hemorrhage; PRP panretinal laser photocoagulation; IVK intravitreal kenalog; VMT vitreomacular traction; MH Macular hole;  NVD neovascularization of the disc; NVE neovascularization elsewhere; AREDS age related eye disease study; ARMD age related macular degeneration; POAG primary open angle glaucoma; EBMD epithelial/anterior basement membrane dystrophy; ACIOL anterior chamber intraocular lens; IOL intraocular lens; PCIOL posterior chamber intraocular lens; Phaco/IOL phacoemulsification with intraocular lens placement; PRK photorefractive keratectomy; LASIK laser assisted in situ keratomileusis;  HTN hypertension; DM diabetes mellitus; COPD chronic obstructive  pulmonary disease

## 2023-08-22 ENCOUNTER — Other Ambulatory Visit (HOSPITAL_COMMUNITY): Payer: Self-pay

## 2023-08-26 ENCOUNTER — Telehealth: Payer: Self-pay | Admitting: Pharmacy Technician

## 2023-08-26 NOTE — Telephone Encounter (Signed)
 Pharmacy Patient Advocate Encounter   Received notification from CoverMyMeds that prior authorization for repatha  140mg  is required/requested.   Insurance verification completed.   The patient is insured through [employer] .   Per test claim: PA required; PA submitted to above mentioned insurance via CoverMyMeds Key/confirmation #/EOC [id number] Status is pending

## 2023-08-27 ENCOUNTER — Other Ambulatory Visit (HOSPITAL_COMMUNITY): Payer: Self-pay

## 2023-08-27 NOTE — Telephone Encounter (Signed)
 Pharmacy Patient Advocate Encounter  Received notification from [employer] that Prior Authorization for repatha  140mg  has been APPROVED from 08/26/23 to 02/25/24. Spoke to pharmacy to process.Copay is $0.00 on [employer] grant.    PA #/Case ID/Reference #: [id number]    I called patient and confirmed she wanted it shipped from our pharmacy and confirmed her mailing address

## 2023-08-28 ENCOUNTER — Encounter: Payer: Self-pay | Admitting: Physician Assistant

## 2023-08-28 ENCOUNTER — Ambulatory Visit (INDEPENDENT_AMBULATORY_CARE_PROVIDER_SITE_OTHER): Payer: Self-pay | Admitting: Ophthalmology

## 2023-08-28 ENCOUNTER — Ambulatory Visit: Payer: Self-pay | Attending: Physician Assistant | Admitting: Physician Assistant

## 2023-08-28 ENCOUNTER — Encounter (INDEPENDENT_AMBULATORY_CARE_PROVIDER_SITE_OTHER): Payer: Self-pay | Admitting: Ophthalmology

## 2023-08-28 VITALS — BP 148/74 | HR 91 | Resp 16 | Ht 65.0 in | Wt 112.3 lb

## 2023-08-28 DIAGNOSIS — F17200 Nicotine dependence, unspecified, uncomplicated: Secondary | ICD-10-CM

## 2023-08-28 DIAGNOSIS — M797 Fibromyalgia: Secondary | ICD-10-CM

## 2023-08-28 DIAGNOSIS — Z79899 Other long term (current) drug therapy: Secondary | ICD-10-CM | POA: Diagnosis not present

## 2023-08-28 DIAGNOSIS — K529 Noninfective gastroenteritis and colitis, unspecified: Secondary | ICD-10-CM

## 2023-08-28 DIAGNOSIS — M0600 Rheumatoid arthritis without rheumatoid factor, unspecified site: Secondary | ICD-10-CM

## 2023-08-28 DIAGNOSIS — Z794 Long term (current) use of insulin: Secondary | ICD-10-CM | POA: Diagnosis not present

## 2023-08-28 DIAGNOSIS — H353132 Nonexudative age-related macular degeneration, bilateral, intermediate dry stage: Secondary | ICD-10-CM | POA: Diagnosis not present

## 2023-08-28 DIAGNOSIS — M51369 Other intervertebral disc degeneration, lumbar region without mention of lumbar back pain or lower extremity pain: Secondary | ICD-10-CM

## 2023-08-28 DIAGNOSIS — Z8639 Personal history of other endocrine, nutritional and metabolic disease: Secondary | ICD-10-CM

## 2023-08-28 DIAGNOSIS — G629 Polyneuropathy, unspecified: Secondary | ICD-10-CM

## 2023-08-28 DIAGNOSIS — M5030 Other cervical disc degeneration, unspecified cervical region: Secondary | ICD-10-CM

## 2023-08-28 DIAGNOSIS — M67449 Ganglion, unspecified hand: Secondary | ICD-10-CM | POA: Diagnosis not present

## 2023-08-28 DIAGNOSIS — Z7984 Long term (current) use of oral hypoglycemic drugs: Secondary | ICD-10-CM | POA: Diagnosis not present

## 2023-08-28 DIAGNOSIS — Z961 Presence of intraocular lens: Secondary | ICD-10-CM

## 2023-08-28 DIAGNOSIS — L930 Discoid lupus erythematosus: Secondary | ICD-10-CM

## 2023-08-28 DIAGNOSIS — I10 Essential (primary) hypertension: Secondary | ICD-10-CM

## 2023-08-28 DIAGNOSIS — Z8709 Personal history of other diseases of the respiratory system: Secondary | ICD-10-CM

## 2023-08-28 DIAGNOSIS — E538 Deficiency of other specified B group vitamins: Secondary | ICD-10-CM

## 2023-08-28 DIAGNOSIS — H43813 Vitreous degeneration, bilateral: Secondary | ICD-10-CM

## 2023-08-28 DIAGNOSIS — Z8659 Personal history of other mental and behavioral disorders: Secondary | ICD-10-CM

## 2023-08-28 DIAGNOSIS — E119 Type 2 diabetes mellitus without complications: Secondary | ICD-10-CM

## 2023-08-28 DIAGNOSIS — H35033 Hypertensive retinopathy, bilateral: Secondary | ICD-10-CM

## 2023-08-28 DIAGNOSIS — Z111 Encounter for screening for respiratory tuberculosis: Secondary | ICD-10-CM

## 2023-08-28 DIAGNOSIS — M6740 Ganglion, unspecified site: Secondary | ICD-10-CM

## 2023-08-28 NOTE — Patient Instructions (Signed)

## 2023-08-29 ENCOUNTER — Ambulatory Visit: Payer: Self-pay | Admitting: Physician Assistant

## 2023-08-29 NOTE — Progress Notes (Signed)
 WBC count is elevated-12.6-absolute neutrophils and eosinophils are high.  Rest of CBC WNL. Any recent infections? Glucose is 118. GFR is borderline low-58-avoid the use of NSAIDs. Rest of CMP WNL Lipid panel WNL-please forward to PCP and cardiology as requested.

## 2023-08-31 LAB — COMPREHENSIVE METABOLIC PANEL WITH GFR
AG Ratio: 1.6 (calc) (ref 1.0–2.5)
ALT: 11 U/L (ref 6–29)
AST: 16 U/L (ref 10–35)
Albumin: 4.3 g/dL (ref 3.6–5.1)
Alkaline phosphatase (APISO): 56 U/L (ref 37–153)
BUN: 21 mg/dL (ref 7–25)
CO2: 26 mmol/L (ref 20–32)
Calcium: 9 mg/dL (ref 8.6–10.4)
Chloride: 98 mmol/L (ref 98–110)
Creat: 1 mg/dL (ref 0.60–1.00)
Globulin: 2.7 g/dL (ref 1.9–3.7)
Glucose, Bld: 118 mg/dL — ABNORMAL HIGH (ref 65–99)
Potassium: 4.1 mmol/L (ref 3.5–5.3)
Sodium: 136 mmol/L (ref 135–146)
Total Bilirubin: 0.6 mg/dL (ref 0.2–1.2)
Total Protein: 7 g/dL (ref 6.1–8.1)
eGFR: 58 mL/min/{1.73_m2} — ABNORMAL LOW (ref >=60)

## 2023-08-31 LAB — LIPID PANEL
Cholesterol: 114 mg/dL (ref <200)
HDL: 59 mg/dL (ref >=50)
LDL Cholesterol (Calc): 37 mg/dL
Non-HDL Cholesterol (Calc): 55 mg/dL (ref <130)
Total CHOL/HDL Ratio: 1.9 (calc) (ref <5.0)
Triglycerides: 99 mg/dL (ref <150)

## 2023-08-31 LAB — CBC WITH DIFFERENTIAL/PLATELET
Absolute Lymphocytes: 1424 {cells}/uL (ref 850–3900)
Absolute Monocytes: 743 {cells}/uL (ref 200–950)
Basophils Absolute: 139 {cells}/uL (ref 0–200)
Basophils Relative: 1.1 %
Eosinophils Absolute: 1373 {cells}/uL — ABNORMAL HIGH (ref 15–500)
Eosinophils Relative: 10.9 %
HCT: 43.4 % (ref 35.0–45.0)
Hemoglobin: 14.5 g/dL (ref 11.7–15.5)
MCH: 32.4 pg (ref 27.0–33.0)
MCHC: 33.4 g/dL (ref 32.0–36.0)
MCV: 96.9 fL (ref 80.0–100.0)
MPV: 9.5 fL (ref 7.5–12.5)
Monocytes Relative: 5.9 %
Neutro Abs: 8921 {cells}/uL — ABNORMAL HIGH (ref 1500–7800)
Neutrophils Relative %: 70.8 %
Platelets: 234 10*3/uL (ref 140–400)
RBC: 4.48 10*6/uL (ref 3.80–5.10)
RDW: 13.1 % (ref 11.0–15.0)
Total Lymphocyte: 11.3 %
WBC: 12.6 10*3/uL — ABNORMAL HIGH (ref 3.8–10.8)

## 2023-08-31 LAB — QUANTIFERON-TB GOLD PLUS
Mitogen-NIL: >10 [IU]/mL
NIL: 0.02 [IU]/mL
QuantiFERON-TB Gold Plus: NEGATIVE
TB1-NIL: 0 [IU]/mL
TB2-NIL: 0 [IU]/mL

## 2023-09-01 ENCOUNTER — Encounter (INDEPENDENT_AMBULATORY_CARE_PROVIDER_SITE_OTHER): Payer: Self-pay | Admitting: Ophthalmology

## 2023-09-01 NOTE — Progress Notes (Signed)
 TB gold negative

## 2023-09-23 ENCOUNTER — Other Ambulatory Visit (HOSPITAL_COMMUNITY): Payer: Self-pay

## 2023-10-07 NOTE — Progress Notes (Unsigned)
 Cardiology Office Note:   Date:  10/11/2023  ID:  [name], DOB 07/07/1945, MRN [id number] PCP: [name] Iha, MD  [facility] Providers Cardiologist:  [name] Schilling, MD {  History of Present Illness:   [name]  [name] is a 78 y.o. female She has a history of coronary artery disease.  She had catheterization in June 2023.  Right coronary artery has 40 and 60% stenosis.  PDA has 60% stenosis.  Second diagonal has 90% stenosis and had a stent.  There was nonobstructive 50% disease in the LAD.  Since I last saw her she was in the hospital in October with COVID.  I reviewed these records.  She is very upset because her sister was also hospitalized with COVID at the same time.  Her sister went home and died at a later suddenly.  She feels like she has had increased dyspnea since then and has been diagnosed with long COVID.  However, over the last couple of months her breathing has gotten even worse.  She is dyspneic walking short distance to her house.  She has had increased wheezing.  She is not describing cough fevers or chills.  She has not been having any chest pressure, neck or arm discomfort.  She has had some abdominal bloating at times and feels like she does not want to eat because of this.  She has not had any weight gain or edema.  In fact she has had weight loss and some anorexia.  She is very depressed about her sister.  ROS: As stated in the HPI and negative for all other systems.  Studies Reviewed:    EKG:   EKG Interpretation Date/Time:  Wednesday October 09 2023 16:15:51 EDT Ventricular Rate:  74 PR Interval:  184 QRS Duration:  78 QT Interval:  392 QTC Calculation: 435 R Axis:   90  Text Interpretation: Normal sinus rhythm When compared with ECG of 23-May-2022 17:47, Premature atrial complexes are no longer Confirmed by [name] [name] ([id number]) on 10/11/2023 1:08:03 PM     Risk Assessment/Calculations:         Physical Exam:   VS:  BP 106/70    Pulse 74   Ht 5' 5 (1.651 m)   Wt 113 lb (51.3 kg)   SpO2 (!) 84% Comment: 4:51 pm walked <1 minute  BMI 18.80 kg/m    Wt Readings from Last 3 Encounters:  10/09/23 113 lb (51.3 kg)  08/28/23 112 lb 4.8 oz (50.9 kg)  03/21/23 114 lb (51.7 kg)     GEN: Well nourished, well developed in no acute distress NECK: No JVD; No carotid bruits CARDIAC: RRR, however you have most those numbers murmurs, rubs, gallops RESPIRATORY: Decreased breath sounds with diffuse expiratory wheezes, no crackles ABDOMEN: Soft, non-tender, non-distended EXTREMITIES:  No edema; No deformity   SATURATION QUALIFICATIONS: (This note is used to comply with regulatory documentation for home oxygen)  Patient Saturations on Room Air at Rest = 90%  Patient Saturations on Room Air while Ambulating = 84%   Please briefly explain why patient needs home oxygen:  ASSESSMENT AND PLAN:   CAD:   The patient is having new shortness of breath.  I might need to exclude obstructive coronary disease given this but I will start with a workup as below.  I will have a low threshold for right and left heart cath.  She is really not having any symptoms that she had necessarily with her coronary disease but she does have significant  dyspnea.   HTN:   The blood pressure is at target.  No change in therapy.   Type 2 diabetes: This is followed by his primary provider.  A1c was most recently 5.7.   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  Her lipids are much improved on Repatha .   SOB: She is short of breath and hypoxemic as listed.  I suggested hospitalization and she adamantly refused.  I told her there is a risk of progressive respiratory failure and death.  She again refused.  I am going to evaluate with a BNP, echocardiogram, chest x-ray.  I am going to try to get oxygen ordered.  He might be having a COPD exacerbation although this could be an ischemic etiology.  This is less likely.  I  would do a right and left heart catheter as no other clear etiology.       Follow up with me in one month.   Signed, [name] Schilling, MD

## 2023-10-09 ENCOUNTER — Encounter: Payer: Self-pay | Admitting: Cardiology

## 2023-10-09 ENCOUNTER — Ambulatory Visit: Admitting: Cardiology

## 2023-10-09 VITALS — BP 106/70 | HR 74 | Ht 65.0 in | Wt 113.0 lb

## 2023-10-09 DIAGNOSIS — E118 Type 2 diabetes mellitus with unspecified complications: Secondary | ICD-10-CM

## 2023-10-09 DIAGNOSIS — I2510 Atherosclerotic heart disease of native coronary artery without angina pectoris: Secondary | ICD-10-CM

## 2023-10-09 DIAGNOSIS — I10 Essential (primary) hypertension: Secondary | ICD-10-CM | POA: Diagnosis not present

## 2023-10-09 DIAGNOSIS — R0602 Shortness of breath: Secondary | ICD-10-CM | POA: Diagnosis not present

## 2023-10-09 NOTE — Patient Instructions (Addendum)
 Medication Instructions:  Your physician recommends that you continue on your current medications as directed. Please refer to the Current Medication list given to you today.  Labwork: Your physician recommends that you return for lab work in: BNP & CBC.  Non-fasting [name]  Testing/Procedures: A chest x-ray takes a picture of the organs and structures inside the chest, including the heart, lungs, and blood vessels. This test can show several things, including, whether the heart is enlarges; whether fluid is building up in the lungs; and whether pacemaker / defibrillator leads are still in place. Your physician has requested that you have an echocardiogram. Echocardiography is a painless test that uses sound waves to create images of your heart. It provides your doctor with information about the size and shape of your heart and how well your heart's chambers and valves are working. This procedure takes approximately one hour. There are no restrictions for this procedure. Please do NOT wear cologne, perfume, aftershave, or lotions (deodorant is allowed). Please arrive 15 minutes prior to your appointment time.  Please note: We ask at that you not bring children with you during ultrasound (echo/ vascular) testing. Due to room size and safety concerns, children are not allowed in the ultrasound rooms during exams. Our front office staff cannot provide observation of children in our lobby area while testing is being conducted. An adult accompanying a patient to their appointment will only be allowed in the ultrasound room at the discretion of the ultrasound technician under special circumstances. We apologize for any inconvenience.  Follow-Up: Your physician recommends that you schedule a follow-up appointment in: 1 month  Any Other Special Instructions Will Be Listed Below (If Applicable). Your provider is ordering home oxygen therapy for you.  If you need a refill on your cardiac  medications before your next appointment, please call your pharmacy.

## 2023-10-10 ENCOUNTER — Telehealth: Payer: Self-pay | Admitting: Cardiology

## 2023-10-10 NOTE — Telephone Encounter (Signed)
 Called pt to sch her appointments from her visit with Dr. Lavona   Pt asked how she would be receiving the oxygen he ordered for her and would like to know if the refill for her Nitrp was sent in for a refill

## 2023-10-10 NOTE — Telephone Encounter (Signed)
 Advised patient that her oxygen saturation test needed to be done again in the office. Gave option to come to the [facility] office today for repeat testing but declined stating she didn't feel like coming out today. Advised that she did need to see a lung specialists and gave the number to contact [facility] at [place] [phone]). Encouraged her to seek care in the ED if her symptoms are worse but she declined stating she was okay and didn't need to go to the ED.

## 2023-10-11 ENCOUNTER — Other Ambulatory Visit (HOSPITAL_COMMUNITY)
Admission: RE | Admit: 2023-10-11 | Discharge: 2023-10-11 | Disposition: A | Discharge status: Home / Self Care | Source: Ambulatory Visit | Attending: Cardiology | Admitting: Cardiology

## 2023-10-11 ENCOUNTER — Ambulatory Visit (HOSPITAL_COMMUNITY)
Admission: RE | Admit: 2023-10-11 | Discharge: 2023-10-11 | Disposition: A | Discharge status: Home / Self Care | Source: Ambulatory Visit | Attending: Cardiology | Admitting: Cardiology

## 2023-10-11 DIAGNOSIS — I10 Essential (primary) hypertension: Secondary | ICD-10-CM

## 2023-10-11 DIAGNOSIS — E118 Type 2 diabetes mellitus with unspecified complications: Secondary | ICD-10-CM | POA: Insufficient documentation

## 2023-10-11 DIAGNOSIS — I2510 Atherosclerotic heart disease of native coronary artery without angina pectoris: Secondary | ICD-10-CM | POA: Insufficient documentation

## 2023-10-11 DIAGNOSIS — R0602 Shortness of breath: Secondary | ICD-10-CM | POA: Insufficient documentation

## 2023-10-11 LAB — CBC
HCT: 43.2 % (ref 36.0–46.0)
Hemoglobin: 14.6 g/dL (ref 12.0–15.0)
MCH: 32.2 pg (ref 26.0–34.0)
MCHC: 33.8 g/dL (ref 30.0–36.0)
MCV: 95.4 fL (ref 80.0–100.0)
Platelets: 239 K/uL (ref 150–400)
RBC: 4.53 MIL/uL (ref 3.87–5.11)
RDW: 12.8 % (ref 11.5–15.5)
WBC: 12.8 K/uL — ABNORMAL HIGH (ref 4.0–10.5)
nRBC: 0 % (ref 0.0–0.2)

## 2023-10-11 LAB — BRAIN NATRIURETIC PEPTIDE: B Natriuretic Peptide: 42 pg/mL (ref 0.0–100.0)

## 2023-10-13 ENCOUNTER — Ambulatory Visit: Payer: Self-pay | Admitting: Cardiology

## 2023-10-13 DIAGNOSIS — R0602 Shortness of breath: Secondary | ICD-10-CM

## 2023-10-31 ENCOUNTER — Ambulatory Visit: Attending: Cardiology

## 2023-10-31 DIAGNOSIS — I10 Essential (primary) hypertension: Secondary | ICD-10-CM | POA: Diagnosis not present

## 2023-10-31 DIAGNOSIS — R0602 Shortness of breath: Secondary | ICD-10-CM | POA: Diagnosis not present

## 2023-10-31 DIAGNOSIS — E118 Type 2 diabetes mellitus with unspecified complications: Secondary | ICD-10-CM | POA: Diagnosis not present

## 2023-10-31 DIAGNOSIS — I2510 Atherosclerotic heart disease of native coronary artery without angina pectoris: Secondary | ICD-10-CM

## 2023-10-31 LAB — ECHOCARDIOGRAM COMPLETE
AR max vel: 2.41 cm2
AV Peak grad: 4.7 mmHg
Ao pk vel: 1.08 m/s
Area-P 1/2: 2.8 cm2
Calc EF: 61.6 %
S' Lateral: 2.6 cm
Single Plane A2C EF: 62 %
Single Plane A4C EF: 63.8 %

## 2023-11-05 NOTE — Telephone Encounter (Signed)
-----   Message from Lynwood Schilling sent at 11/03/2023  4:15 PM EDT ----- Her EF is well preserved.  There are no significant valvular abnormalities. BNP was normal.   The chest Xray demonstrated emphysema.  I would suggest PFTs as the next step.   I would like for her to  see Dr. Darlean for evaluation of SOB.  ----- Message ----- From: Interface, Three One Seven Sent: 10/31/2023   5:07 PM EDT To: Lynwood Schilling, MD

## 2023-11-08 ENCOUNTER — Other Ambulatory Visit: Payer: Self-pay | Admitting: Cardiology

## 2023-11-08 DIAGNOSIS — R0602 Shortness of breath: Secondary | ICD-10-CM | POA: Insufficient documentation

## 2023-11-08 NOTE — Progress Notes (Signed)
 Cardiology Office Note:   Date:  11/12/2023  ID:  Molli  [name], DOB November 26, 1945, MRN [id number] PCP: Theo Iha, MD  [facility] Providers Cardiologist:  Lynwood Schilling, MD {  History of Present Illness:   [name]  DESTIN [name] is a 78 y.o. female a history of coronary artery disease.  She had catheterization in June 2023.  Right coronary artery has 40 and 60% stenosis.  PDA has 60% stenosis.  Second diagonal has 90% stenosis and had a stent.  There was nonobstructive 50% disease in the LAD.   At the last visit I tried to get her to come into the hospital but she did not want to.  CXR demonstrated emphysema .  Echo demonstrated a preserved EF.  BNP was normal.    I have set her up for PFTs and to see pulmonary.  [facility]  She was turned down for home oxygen.  She is still getting dyspneic.  Now she is describing some discomfort that goes across her back shoulders and a little bit up to her jaw when she is particularly short of breath with activity such as vacuuming or making the bed.  She is not describing PND or orthopnea.  She is not describing palpitations, presyncope or syncope.  She has abdominal bloating and decreased appetite and has had some weight loss and anorexia.  She has been battling depression.   ROS: As stated in the HPI and negative for all other systems.  Studies Reviewed:    EKG:     NA  Risk Assessment/Calculations:         Physical Exam:   VS:  BP (!) 158/78 (BP Location: Left Arm, Patient Position: Sitting, Cuff Size: Normal)   Pulse 80   Ht 5' 5 (1.651 m)   Wt 111 lb 9.6 oz (50.6 kg)   BMI 18.57 kg/m    Wt Readings from Last 3 Encounters:  11/12/23 111 lb 9.6 oz (50.6 kg)  10/09/23 113 lb (51.3 kg)  08/28/23 112 lb 4.8 oz (50.9 kg)     GEN: Well nourished, well developed in no acute distress NECK: No JVD; No carotid bruits CARDIAC: RRR, no murmurs, rubs, gallops RESPIRATORY: Decreased breath sounds with scattered  wheezes ABDOMEN: Soft, non-tender, non-distended EXTREMITIES:  No edema; No deformity   ASSESSMENT AND PLAN:   CAD:   Given the new description of some shoulder discomfort and jaw discomfort I do want to exclude progressive coronary disease though I still suspect the etiology for her dyspnea is more her emphysema and ongoing tobacco abuse.  I think the wheezing precludes perfusion imaging.  Despite the fact that she has had stenting of her know I am I would like to order a coronary CT to see if the proximal RCA or LAD stenosis has obviously progressed.  She has a contrast allergy and will need to be premedicated.  HTN:   The blood pressure is elevated.  This is actually unusual and she can keep a blood pressure diary.   Type 2 diabetes:   A1c is 5.7.  No change in therapy.    Statin intolerance: She is genetically identified as intolerant to statins.  She is on Repatha .   SOB: As above I am going to exclude high-grade large vessel proximal obstructive coronary disease and if that is not the etiology this is almost definitely emphysema and the plan is pulmonary follow-up and PFTs as above.   Follow up with me in six months.   Signed,  Lynwood Schilling, MD

## 2023-11-08 NOTE — Addendum Note (Signed)
 Addended by: Murry Khiev N on: 11/08/2023 09:07 AM   Modules accepted: Orders

## 2023-11-11 ENCOUNTER — Other Ambulatory Visit: Payer: Self-pay | Admitting: Cardiology

## 2023-11-11 ENCOUNTER — Other Ambulatory Visit (HOSPITAL_COMMUNITY): Payer: Self-pay

## 2023-11-11 DIAGNOSIS — I2510 Atherosclerotic heart disease of native coronary artery without angina pectoris: Secondary | ICD-10-CM

## 2023-11-11 DIAGNOSIS — E785 Hyperlipidemia, unspecified: Secondary | ICD-10-CM

## 2023-11-12 ENCOUNTER — Ambulatory Visit: Attending: Cardiology | Admitting: Cardiology

## 2023-11-12 ENCOUNTER — Other Ambulatory Visit (HOSPITAL_COMMUNITY): Payer: Self-pay

## 2023-11-12 ENCOUNTER — Encounter: Payer: Self-pay | Admitting: Cardiology

## 2023-11-12 ENCOUNTER — Other Ambulatory Visit: Payer: Self-pay

## 2023-11-12 VITALS — BP 158/78 | HR 80 | Ht 65.0 in | Wt 111.6 lb

## 2023-11-12 DIAGNOSIS — I2510 Atherosclerotic heart disease of native coronary artery without angina pectoris: Secondary | ICD-10-CM | POA: Diagnosis not present

## 2023-11-12 DIAGNOSIS — R0602 Shortness of breath: Secondary | ICD-10-CM | POA: Diagnosis not present

## 2023-11-12 DIAGNOSIS — I10 Essential (primary) hypertension: Secondary | ICD-10-CM

## 2023-11-12 DIAGNOSIS — Z01812 Encounter for preprocedural laboratory examination: Secondary | ICD-10-CM

## 2023-11-12 DIAGNOSIS — E118 Type 2 diabetes mellitus with unspecified complications: Secondary | ICD-10-CM

## 2023-11-12 MED ORDER — DIPHENHYDRAMINE HCL 50 MG PO TABS: ORAL_TABLET | ORAL | 0 refills | Status: DC

## 2023-11-12 MED ORDER — REPATHA SURECLICK 140 MG/ML ~~LOC~~ SOAJ
140.0000 mg | SUBCUTANEOUS | 3 refills | Status: DC
  Filled 2023-11-12: qty 6, 84d supply, fill #0

## 2023-11-12 MED ORDER — METOPROLOL TARTRATE 100 MG PO TABS: ORAL_TABLET | ORAL | 0 refills | Status: AC

## 2023-11-12 MED ORDER — PREDNISONE 50 MG PO TABS: ORAL_TABLET | ORAL | 0 refills | Status: DC

## 2023-11-12 NOTE — Patient Instructions (Addendum)
 Medication Instructions:  Your physician recommends that you continue on your current medications as directed. Please refer to the Current Medication list given to you today.  *If you need a refill on your cardiac medications before your next appointment, please call your pharmacy*  Lab Work: BMET prior to CT If you have labs (blood work) drawn today and your tests are completely normal, you will receive your results only by: MyChart Message (if you have MyChart) OR A paper copy in the mail If you have any lab test that is abnormal or we need to change your treatment, we will call you to review the results.  Testing/Procedures: Coronary CTA  Follow-Up: At [facility], you and your health needs are our priority.  As part of our continuing mission to provide you with exceptional heart care, our providers are all part of one team.  This team includes your primary Cardiologist (physician) and Advanced Practice Providers or APPs (Physician Assistants and Nurse Practitioners) who all work together to provide you with the care you need, when you need it.  Your next appointment:   6 months  Provider:   Lavona, MD  We recommend signing up for the patient portal called MyChart.  Sign up information is provided on this After Visit Summary.  MyChart is used to connect with patients for Virtual Visits (Telemedicine).  Patients are able to view lab/test results, encounter notes, upcoming appointments, etc.  Non-urgent messages can be sent to your provider as well.   To learn more about what you can do with MyChart, go to [email or web address].   Other Instructions   Your cardiac CT will be scheduled at one of the below locations:   [facility] [address] [place], [name] [address] [phone] (Severe contrast allergies only)  Or   [facility] [address]  [place], [name] [address] [phone]   If scheduled at [facility], please arrive at the [facility] Entrance (Entrance C2) of [facility] 30 minutes prior to test start time. You can use the FREE valet parking offered at entrance C (encouraged to control the heart rate for the test)  Proceed to the [facility] Radiology Department (first floor) to check-in and test prep.  All radiology patients and guests should use entrance C2 at [facility], accessed from [facility], even though the hospital's physical address listed is [address].  If scheduled at the Heart and Vascular Tower at [address], please enter the parking lot using the [address] entrance and use the FREE valet service at the patient drop-off area. Enter the building and check-in with registration on the main floor.   Please follow these instructions carefully (unless otherwise directed):  An IV will be required for this test and Nitroglycerin  will be given.  Hold all erectile dysfunction medications at least 3 days (72 hrs) prior to test. (Ie viagra, cialis, sildenafil, tadalafil, etc)   On the Night Before the Test: Be sure to Drink plenty of water. Do not consume any caffeinated/decaffeinated beverages or chocolate 12 hours prior to your test. Do not take any antihistamines 12 hours prior to your test.  If the patient has contrast allergy: Patient will need a prescription for Prednisone  and very clear instructions (as follows): Prednisone  50 mg - take 13 hours prior to test Take another Prednisone  50 mg 7 hours prior to test Take another Prednisone  50 mg 1 hour prior  to test Take Benadryl  50 mg 1 hour prior to test Patient must complete all four doses of above prophylactic medications. Patient will need a ride after test due to Benadryl .  On the Day of the Test: Drink plenty of water until 1 hour prior to the test. Do not eat any food 1 hour prior to test. You may take your regular medications prior to the  test.  Take metoprolol  (Lopressor ) two hours prior to test. If you take Furosemide/Hydrochlorothiazide /Spironolactone/Chlorthalidone, please HOLD on the morning of the test. Patients who wear a continuous glucose monitor MUST remove the device prior to scanning. FEMALES- please wear underwire-free bra if available, avoid dresses & tight clothing       After the Test: Drink plenty of water. After receiving IV contrast, you may experience a mild flushed feeling. This is normal. On occasion, you may experience a mild rash up to 24 hours after the test. This is not dangerous. If this occurs, you can take Benadryl  25 mg, Zyrtec, Claritin, or Allegra and increase your fluid intake. (Patients taking Tikosyn should avoid Benadryl , and may take Zyrtec, Claritin, or Allegra) If you experience trouble breathing, this can be serious. If it is severe call 911 IMMEDIATELY. If it is mild, please call our office.  We will call to schedule your test 2-4 weeks out understanding that some insurance companies will need an authorization prior to the service being performed.   For more information and frequently asked questions, please visit our website : [email or web address]  For non-scheduling related questions, please contact the cardiac imaging nurse navigator should you have any questions/concerns: Cardiac Imaging Nurse Navigators Direct Office Dial: [phone]   For scheduling needs, including cancellations and rescheduling, please call [place], [phone].

## 2023-11-18 ENCOUNTER — Other Ambulatory Visit: Payer: Self-pay

## 2023-11-18 MED ORDER — ENBREL MINI 50 MG/ML ~~LOC~~ SOCT: 50.0000 mg | SUBCUTANEOUS | 0 refills | Status: DC

## 2023-11-18 NOTE — Telephone Encounter (Signed)
 Patient contacted the office and requests a refill of Enbrel  sent to [employer].   Last Fill: 08/19/2023  Labs: 08/28/2023 WBC count is elevated-12.6-absolute neutrophils and eosinophils are high.  Rest of CBC WNL. Any recent infections? Glucose is 118. GFR is borderline low-58-avoid the use of NSAIDs. Rest of CMP WNL Lipid panel WNL-please forward to PCP and cardiology as requested.   TB Gold: 08/28/2023  TB gold negative   Next Visit: 02/13/2024  Last Visit: 08/28/2023  IK:Dzmnwzhjupcz rheumatoid arthritis (HCC)   Current Dose per office note 08/28/2023: Enbrel  50 mg sq injections once weekly   Patient states she plans to get some labs drawn tomorrow for her cardiologist and that they should be what we need.   Okay to refill Enbrel ?

## 2023-11-19 ENCOUNTER — Ambulatory Visit (HOSPITAL_COMMUNITY): Admission: RE | Admit: 2023-11-19 | Source: Ambulatory Visit

## 2023-11-20 LAB — BASIC METABOLIC PANEL WITH GFR
BUN/Creatinine Ratio: 15 (ref 12–28)
BUN: 16 mg/dL (ref 8–27)
CO2: 26 mmol/L (ref 20–29)
Calcium: 8.9 mg/dL (ref 8.7–10.3)
Chloride: 93 mmol/L — ABNORMAL LOW (ref 96–106)
Creatinine, Ser: 1.08 mg/dL — ABNORMAL HIGH (ref 0.57–1.00)
Glucose: 105 mg/dL — ABNORMAL HIGH (ref 70–99)
Potassium: 4.1 mmol/L (ref 3.5–5.2)
Sodium: 133 mmol/L — ABNORMAL LOW (ref 134–144)
eGFR: 53 mL/min/1.73 — ABNORMAL LOW (ref >59)

## 2023-11-21 ENCOUNTER — Ambulatory Visit: Payer: Self-pay | Admitting: Cardiology

## 2023-11-21 ENCOUNTER — Ambulatory Visit (HOSPITAL_COMMUNITY)
Admission: RE | Admit: 2023-11-21 | Discharge: 2023-11-21 | Disposition: A | Discharge status: Home / Self Care | Source: Ambulatory Visit | Attending: Cardiology | Admitting: Cardiology

## 2023-11-21 DIAGNOSIS — I25119 Atherosclerotic heart disease of native coronary artery with unspecified angina pectoris: Secondary | ICD-10-CM | POA: Diagnosis not present

## 2023-11-21 DIAGNOSIS — I10 Essential (primary) hypertension: Secondary | ICD-10-CM | POA: Insufficient documentation

## 2023-11-21 DIAGNOSIS — Z01810 Encounter for preprocedural cardiovascular examination: Secondary | ICD-10-CM | POA: Diagnosis present

## 2023-11-21 DIAGNOSIS — R0602 Shortness of breath: Secondary | ICD-10-CM

## 2023-11-21 DIAGNOSIS — I2510 Atherosclerotic heart disease of native coronary artery without angina pectoris: Secondary | ICD-10-CM

## 2023-11-21 DIAGNOSIS — E118 Type 2 diabetes mellitus with unspecified complications: Secondary | ICD-10-CM | POA: Diagnosis not present

## 2023-11-21 DIAGNOSIS — Z955 Presence of coronary angioplasty implant and graft: Secondary | ICD-10-CM | POA: Insufficient documentation

## 2023-11-21 DIAGNOSIS — Z01812 Encounter for preprocedural laboratory examination: Secondary | ICD-10-CM

## 2023-11-21 MED ORDER — IOHEXOL 350 MG/ML SOLN
100.0000 mL | Freq: Once | INTRAVENOUS | Status: AC | PRN
Start: 2023-11-21 — End: 2023-11-21
  Administered 2023-11-21: 100 mL via INTRAVENOUS

## 2023-11-21 MED ORDER — NITROGLYCERIN 0.4 MG SL SUBL
0.8000 mg | SUBLINGUAL_TABLET | Freq: Once | SUBLINGUAL | Status: AC
  Administered 2023-11-21: 0.8 mg via SUBLINGUAL

## 2023-11-22 ENCOUNTER — Ambulatory Visit (HOSPITAL_COMMUNITY)
Admission: RE | Admit: 2023-11-22 | Discharge: 2023-11-22 | Disposition: A | Discharge status: Home / Self Care | Source: Ambulatory Visit | Attending: Internal Medicine | Admitting: Internal Medicine

## 2023-11-22 ENCOUNTER — Other Ambulatory Visit: Payer: Self-pay | Admitting: Internal Medicine

## 2023-11-22 DIAGNOSIS — R931 Abnormal findings on diagnostic imaging of heart and coronary circulation: Secondary | ICD-10-CM | POA: Insufficient documentation

## 2023-11-22 DIAGNOSIS — I2510 Atherosclerotic heart disease of native coronary artery without angina pectoris: Secondary | ICD-10-CM | POA: Diagnosis not present

## 2023-11-24 ENCOUNTER — Ambulatory Visit: Payer: Self-pay | Admitting: Cardiology

## 2023-12-24 ENCOUNTER — Other Ambulatory Visit: Payer: Self-pay

## 2023-12-24 MED ORDER — ENBREL MINI 50 MG/ML ~~LOC~~ SOCT: 50.0000 mg | SUBCUTANEOUS | 0 refills | Status: DC

## 2023-12-24 NOTE — Telephone Encounter (Signed)
 Refill request received via fax from [employer] for Enbrel   Last Fill: 11/18/2023  Labs: 11/19/2023 BMP w/ GFR Glucose 105 Creatinine 1.08 eGFR 53 Sodium 133 Chloride 93  10/11/2023 CBC WBC 12.8  TB Gold: 08/28/2023  TB gold negative   Next Visit: 02/13/2024  Last Visit: 08/28/2023  IK:Dzmnwzhjupcz rheumatoid arthritis (HCC)   Current Dose per office note 08/28/2023: Enbrel  50 mg sq injections once weekly   Okay to refill Enbrel ?

## 2024-01-14 ENCOUNTER — Other Ambulatory Visit: Payer: Self-pay | Admitting: *Deleted

## 2024-01-14 DIAGNOSIS — Z79899 Other long term (current) drug therapy: Secondary | ICD-10-CM

## 2024-01-21 ENCOUNTER — Other Ambulatory Visit: Payer: Self-pay | Admitting: *Deleted

## 2024-01-21 ENCOUNTER — Other Ambulatory Visit: Payer: Self-pay

## 2024-01-21 DIAGNOSIS — R0602 Shortness of breath: Secondary | ICD-10-CM

## 2024-01-21 DIAGNOSIS — Z79899 Other long term (current) drug therapy: Secondary | ICD-10-CM

## 2024-01-21 DIAGNOSIS — Z8709 Personal history of other diseases of the respiratory system: Secondary | ICD-10-CM

## 2024-01-22 ENCOUNTER — Other Ambulatory Visit: Payer: Self-pay | Admitting: *Deleted

## 2024-01-22 ENCOUNTER — Ambulatory Visit: Payer: Self-pay | Admitting: Rheumatology

## 2024-01-22 MED ORDER — ENBREL MINI 50 MG/ML ~~LOC~~ SOCT: 50.0000 mg | SUBCUTANEOUS | 0 refills | Status: AC

## 2024-01-22 NOTE — Telephone Encounter (Signed)
 Patient requesting refill on Enbrel   Last Fill: 12/24/2023 (30 day supply)  Labs: 01/21/2024 Glucose is mildly elevated probably not a fasting sample. Creatinine is mildly elevated and stable. Sodium and chloride remain low, CBC normal and stable.   TB Gold: 08/28/2023 Neg    Next Visit: 02/13/2024  Last Visit: 08/28/2023  IK:Dzmnwzhjupcz rheumatoid arthritis   Current Dose per office note 08/28/2023: Enbrel  50 mg sq injections once weekly   Okay to refill Enbrel ?

## 2024-01-22 NOTE — Progress Notes (Signed)
 Glucose is mildly elevated probably not a fasting sample.  Creatinine is mildly elevated and stable.  Sodium and chloride remain low, CBC normal and stable.  ANCA and IgE values pending.

## 2024-01-27 ENCOUNTER — Telehealth: Payer: Self-pay

## 2024-01-27 NOTE — Telephone Encounter (Signed)
 Patient due for Enbrel  PAP renewal throuhg [employer]. Application mailed to her home with letter to return to office or bring to upcoming OV on 02/13/2024  Prescriber form placed in Dr. Jammie folder for signature  Sherry Pennant, PharmD, MPH, BCPS, CPP Clinical Pharmacist [facility])

## 2024-01-28 LAB — CBC WITH DIFFERENTIAL/PLATELET
Absolute Lymphocytes: 1519 {cells}/uL (ref 850–3900)
Absolute Monocytes: 556 {cells}/uL (ref 200–950)
Basophils Absolute: 118 {cells}/uL (ref 0–200)
Basophils Relative: 1.1 %
Eosinophils Absolute: 535 {cells}/uL — ABNORMAL HIGH (ref 15–500)
Eosinophils Relative: 5 %
HCT: 42.3 % (ref 35.0–45.0)
Hemoglobin: 14.3 g/dL (ref 11.7–15.5)
MCH: 31.7 pg (ref 27.0–33.0)
MCHC: 33.8 g/dL (ref 32.0–36.0)
MCV: 93.8 fL (ref 80.0–100.0)
MPV: 9.6 fL (ref 7.5–12.5)
Monocytes Relative: 5.2 %
Neutro Abs: 7972 {cells}/uL — ABNORMAL HIGH (ref 1500–7800)
Neutrophils Relative %: 74.5 %
Platelets: 198 Thousand/uL (ref 140–400)
RBC: 4.51 Million/uL (ref 3.80–5.10)
RDW: 12.7 % (ref 11.0–15.0)
Total Lymphocyte: 14.2 %
WBC: 10.7 Thousand/uL (ref 3.8–10.8)

## 2024-01-28 LAB — RFLX ATYPICAL P-ANCA TITER: ATYPICAL P ANCA TITER: 1:40 {titer} — ABNORMAL HIGH

## 2024-01-28 LAB — COMPREHENSIVE METABOLIC PANEL WITH GFR
AG Ratio: 1.3 (calc) (ref 1.0–2.5)
ALT: 11 U/L (ref 6–29)
AST: 18 U/L (ref 10–35)
Albumin: 4.2 g/dL (ref 3.6–5.1)
Alkaline phosphatase (APISO): 70 U/L (ref 37–153)
BUN/Creatinine Ratio: 20 (calc) (ref 6–22)
BUN: 21 mg/dL (ref 7–25)
CO2: 29 mmol/L (ref 20–32)
Calcium: 9.2 mg/dL (ref 8.6–10.4)
Chloride: 94 mmol/L — ABNORMAL LOW (ref 98–110)
Creat: 1.04 mg/dL — ABNORMAL HIGH (ref 0.60–1.00)
Globulin: 3.3 g/dL (ref 1.9–3.7)
Glucose, Bld: 102 mg/dL — ABNORMAL HIGH (ref 65–99)
Potassium: 3.9 mmol/L (ref 3.5–5.3)
Sodium: 134 mmol/L — ABNORMAL LOW (ref 135–146)
Total Bilirubin: 0.5 mg/dL (ref 0.2–1.2)
Total Protein: 7.5 g/dL (ref 6.1–8.1)
eGFR: 55 mL/min/1.73m2 — ABNORMAL LOW (ref >=60)

## 2024-01-28 LAB — IGE: IgE (Immunoglobulin E), Serum: 64 kU/L (ref <=114)

## 2024-01-28 LAB — ANCA SCREEN W REFLEX TITER: ANCA SCREEN: POSITIVE — AB

## 2024-01-28 NOTE — Progress Notes (Signed)
 Creatinine is mildly elevated and stable.  Sodium and chloride are low and stable.  CBC is stable.  ANCA low titer positive.  IgE is normal.  Please forward results to Dr. Alaine ([facility])

## 2024-01-28 NOTE — Telephone Encounter (Signed)
 Received signed provider from Dr. Dolphus. Placed in PAP pending info folder in [place]'s office

## 2024-02-03 NOTE — Progress Notes (Unsigned)
 Office Visit Note  Patient: [name]  [name] [name]             Date of Birth: [birth year]           MRN: [id number]             PCP: [name] Iha, MD Referring: [name] Iha, MD Visit Date: 02/13/2024 Occupation: Data Unavailable  Subjective:  Shortness of breath  History of Present Illness: [name]  HERMINA [name] is a 78 y.o. female with seronegative rheumatoid arthritis.  She returns today after her last visit in June 2025.  She states for the last 4 months she has been having increased shortness of breath.  She had extensive cardiology workup which was negative.  She was referred to Dr. Mcquaid for chronic cough and fatigue since the COVID-19 infection.  He states that her breathing got worse after starting Repatha  injections.  She decided to come off Repatha  injections.  She had a chest x-ray on October 11, 2023 which was suggestive of bilateral interstitial opacities and hyperinflated lung suggestive of emphysema.  She had high-resolution CT on February 04, 2024 which showed a scattered area of nodular consolidation within the right upper lobe and right lower lobe reflecting multifocal pneumonia versus organizing pneumonia per radiology report.  She started doxycycline last week.  She states Dr. Alaine recommended not to stop Enbrel .  She is holding Enbrel  until she will finish doxycycline.  She states she took prednisone  taper in September but did not notice a difference.    Activities of Daily Living:  Patient reports morning stiffness for 30 minutes.   Patient Reports nocturnal pain.  Difficulty dressing/grooming: Denies Difficulty climbing stairs: Reports Difficulty getting out of chair: Reports Difficulty using hands for taps, buttons, cutlery, and/or writing: Reports  Review of Systems  Constitutional:  Positive for fatigue.  HENT:  Positive for mouth dryness. Negative for mouth sores.   Eyes:  Negative for dryness.  Respiratory:  Positive for shortness of breath.    Cardiovascular:  Negative for chest pain and palpitations.  Gastrointestinal:  Negative for blood in stool, constipation and diarrhea.  Endocrine: Negative for increased urination.  Genitourinary:  Negative for involuntary urination.  Musculoskeletal:  Positive for joint pain, gait problem, joint pain, muscle weakness and morning stiffness. Negative for joint swelling, myalgias, muscle tenderness and myalgias.  Skin:  Positive for hair loss. Negative for color change, rash and sensitivity to sunlight.  Allergic/Immunologic: Positive for susceptible to infections.  Neurological:  Negative for dizziness and headaches.  Hematological:  Negative for swollen glands.  Psychiatric/Behavioral:  Negative for depressed mood and sleep disturbance. The patient is not nervous/anxious.     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   Gastroesophageal reflux disease without esophagitis 11/27/2019   Generalized anxiety disorder 11/27/2019   Hypothyroidism 11/27/2019   Smoker 11/27/2019   Type 2 diabetes mellitus without complication 11/27/2019   Varicose veins of lower extremity 11/27/2019   Vitamin D deficiency 11/27/2019   Arthritis of hand 11/05/2019   Inflammation of joint of both hands 11/05/2019   Encounter for orthopedic follow-up care 01/09/2019   Tenosynovitis of fingers 09/22/2018   Tenosynovitis  of right wrist 09/22/2018   Pain in right hand 09/08/2018   Nonintractable headache 08/16/2016   Carotid artery tenderness 10/26/2015   Leg pain, left 07/27/2014   Back pain of thoracolumbar region 08/11/2012   Renal cyst 08/04/2012    Cervical spondylosis with radiculopathy 01/24/2012   Chronic interstitial cystitis 11/01/2011   Mixed incontinence 11/01/2011   EMPHYSEMA 08/05/2008   LEUKOCYTOSIS 06/25/2008   COPD 06/25/2008   GASTROPARESIS 06/25/2008   DM 06/14/2008   Hyperlipidemia LDL goal <70 06/14/2008   Essential hypertension 06/14/2008   ASTHMATIC BRONCHITIS, ACUTE 06/14/2008   ALLERGIC RHINITIS 06/14/2008   LUPUS 06/14/2008    Past Medical History:  Diagnosis Date   Arthritis    little bit qwhere (01/24/2012)   Asthma    Chronic back pain    neck to tailbone (01/24/2012)   COPD (chronic obstructive pulmonary disease) (HCC)    COVID 12/2022   Depression    takes Zoloft  daily   Fibromyalgia    History of bladder infections    sees a urologist--on long term Macrodantin  nightly   History of blood transfusion    when I was a child (01/24/2012)   Hyperlipidemia    takes Simvastatin  daily   Hypertension    takes Maxzide  daily   Hypothyroidism    takes Synthroid  daily   Insomnia    takes Ambien  nightly   Lupus    Neck pain    herniated disc and radiculopathy   Peripheral neuropathy    Pneumonia 03/05/2010   have had it a couple times (01/24/2012)   Post-COVID syndrome    Type II diabetes mellitus (HCC)    takes Janumet  daily    Family History  Problem Relation Age of Onset   Heart disease Mother 4       Congestive HF, CABG   Arrhythmia Father    Diabetes Sister    Heart disease Sister    Blindness Brother    Heart attack Brother    Diabetes Brother    Past Surgical History:  Procedure Laterality Date   ANTERIOR CERVICAL DECOMP/DISCECTOMY FUSION  2002; 01/24/2012   ANTERIOR CERVICAL DECOMP/DISCECTOMY FUSION  01/24/2012   Procedure: ANTERIOR CERVICAL DECOMPRESSION/DISCECTOMY FUSION 1 LEVEL;  Surgeon: Reyes JONETTA Budge, MD;  Location: [facility];  Service: Neurosurgery;  Laterality: N/A;  CERVICAL THREE-FOUR anterior cervical decompression with fusion interbody prothesis  plating and bonegraft   APPENDECTOMY  11/04/1958   BACK SURGERY  03/06/1999   BLADDER SURGERY  03/06/1975   through abdomen   BLADDER SUSPENSION  03/05/1972   BUNIONECTOMY  11/04/1978   left   CARDIAC CATHETERIZATION  03/05/2006   CATARACT EXTRACTION     COLONOSCOPY     CORONARY STENT INTERVENTION N/A 09/01/2021   Procedure: CORONARY STENT INTERVENTION;  Surgeon: Verlin Lonni JONETTA, MD;  Location: [facility];  Service: Cardiovascular;  Laterality: N/A;   ESOPHAGOGASTRODUODENOSCOPY     LEFT HEART CATH AND CORONARY ANGIOGRAPHY N/A 09/01/2021   Procedure: LEFT HEART CATH AND CORONARY ANGIOGRAPHY;  Surgeon: Verlin Lonni JONETTA, MD;  Location: [facility];  Service: Cardiovascular;  Laterality: N/A;   LUMBAR FUSION  03/06/2007   MOUTH SURGERY     growth removed inside mouth per patient   RECTOCELE REPAIR  03/05/1998   VAGINAL HYSTERECTOMY  03/05/1972   Social History   Tobacco Use   Smoking status: Every Day    Current packs/day: 1.00    Average packs/day: 1 pack/day for 60.0 years (60.0 ttl pk-yrs)  Types: Cigarettes    Passive exposure: Never   Smokeless tobacco: Never   Tobacco comments:    6-7 cigarettes per day   Vaping Use   Vaping status: Former  Substance Use Topics   Alcohol use: No   Drug use: No   Social History   Social History Narrative   Right Handed    Lives in a one story home      Immunization History  Administered Date(s) Administered   INFLUENZA, HIGH DOSE SEASONAL PF 01/01/2017   Influenza Split 01/25/2012   Influenza-Unspecified 12/03/2012, 01/01/2017   Pneumococcal Conjugate-13 12/17/2017   Pneumococcal Polysaccharide-23 01/25/2012   Tdap 04/14/2014, 01/29/2021     Objective: Vital Signs: BP 137/82   Pulse 77   Temp (!) 97 F (36.1 C)   Resp 18   Ht 5' 5 (1.651 m)   Wt 113 lb 6.4 oz (51.4 kg)   BMI 18.87 kg/m    Physical Exam Vitals and nursing note reviewed.  Constitutional:      Appearance: She is  well-developed.  HENT:     Head: Normocephalic and atraumatic.  Eyes:     Conjunctiva/sclera: Conjunctivae normal.  Cardiovascular:     Rate and Rhythm: Normal rate and regular rhythm.     Heart sounds: Normal heart sounds.  Pulmonary:     Effort: Pulmonary effort is normal.     Breath sounds: Normal breath sounds.     Comments: Wheezing was audible in the right lung base. Abdominal:     General: Bowel sounds are normal.     Palpations: Abdomen is soft.  Musculoskeletal:     Cervical back: Normal range of motion.  Lymphadenopathy:     Cervical: No cervical adenopathy.  Skin:    General: Skin is warm and dry.     Capillary Refill: Capillary refill takes less than 2 seconds.  Neurological:     Mental Status: She is alert and oriented to person, place, and time.  Psychiatric:        Behavior: Behavior normal.      Musculoskeletal Exam: Cervical, thoracic and lumbar spine were in good range of motion.  There was no SI joint tenderness.  Shoulder joints, elbow joints, wrist joints, MCPs, PIPs and DIPs were in good range of motion with no synovitis.  Hip joints and knee joints were in good range of motion without any warmth swelling or effusion.  She had crepitus in bilateral knee joints.  There was no tenderness over ankles or MTPs.   CDAI Exam: CDAI Score: -- Patient Global: --; Provider Global: -- Swollen: --; Tender: -- Joint Exam 02/13/2024   No joint exam has been documented for this visit   There is currently no information documented on the homunculus. Go to the Rheumatology activity and complete the homunculus joint exam.  Investigation: No additional findings.  Imaging: No results found.  Recent Labs: Lab Results  Component Value Date   WBC 10.7 01/21/2024   HGB 14.3 01/21/2024   PLT 198 01/21/2024   NA 134 (L) 01/21/2024   K 3.9 01/21/2024   CL 94 (L) 01/21/2024   CO2 29 01/21/2024   GLUCOSE 102 (H) 01/21/2024   BUN 21 01/21/2024   CREATININE 1.04 (H)  01/21/2024   BILITOT 0.5 01/21/2024   ALKPHOS 67 12/22/2022   AST 18 01/21/2024   ALT 11 01/21/2024   PROT 7.5 01/21/2024   ALBUMIN  3.7 12/22/2022   CALCIUM 9.2 01/21/2024   GFRAA 52 (L) 06/16/2020  [facility] NEGATIVE 08/28/2023   January 21, 2024 ANCA 1: 40, IgE 5  Speciality Comments: No specialty comments available.  Procedures:  No procedures performed Allergies: Erythromycin, Penicillins, Gadolinium derivatives, Iodinated contrast media, Latex, Morphine, Other, Prednisone , Sulfonamide derivatives, Varenicline tartrate, Isosorbide , Aspirin , and Atorvastatin   Assessment / Plan:     Visit Diagnoses: Seronegative rheumatoid arthritis (HCC)-she had no synovitis on the examination.  Her rheumatoid arthritis is very well-controlled on Enbrel  50 mg subcu weekly.  She had some discomfort in her hands due to underlying osteoarthritis.  She is getting workup by Dr. Mcquaid for possible infection in her lung.  I advised her to hold off Enbrel  until the infection is treated.  If she starts having symptoms of rheumatoid arthritis then she can restart Enbrel  after discussing with Dr. Alaine.  High risk medication use - Enbrel  50 mg sq injections once weekly-started on 11/05/19.  (D/c MTX 0.4 ml sq wkly inj-low dose- elevated creatinine and recurrent bronchitis).  January 21, 2024 CBC and CMP was stable with creatinine mildly elevated at 1.04.  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  Digital mucinous cyst - Surgically excised by Dr. Meredeth recurrence.  Ganglion cyst - Volar aspect of left wrist- Dr. Laquita excised. No recurrence.  DDD (degenerative disc disease), cervical- some limitation on lateral rotation was noted.  Degeneration of intervertebral disc of lumbar region without discogenic back pain or lower extremity pain-she denies any discomfort today.  Fibromyalgia-she states the myalgias are manageable.  Other medical problems are listed as follows:  History of  diabetic gastroparesis  History of type 2 diabetes mellitus  Shortness of breath-patient states she has been struggling from shortness of breath for the last several months.  She has been under care of Dr. Alaine.  She had recent chest x-ray followed by high-resolution CT with questionable multifocal pneumonia versus organizing pneumonia.  She is on doxycycline currently.  ANCA was low titer positive which was not significant.  History of COPD-patient states her COPD symptoms got worse after COVID-19 virus infection.  Chronic diarrhea  Neuropathy  History of hyperlipidemia  Essential hypertension-pressure was normal today 137/82.  History of depression  History of hypothyroidism  B12 deficiency  Smoker  History of asthma  Orders: No orders of the defined types were placed in this encounter.  No orders of the defined types were placed in this encounter.    Follow-Up Instructions: Return in about 5 months (around 07/13/2024) for Rheumatoid arthritis.   Maya Nash, MD  Note - This record has been created using Animal nutritionist.  Chart creation errors have been sought, but may not always  have been located. Such creation errors do not reflect on  the standard of medical care.

## 2024-02-05 ENCOUNTER — Other Ambulatory Visit (HOSPITAL_COMMUNITY): Payer: Self-pay | Admitting: Respiratory Therapy

## 2024-02-11 ENCOUNTER — Inpatient Hospital Stay (HOSPITAL_COMMUNITY)
Admission: RE | Admit: 2024-02-11 | Discharge: 2024-02-11 | Disposition: A | Source: Ambulatory Visit | Attending: Cardiology

## 2024-02-11 DIAGNOSIS — R0602 Shortness of breath: Secondary | ICD-10-CM

## 2024-02-11 LAB — PULMONARY FUNCTION TEST
DL/VA % pred: 60 %
DL/VA: 2.45 ml/min/mmHg/L
DLCO cor % pred: 35 %
DLCO cor: 6.91 ml/min/mmHg
DLCO unc % pred: 36 %
DLCO unc: 7.09 ml/min/mmHg
FEF 25-75 Post: 0.49 L/s
FEF 25-75 Pre: 0.37 L/s
FEF2575-%Change-Post: 32 %
FEF2575-%Pred-Post: 32 %
FEF2575-%Pred-Pre: 24 %
FEV1-%Change-Post: 14 %
FEV1-%Pred-Post: 45 %
FEV1-%Pred-Pre: 39 %
FEV1-Post: 0.94 L
FEV1-Pre: 0.82 L
FEV1FVC-%Change-Post: 5 %
FEV1FVC-%Pred-Pre: 77 %
FEV6-%Change-Post: 6 %
FEV6-%Pred-Post: 55 %
FEV6-%Pred-Pre: 52 %
FEV6-Post: 1.47 L
FEV6-Pre: 1.38 L
FEV6FVC-%Change-Post: -2 %
FEV6FVC-%Pred-Post: 100 %
FEV6FVC-%Pred-Pre: 102 %
FVC-%Change-Post: 8 %
FVC-%Pred-Post: 55 %
FVC-%Pred-Pre: 51 %
FVC-Post: 1.54 L
FVC-Pre: 1.42 L
Post FEV1/FVC ratio: 61 %
Post FEV6/FVC ratio: 95 %
Pre FEV1/FVC ratio: 58 %
Pre FEV6/FVC Ratio: 97 %
RV % pred: 226 %
RV: 5.48 L
TLC % pred: 137 %
TLC: 7.14 L

## 2024-02-11 MED ORDER — ALBUTEROL SULFATE (2.5 MG/3ML) 0.083% IN NEBU
2.5000 mg | INHALATION_SOLUTION | Freq: Once | RESPIRATORY_TRACT | Status: AC
  Administered 2024-02-11: 2.5 mg via RESPIRATORY_TRACT

## 2024-02-12 ENCOUNTER — Ambulatory Visit: Payer: Self-pay | Admitting: Cardiology

## 2024-02-13 ENCOUNTER — Ambulatory Visit: Attending: Rheumatology | Admitting: Rheumatology

## 2024-02-13 ENCOUNTER — Encounter: Payer: Self-pay | Admitting: Rheumatology

## 2024-02-13 VITALS — BP 137/82 | HR 77 | Temp 97.0°F | Resp 18 | Ht 65.0 in | Wt 113.4 lb

## 2024-02-13 DIAGNOSIS — F17200 Nicotine dependence, unspecified, uncomplicated: Secondary | ICD-10-CM

## 2024-02-13 DIAGNOSIS — Z8639 Personal history of other endocrine, nutritional and metabolic disease: Secondary | ICD-10-CM

## 2024-02-13 DIAGNOSIS — M6740 Ganglion, unspecified site: Secondary | ICD-10-CM

## 2024-02-13 DIAGNOSIS — M67449 Ganglion, unspecified hand: Secondary | ICD-10-CM

## 2024-02-13 DIAGNOSIS — L930 Discoid lupus erythematosus: Secondary | ICD-10-CM | POA: Diagnosis not present

## 2024-02-13 DIAGNOSIS — K529 Noninfective gastroenteritis and colitis, unspecified: Secondary | ICD-10-CM

## 2024-02-13 DIAGNOSIS — I10 Essential (primary) hypertension: Secondary | ICD-10-CM

## 2024-02-13 DIAGNOSIS — Z8659 Personal history of other mental and behavioral disorders: Secondary | ICD-10-CM

## 2024-02-13 DIAGNOSIS — G629 Polyneuropathy, unspecified: Secondary | ICD-10-CM

## 2024-02-13 DIAGNOSIS — M797 Fibromyalgia: Secondary | ICD-10-CM

## 2024-02-13 DIAGNOSIS — R0602 Shortness of breath: Secondary | ICD-10-CM | POA: Diagnosis not present

## 2024-02-13 DIAGNOSIS — E538 Deficiency of other specified B group vitamins: Secondary | ICD-10-CM

## 2024-02-13 DIAGNOSIS — M51369 Other intervertebral disc degeneration, lumbar region without mention of lumbar back pain or lower extremity pain: Secondary | ICD-10-CM

## 2024-02-13 DIAGNOSIS — Z79899 Other long term (current) drug therapy: Secondary | ICD-10-CM

## 2024-02-13 DIAGNOSIS — M0600 Rheumatoid arthritis without rheumatoid factor, unspecified site: Secondary | ICD-10-CM | POA: Diagnosis not present

## 2024-02-13 DIAGNOSIS — M5030 Other cervical disc degeneration, unspecified cervical region: Secondary | ICD-10-CM

## 2024-02-13 DIAGNOSIS — Z8709 Personal history of other diseases of the respiratory system: Secondary | ICD-10-CM

## 2024-02-13 NOTE — Telephone Encounter (Signed)
 Received notification from [employer] regarding a prior authorization for ENBREL . Authorization has been APPROVED from 02/13/2024 to 08/13/2024. Approval letter sent to scan center.  Authorization # [id number]  Sherry Pennant, PharmD, MPH, BCPS, CPP Clinical Pharmacist

## 2024-02-13 NOTE — Telephone Encounter (Signed)
 Submitted a Prior Authorization renewal request to [facility] ADVANTAGE/RX ADVANCE for ENBREL  via CoverMyMeds. Will update once we receive a response.  Key: [id number]

## 2024-02-13 NOTE — Patient Instructions (Addendum)
 Standing Labs We placed an order today for your standing lab work.   Please have your standing labs drawn in February and every 3 months  Please have your labs drawn 2 weeks prior to your appointment so that the provider can discuss your lab results at your appointment, if possible.  Please note that you may see your imaging and lab results in MyChart before we have reviewed them. We will contact you once all results are reviewed. Please allow our office up to 72 hours to thoroughly review all of the results before contacting the office for clarification of your results.  WALK-IN LAB HOURS  Monday through Thursday from 8:00 am - 4:30 pm and Friday from 8:00 am-12:00 pm.  Patients with office visits requiring labs will be seen before walk-in labs.  You may encounter longer than normal wait times. Please allow additional time. Wait times may be shorter on  Monday and Thursday afternoons.  We do not book appointments for walk-in labs. We appreciate your patience and understanding with our staff.   Labs are drawn by [facility]. Please bring your co-pay at the time of your lab draw.  You may receive a bill from [facility] for your lab work.  Please note if you are on Hydroxychloroquine and and an order has been placed for a Hydroxychloroquine level,  you will need to have it drawn 4 hours or more after your last dose.  If you wish to have your labs drawn at another location, please call the office 24 hours in advance so we can fax the orders.  The office is located at [address], [address], [place], [name] [address]   If you have any questions regarding directions or hours of operation,  please call [phone].   As a reminder, please drink plenty of water prior to coming for your lab work. Thanks!   Vaccines You are taking a medication(s) that can suppress your immune system.  The following immunizations are recommended: Flu annually Covid-19 RSV Td/Tdap (tetanus, diphtheria, pertussis)  every 10 years Pneumonia (Prevnar 15 then Pneumovax 23  at least 1 year apart.  Alternatively, can take Prevnar 20 without needing additional dose) Shingrix: 2 doses from 4 weeks to 6 months apart  Please check with your PCP to make sure you are up to date.   If you have signs or symptoms of an infection or start antibiotics: First, call your PCP for workup of your infection. Hold your medication through the infection, until you complete your antibiotics, and until symptoms resolve if you take the following: Injectable medication (Actemra, Benlysta, Cimzia, Cosentyx, Enbrel , Humira, Kevzara, Orencia, Remicade, Simponi, Stelara, Taltz, Tremfya) Methotrexate  Leflunomide (Arava) Mycophenolate (Cellcept) Earma, Olumiant, or Rinvoq  Please get an annual skin examination to screen for skin cancer while you are on Enbrel .  Please use sunscreen and sun protection.

## 2024-02-18 NOTE — Telephone Encounter (Signed)
 Submitted Patient Assistance renewal Application to [employer] for ENBREL  with patient portion, provider portion, insurance card copy, prior authorization approval, and current medication list. Will update patient when we receive a response.  Phone #: [phone] Fax #: [phone]

## 2024-02-21 NOTE — Progress Notes (Signed)
 Called and spoke with patient regarding her question on Repatha .  Patient verbalized understanding. No other questions or concerns.

## 2024-03-03 NOTE — Telephone Encounter (Signed)
 Per automated system at [employer], all 2026 applications will be processed in 2026 (determination will be made no sooner than 2026)

## 2024-03-09 ENCOUNTER — Other Ambulatory Visit (HOSPITAL_COMMUNITY): Payer: Self-pay

## 2024-03-09 DIAGNOSIS — I2510 Atherosclerotic heart disease of native coronary artery without angina pectoris: Secondary | ICD-10-CM

## 2024-03-09 DIAGNOSIS — E785 Hyperlipidemia, unspecified: Secondary | ICD-10-CM

## 2024-03-10 ENCOUNTER — Other Ambulatory Visit (HOSPITAL_COMMUNITY): Payer: Self-pay

## 2024-03-10 ENCOUNTER — Telehealth: Payer: Self-pay | Admitting: Pharmacy Technician

## 2024-03-10 MED ORDER — REPATHA SURECLICK 140 MG/ML ~~LOC~~ SOAJ
140.0000 mg | SUBCUTANEOUS | 3 refills | Status: AC
  Filled 2024-03-10: qty 6, 84d supply, fill #0

## 2024-03-10 NOTE — Telephone Encounter (Signed)
 Pharmacy Patient Advocate Encounter   Received notification from [facility] that prior authorization for REPATHAis required/requested.   Insurance verification completed.   The patient is insured through [facility] ADVANTAGE/RX ADVANCE.   Per test claim: PA required; PA submitted to above mentioned insurance via Latent Key/confirmation #/EOC [employer] Status is pending   Patient Advocate Encounter   The patient was approved for a [employer] grant that will help cover the cost of repatha -renewed Total amount awarded, 2500.  Effective: 02/09/24 - 02/07/25   APW:[id number] ERW:[id number] Hmnle:[id number] PI:[id number] [employer] ID: [id number]   Pharmacy provided with approval and processing information. Patient informed via mychart   In [employer]

## 2024-03-10 NOTE — Telephone Encounter (Signed)
 Pharmacy Patient Advocate Encounter  Received notification from [employer] that Prior Authorization for repatha  has been APPROVED from 03/10/24 to 03/10/25   PA #/Case ID/Reference #: [id number]

## 2024-03-25 NOTE — Telephone Encounter (Signed)
 Per [email or web address]://[email or web address]/status, application is missing information that patient was potentially contacted about. There is option to upload documents but unclear what exactly is required. MyChart message sent to patient  Sherry Pennant, PharmD, MPH, BCPS, CPP Clinical Pharmacist

## 2024-04-01 ENCOUNTER — Other Ambulatory Visit: Payer: Self-pay | Admitting: Surgery

## 2024-04-01 DIAGNOSIS — G9001 Carotid sinus syncope: Secondary | ICD-10-CM

## 2024-04-09 ENCOUNTER — Ambulatory Visit (HOSPITAL_COMMUNITY)

## 2024-04-22 ENCOUNTER — Ambulatory Visit (HOSPITAL_COMMUNITY)

## 2024-05-04 ENCOUNTER — Ambulatory Visit: Admitting: Surgery

## 2024-05-27 ENCOUNTER — Encounter (INDEPENDENT_AMBULATORY_CARE_PROVIDER_SITE_OTHER): Admitting: Ophthalmology

## 2024-06-10 ENCOUNTER — Ambulatory Visit: Admitting: Cardiology

## 2024-07-14 ENCOUNTER — Ambulatory Visit: Admitting: Rheumatology
